# Patient Record
Sex: Female | Born: 1955
Health system: Southern US, Community
[De-identification: ages and names within clinical notes are randomized; demographics above are authoritative.]

## PROBLEM LIST (undated history)

## (undated) DIAGNOSIS — F419 Anxiety disorder, unspecified: Secondary | ICD-10-CM

## (undated) DIAGNOSIS — Z8719 Personal history of other diseases of the digestive system: Secondary | ICD-10-CM

## (undated) DIAGNOSIS — T8859XA Other complications of anesthesia, initial encounter: Secondary | ICD-10-CM

## (undated) DIAGNOSIS — Z8711 Personal history of peptic ulcer disease: Secondary | ICD-10-CM

## (undated) DIAGNOSIS — M5136 Other intervertebral disc degeneration, lumbar region: Secondary | ICD-10-CM

## (undated) DIAGNOSIS — Z923 Personal history of irradiation: Secondary | ICD-10-CM

## (undated) DIAGNOSIS — N816 Rectocele: Secondary | ICD-10-CM

## (undated) DIAGNOSIS — E039 Hypothyroidism, unspecified: Secondary | ICD-10-CM

## (undated) DIAGNOSIS — I10 Essential (primary) hypertension: Secondary | ICD-10-CM

## (undated) DIAGNOSIS — T83711A Erosion of implanted vaginal mesh and other prosthetic materials to surrounding organ or tissue, initial encounter: Secondary | ICD-10-CM

## (undated) DIAGNOSIS — C50919 Malignant neoplasm of unspecified site of unspecified female breast: Secondary | ICD-10-CM

## (undated) DIAGNOSIS — E119 Type 2 diabetes mellitus without complications: Secondary | ICD-10-CM

## (undated) DIAGNOSIS — M797 Fibromyalgia: Secondary | ICD-10-CM

## (undated) DIAGNOSIS — T4145XA Adverse effect of unspecified anesthetic, initial encounter: Secondary | ICD-10-CM

## (undated) DIAGNOSIS — M51369 Other intervertebral disc degeneration, lumbar region without mention of lumbar back pain or lower extremity pain: Secondary | ICD-10-CM

## (undated) DIAGNOSIS — K219 Gastro-esophageal reflux disease without esophagitis: Secondary | ICD-10-CM

## (undated) HISTORY — PX: CARDIOVASCULAR STRESS TEST: SHX262

## (undated) HISTORY — PX: VEIN BYPASS SURGERY: SHX833

## (undated) HISTORY — DX: Malignant neoplasm of unspecified site of unspecified female breast: C50.919

## (undated) HISTORY — PX: INCONTINENCE SURGERY: SHX676

---

## 1983-11-28 HISTORY — PX: OTHER SURGICAL HISTORY: SHX169

## 1998-11-27 HISTORY — PX: VAGINAL HYSTERECTOMY: SUR661

## 2001-11-27 HISTORY — PX: OTHER SURGICAL HISTORY: SHX169

## 2004-11-29 ENCOUNTER — Emergency Department (HOSPITAL_COMMUNITY): Admission: EM | Admit: 2004-11-29 | Discharge: 2004-11-29 | Payer: Self-pay | Admitting: Emergency Medicine

## 2005-07-18 ENCOUNTER — Other Ambulatory Visit: Admission: RE | Admit: 2005-07-18 | Discharge: 2005-07-18 | Payer: Self-pay | Admitting: Gynecology

## 2009-12-23 ENCOUNTER — Ambulatory Visit (HOSPITAL_COMMUNITY): Admission: RE | Admit: 2009-12-23 | Discharge: 2009-12-23 | Payer: Self-pay | Admitting: Internal Medicine

## 2010-08-23 ENCOUNTER — Ambulatory Visit (HOSPITAL_COMMUNITY): Admission: RE | Admit: 2010-08-23 | Discharge: 2010-08-23 | Payer: Self-pay | Admitting: Urology

## 2010-08-29 ENCOUNTER — Ambulatory Visit (HOSPITAL_BASED_OUTPATIENT_CLINIC_OR_DEPARTMENT_OTHER): Admission: RE | Admit: 2010-08-29 | Discharge: 2010-08-29 | Payer: Self-pay | Admitting: Urology

## 2010-08-29 HISTORY — PX: OTHER SURGICAL HISTORY: SHX169

## 2011-02-09 LAB — POCT I-STAT, CHEM 8
BUN: 11 mg/dL (ref 6–23)
Calcium, Ion: 1.17 mmol/L (ref 1.12–1.32)
Hemoglobin: 14.6 g/dL (ref 12.0–15.0)
Potassium: 3 mEq/L — ABNORMAL LOW (ref 3.5–5.1)

## 2011-09-21 DIAGNOSIS — R159 Full incontinence of feces: Secondary | ICD-10-CM | POA: Insufficient documentation

## 2011-09-21 DIAGNOSIS — IMO0001 Reserved for inherently not codable concepts without codable children: Secondary | ICD-10-CM | POA: Insufficient documentation

## 2011-09-21 DIAGNOSIS — IMO0002 Reserved for concepts with insufficient information to code with codable children: Secondary | ICD-10-CM | POA: Insufficient documentation

## 2011-09-21 DIAGNOSIS — F529 Unspecified sexual dysfunction not due to a substance or known physiological condition: Secondary | ICD-10-CM | POA: Insufficient documentation

## 2012-08-09 ENCOUNTER — Other Ambulatory Visit: Payer: Self-pay | Admitting: Obstetrics and Gynecology

## 2012-09-18 ENCOUNTER — Emergency Department (HOSPITAL_COMMUNITY): Payer: No Typology Code available for payment source

## 2012-09-18 ENCOUNTER — Encounter (HOSPITAL_COMMUNITY): Payer: Self-pay

## 2012-09-18 ENCOUNTER — Emergency Department (HOSPITAL_COMMUNITY)
Admission: EM | Admit: 2012-09-18 | Discharge: 2012-09-18 | Disposition: A | Discharge status: Home / Self Care | Payer: No Typology Code available for payment source | Attending: Emergency Medicine | Admitting: Emergency Medicine

## 2012-09-18 DIAGNOSIS — I10 Essential (primary) hypertension: Secondary | ICD-10-CM | POA: Insufficient documentation

## 2012-09-18 DIAGNOSIS — Z8639 Personal history of other endocrine, nutritional and metabolic disease: Secondary | ICD-10-CM | POA: Insufficient documentation

## 2012-09-18 DIAGNOSIS — Y9389 Activity, other specified: Secondary | ICD-10-CM | POA: Insufficient documentation

## 2012-09-18 DIAGNOSIS — Z862 Personal history of diseases of the blood and blood-forming organs and certain disorders involving the immune mechanism: Secondary | ICD-10-CM | POA: Insufficient documentation

## 2012-09-18 DIAGNOSIS — S060X1A Concussion with loss of consciousness of 30 minutes or less, initial encounter: Secondary | ICD-10-CM | POA: Insufficient documentation

## 2012-09-18 DIAGNOSIS — S0990XA Unspecified injury of head, initial encounter: Secondary | ICD-10-CM

## 2012-09-18 DIAGNOSIS — Z8739 Personal history of other diseases of the musculoskeletal system and connective tissue: Secondary | ICD-10-CM | POA: Insufficient documentation

## 2012-09-18 HISTORY — DX: Essential (primary) hypertension: I10

## 2012-09-18 HISTORY — DX: Fibromyalgia: M79.7

## 2012-09-18 MED ORDER — ACETAMINOPHEN 325 MG PO TABS
650.0000 mg | ORAL_TABLET | Freq: Once | ORAL | Status: AC
  Administered 2012-09-18: 650 mg via ORAL
  Filled 2012-09-18: qty 2

## 2012-09-18 NOTE — ED Notes (Addendum)
Pt log-rolled off backboard with assistance of 3 RNs. C-collar left in place.

## 2012-09-18 NOTE — ED Notes (Signed)
C-collar taken off by MD.

## 2012-09-18 NOTE — ED Notes (Signed)
MD at bedside. 

## 2012-09-18 NOTE — ED Notes (Signed)
Pt reporting back to left side of her forehead, neck and lower back.
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
## 2013-02-07 ENCOUNTER — Other Ambulatory Visit: Payer: Self-pay | Admitting: Family Medicine

## 2013-02-07 DIAGNOSIS — M542 Cervicalgia: Secondary | ICD-10-CM

## 2013-02-10 ENCOUNTER — Other Ambulatory Visit: Payer: Self-pay | Admitting: Family Medicine

## 2013-02-10 DIAGNOSIS — M545 Low back pain, unspecified: Secondary | ICD-10-CM

## 2013-02-10 DIAGNOSIS — M542 Cervicalgia: Secondary | ICD-10-CM

## 2013-02-15 ENCOUNTER — Ambulatory Visit
Admission: RE | Admit: 2013-02-15 | Discharge: 2013-02-15 | Disposition: A | Discharge status: Home / Self Care | Payer: Self-pay | Source: Ambulatory Visit | Attending: Family Medicine | Admitting: Family Medicine

## 2013-02-15 DIAGNOSIS — M545 Low back pain, unspecified: Secondary | ICD-10-CM

## 2013-02-15 DIAGNOSIS — M542 Cervicalgia: Secondary | ICD-10-CM

## 2013-06-06 ENCOUNTER — Ambulatory Visit (INDEPENDENT_AMBULATORY_CARE_PROVIDER_SITE_OTHER): Payer: Medicare Other | Admitting: Internal Medicine

## 2013-06-06 VITALS — BP 112/73 | HR 72 | Ht 63.0 in | Wt 153.0 lb

## 2013-06-06 DIAGNOSIS — R0789 Other chest pain: Secondary | ICD-10-CM

## 2013-06-06 DIAGNOSIS — R0602 Shortness of breath: Secondary | ICD-10-CM

## 2013-06-06 NOTE — Patient Instructions (Addendum)
Your physician has requested that you have an echocardiogram. Echocardiography is a painless test that uses sound waves to create images of your heart. It provides your doctor with information about the size and shape of your heart and how well your heart's chambers and valves are working. This procedure takes approximately one hour. There are no restrictions for this procedure.  Your physician recommends that you continue on your current medications as directed. Please refer to the Current Medication list given to you today.  

## 2013-06-09 NOTE — Progress Notes (Signed)
HPI Patient is a 15 yho who is referred by Dr Oneta Rack for evaluation of CP The patient has no history of CAD  She says her CP comes and goes  Does feel light her chest is tight.  Accomp by some SOB  No definite association with activity Also complains fo legs/calves cramping and sore.  This started near end of year.  Her legs when layiing down wll Allergies  Allergen Reactions  . Shellfish Allergy Diarrhea and Nausea And Vomiting    Current Outpatient Prescriptions  Medication Sig Dispense Refill  . ALPRAZolam (XANAX) 0.5 MG tablet Take 0.5 mg by mouth at bedtime as needed for sleep.      . B Complex-C (SUPER B COMPLEX PO) Take 1 tablet by mouth continuous dialysis.      . Cholecalciferol (VITAMIN D-3) 5000 UNITS TABS Take 1 tablet by mouth daily.      . folic acid (FOLVITE) 800 MCG tablet 2 tabs po qd      . hydrochlorothiazide (HYDRODIURIL) 25 MG tablet Take 12.5 mg by mouth daily.       Marland Kitchen levothyroxine (SYNTHROID, LEVOTHROID) 88 MCG tablet Take 44-88 mcg by mouth daily. Take half a tablet Monday, Wednesday and Friday. Take whole tablet all other days      . meloxicam (MOBIC) 7.5 MG tablet Take 7.5 mg by mouth daily.      . potassium chloride SA (K-DUR,KLOR-CON) 20 MEQ tablet Take 20 mEq by mouth as needed.       . ranitidine (ZANTAC) 300 MG capsule 1 or 2 tabs po qd      . rosuvastatin (CRESTOR) 20 MG tablet Take 10 mg by mouth daily.      . triazolam (HALCION) 0.125 MG tablet Take 0.125 mg by mouth at bedtime as needed. For insomnia       No current facility-administered medications for this visit.    Past Medical History  Diagnosis Date  . Hypertension   . Thyroid disease   . Fibromyalgia     Past Surgical History  Procedure Laterality Date  . Bladder surgery      No family history on file.  History   Social History  . Marital Status: Married    Spouse Name: N/A    Number of Children: N/A  . Years of Education: N/A   Occupational History  . Not on file.    Social History Main Topics  . Smoking status: Never Smoker   . Smokeless tobacco: Not on file  . Alcohol Use: No  . Drug Use:   . Sexually Active:    Other Topics Concern  . Not on file   Social History Narrative  . No narrative on file    Review of Systems:  All systems reviewed.  They are negative to the above problem except as previously stated.  Vital Signs: BP 112/73  Pulse 72  Ht 5\' 3"  (1.6 m)  Wt 153 lb (69.4 kg)  BMI 27.11 kg/m2  Physical Exam Patient is in NAD HEENT:  Normocephalic, atraumatic. EOMI, PERRLA.  Neck: JVP is normal.  No bruits.  Lungs: clear to auscultation. No rales no wheezes.  Heart: Regular rate and rhythm. Normal S1, S2. No S3.   No significant murmurs. PMI not displaced.  Abdomen:  Supple, nontender. Normal bowel sounds. No masses. No hepatomegaly.  Extremities:   Good distal pulses throughout. No lower extremity edema.  Musculoskeletal :moving all extremities.  Neuro:   alert and oriented x3.  CN II-XII  grossly intact.   Assessment and Plan:  1.  Chest pain.  I am not convinced pain represent angina.  With SOB I would recomm starting with an echo to evaluate  2.  HTN   GOod control  3.  Lipids  LDL in Jan was 113, Trig 169.  Follow

## 2013-06-16 ENCOUNTER — Ambulatory Visit (HOSPITAL_COMMUNITY): Payer: Medicare Other | Attending: Cardiology | Admitting: Radiology

## 2013-06-16 DIAGNOSIS — R0989 Other specified symptoms and signs involving the circulatory and respiratory systems: Secondary | ICD-10-CM | POA: Insufficient documentation

## 2013-06-16 DIAGNOSIS — R0609 Other forms of dyspnea: Secondary | ICD-10-CM | POA: Insufficient documentation

## 2013-06-16 DIAGNOSIS — R0602 Shortness of breath: Secondary | ICD-10-CM

## 2013-06-16 DIAGNOSIS — R0789 Other chest pain: Secondary | ICD-10-CM | POA: Insufficient documentation

## 2013-06-16 DIAGNOSIS — I10 Essential (primary) hypertension: Secondary | ICD-10-CM | POA: Insufficient documentation

## 2013-06-16 HISTORY — PX: TRANSTHORACIC ECHOCARDIOGRAM: SHX275

## 2013-06-16 NOTE — Progress Notes (Signed)
Echocardiogram performed.  

## 2013-06-25 ENCOUNTER — Telehealth: Payer: Self-pay | Admitting: *Deleted

## 2013-06-25 DIAGNOSIS — R079 Chest pain, unspecified: Secondary | ICD-10-CM

## 2013-06-25 DIAGNOSIS — R0600 Dyspnea, unspecified: Secondary | ICD-10-CM

## 2013-06-25 NOTE — Telephone Encounter (Signed)
I spoke with pt this morning about her echo results. She has been scheduled for a stress myoview on 07/02/13 at 9:30am Pt is aware Mylo Red RN

## 2013-06-25 NOTE — Telephone Encounter (Signed)
Message copied by Barrie Folk on Wed Jun 25, 2013 10:22 AM ------      Message from: Dietrich Pates V      Created: Wed Jun 25, 2013  9:01 AM       Echo is overall normal.        I would recomm setting up for stress myoview to make sure that CP/SOB not related to blood flow problem.      Patient should be set up for exercise myoview. ------

## 2013-07-02 ENCOUNTER — Ambulatory Visit (HOSPITAL_COMMUNITY): Payer: Medicare Other | Attending: Cardiology | Admitting: Radiology

## 2013-07-02 ENCOUNTER — Other Ambulatory Visit (HOSPITAL_COMMUNITY): Payer: Self-pay | Admitting: Internal Medicine

## 2013-07-02 VITALS — BP 147/86 | HR 65 | Ht 63.0 in | Wt 152.0 lb

## 2013-07-02 DIAGNOSIS — I739 Peripheral vascular disease, unspecified: Secondary | ICD-10-CM | POA: Insufficient documentation

## 2013-07-02 DIAGNOSIS — R55 Syncope and collapse: Secondary | ICD-10-CM | POA: Insufficient documentation

## 2013-07-02 DIAGNOSIS — Z8249 Family history of ischemic heart disease and other diseases of the circulatory system: Secondary | ICD-10-CM | POA: Insufficient documentation

## 2013-07-02 DIAGNOSIS — I10 Essential (primary) hypertension: Secondary | ICD-10-CM | POA: Insufficient documentation

## 2013-07-02 DIAGNOSIS — R110 Nausea: Secondary | ICD-10-CM | POA: Insufficient documentation

## 2013-07-02 DIAGNOSIS — R5383 Other fatigue: Secondary | ICD-10-CM | POA: Insufficient documentation

## 2013-07-02 DIAGNOSIS — R5381 Other malaise: Secondary | ICD-10-CM | POA: Insufficient documentation

## 2013-07-02 DIAGNOSIS — R002 Palpitations: Secondary | ICD-10-CM | POA: Insufficient documentation

## 2013-07-02 DIAGNOSIS — R0989 Other specified symptoms and signs involving the circulatory and respiratory systems: Secondary | ICD-10-CM | POA: Insufficient documentation

## 2013-07-02 DIAGNOSIS — R42 Dizziness and giddiness: Secondary | ICD-10-CM | POA: Insufficient documentation

## 2013-07-02 DIAGNOSIS — Z87891 Personal history of nicotine dependence: Secondary | ICD-10-CM | POA: Insufficient documentation

## 2013-07-02 DIAGNOSIS — R079 Chest pain, unspecified: Secondary | ICD-10-CM | POA: Insufficient documentation

## 2013-07-02 DIAGNOSIS — R0609 Other forms of dyspnea: Secondary | ICD-10-CM | POA: Insufficient documentation

## 2013-07-02 DIAGNOSIS — R0602 Shortness of breath: Secondary | ICD-10-CM

## 2013-07-02 DIAGNOSIS — R0600 Dyspnea, unspecified: Secondary | ICD-10-CM

## 2013-07-02 DIAGNOSIS — R109 Unspecified abdominal pain: Secondary | ICD-10-CM

## 2013-07-02 MED ORDER — TECHNETIUM TC 99M SESTAMIBI GENERIC - CARDIOLITE
10.0000 | Freq: Once | INTRAVENOUS | Status: AC | PRN
  Administered 2013-07-02: 10 via INTRAVENOUS

## 2013-07-02 MED ORDER — TECHNETIUM TC 99M SESTAMIBI GENERIC - CARDIOLITE
30.0000 | Freq: Once | INTRAVENOUS | Status: AC | PRN
  Administered 2013-07-02: 30 via INTRAVENOUS

## 2013-07-02 NOTE — Progress Notes (Signed)
[facility] SITE 3 NUCLEAR MED [address] [place], [place] [address] [phone]    Cardiology Nuclear Med Study  Desiree Anderson is a 57 y.o. female     MRN : [id number]     DOB: 05-Jun-1956  Procedure Date: 07/02/2013  Nuclear Med Background Indication for Stress Test:  Evaluation for Ischemia History:  06/16/13 Echo:EF=60% Cardiac Risk Factors: Claudication, Family History - CAD, History of Smoking, Hypertension and Lipids  Symptoms:  Chest Tightness with and without Exertion (last episode of chest discomfort was yesterday), Diaphoresis, Dizziness, DOE, Fatigue, Nausea, Near Syncope, Palpitations and Rapid HR   Nuclear Pre-Procedure Caffeine/Decaff Intake:  None NPO After: 6 pm   Lungs:  Clear. O2 Sat: 97% on room air. IV 0.9% NS with Angio Cath:  22g  IV Site: R Antecubital  IV Started by:  Bonnita Levan, RN  Chest Size (in):  36 Cup Size: DDDD  Height: 5\' 3"  (1.6 m)  Weight:  152 lb (68.947 kg)  BMI:  Body mass index is 26.93 kg/(m^2). Tech Comments:  N/A    Nuclear Med Study 1 or 2 day study: 1 day  Stress Test Type:  Stress  Reading MD: Marca Ancona, MD  Order Authorizing Provider:  Dietrich Pates, MD  Resting Radionuclide: Technetium 37m Sestamibi  Resting Radionuclide Dose: 11.0 mCi   Stress Radionuclide:  Technetium 13m Sestamibi  Stress Radionuclide Dose: 33.0 mCi           Stress Protocol Rest HR: 82 Stress HR: 146  Rest BP: 125/88 Stress BP: 168/91  Exercise Time (min): 7:00 METS: 7.0   Predicted Max HR: 165 bpm % Max HR: 88.48 bpm Rate Pressure Product: 21308   Dose of Adenosine (mg):  n/a Dose of Lexiscan: n/a mg  Dose of Atropine (mg): n/a Dose of Dobutamine: n/a mcg/kg/min (at max HR)  Stress Test Technologist: Smiley Houseman, CMA-N  Nuclear Technologist:  Harlow Asa, CNMT     Rest Procedure:  Myocardial perfusion imaging was performed at rest 45 minutes following the intravenous administration of Technetium 22m Sestamibi.  Rest ECG: NSR  - Normal EKG  Stress Procedure:  The patient exercised on the treadmill utilizing the Bruce Protocol for seven minutes. The patient stopped due to fatigue and denied any chest pain.  Technetium 26m Sestamibi was injected at peak exercise and myocardial perfusion imaging was performed after a brief delay.  Stress ECG: Insignificant upsloping ST segment depression.  QPS Raw Data Images:  Normal; no motion artifact; normal heart/lung ratio. Stress Images:  Small, mild apical septal perfusion defect. Rest Images:  Small,mild apical septal perfusion defect. Subtraction (SDS):  Fixed small, mild apical septal perfusion defect.  Transient Ischemic Dilatation (Normal <1.22):  n/a Lung/Heart Ratio (Normal <0.45):  0.32  Quantitative Gated Spect Images QGS EDV:  39 ml QGS ESV:  10 ml  Impression Exercise Capacity:  Fair exercise capacity. BP Response:  Normal blood pressure response. Clinical Symptoms:  Dyspnea, fatigue.  ECG Impression:  Insignificant upsloping ST segment depression. Comparison with Prior Nuclear Study: No previous nuclear study performed  Overall Impression:  Low risk stress nuclear study with a small, mild apical septal fixed perfusion defect. Given normal wall motion, suspect this represents attenuation. .  LV Ejection Fraction: 74%.  LV Wall Motion:  NL LV Function; NL Wall Motion  Marca Ancona 07/02/2013

## 2013-07-03 ENCOUNTER — Other Ambulatory Visit: Payer: Self-pay | Admitting: Internal Medicine

## 2013-07-03 ENCOUNTER — Ambulatory Visit (HOSPITAL_COMMUNITY)
Admission: RE | Admit: 2013-07-03 | Discharge: 2013-07-03 | Disposition: A | Discharge status: Home / Self Care | Payer: Medicare Other | Source: Ambulatory Visit | Attending: Internal Medicine | Admitting: Internal Medicine

## 2013-07-03 DIAGNOSIS — R110 Nausea: Secondary | ICD-10-CM | POA: Insufficient documentation

## 2013-07-03 DIAGNOSIS — K8020 Calculus of gallbladder without cholecystitis without obstruction: Secondary | ICD-10-CM | POA: Insufficient documentation

## 2013-07-03 DIAGNOSIS — R109 Unspecified abdominal pain: Secondary | ICD-10-CM

## 2013-07-03 DIAGNOSIS — R932 Abnormal findings on diagnostic imaging of liver and biliary tract: Secondary | ICD-10-CM | POA: Insufficient documentation

## 2013-07-04 ENCOUNTER — Ambulatory Visit
Admission: RE | Admit: 2013-07-04 | Discharge: 2013-07-04 | Disposition: A | Discharge status: Home / Self Care | Payer: Medicare Other | Source: Ambulatory Visit | Attending: Internal Medicine | Admitting: Internal Medicine

## 2013-07-04 DIAGNOSIS — R109 Unspecified abdominal pain: Secondary | ICD-10-CM

## 2013-07-04 MED ORDER — IOHEXOL 300 MG/ML  SOLN
100.0000 mL | Freq: Once | INTRAMUSCULAR | Status: AC | PRN
  Administered 2013-07-04: 100 mL via INTRAVENOUS

## 2013-07-09 ENCOUNTER — Telehealth: Payer: Self-pay | Admitting: *Deleted

## 2013-07-09 NOTE — Telephone Encounter (Signed)
Advised patient of myoview

## 2013-07-09 NOTE — Telephone Encounter (Signed)
Message copied by Burnell Blanks on Wed Jul 09, 2013  9:18 AM ------      Message from: Dietrich Pates V      Created: Tue Jul 08, 2013  4:16 PM       Stress test is normal  CP does not appear to be due to blood flow problems to the heart. ------

## 2013-12-04 ENCOUNTER — Other Ambulatory Visit: Payer: Self-pay | Admitting: Internal Medicine

## 2013-12-10 ENCOUNTER — Ambulatory Visit (INDEPENDENT_AMBULATORY_CARE_PROVIDER_SITE_OTHER): Payer: Medicare Other | Admitting: Internal Medicine

## 2013-12-10 ENCOUNTER — Encounter: Payer: Self-pay | Admitting: Internal Medicine

## 2013-12-10 VITALS — BP 124/82 | HR 64 | Temp 98.1°F | Resp 18 | Wt 160.0 lb

## 2013-12-10 DIAGNOSIS — R7309 Other abnormal glucose: Secondary | ICD-10-CM

## 2013-12-10 DIAGNOSIS — B351 Tinea unguium: Secondary | ICD-10-CM

## 2013-12-10 DIAGNOSIS — R7303 Prediabetes: Secondary | ICD-10-CM | POA: Insufficient documentation

## 2013-12-10 DIAGNOSIS — E782 Mixed hyperlipidemia: Secondary | ICD-10-CM

## 2013-12-10 DIAGNOSIS — M797 Fibromyalgia: Secondary | ICD-10-CM

## 2013-12-10 DIAGNOSIS — M1990 Unspecified osteoarthritis, unspecified site: Secondary | ICD-10-CM | POA: Insufficient documentation

## 2013-12-10 DIAGNOSIS — M51369 Other intervertebral disc degeneration, lumbar region without mention of lumbar back pain or lower extremity pain: Secondary | ICD-10-CM

## 2013-12-10 DIAGNOSIS — E559 Vitamin D deficiency, unspecified: Secondary | ICD-10-CM

## 2013-12-10 DIAGNOSIS — M5136 Other intervertebral disc degeneration, lumbar region: Secondary | ICD-10-CM | POA: Insufficient documentation

## 2013-12-10 DIAGNOSIS — I10 Essential (primary) hypertension: Secondary | ICD-10-CM | POA: Insufficient documentation

## 2013-12-10 DIAGNOSIS — Z79899 Other long term (current) drug therapy: Secondary | ICD-10-CM

## 2013-12-10 LAB — HEPATIC FUNCTION PANEL
ALBUMIN: 4 g/dL (ref 3.5–5.2)
ALT: 24 U/L (ref 0–35)
AST: 18 U/L (ref 0–37)
Alkaline Phosphatase: 68 U/L (ref 39–117)
BILIRUBIN TOTAL: 0.4 mg/dL (ref 0.3–1.2)
Bilirubin, Direct: 0.1 mg/dL (ref 0.0–0.3)
Indirect Bilirubin: 0.3 mg/dL (ref 0.0–0.9)
Total Protein: 6.3 g/dL (ref 6.0–8.3)

## 2013-12-10 LAB — CBC WITH DIFFERENTIAL/PLATELET
BASOS PCT: 0 % (ref 0–1)
Basophils Absolute: 0 10*3/uL (ref 0.0–0.1)
EOS PCT: 2 % (ref 0–5)
Eosinophils Absolute: 0.1 10*3/uL (ref 0.0–0.7)
HCT: 39.4 % (ref 36.0–46.0)
Hemoglobin: 13.2 g/dL (ref 12.0–15.0)
LYMPHS ABS: 3.4 10*3/uL (ref 0.7–4.0)
LYMPHS PCT: 48 % — AB (ref 12–46)
MCH: 27.7 pg (ref 26.0–34.0)
MCHC: 33.5 g/dL (ref 30.0–36.0)
MCV: 82.8 fL (ref 78.0–100.0)
MONO ABS: 0.6 10*3/uL (ref 0.1–1.0)
Monocytes Relative: 9 % (ref 3–12)
Neutro Abs: 2.9 10*3/uL (ref 1.7–7.7)
Neutrophils Relative %: 41 % — ABNORMAL LOW (ref 43–77)
PLATELETS: 308 10*3/uL (ref 150–400)
RBC: 4.76 MIL/uL (ref 3.87–5.11)
RDW: 14.3 % (ref 11.5–15.5)
WBC: 7 10*3/uL (ref 4.0–10.5)

## 2013-12-10 LAB — LIPID PANEL
Cholesterol: 177 mg/dL (ref 0–200)
HDL: 59 mg/dL (ref >39)
LDL CALC: 100 mg/dL — AB (ref 0–99)
Total CHOL/HDL Ratio: 3 Ratio
Triglycerides: 92 mg/dL (ref <150)
VLDL: 18 mg/dL (ref 0–40)

## 2013-12-10 LAB — MAGNESIUM: Magnesium: 1.9 mg/dL (ref 1.5–2.5)

## 2013-12-10 LAB — BASIC METABOLIC PANEL WITH GFR
BUN: 14 mg/dL (ref 6–23)
CO2: 31 meq/L (ref 19–32)
CREATININE: 0.52 mg/dL (ref 0.50–1.10)
Calcium: 9.2 mg/dL (ref 8.4–10.5)
Chloride: 101 mEq/L (ref 96–112)
GFR, Est African American: >89 mL/min
GFR, Est Non African American: >89 mL/min
Glucose, Bld: 79 mg/dL (ref 70–99)
Potassium: 3.9 mEq/L (ref 3.5–5.3)
Sodium: 142 mEq/L (ref 135–145)

## 2013-12-10 LAB — TSH: TSH: 3.509 u[IU]/mL (ref 0.350–4.500)

## 2013-12-10 LAB — HEMOGLOBIN A1C
HEMOGLOBIN A1C: 5.8 % — AB (ref <5.7)
Mean Plasma Glucose: 120 mg/dL — ABNORMAL HIGH (ref <117)

## 2013-12-10 MED ORDER — TERBINAFINE HCL 250 MG PO TABS: 250.0000 mg | ORAL_TABLET | Freq: Every day | ORAL | Status: DC

## 2013-12-10 NOTE — Progress Notes (Signed)
Patient ID: Desiree Anderson, female   DOB: 02/27/1956, 58 y.o.   MRN: [id number]   This very nice 58 y.o. MWF presents for 3 month follow up with Hypertension, Hyperlipidemia, Pre-Diabetes and Vitamin D Deficiency.    HTN predates since 2004. BP has been controlled at home. Today's BP: 124/82 mmHg . Patient denies any cardiac type chest pain, palpitations, dyspnea/orthopnea/PND, dizziness, claudication, or dependent edema.   Hyperlipidemia is controlled with diet & meds. Last Cholesterol was 189, Triglycerides were 111, HDL 58 and LDL 109 in Oct 2014 - near goal. Patient denies myalgias or other med SE's.     Also, the patient has history of PreDiabetes with A1c 6.1 % in 04/2010 and with last A1c of 5.6 % in Oct 2014. Patient denies any symptoms of reactive hypoglycemia, diabetic polys, paresthesias or visual blurring.   Other problems include chronic Pain Syndrome due to DJD/DDD w/ chronic LBP, IC  and Fibromyalgia for which patient is on Disability. Today , she also c/o a thickened discolored chalky Rt 1st toenail.   Further, Patient has history of Vitamin D Deficiency  Of 11 in 2008 with last vitamin D of 54 in Oct 2014. Patient supplements vitamin D without any suspected side-effects.    Medication List       This list is accurate as of: 12/10/13  9:45 AM.  Always use your most recent med list.               ALPRAZolam 0.5 MG tablet  Commonly known as:  XANAX  Take 0.5 mg by mouth at bedtime as needed for sleep.     folic acid 151 MCG tablet  Commonly known as:  FOLVITE  2 tabs po qd     hydrochlorothiazide 25 MG tablet  Commonly known as:  HYDRODIURIL  Take 12.5 mg by mouth daily.     levothyroxine 88 MCG tablet  Commonly known as:  SYNTHROID, LEVOTHROID  Take 44-88 mcg by mouth daily. Take half a tablet Monday, Wednesday and Friday, sat and sun Take whole tablet all other days     meloxicam 7.5 MG tablet  Commonly known as:  MOBIC  Take 7.5 mg by mouth daily.     potassium  chloride SA 20 MEQ tablet  Commonly known as:  K-DUR,KLOR-CON  Take 20 mEq by mouth as needed.     ranitidine 300 MG tablet  Commonly known as:  ZANTAC  TAKE ONE TABLET BY MOUTH TWICE DAILY     rosuvastatin 20 MG tablet  Commonly known as:  CRESTOR  Take 10 mg by mouth daily.     sucralfate 1 G tablet  Commonly known as:  CARAFATE     SUPER B COMPLEX PO  Take 1 tablet by mouth continuous dialysis.     triazolam 0.125 MG tablet  Commonly known as:  HALCION  Take 0.125 mg by mouth at bedtime as needed. For insomnia     Vitamin D-3 5000 UNITS Tabs  Take 1 tablet by mouth daily.         Allergies  Allergen Reactions  . Shellfish Allergy Diarrhea and Nausea And Vomiting    PMHx:   Past Medical History  Diagnosis Date  . Hypertension   . Thyroid disease   . Fibromyalgia     FHx:    Reviewed / unchanged  SHx:    Reviewed / unchanged  Systems Review: Constitutional: Denies fever, chills, wt changes, headaches, insomnia, fatigue, night sweats, change in appetite. Eyes: Denies  redness, blurred vision, diplopia, discharge, itchy, watery eyes.  ENT: Denies discharge, congestion, post nasal drip, epistaxis, sore throat, earache, hearing loss, dental pain, tinnitus, vertigo, sinus pain, snoring.  CV: Denies chest pain, palpitations, irregular heartbeat, syncope, dyspnea, diaphoresis, orthopnea, PND, claudication, edema. Respiratory: denies cough, dyspnea, DOE, pleurisy, hoarseness, laryngitis, wheezing.  Gastrointestinal: Denies dysphagia, odynophagia, heartburn, reflux, water brash, abdominal pain or cramps, nausea, vomiting, bloating, diarrhea, constipation, hematemesis, melena, hematochezia,  or hemorrhoids. Genitourinary: Denies dysuria, frequency, urgency, nocturia, hesitancy, discharge, hematuria, flank pain. Musculoskeletal: Denies arthralgias, myalgias, stiffness, jt. swelling, pain, limp, strain/sprain.  Skin: Denies pruritus, rash, hives, warts, acne, eczema, change  in skin lesion(s). Neuro: No weakness, tremor, incoordination, spasms, paresthesia, or pain. Psychiatric: Denies confusion, memory loss, or sensory loss. Endo: Denies change in weight, skin, hair change.  Heme/Lymph: No excessive bleeding, bruising, orenlarged lymph nodes.  Filed Vitals:   12/10/13 0907  BP: 124/82  Pulse: 64  Temp: 98.1 F (36.7 C)  Resp: 18    Estimated body mass index is 28.35 kg/(m^2) as calculated from the following:   Height as of 07/02/13: 5\' 3"  (1.6 m).   Weight as of this encounter: 160 lb (72.576 kg).  On Exam: Appears well nourished - in no distress. Eyes: PERRLA, EOMs, conjunctiva no swelling or erythema. Sinuses: No frontal/maxillary tenderness ENT/Mouth: EAC's clear, TM's nl w/o erythema, bulging. Nares clear w/o erythema, swelling, exudates. Oropharynx clear without erythema or exudates. Oral hygiene is good. Tongue normal, non obstructing. Hearing intact.  Neck: Supple. Thyroid nl. Car 2+/2+ without bruits, nodes or JVD. Chest: Respirations nl with BS clear & equal w/o rales, rhonchi, wheezing or stridor.  Cor: Heart sounds normal w/ regular rate and rhythm without sig. murmurs, gallops, clicks, or rubs. Peripheral pulses normal and equal  without edema.  Abdomen: Soft & bowel sounds normal. Non-tender w/o guarding, rebound, hernias, masses, or organomegaly.  Lymphatics: Unremarkable.  Musculoskeletal: Full ROM all peripheral extremities, joint stability, 5/5 strength, and normal gait.  Skin: Warm, dry without exposed rashes, lesions, ecchymosis apparent. Dystrophic discolored Rt 1st toenail Neuro: Cranial nerves intact, reflexes equal bilaterally. Sensory-motor testing grossly intact. Tendon reflexes grossly intact.  Pysch: Alert & oriented x 3. Insight and judgement nl & appropriate. No ideations.  Assessment and Plan:  1. Hypertension - Continue monitor blood pressure at home. Continue diet/meds same.  2. Hyperlipidemia - Continue diet/meds,  exercise,& lifestyle modifications. Continue monitor periodic cholesterol/liver & renal functions   3. Pre-diabetes/Insulin Resistance - Continue diet, exercise, lifestyle modifications. Monitor appropriate labs.  4. Vitamin D Deficiency - Continue supplementation.  5 DJD/DDD  6. Fibromyalgia  7. Onychomycosis Toenail - Rx Lamisil 250 mg qd x 3 mo & needs 6 wk NV to check HFP   Recommended regular exercise, BP monitoring, weight control, and discussed med and SE's. Recommended labs to assess and monitor clinical status. Further disposition pending results of labs.

## 2013-12-10 NOTE — Patient Instructions (Signed)
Fibromyalgia Fibromyalgia is a disorder that is often misunderstood. It is associated with muscular pains and tenderness that comes and goes. It is often associated with fatigue and sleep disturbances. Though it tends to be long-lasting, fibromyalgia is not life-threatening. CAUSES  The exact cause of fibromyalgia is unknown. People with certain gene types are predisposed to developing fibromyalgia and other conditions. Certain factors can play a role as triggers, such as:  Spine disorders.  Arthritis.  Severe injury (trauma) and other physical stressors.  Emotional stressors. SYMPTOMS   The main symptom is pain and stiffness in the muscles and joints, which can vary over time.  Sleep and fatigue problems. Other related symptoms may include:  Bowel and bladder problems.  Headaches.  Visual problems.  Problems with odors and noises.  Depression or mood changes.  Painful periods (dysmenorrhea).  Dryness of the skin or eyes. DIAGNOSIS  There are no specific tests for diagnosing fibromyalgia. Patients can be diagnosed accurately from the specific symptoms they have. The diagnosis is made by determining that nothing else is causing the problems. TREATMENT  There is no cure. Management includes medicines and an active, healthy lifestyle. The goal is to enhance physical fitness, decrease pain, and improve sleep. HOME CARE INSTRUCTIONS   Only take over-the-counter or prescription medicines as directed by your caregiver. Sleeping pills, tranquilizers, and pain medicines may make your problems worse.  Low-impact aerobic exercise is very important and advised for treatment. At first, it may seem to make pain worse. Gradually increasing your tolerance will overcome this feeling.  Learning relaxation techniques and how to control stress will help you. Biofeedback, visual imagery, hypnosis, muscle relaxation, yoga, and meditation are all options.  Anti-inflammatory medicines and  physical therapy may provide short-term help.  Acupuncture or massage treatments may help.  Take muscle relaxant medicines as suggested by your caregiver.  Avoid stressful situations.  Plan a healthy lifestyle. This includes your diet, sleep, rest, exercise, and friends.  Find and practice a hobby you enjoy.  Join a fibromyalgia support group for interaction, ideas, and sharing advice. This may be helpful. SEEK MEDICAL CARE IF:  You are not having good results or improvement from your treatment. FOR MORE INFORMATION  National Fibromyalgia Association: www.fmaware.[place]: www.arthritis.org Document Released: 11/13/2005 Document Revised: 02/05/2012 Document Reviewed: 02/23/2010 [facility] Patient Information 2014 [place], [place].   Hypertension As your heart beats, it forces blood through your arteries. This force is your blood pressure. If the pressure is too high, it is called hypertension (HTN) or high blood pressure. HTN is dangerous because you may have it and not know it. High blood pressure may mean that your heart has to work harder to pump blood. Your arteries may be narrow or stiff. The extra work puts you at risk for heart disease, stroke, and other problems.  Blood pressure consists of two numbers, a higher number over a lower, 110/72, for example. It is stated as "110 over 72." The ideal is below 120 for the top number (systolic) and under 80 for the bottom (diastolic). Write down your blood pressure today. You should pay close attention to your blood pressure if you have certain conditions such as:  Heart failure.  Prior heart attack.  Diabetes  Chronic kidney disease.  Prior stroke.  Multiple risk factors for heart disease. To see if you have HTN, your blood pressure should be measured while you are seated with your arm held at the level of the heart. It should be measured at least  twice. A one-time elevated blood pressure reading (especially in the  Emergency Department) does not mean that you need treatment. There may be conditions in which the blood pressure is different between your right and left arms. It is important to see your caregiver soon for a recheck. Most people have essential hypertension which means that there is not a specific cause. This type of high blood pressure may be lowered by changing lifestyle factors such as:  Stress.  Smoking.  Lack of exercise.  Excessive weight.  Drug/tobacco/alcohol use.  Eating less salt. Most people do not have symptoms from high blood pressure until it has caused damage to the body. Effective treatment can often prevent, delay or reduce that damage. TREATMENT  When a cause has been identified, treatment for high blood pressure is directed at the cause. There are a large number of medications to treat HTN. These fall into several categories, and your caregiver will help you select the medicines that are best for you. Medications may have side effects. You should review side effects with your caregiver. If your blood pressure stays high after you have made lifestyle changes or started on medicines,   Your medication(s) may need to be changed.  Other problems may need to be addressed.  Be certain you understand your prescriptions, and know how and when to take your medicine.  Be sure to follow up with your caregiver within the time frame advised (usually within two weeks) to have your blood pressure rechecked and to review your medications.  If you are taking more than one medicine to lower your blood pressure, make sure you know how and at what times they should be taken. Taking two medicines at the same time can result in blood pressure that is too low. SEEK IMMEDIATE MEDICAL CARE IF:  You develop a severe headache, blurred or changing vision, or confusion.  You have unusual weakness or numbness, or a faint feeling.  You have severe chest or abdominal pain, vomiting, or breathing  problems. MAKE SURE YOU:   Understand these instructions.  Will watch your condition.  Will get help right away if you are not doing well or get worse. Document Released: 11/13/2005 Document Revised: 02/05/2012 Document Reviewed: 07/03/2008 [facility] Patient Information 2014 [place].  Diabetes and Exercise Exercising regularly is important. It is not just about losing weight. It has many health benefits, such as:  Improving your overall fitness, flexibility, and endurance.  Increasing your bone density.  Helping with weight control.  Decreasing your body fat.  Increasing your muscle strength.  Reducing stress and tension.  Improving your overall health. People with diabetes who exercise gain additional benefits because exercise:  Reduces appetite.  Improves the body's use of blood sugar (glucose).  Helps lower or control blood glucose.  Decreases blood pressure.  Helps control blood lipids (such as cholesterol and triglycerides).  Improves the body's use of the hormone insulin by:  Increasing the body's insulin sensitivity.  Reducing the body's insulin needs.  Decreases the risk for heart disease because exercising:  Lowers cholesterol and triglycerides levels.  Increases the levels of good cholesterol (such as high-density lipoproteins [HDL]) in the body.  Lowers blood glucose levels. YOUR ACTIVITY PLAN  Choose an activity that you enjoy and set realistic goals. Your health care provider or diabetes educator can help you make an activity plan that works for you. You can break activities into 2 or 3 sessions throughout the day. Doing so is as good as one long  session. Exercise ideas include:  Taking the dog for a walk.  Taking the stairs instead of the elevator.  Dancing to your favorite song.  Doing your favorite exercise with a friend. RECOMMENDATIONS FOR EXERCISING WITH TYPE 1 OR TYPE 2 DIABETES   Check your blood glucose before exercising. If  blood glucose levels are greater than 240 mg/dL, check for urine ketones. Do not exercise if ketones are present.  Avoid injecting insulin into areas of the body that are going to be exercised. For example, avoid injecting insulin into:  The arms when playing tennis.  The legs when jogging.  Keep a record of:  Food intake before and after you exercise.  Expected peak times of insulin action.  Blood glucose levels before and after you exercise.  The type and amount of exercise you have done.  Review your records with your health care provider. Your health care provider will help you to develop guidelines for adjusting food intake and insulin amounts before and after exercising.  If you take insulin or oral hypoglycemic agents, watch for signs and symptoms of hypoglycemia. They include:  Dizziness.  Shaking.  Sweating.  Chills.  Confusion.  Drink plenty of water while you exercise to prevent dehydration or heat stroke. Body water is lost during exercise and must be replaced.  Talk to your health care provider before starting an exercise program to make sure it is safe for you. Remember, almost any type of activity is better than none. Document Released: 02/03/2004 Document Revised: 07/16/2013 Document Reviewed: 04/22/2013 [facility] Patient Information 2014 [place]. Cholesterol Cholesterol is a white, waxy, fat-like protein needed by your body in small amounts. The liver makes all the cholesterol you need. It is carried from the liver by the blood through the blood vessels. Deposits (plaque) may build up on blood vessel walls. This makes the arteries narrower and stiffer. Plaque increases the risk for heart attack and stroke. You cannot feel your cholesterol level even if it is very high. The only way to know is by a blood test to check your lipid (fats) levels. Once you know your cholesterol levels, you should keep a record of the test results. Work with your caregiver to  to keep your levels in the desired range. WHAT THE RESULTS MEAN:  Total cholesterol is a rough measure of all the cholesterol in your blood.  LDL is the so-called bad cholesterol. This is the type that deposits cholesterol in the walls of the arteries. You want this level to be low.  HDL is the good cholesterol because it cleans the arteries and carries the LDL away. You want this level to be high.  Triglycerides are fat that the body can either burn for energy or store. High levels are closely linked to heart disease. DESIRED LEVELS:  Total cholesterol below 200.  LDL below 100 for people at risk, below 70 for very high risk.  HDL above 50 is good, above 60 is best.  Triglycerides below 150. HOW TO LOWER YOUR CHOLESTEROL:  Diet.  Choose fish or white meat chicken and [place], roasted or baked. Limit fatty cuts of red meat, fried foods, and processed meats, such as sausage and lunch meat.  Eat lots of fresh fruits and vegetables. Choose whole grains, beans, pasta, potatoes and cereals.  Use only small amounts of olive, corn or canola oils. Avoid butter, mayonnaise, shortening or palm kernel oils. Avoid foods with trans-fats.  Use skim/nonfat milk and low-fat/nonfat yogurt and cheeses. Avoid whole milk,  cream, ice cream, egg yolks and cheeses. Healthy desserts include angel food cake, ginger snaps, animal crackers, hard candy, popsicles, and low-fat/nonfat frozen yogurt. Avoid pastries, cakes, pies and cookies.  Exercise.  A regular program helps decrease LDL and raises HDL.  Helps with weight control.  Do things that increase your activity level like gardening, walking, or taking the stairs.  Medication.  May be prescribed by your caregiver to help lowering cholesterol and the risk for heart disease.  You may need medicine even if your levels are normal if you have several risk factors. HOME CARE INSTRUCTIONS   Follow your diet and exercise programs as suggested by your  caregiver.  Take medications as directed.  Have blood work done when your caregiver feels it is necessary. MAKE SURE YOU:   Understand these instructions.  Will watch your condition.  Will get help right away if you are not doing well or get worse. Document Released: 08/08/2001 Document Revised: 02/05/2012 Document Reviewed: 08/27/2013 [facility] Patient Information 2014 [place], [place].  Vitamin D Deficiency Vitamin D is an important vitamin that your body needs. Having too little of it in your body is called a deficiency. A very bad deficiency can make your bones soft and can cause a condition called rickets.  Vitamin D is important to your body for different reasons, such as:   It helps your body absorb 2 minerals called calcium and phosphorus.  It helps make your bones healthy.  It may prevent some diseases, such as diabetes and multiple sclerosis.  It helps your muscles and heart. You can get vitamin D in several ways. It is a natural part of some foods. The vitamin is also added to some dairy products and cereals. Some people take vitamin D supplements. Also, your body makes vitamin D when you are in the sun. It changes the sun's rays into a form of the vitamin that your body can use. CAUSES   Not eating enough foods that contain vitamin D.  Not getting enough sunlight.  Having certain digestive system diseases that make it hard to absorb vitamin D. These diseases include Crohn's disease, chronic pancreatitis, and cystic fibrosis.  Having a surgery in which part of the stomach or small intestine is removed.  Being obese. Fat cells pull vitamin D out of your blood. That means that obese people may not have enough vitamin D left in their blood and in other body tissues.  Having chronic kidney or liver disease. RISK FACTORS Risk factors are things that make you more likely to develop a vitamin D deficiency. They include:  Being older.  Not being able to get outside very  much.  Living in a nursing home.  Having had broken bones.  Having weak or thin bones (osteoporosis).  Having a disease or condition that changes how your body absorbs vitamin D.  Having dark skin.  Some medicines such as seizure medicines or steroids.  Being overweight or obese. SYMPTOMS Mild cases of vitamin D deficiency may not have any symptoms. If you have a very bad case, symptoms may include:  Bone pain.  Muscle pain.  Falling often.  Broken bones caused by a minor injury, due to osteoporosis. DIAGNOSIS A blood test is the best way to tell if you have a vitamin D deficiency. TREATMENT Vitamin D deficiency can be treated in different ways. Treatment for vitamin D deficiency depends on what is causing it. Options include:  Taking vitamin D supplements.  Taking a calcium supplement. Your caregiver  will suggest what dose is best for you. HOME CARE INSTRUCTIONS  Take any supplements that your caregiver prescribes. Follow the directions carefully. Take only the suggested amount.  Have your blood tested 2 months after you start taking supplements.  Eat foods that contain vitamin D. Healthy choices include:  Fortified dairy products, cereals, or juices. Fortified means vitamin D has been added to the food. Check the label on the package to be sure.  Fatty fish like salmon or trout.  Eggs.  Oysters.  Do not use a tanning bed.  Keep your weight at a healthy level. Lose weight if you need to.  Keep all follow-up appointments. Your caregiver will need to perform blood tests to make sure your vitamin D deficiency is going away. SEEK MEDICAL CARE IF:  You have any questions about your treatment.  You continue to have symptoms of vitamin D deficiency.  You have nausea or vomiting.  You are constipated.  You feel confused.  You have severe abdominal or back pain. MAKE SURE YOU:  Understand these instructions.  Will watch your condition.  Will get help  right away if you are not doing well or get worse. Document Released: 02/05/2012 Document Revised: 03/10/2013 Document Reviewed: 02/05/2012 [facility] Patient Information 2014 [place].

## 2013-12-11 LAB — VITAMIN D 25 HYDROXY (VIT D DEFICIENCY, FRACTURES): VIT D 25 HYDROXY: 95 ng/mL — AB (ref 30–89)

## 2013-12-11 LAB — INSULIN, FASTING: INSULIN FASTING, SERUM: 20 u[IU]/mL (ref 3–28)

## 2013-12-22 ENCOUNTER — Ambulatory Visit: Payer: Self-pay | Admitting: Emergency Medicine

## 2014-01-21 ENCOUNTER — Ambulatory Visit: Payer: Medicare Other | Admitting: *Deleted

## 2014-01-21 DIAGNOSIS — Z79899 Other long term (current) drug therapy: Secondary | ICD-10-CM

## 2014-01-21 LAB — HEPATIC FUNCTION PANEL
ALBUMIN: 4.3 g/dL (ref 3.5–5.2)
ALT: 22 U/L (ref 0–35)
AST: 21 U/L (ref 0–37)
Alkaline Phosphatase: 62 U/L (ref 39–117)
BILIRUBIN INDIRECT: 0.2 mg/dL (ref 0.2–1.2)
Bilirubin, Direct: 0.1 mg/dL (ref 0.0–0.3)
TOTAL PROTEIN: 6.4 g/dL (ref 6.0–8.3)
Total Bilirubin: 0.3 mg/dL (ref 0.2–1.2)

## 2014-01-25 HISTORY — PX: CATARACT EXTRACTION W/ INTRAOCULAR LENS IMPLANT: SHX1309

## 2014-01-28 ENCOUNTER — Other Ambulatory Visit: Payer: Self-pay | Admitting: Internal Medicine

## 2014-01-29 ENCOUNTER — Other Ambulatory Visit: Payer: Self-pay | Admitting: Emergency Medicine

## 2014-01-29 ENCOUNTER — Other Ambulatory Visit: Payer: Self-pay | Admitting: Internal Medicine

## 2014-01-29 MED ORDER — LEVOTHYROXINE SODIUM 88 MCG PO TABS: 44.0000 ug | ORAL_TABLET | Freq: Every day | ORAL | Status: DC

## 2014-01-29 MED ORDER — TRIAZOLAM 0.25 MG PO TABS: 0.2500 mg | ORAL_TABLET | Freq: Every evening | ORAL | Status: DC | PRN

## 2014-02-19 ENCOUNTER — Other Ambulatory Visit: Payer: Self-pay | Admitting: Obstetrics and Gynecology

## 2014-02-24 ENCOUNTER — Other Ambulatory Visit: Payer: Self-pay | Admitting: Urology

## 2014-02-26 ENCOUNTER — Other Ambulatory Visit: Payer: Self-pay | Admitting: Emergency Medicine

## 2014-03-17 ENCOUNTER — Other Ambulatory Visit: Payer: Self-pay | Admitting: Internal Medicine

## 2014-03-17 MED ORDER — FLUTICASONE PROPIONATE 50 MCG/ACT NA SUSP: 2.0000 | Freq: Every day | NASAL | Status: DC

## 2014-03-17 MED ORDER — HYDROCODONE-ACETAMINOPHEN 5-325 MG PO TABS: ORAL_TABLET | ORAL | Status: DC

## 2014-03-19 ENCOUNTER — Encounter (HOSPITAL_BASED_OUTPATIENT_CLINIC_OR_DEPARTMENT_OTHER): Payer: Self-pay | Admitting: *Deleted

## 2014-03-20 ENCOUNTER — Encounter (HOSPITAL_BASED_OUTPATIENT_CLINIC_OR_DEPARTMENT_OTHER): Payer: Self-pay | Admitting: *Deleted

## 2014-03-20 NOTE — Progress Notes (Addendum)
NPO AFTER MN INCLUDES NO DIP TOBACCO. ARRIVE AT 0800.  NEEDS CBC, BMET, AND EKG. WILL TAKE SYNTHROID AND ZANTAC AM DOS W/ SIPS OF WATER. REVIEWED [facility] GUIDELINES, WILL BRING MEDS.

## 2014-03-23 ENCOUNTER — Ambulatory Visit (HOSPITAL_BASED_OUTPATIENT_CLINIC_OR_DEPARTMENT_OTHER): Payer: Medicare Other | Admitting: Anesthesiology

## 2014-03-23 ENCOUNTER — Encounter (HOSPITAL_BASED_OUTPATIENT_CLINIC_OR_DEPARTMENT_OTHER)
Admission: RE | Disposition: A | Discharge status: Home / Self Care | Payer: Self-pay | Source: Ambulatory Visit | Attending: Urology

## 2014-03-23 ENCOUNTER — Encounter (HOSPITAL_BASED_OUTPATIENT_CLINIC_OR_DEPARTMENT_OTHER): Payer: Self-pay | Admitting: *Deleted

## 2014-03-23 ENCOUNTER — Encounter (HOSPITAL_BASED_OUTPATIENT_CLINIC_OR_DEPARTMENT_OTHER): Payer: Medicare Other | Admitting: Anesthesiology

## 2014-03-23 ENCOUNTER — Ambulatory Visit (HOSPITAL_BASED_OUTPATIENT_CLINIC_OR_DEPARTMENT_OTHER)
Admission: RE | Admit: 2014-03-23 | Discharge: 2014-03-24 | Disposition: A | Discharge status: Home / Self Care | Payer: Medicare Other | Source: Ambulatory Visit | Attending: Urology | Admitting: Urology

## 2014-03-23 DIAGNOSIS — Z79899 Other long term (current) drug therapy: Secondary | ICD-10-CM | POA: Insufficient documentation

## 2014-03-23 DIAGNOSIS — Z87440 Personal history of urinary (tract) infections: Secondary | ICD-10-CM | POA: Insufficient documentation

## 2014-03-23 DIAGNOSIS — N3010 Interstitial cystitis (chronic) without hematuria: Secondary | ICD-10-CM | POA: Insufficient documentation

## 2014-03-23 DIAGNOSIS — R159 Full incontinence of feces: Secondary | ICD-10-CM | POA: Insufficient documentation

## 2014-03-23 DIAGNOSIS — I10 Essential (primary) hypertension: Secondary | ICD-10-CM | POA: Insufficient documentation

## 2014-03-23 DIAGNOSIS — Z87891 Personal history of nicotine dependence: Secondary | ICD-10-CM | POA: Insufficient documentation

## 2014-03-23 DIAGNOSIS — Z90710 Acquired absence of both cervix and uterus: Secondary | ICD-10-CM | POA: Insufficient documentation

## 2014-03-23 DIAGNOSIS — E7800 Pure hypercholesterolemia, unspecified: Secondary | ICD-10-CM | POA: Insufficient documentation

## 2014-03-23 DIAGNOSIS — Y831 Surgical operation with implant of artificial internal device as the cause of abnormal reaction of the patient, or of later complication, without mention of misadventure at the time of the procedure: Secondary | ICD-10-CM | POA: Insufficient documentation

## 2014-03-23 DIAGNOSIS — N8111 Cystocele, midline: Secondary | ICD-10-CM | POA: Insufficient documentation

## 2014-03-23 DIAGNOSIS — N393 Stress incontinence (female) (male): Secondary | ICD-10-CM | POA: Insufficient documentation

## 2014-03-23 DIAGNOSIS — K5900 Constipation, unspecified: Secondary | ICD-10-CM | POA: Insufficient documentation

## 2014-03-23 DIAGNOSIS — T85698A Other mechanical complication of other specified internal prosthetic devices, implants and grafts, initial encounter: Secondary | ICD-10-CM | POA: Insufficient documentation

## 2014-03-23 DIAGNOSIS — K219 Gastro-esophageal reflux disease without esophagitis: Secondary | ICD-10-CM | POA: Insufficient documentation

## 2014-03-23 DIAGNOSIS — E039 Hypothyroidism, unspecified: Secondary | ICD-10-CM | POA: Insufficient documentation

## 2014-03-23 DIAGNOSIS — IMO0002 Reserved for concepts with insufficient information to code with codable children: Secondary | ICD-10-CM | POA: Insufficient documentation

## 2014-03-23 DIAGNOSIS — F411 Generalized anxiety disorder: Secondary | ICD-10-CM | POA: Insufficient documentation

## 2014-03-23 HISTORY — DX: Personal history of other diseases of the digestive system: Z87.19

## 2014-03-23 HISTORY — DX: Other intervertebral disc degeneration, lumbar region: M51.36

## 2014-03-23 HISTORY — PX: EXCISION OF MESH: SHX6268

## 2014-03-23 HISTORY — DX: Anxiety disorder, unspecified: F41.9

## 2014-03-23 HISTORY — DX: Other intervertebral disc degeneration, lumbar region without mention of lumbar back pain or lower extremity pain: M51.369

## 2014-03-23 HISTORY — DX: Hypothyroidism, unspecified: E03.9

## 2014-03-23 HISTORY — DX: Rectocele: N81.6

## 2014-03-23 HISTORY — DX: Personal history of peptic ulcer disease: Z87.11

## 2014-03-23 HISTORY — DX: Erosion of implanted vaginal mesh to surrounding organ or tissue, initial encounter: T83.711A

## 2014-03-23 HISTORY — DX: Gastro-esophageal reflux disease without esophagitis: K21.9

## 2014-03-23 LAB — BASIC METABOLIC PANEL
BUN: 10 mg/dL (ref 6–23)
CHLORIDE: 100 meq/L (ref 96–112)
CO2: 28 meq/L (ref 19–32)
CREATININE: 0.64 mg/dL (ref 0.50–1.10)
Calcium: 9.2 mg/dL (ref 8.4–10.5)
GFR calc non Af Amer: >90 mL/min (ref >90)
Glucose, Bld: 91 mg/dL (ref 70–99)
Potassium: 3.8 mEq/L (ref 3.7–5.3)
SODIUM: 141 meq/L (ref 137–147)

## 2014-03-23 LAB — CBC
HEMATOCRIT: 38.7 % (ref 36.0–46.0)
Hemoglobin: 13 g/dL (ref 12.0–15.0)
MCH: 27.8 pg (ref 26.0–34.0)
MCHC: 33.6 g/dL (ref 30.0–36.0)
MCV: 82.9 fL (ref 78.0–100.0)
Platelets: 290 10*3/uL (ref 150–400)
RBC: 4.67 MIL/uL (ref 3.87–5.11)
RDW: 13.7 % (ref 11.5–15.5)
WBC: 7.7 10*3/uL (ref 4.0–10.5)

## 2014-03-23 SURGERY — REMOVAL, MESH, ABDOMEN OR PELVIS
Anesthesia: General

## 2014-03-23 MED ORDER — BUPIVACAINE-EPINEPHRINE 0.5% -1:200000 IJ SOLN
INTRAMUSCULAR | Status: DC | PRN
  Administered 2014-03-23: 31 mL

## 2014-03-23 MED ORDER — OXYBUTYNIN CHLORIDE ER 10 MG PO TB24
10.0000 mg | ORAL_TABLET | Freq: Every day | ORAL | Status: DC
  Administered 2014-03-23: 10 mg via ORAL
  Filled 2014-03-23: qty 1

## 2014-03-23 MED ORDER — PROMETHAZINE HCL 25 MG/ML IJ SOLN
6.2500 mg | INTRAMUSCULAR | Status: DC | PRN
  Filled 2014-03-23: qty 1

## 2014-03-23 MED ORDER — ACETAMINOPHEN 10 MG/ML IV SOLN
INTRAVENOUS | Status: DC | PRN
  Administered 2014-03-23: 1000 mg via INTRAVENOUS

## 2014-03-23 MED ORDER — FENTANYL CITRATE 0.05 MG/ML IJ SOLN
INTRAMUSCULAR | Status: DC | PRN
Start: 2014-03-23 — End: 2014-03-23
  Administered 2014-03-23 (×2): 25 ug via INTRAVENOUS
  Administered 2014-03-23: 50 ug via INTRAVENOUS

## 2014-03-23 MED ORDER — EPHEDRINE SULFATE 50 MG/ML IJ SOLN
INTRAMUSCULAR | Status: DC | PRN
  Administered 2014-03-23: 10 mg via INTRAVENOUS

## 2014-03-23 MED ORDER — PROPOFOL 10 MG/ML IV BOLUS
INTRAVENOUS | Status: DC | PRN
  Administered 2014-03-23: 170 mg via INTRAVENOUS

## 2014-03-23 MED ORDER — KETOROLAC TROMETHAMINE 30 MG/ML IJ SOLN
INTRAMUSCULAR | Status: DC | PRN
  Administered 2014-03-23: 30 mg via INTRAVENOUS

## 2014-03-23 MED ORDER — HYDROMORPHONE HCL PF 1 MG/ML IJ SOLN
INTRAMUSCULAR | Status: AC
  Filled 2014-03-23: qty 1

## 2014-03-23 MED ORDER — KETOROLAC TROMETHAMINE 30 MG/ML IJ SOLN
15.0000 mg | Freq: Once | INTRAMUSCULAR | Status: AC | PRN
  Filled 2014-03-23: qty 1

## 2014-03-23 MED ORDER — PHENAZOPYRIDINE HCL 100 MG PO TABS
ORAL_TABLET | ORAL | Status: AC
  Filled 2014-03-23: qty 1

## 2014-03-23 MED ORDER — ONDANSETRON HCL 4 MG/2ML IJ SOLN
INTRAMUSCULAR | Status: DC | PRN
  Administered 2014-03-23: 4 mg via INTRAVENOUS

## 2014-03-23 MED ORDER — PHENAZOPYRIDINE HCL 200 MG PO TABS
200.0000 mg | ORAL_TABLET | Freq: Once | ORAL | Status: AC
Start: 2014-03-23 — End: 2014-03-23
  Administered 2014-03-23: 200 mg via ORAL
  Filled 2014-03-23: qty 1

## 2014-03-23 MED ORDER — CIPROFLOXACIN HCL 500 MG PO TABS
ORAL_TABLET | ORAL | Status: AC
  Filled 2014-03-23: qty 1

## 2014-03-23 MED ORDER — ATORVASTATIN CALCIUM 10 MG PO TABS
10.0000 mg | ORAL_TABLET | Freq: Every day | ORAL | Status: DC
  Filled 2014-03-23: qty 1

## 2014-03-23 MED ORDER — FAMOTIDINE 10 MG PO TABS
10.0000 mg | ORAL_TABLET | Freq: Every day | ORAL | Status: DC
  Filled 2014-03-23: qty 1

## 2014-03-23 MED ORDER — SENNOSIDES-DOCUSATE SODIUM 8.6-50 MG PO TABS
2.0000 | ORAL_TABLET | Freq: Every day | ORAL | Status: DC
  Filled 2014-03-23: qty 2

## 2014-03-23 MED ORDER — LIDOCAINE HCL (CARDIAC) 20 MG/ML IV SOLN
INTRAVENOUS | Status: DC | PRN
  Administered 2014-03-23: 50 mg via INTRAVENOUS

## 2014-03-23 MED ORDER — DEXAMETHASONE SODIUM PHOSPHATE 4 MG/ML IJ SOLN
INTRAMUSCULAR | Status: DC | PRN
  Administered 2014-03-23: 10 mg via INTRAVENOUS

## 2014-03-23 MED ORDER — SODIUM CHLORIDE 0.9 % IR SOLN
Status: DC | PRN
  Administered 2014-03-23: 11:00:00

## 2014-03-23 MED ORDER — CEFAZOLIN SODIUM-DEXTROSE 2-3 GM-% IV SOLR
2.0000 g | INTRAVENOUS | Status: AC
  Administered 2014-03-23: 2 g via INTRAVENOUS
  Filled 2014-03-23: qty 50

## 2014-03-23 MED ORDER — ALPRAZOLAM 0.5 MG PO TABS
0.5000 mg | ORAL_TABLET | Freq: Three times a day (TID) | ORAL | Status: DC | PRN
  Filled 2014-03-23: qty 1

## 2014-03-23 MED ORDER — MIDAZOLAM HCL 5 MG/5ML IJ SOLN
INTRAMUSCULAR | Status: DC | PRN
  Administered 2014-03-23: 2 mg via INTRAVENOUS

## 2014-03-23 MED ORDER — LORATADINE 10 MG PO TABS
10.0000 mg | ORAL_TABLET | Freq: Every day | ORAL | Status: DC
  Filled 2014-03-23: qty 1

## 2014-03-23 MED ORDER — HYDROMORPHONE HCL PF 1 MG/ML IJ SOLN
0.5000 mg | INTRAMUSCULAR | Status: DC | PRN
  Administered 2014-03-23 (×2): 0.25 mg via INTRAVENOUS
  Filled 2014-03-23: qty 1

## 2014-03-23 MED ORDER — TRIAZOLAM 0.25 MG PO TABS
0.2500 mg | ORAL_TABLET | Freq: Every evening | ORAL | Status: DC | PRN
  Filled 2014-03-23: qty 1

## 2014-03-23 MED ORDER — FENTANYL CITRATE 0.05 MG/ML IJ SOLN
INTRAMUSCULAR | Status: AC
  Filled 2014-03-23: qty 6

## 2014-03-23 MED ORDER — MELOXICAM 7.5 MG PO TABS
7.5000 mg | ORAL_TABLET | Freq: Every day | ORAL | Status: DC
  Filled 2014-03-23: qty 1

## 2014-03-23 MED ORDER — SUCRALFATE 1 G PO TABS
1.0000 g | ORAL_TABLET | Freq: Three times a day (TID) | ORAL | Status: DC
  Filled 2014-03-23: qty 1

## 2014-03-23 MED ORDER — FLUTICASONE PROPIONATE 50 MCG/ACT NA SUSP
2.0000 | Freq: Every day | NASAL | Status: DC
  Filled 2014-03-23: qty 16

## 2014-03-23 MED ORDER — OXYBUTYNIN CHLORIDE ER 10 MG PO TB24
ORAL_TABLET | ORAL | Status: AC
  Filled 2014-03-23: qty 1

## 2014-03-23 MED ORDER — MIDAZOLAM HCL 2 MG/2ML IJ SOLN
INTRAMUSCULAR | Status: AC
  Filled 2014-03-23: qty 2

## 2014-03-23 MED ORDER — STERILE WATER FOR IRRIGATION IR SOLN
Status: DC | PRN
  Administered 2014-03-23: 1000 mL

## 2014-03-23 MED ORDER — LEVOTHYROXINE SODIUM 88 MCG PO TABS
44.0000 ug | ORAL_TABLET | Freq: Every day | ORAL | Status: DC
  Filled 2014-03-23: qty 1

## 2014-03-23 MED ORDER — FENTANYL CITRATE 0.05 MG/ML IJ SOLN
25.0000 ug | INTRAMUSCULAR | Status: DC | PRN
  Administered 2014-03-23 (×2): 25 ug via INTRAVENOUS
  Filled 2014-03-23: qty 1

## 2014-03-23 MED ORDER — DIPHENHYDRAMINE HCL 50 MG/ML IJ SOLN
12.5000 mg | Freq: Four times a day (QID) | INTRAMUSCULAR | Status: DC | PRN
  Filled 2014-03-23: qty 0.5

## 2014-03-23 MED ORDER — OXYCODONE-ACETAMINOPHEN 5-325 MG PO TABS
1.0000 | ORAL_TABLET | ORAL | Status: DC | PRN
  Administered 2014-03-24 (×2): 1 via ORAL
  Filled 2014-03-23: qty 2

## 2014-03-23 MED ORDER — LACTATED RINGERS IV SOLN
INTRAVENOUS | Status: DC
  Administered 2014-03-23 (×2): via INTRAVENOUS
  Filled 2014-03-23: qty 1000

## 2014-03-23 MED ORDER — DIPHENHYDRAMINE HCL 12.5 MG/5ML PO ELIX
12.5000 mg | ORAL_SOLUTION | Freq: Four times a day (QID) | ORAL | Status: DC | PRN
  Filled 2014-03-23: qty 10

## 2014-03-23 MED ORDER — BACITRACIN-NEOMYCIN-POLYMYXIN 400-5-5000 EX OINT
1.0000 "application " | TOPICAL_OINTMENT | Freq: Three times a day (TID) | CUTANEOUS | Status: DC | PRN
  Filled 2014-03-23: qty 1

## 2014-03-23 MED ORDER — CIPROFLOXACIN HCL 500 MG PO TABS
500.0000 mg | ORAL_TABLET | Freq: Two times a day (BID) | ORAL | Status: DC
  Administered 2014-03-23 (×2): 500 mg via ORAL
  Filled 2014-03-23: qty 1

## 2014-03-23 MED ORDER — SODIUM CHLORIDE 0.9 % IJ SOLN
INTRAMUSCULAR | Status: DC | PRN
  Administered 2014-03-23: 31 mL via INTRAVENOUS

## 2014-03-23 MED ORDER — ESTRADIOL 0.1 MG/GM VA CREA
TOPICAL_CREAM | VAGINAL | Status: DC | PRN
  Administered 2014-03-23: 1 via VAGINAL

## 2014-03-23 MED ORDER — HYDROCHLOROTHIAZIDE 25 MG PO TABS
6.2500 mg | ORAL_TABLET | Freq: Every morning | ORAL | Status: DC
  Filled 2014-03-23: qty 0.5

## 2014-03-23 MED ORDER — FENTANYL CITRATE 0.05 MG/ML IJ SOLN
INTRAMUSCULAR | Status: AC
  Filled 2014-03-23: qty 2

## 2014-03-23 MED ORDER — POTASSIUM CHLORIDE CRYS ER 20 MEQ PO TBCR
20.0000 meq | EXTENDED_RELEASE_TABLET | Freq: Every day | ORAL | Status: DC
  Filled 2014-03-23: qty 1

## 2014-03-23 MED ORDER — SODIUM CHLORIDE 0.45 % IV SOLN
INTRAVENOUS | Status: DC
  Administered 2014-03-23: 16:00:00 via INTRAVENOUS
  Filled 2014-03-23: qty 1000

## 2014-03-23 MED ORDER — ONDANSETRON HCL 4 MG/2ML IJ SOLN
4.0000 mg | INTRAMUSCULAR | Status: DC | PRN
  Filled 2014-03-23: qty 2

## 2014-03-23 SURGICAL SUPPLY — 61 items
BAG URINE DRAINAGE (UROLOGICAL SUPPLIES) ×2 IMPLANT
BLADE SURG 10 STRL SS (BLADE) ×2 IMPLANT
BLADE SURG 15 STRL LF DISP TIS (BLADE) ×1 IMPLANT
BLADE SURG 15 STRL SS (BLADE) ×1
BLADE SURG ROTATE 9660 (MISCELLANEOUS) ×2 IMPLANT
BOOTIES KNEE HIGH SLOAN (MISCELLANEOUS) ×2 IMPLANT
CANISTER SUCTION 1200CC (MISCELLANEOUS) IMPLANT
CANISTER SUCTION 2500CC (MISCELLANEOUS) ×4 IMPLANT
CATH FOLEY 2WAY SLVR  5CC 16FR (CATHETERS) ×1
CATH FOLEY 2WAY SLVR 5CC 16FR (CATHETERS) ×1 IMPLANT
COVER MAYO STAND STRL (DRAPES) ×2 IMPLANT
COVER TABLE BACK 60X90 (DRAPES) ×2 IMPLANT
DERMABOND ADVANCED (GAUZE/BANDAGES/DRESSINGS)
DERMABOND ADVANCED .7 DNX12 (GAUZE/BANDAGES/DRESSINGS) IMPLANT
DEVICE CAPIO SLIM BOX (INSTRUMENTS) IMPLANT
DISSECTOR ROUND CHERRY 3/8 STR (MISCELLANEOUS) IMPLANT
DRAIN PENROSE 18X1/4 LTX STRL (WOUND CARE) ×2 IMPLANT
DRAPE CAMERA CLOSED 9X96 (DRAPES) ×4 IMPLANT
DRAPE UNDERBUTTOCKS STRL (DRAPE) ×2 IMPLANT
FLOSEAL 10ML (HEMOSTASIS) IMPLANT
GAUZE PACKING 2X5 YD STRL (GAUZE/BANDAGES/DRESSINGS) ×2 IMPLANT
GAUZE SPONGE 4X4 16PLY XRAY LF (GAUZE/BANDAGES/DRESSINGS) IMPLANT
GLOVE BIO SURGEON STRL SZ7.5 (GLOVE) ×2 IMPLANT
GLOVE BIOGEL M 6.5 STRL (GLOVE) ×2 IMPLANT
GLOVE INDICATOR 6.5 STRL GRN (GLOVE) ×2 IMPLANT
GOWN PREVENTION PLUS LG XLONG (DISPOSABLE) ×2 IMPLANT
GOWN STRL REIN XL XLG (GOWN DISPOSABLE) ×2 IMPLANT
GOWN STRL REUS W/TWL LRG LVL3 (GOWN DISPOSABLE) ×2 IMPLANT
GOWN STRL REUS W/TWL XL LVL3 (GOWN DISPOSABLE) ×2 IMPLANT
NEEDLE 1/2 CIR CATGUT .05X1.09 (NEEDLE) IMPLANT
NEEDLE HYPO 22GX1.5 SAFETY (NEEDLE) ×4 IMPLANT
PACKING VAGINAL (PACKING) ×2 IMPLANT
PEN SKIN MARKING BROAD (MISCELLANEOUS) ×2 IMPLANT
PENCIL BUTTON HOLSTER BLD 10FT (ELECTRODE) ×2 IMPLANT
PLUG CATH AND CAP STER (CATHETERS) ×2 IMPLANT
RETRACTOR LONRSTAR 16.6X16.6CM (MISCELLANEOUS) ×1 IMPLANT
RETRACTOR STAY HOOK 5MM (MISCELLANEOUS) ×2 IMPLANT
RETRACTOR STER APS 16.6X16.6CM (MISCELLANEOUS) ×2
SET IRRIG Y TYPE TUR BLADDER L (SET/KITS/TRAYS/PACK) ×4 IMPLANT
SHEET LAVH (DRAPES) ×2 IMPLANT
SLING SOLYX SYSTEM SIS BX (SLING) IMPLANT
SPONGE LAP 4X18 X RAY DECT (DISPOSABLE) ×2 IMPLANT
SUCTION FRAZIER TIP 10 FR DISP (SUCTIONS) ×2 IMPLANT
SUT ABS MONO DBL WITH NDL 48IN (SUTURE) IMPLANT
SUT ETHILON 2 0 PS N (SUTURE) IMPLANT
SUT MON AB 2-0 SH 27 (SUTURE)
SUT MON AB 2-0 SH27 (SUTURE) IMPLANT
SUT NONABSORB MONO DB W/NDL 48 (SUTURE) IMPLANT
SUT PDS AB 3-0 SH 27 (SUTURE) IMPLANT
SUT VIC AB 0 CT1 36 (SUTURE) IMPLANT
SUT VIC AB 2-0 CT1 27 (SUTURE)
SUT VIC AB 2-0 CT1 TAPERPNT 27 (SUTURE) IMPLANT
SUT VIC AB 2-0 UR6 27 (SUTURE) ×16 IMPLANT
SYR BULB IRRIGATION 50ML (SYRINGE) ×2 IMPLANT
SYR CONTROL 10ML LL (SYRINGE) ×2 IMPLANT
SYRINGE 10CC LL (SYRINGE) ×2 IMPLANT
TISSUE REPAIR XENFORM 4X7CM (Tissue) ×2 IMPLANT
TRAY DSU PREP LF (CUSTOM PROCEDURE TRAY) ×2 IMPLANT
TUBE CONNECTING 12X1/4 (SUCTIONS) ×4 IMPLANT
WATER STERILE IRR 500ML POUR (IV SOLUTION) ×4 IMPLANT
YANKAUER SUCT BULB TIP NO VENT (SUCTIONS) IMPLANT

## 2014-03-23 NOTE — Progress Notes (Signed)
Pt. Took home med  triazolam

## 2014-03-23 NOTE — Op Note (Signed)
Pre-operative diagnosis :   Transurethral vaginal mesh TVT sling extrusion and Prolift anterior vaginal mesh extrusion removal  Postoperative  Diagnosis:  same  Operation:  Explantation of extruded TVT transurethral mesh; and explantation of extruded anterior vault Prolift mesh; with Kelly plication and implantation of Xenform biologic tissue for anterior vault repair.   Surgeon:  Chauncey Cruel. Gaynelle Arabian, MD  First assistant:  none  Anesthesia:   General LMA  Preparation:  After appropriate preanesthesia, the patient was brought to the operating room, placed on the operating table in the dorsal supine position where general LMA anesthesia was introduced. She was replaced in the dorsal lithotomy position with pubis was prepped with Betadine solution and draped in usual fashion. Her history was double checked. The arm band was double checked.   Review history:  Problems  1. Chronic interstitial cystitis without hematuria (595.1)   Assessed By: Carolan Clines (Urology); Last Assessed: 23 Feb 2014  2. Dyspareunia, female (625.0)   Assessed By: Carolan Clines (Urology); Last Assessed: 23 Feb 2014  3. Female pelvic pain (625.9)   Assessed By: Carolan Clines (Urology); Last Assessed: 23 Feb 2014  History of Present Illness  58 year old female, last seen 09/04/11, referred back by Dr. Harrington Challenger for further evaluation of bladder pain with intercourse. She complains of frequency & constant nausea. She drinks 8oz of tea per day. She has previous hx of fecal incontinence, evaluated by Dr. Donna Christen (GI) @ [place]. She has a hx of IC, s/p cystoscopy/HOD on 08/29/10. She has been treated with vagisil per Dr. Rockwell Alexandria, and complains of vaginal dryness and dyspaurena.  She has finished physical therapy in Jan. 2013, at which time her bladder pain had decreased/improved.  .  She is s/p bladder tack in 2003 in [facility], and vaginal hysterectomy ( Dr. Grayland Jack, [facility]). Postoperatively, her urge  incontinence continued, and she c/o bladder cough and sneeze incontinence which improved for only 6 months post op. She then suffered a recurrence, and felt a "bulge". Patient underwent a PROLIFT and TVT in [place] ( Dr. Thora Lance [facility]), but developed dyspareunia, with post op problem of irritation of her husband's penis, and eventually she underwent vaginal mesh extrusion repair, with removal of mesh extrusion and treatment a vaginal infection ( [place]). She underwent mesh extrusion repair, and coincidental posterior PROLIFT prolapse repair in [facility] in 2009, with worsening dyspareunia, and the onset of fecal incontinence. She has developed pressure in her pelvis, as well as pelvic pain.  She was seen in [place] by Dr. Harolyn Rutherford with diagnosis of sling extrusion, and went back to [facility] for repair, with removal of extruded sling. She was then seen at [facility] for recurrent UTI and incontinence, with urodynamics on 09/05/10, shows a small capacity, painful bladder. She has stress urinary incontinence, and very low Valsalva leak point pressure. She was able to inhibit her low amplitude contractions, but but complains of pain with leak, and with this unstable contraction. She was able to void voluntarily but showed an obstructive flow pattern. There did appear to be a small lateral tear at the patient's urinary meatus, which appeared to be a scratch.  She then was seen at [facility] by Dr. Orinda Kenner for another opinion, and was told that removal of the mesh would probably mean return of the prolapse. he biggest problem at that time was orgasm recovery, and she has concentrated on that. She now has pain with all vaginal penetration, and she has pain on the  outside of the vagina.    Statement of  Likelihood of Success: Excellent. TIME-OUT observed.:  Procedure:  Vaginal inspection revealed extrusion of mesh on the right side of the urethra. It is noted patient appears he had a TVT  placement, via the retropubic approach. In addition, the patient had severe anterior vaginal vault scar, with mesh extrusion at the apex. Is felt that the mesh may be punched at the apex. Using a blue marking pen, and outlined of proposed incision was then accomplished in a T-shaped fashion, across the midline, encompassing the extruded right-sided periurethral mesh, and then encompassing an ellipse of anterior vaginal vault tissue to the level of the apex, to remove severe scarring, and encompassing an area of extrusion of anterior vault mesh extrusion.  The patient then had Marcaine 25 plain with epinephrine 1 200,000 injected to help with tissue dissection, as well as postop analgesia.  Following this, lines of incision were then incised, and using the Strully scissors, dissection was accomplished carefully. Attention was first directed to the TVT, and this was carefully dissected throughout the right periurethral area, with care taken to avoid injury to the urethra. The mesh was identified, clamped with a Kelly clamp, and dissected to the retropubic space. It was then cut with the curved Mayo scissors, and then dissected across the midline. Incision was carried across midline to the left periurethral area, and again, the sling material was dissected to the retropubic space, and cut with the curved Mayo scissors. This tissue was submitted to the laboratory for examination. Irrigation was accomplished. No bleeding was noted.  The anterior wall was then remarked, so that the area for excision was understood. Using a 15 blade, the anterior wall incisions were made, and a portion of the anterior wall was removed. Using the [facility] scissors, the tissue was removed. Dissection was accomplished, until I found the Prolift mesh, which I felt was bunched at the level of the apex. This tissue was then dissected from the apex towards the distal portion of the vagina, and once a plane was established, I was able to  dissect the mesh off of the bladder. The mesh was then dissected as far as possible lateralward, on both the right and left sides. Was unable to remove all of the wings of the mesh, but used the curved Mayo scissors to extract as much as possible before cutting the wings. This tissue was sent to the laboratory separately. Irrigation was again accomplished. No bleeding was noted. I then selected a 4 cm x 7 cm portion of Xeroform and soaked it in appropriate solution.  Using 3-0 Vicryl suture, a performed a moderate Kelly plication of a grade 2-3 cystocele. This was accomplished with interrupted 2-0 Vicryl sutures. Following this, the 7 cm x 4 cementer portion of Xeroform was then sutured in place in all 4 corners, and lay smoothly against the Kelly plication. It is noted that the [facility] plication suture knots were all brought to the left side, so they would not rub against the biologic tissue. The undermined anterior vaginal wall was then closed without tension using 2-0 Vicryl suture in a running fashion. The periurethral incisions were closed with a 2-0 Vicryl suture as well. A single 2-0 Vicryl suture knot was noted at the apex of the vagina, and the patient weaned notified that this will resolve and fall out in 3 weeks.  Cystourethroscopy was accomplished, showing no suture within the bladder. Clear reflux was seen from both orifices. The trigone was  in normal position. There was minimal tenting of the bladder base. There was no evidence of bladder stone, tumor, or bladder diverticular formation.  The patient received IV Toradol and IV Tylenol. She was awakened, taken to recovery room in good condition.   The specimens were handed off in standard fashion with a specimen time out, to go to pathology for identification. The specimens included TVT mesh, Prolift mesh, and anterior vaginal vault scar. The specimens will be available for the patient.She did not bring any special cases for the handling of the  material for her attorney.

## 2014-03-23 NOTE — Transfer of Care (Signed)
Immediate Anesthesia Transfer of Care Note  Patient: Desiree Anderson  Procedure(s) Performed: Procedure(s) (LRB): EXCISION OF VAGINAL AND PERI-URETHRAL MESH, EXTRACTION FROM URETHRA TO APEX, insertion of XENFORM in the vaginal vault (N/A)  Patient Location: PACU  Anesthesia Type: General  Level of Consciousness: awake, oriented, sedated and patient cooperative  Airway & Oxygen Therapy: Patient Spontanous Breathing and Patient connected to face mask oxygen  Post-op Assessment: Report given to PACU RN and Post -op Vital signs reviewed and stable  Post vital signs: Reviewed and stable  Complications: No apparent anesthesia complications

## 2014-03-23 NOTE — H&P (Signed)
Reason For Visit Bladder pain   Active Problems Problems  1. Chronic interstitial cystitis without hematuria (595.1)   Assessed By: Carolan Clines (Urology); Last Assessed: 23 Feb 2014 2. Dyspareunia, female (625.0)   Assessed By: Carolan Clines (Urology); Last Assessed: 23 Feb 2014 3. Female pelvic pain (625.9)   Assessed By: Carolan Clines (Urology); Last Assessed: 23 Feb 2014  History of Present Illness    58 year old female, last seen 09/04/11, referred back by Dr. Harrington Challenger for further evaluation of bladder pain with intercourse. She complains of frequency & constant nausea. She drinks 8oz of tea per day. She has previous hx of fecal incontinence, evaluated by Dr. Donna Christen (GI) @ [facility]. She has a hx of IC, s/p cystoscopy/HOD on 08/29/10. She has been treated with vagisil per Dr. Rockwell Alexandria, and complains of vaginal dryness and dyspaurena.      She has finished physical therapy in Jan. 2013, at which time her bladder pain had decreased/improved.   .    She is s/p bladder tack in 2003 in [facility], and vaginal hysterectomy ( Dr. Grayland Jack, [facility]). Postoperatively, her urge incontinence continued, and she c/o bladder cough and sneeze incontinence which improved for only 6 months post op.  She then suffered a recurrence, and felt a "bulge". Patient underwent a PROLIFT and TVT in [place] ( Dr. Thora Lance [facility]), but developed dyspareunia, with post op problem of irritation of her husband's penis, and eventually she underwent vaginal mesh extrusion repair, with removal of mesh extrusion and treatment a vaginal infection ( [place]). She underwent mesh extrusion repair, and coincidental posterior PROLIFT prolapse repair in [facility] in 2009, with worsening dyspareunia, and the onset of fecal incontinence. She has developed pressure in her pelvis, as well as pelvic pain.      She was seen in [place] by Dr. Harolyn Rutherford with diagnosis of sling extrusion, and  went back to [facility] for repair, with removal of extruded sling. She was then seen at [facility] for recurrent UTI and incontinence, with urodynamics on 09/05/10, shows a small capacity, painful bladder. She has stress urinary incontinence, and very low Valsalva leak point pressure. She was able to inhibit her low amplitude contractions, but but complains of pain with leak, and with this unstable contraction. She was able to void voluntarily but showed an obstructive flow pattern. There did appear to be a small lateral tear at the patient's urinary meatus, which appeared to be a scratch.    She then was seen at [facility] by Dr. Orinda Kenner for another opinion, and was told that removal of the mesh would probably mean return of the prolapse. he biggest problem at that time was orgasm recovery, and she has concentrated on that. She now has pain with all vaginal penetration, and she has pain on the outside of the vagina.   Past Medical History Problems  1. History of Anxiety (300.00) 2. History of esophageal reflux (V12.79) 3. History of hypercholesterolemia (V12.29) 4. History of Hyperthyroidism (242.90)  Surgical History Problems  1. History of Anterior Colporrhaphy, Repair Of Cystocele 2. History of Bladder Irrigation 3. History of Bladder Surgery 4. History of Cataract Surgery 5. History of Cystoscopy With Biopsy 6. History of Cystoscopy With Dilation Of Bladder 7. History of Rectocele Repair  Current Meds 1. ALPRAZolam 0.5 MG Oral Tablet;  Therapy: (Recorded:19Sep2011) to Recorded 2. Crestor TABS;  Therapy: (Recorded:30Mar2015) to Recorded 3. Cyclobenzaprine HCl - 10 MG Oral Tablet;  Therapy: [id number] to Recorded 4.  Diazepam 10 MG Oral Tablet; Use 1 20 mg suppository 2x/day;  Therapy: [id number] to (Evaluate:05Feb2013); Last YS:[id number] Ordered 5. Elmiron 100 MG Oral Capsule; TAKE ONE CAPSULE BY MOUTH THREE TIMES DAILY;  Therapy: 24Oct2011 to (Evaluate:23Aug2012); Last Rx:26Jan2012  Ordered 6. Estrace 0.1 MG/GM Vaginal Cream;  Therapy: 25Aug2011 to Recorded 7. Flexeril TABS;  Therapy: (Recorded:19Sep2011) to Recorded 8. Hydrochlorothiazide 25 MG Oral Tablet;  Therapy: (Recorded:19Sep2011) to Recorded 9. Hydrocodone-Acetaminophen TABS;  Therapy: (Recorded:19Sep2011) to Recorded 10. K-Dur 20 MEQ TBCR;   Therapy: (Recorded:19Sep2011) to Recorded 11. Levothyroxine Sodium 88 MCG Oral Tablet;   Therapy: (Recorded:19Sep2011) to Recorded 12. Ranitidine HCl - 300 MG Oral Tablet;   Therapy: (Recorded:19Sep2011) to Recorded 13. Triazolam 0.25 MG Oral Tablet;   Therapy: (Recorded:19Sep2011) to Recorded 14. Vitamin B Complex CAPS;   Therapy: (Recorded:30Mar2015) to Recorded 15. Vitamin D TABS;   Therapy: (Recorded:30Mar2015) to Recorded  Allergies Medication  1. No Known Drug Allergies Non-Medication  2. Fresh Vegetables 3. Shellfish  Family History Problems  1. Family history of Acute Myocardial Infarction (V17.3) : Father 2. Family history of Arthritis (V17.7) : Mother 3. Family history of Congestive Heart Failure : Mother 4. Family history of Diabetes Mellitus (V18.0) : Mother 5. Family history of Family Health Status - Mother's Age   48 6. Family history of Family Health Status Number Of Children   1 son, 1 daughter 27. Family history of Father Deceased At Age ____   46, heart attack 8. Family history of Lung Cancer (V16.1) : Brother 9. Family history of Nephrolithiasis : Mother  Social History Problems  1. Activities Of Daily Living 2. Denied: History of Alcohol Use 3. Denied: History of Caffeine Use 4. Exercise Habits   No active exercise at this time she indicates her activity level can be quite low on some     days when she is having pain her hospital is supporting with assistance with all chores. 5. Former smoker (V15.82)   smoked x 44yrs, quit 15yrs ago 6. Living Independently With Spouse 7. Marital History - Currently Married 8.  Self-reliant In Usual Daily Activities  Review of Systems Genitourinary, constitutional, skin, eye, otolaryngeal, hematologic/lymphatic, cardiovascular, pulmonary, endocrine, musculoskeletal, gastrointestinal, neurological and psychiatric system(s) were reviewed and pertinent findings if present are noted.  Genitourinary: urinary frequency, pelvic pain, dyspareunia and bladder pain.  Gastrointestinal: nausea.    Vitals Vital Signs [Data Includes: Last 1 Day]  Recorded: [id number] 02:18PM  Height: 5 ft 2 in Weight: 158 lb  BMI Calculated: 28.9 BSA Calculated: 1.73 Blood Pressure: 120 / 73 Temperature: 98.1 F Heart Rate: 75  Physical Exam Constitutional: Well nourished and well developed . No acute distress.  ENT:. The ears and nose are normal in appearance.  Neck: The appearance of the neck is normal and no neck mass is present.  Pulmonary: No respiratory distress and normal respiratory rhythm and effort.  Cardiovascular: Heart rate and rhythm are normal . No peripheral edema.  Abdomen: The abdomen is soft and nontender. No masses are palpated. No CVA tenderness. No hernias are palpable. No hepatosplenomegaly noted.  Genitourinary:  Chaperone Present: kim Bobby Rumpf.  Examination of the external genitalia shows normal female external genitalia. There is no urethral mass. Vaginal exam demonstrates tenderness and the vaginal epithelium to be poorly estrogenized. No cystocele is identified. No enterocele is identified. No rectocele is identified. The cervix is is absent. The bladder is tender.  Lymphatics: The femoral and inguinal nodes are not enlarged or tender.  Skin: Normal skin turgor, no  visible rash and no visible skin lesions.  Neuro/Psych:. Mood and affect are appropriate.    Results/Data Urine [Data Includes: Last 1 Day]   [id number]  COLOR STRAW   APPEARANCE CLEAR   SPECIFIC GRAVITY 1.015   pH 5.5   GLUCOSE NEG mg/dL  BILIRUBIN NEG   KETONE NEG mg/dL  BLOOD NEG   PROTEIN  NEG mg/dL  UROBILINOGEN 0.2 mg/dL  NITRITE NEG   LEUKOCYTE ESTERASE NEG    Assessment Assessed  1. Chronic cystitis (595.2) 2. Fecal incontinence alternating with constipation (787.60,564.00) 3. 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 Female pelvic pain (625.9) 5. Dyspareunia, female (625.0) 6. Chronic interstitial cystitis without hematuria (595.1)  ( > 1 hour discussion and review of history, discussion of exam and surgery).  58 yo married female post vaginal mesh placement in [place] in 2003, with revision in 2009, with worsening since. She is part of a LAWSUIT involving vaginal mesh. ( needs to keep tissue for her lawyer). She reports that I have told her in the past that the surgery "looked good", and she believes that her problem is with the mesh, and not how it was put in. She has been seen at [facility] with discussion regarding removal of the mesh, and has been told by Dr. Orinda Kenner, as well as by myself, that removal of the mesh will be a big operation, and that it may require more than 1 surgery, and that I am concerned that she may have incontinence that we may have to deal with in the future.    She has been placed on Estradiol with the help of Dr. Harrington Challenger. Lidocaine burns (vagina). vicodin helped. Xanax has helped. Valium vaginal suppository has helped in the past. She is intolerant of Neurontin.    I have offered her to go to [facility] for another opinion or to have her surgery there, but she wants to have her surgery here, because she wants to have her future Urology and GYN care here.    Her surgery will take 3 hrs, and involve removal of mesh from the mid-urethra to the apex of the anterior vault. We will place Xenform biologic in the wound to help healing ( will promote scar formation), and hopefully we can avoid future prolapse.   Plan Dyspareunia, female  1. Start: Estradiol Cream 0.02%; Use 33ml nightly 2. Pelvic Exam; Status:Hold  For - Manual Activation; Requested for:30Mar2015;  Female pelvic pain  3. Stop: Diazepam 10 MG Oral Tablet 4. Stop: Elmiron 100 MG Oral Capsule Health Maintenance  5. UA With REFLEX; [Do Not Release]; Status:Resulted - Requires Verification;   Done:  [id number] 01:52PM  Estradiol cream  Arrange surgery to removal of vaginal mesh from urethra to apex.   Probable PT pelvic floor in the future.   Save removed mesh for pt to give to her attorney.   Discussion/Summary cc: Vanessa Kick, MD   A total of 75 minutes were spent in the overall care of the patient today with 60 minutes in direct face to face consultation.    Signatures Electronically signed by : Carolan Clines, M.D.; Feb 23 2014  6:41PM EST

## 2014-03-23 NOTE — Anesthesia Postprocedure Evaluation (Signed)
  Anesthesia Post-op Note  Patient: Desiree Anderson  Procedure(s) Performed: Procedure(s) (LRB): EXCISION OF VAGINAL AND PERI-URETHRAL MESH, EXTRACTION FROM URETHRA TO APEX, insertion of XENFORM in the vaginal vault (N/A)  Patient Location: PACU  Anesthesia Type: General  Level of Consciousness: awake and alert   Airway and Oxygen Therapy: Patient Spontanous Breathing  Post-op Pain: mild  Post-op Assessment: Post-op Vital signs reviewed, Patient's Cardiovascular Status Stable, Respiratory Function Stable, Patent Airway and No signs of Nausea or vomiting  Last Vitals:  Filed Vitals:   03/23/14 1325  BP:   Pulse:   Temp:   Resp: 14    Post-op Vital Signs: stable   Complications: No apparent anesthesia complications

## 2014-03-23 NOTE — Anesthesia Preprocedure Evaluation (Addendum)
Anesthesia Evaluation  Patient identified by MRN, date of birth, ID band Patient awake    Reviewed: Allergy & Precautions, H&P , NPO status , Patient's Chart, lab work & pertinent test results  Airway Mallampati: II TM Distance: >3 FB Neck ROM: Full    Dental no notable dental hx.    Pulmonary neg pulmonary ROS, former smoker,  breath sounds clear to auscultation  Pulmonary exam normal       Cardiovascular hypertension, Pt. on medications Rhythm:Regular Rate:Normal  Overall Impression:  Low risk stress nuclear study with a small, mild apical septal fixed perfusion defect. Given normal wall motion, suspect this represents attenuation. .   LV Ejection Fraction: 74%.  LV Wall Motion:  NL LV Function; NL Wall Motion   Loralie Champagne 07/02/2013      Neuro/Psych Anxiety negative neurological ROS     GI/Hepatic negative GI ROS, Neg liver ROS,   Endo/Other  Hypothyroidism   Renal/GU negative Renal ROS  negative genitourinary   Musculoskeletal negative musculoskeletal ROS (+)   Abdominal   Peds negative pediatric ROS (+)  Hematology negative hematology ROS (+)   Anesthesia Other Findings   Reproductive/Obstetrics negative OB ROS                          Anesthesia Physical Anesthesia Plan  ASA: II  Anesthesia Plan: General   Post-op Pain Management:    Induction: Intravenous  Airway Management Planned: LMA  Additional Equipment:   Intra-op Plan:   Post-operative Plan:   Informed Consent: I have reviewed the patients History and Physical, chart, labs and discussed the procedure including the risks, benefits and alternatives for the proposed anesthesia with the patient or authorized representative who has indicated his/her understanding and acceptance.   Dental advisory given  Plan Discussed with: CRNA and Surgeon  Anesthesia Plan Comments:         Anesthesia Quick  Evaluation

## 2014-03-23 NOTE — Discharge Instructions (Addendum)
Anterior or Posterior Colporrhaphy, Sling Procedure Care After Refer to this sheet in the next few weeks. These instructions provide you with information on caring for yourself after your procedure. Your caregiver may also give you specific instructions. Your treatment has been planned according to current medical practices, but problems sometimes occur. Call your caregiver if you have any problems or questions after your procedure. HOME CARE INSTRUCTIONS  Rest as much as possible the first 2 weeks.   Avoid heavy lifting (more than 10 pounds or 4.5 kg), pushing, or pulling. Limit stair climbing to once or twice a day the first week, then slowly increase this activity.   Avoid standing for prolonged periods of time.   Talk with your caregiver about when you may resume your usual physical activity.   You may resume your normal diet.   Limit fluids to 6 to 8 glasses of non-caffeinated beverages per day.   Eat a well-balanced diet. Daily portions of food from the meat (protein), milk, fruit, vegetable, and bread families are necessary for your health.   Your normal bowel function should return. If constipation should occur, you may:   Take a mild laxative.   Add fruit and bran to your diet.   Drink more liquids.   If additional bladder care is required, you will receive instructions from your caregiver. Call your caregiver if you experience burning, urgency, or frequency on urination or if you are unable to completely empty your bladder.   You may take a shower and wash your hair.   Only take over-the-counter or prescription medicines for pain, fever, or discomfort as directed by your caregiver.   Clean the incision with water. Do not use a dressing unless the incision is draining or irritated. Check your incision daily for redness, draining, swelling, or separation of the skin. Call your caregiver if you notice any of these.   Keep your perineal area (the area between vagina and  rectum) clean and dry. Perform perineal care after every bowel movement and each time you urinate. You may take a sitz bath or sit in a tub of clean, warm water when necessary, unless your caregiver tells you otherwise.   Do not have sexual intercourse until permitted by your caregiver.   It is still important to have a yearly pelvic exam.  SEEK MEDICAL CARE IF:  You have shaking chills.   You have an oral temperature above 102 F (38.9 C).   Your pain is not relieved or becomes severe.  MAKE SURE YOU:   Understand these instructions.   Will watch your condition.   Will get help right away if you are not doing well or get worse.  Document Released: 06/27/2004 Document Revised: 11/02/2011 Document Reviewed: 03/18/2008 [facility] Patient Information 2012 [place]. Cystocele Repair Cystocele repair is surgery to remove a cystocele, which is a bulging, drooping area (hernia) of the bladder that extends into the vagina. This bulging occurs on the top front wall of the vagina. LET [facility] CARE PROVIDER KNOW ABOUT:   Any allergies you have.  All medicines you are taking, including vitamins, herbs, eye drops, creams, and over-the-counter medicines.  Use of steroids (by mouth or creams).  Previous problems you or members of your family have had with the use of anesthetics.  Any blood disorders you have.  Previous surgeries you have had.  Medical conditions you have.  Possibility of pregnancy, if this applies. RISKS AND COMPLICATIONS  Generally, this is a safe procedure. However, as  with any procedure, complications can occur. Possible complications include:  Excessive bleeding.  Infection.  Injury to surrounding structures.  Problems related to anesthetics. The risks will vary depending on the type of anesthetic given.  Problems with the urinary catheter after surgery, such as blockage.  Return of the cystocele. BEFORE THE PROCEDURE   Ask your health care  provider about changing or stopping your regular medicines. You may need to stop taking certain medicines 1 week before surgery.  Do not eat or drink anything after midnight the night before surgery.  If you smoke, do not smoke for at least 2 weeks before the surgery.  Do not drink any alcohol for 3 days before the surgery.  Arrange for someone to drive you home after your hospital stay and to help you with activities during recovery. PROCEDURE   You will be given a medicine that makes you sleep through the procedure (general anesthetic) or a medicine injected into your spine that numbs your body below the waist (spinal or epidural anesthetic). You will be asleep or be numbed through the entire procedure.  A thin, flexible tube (Foley catheter) will be placed in your bladder to drain urine during and after the surgery.  The surgery is performed through the vagina. The front wall of the vagina is opened up, and the muscle between the bladder and vagina is pulled up to its normal position. This is reinforced with stitches or a piece of mesh. This removes the hernia so that the top of the vagina does not fall into the opening of the vagina.  The cut on the front wall of the vagina is then closed with absorbable stitches that do not need to be removed. AFTER THE PROCEDURE   You will be taken to a recovery area where your progress will be closely watched. Your breathing, blood pressure, and pulse (vital signs) will be checked often. When you are stable, you will be taken to a regular hospital room.  You will have a catheter in place to drain your bladder. This will stay in place for 2 7 days or until your bladder is working properly on its own.  You may have gauze packing in the vagina. This will be removed 1 2 days after the surgery.  You will be given pain medicine as needed and may be given a medicine that kills germs (antibiotic).  You will likely need to stay in the hospital for 1 2  days. Document Released: 11/10/2000 Document Revised: 07/16/2013 Document Reviewed: 05/02/2013 [facility] Patient Information 2014 [place].  Post Anesthesia Home Care Instructions  Activity: Get plenty of rest for the remainder of the day. A responsible adult should stay with you for 24 hours following the procedure.  For the next 24 hours, DO NOT: -Drive a car -Paediatric nurse -Drink alcoholic beverages -Take any medication unless instructed by your physician -Make any legal decisions or sign important papers.  Meals: Start with liquid foods such as gelatin or soup. Progress to regular foods as tolerated. Avoid greasy, spicy, heavy foods. If nausea and/or vomiting occur, drink only clear liquids until the nausea and/or vomiting subsides. Call your physician if vomiting continues.  Special Instructions/Symptoms: Your throat may feel dry or sore from the anesthesia or the breathing tube placed in your throat during surgery. If this causes discomfort, gargle with warm salt water. The discomfort should disappear within 24 hours.

## 2014-03-23 NOTE — Anesthesia Procedure Notes (Signed)
Procedure Name: LMA Insertion Date/Time: 03/23/2014 10:31 AM Performed by: Denna Haggard D Pre-anesthesia Checklist: Patient identified, Emergency Drugs available, Suction available and Patient being monitored Patient Re-evaluated:Patient Re-evaluated prior to inductionOxygen Delivery Method: Circle System Utilized Preoxygenation: Pre-oxygenation with 100% oxygen Intubation Type: IV induction Ventilation: Mask ventilation without difficulty LMA: LMA inserted LMA Size: 4.0 Number of attempts: 1 Airway Equipment and Method: bite block Placement Confirmation: positive ETCO2 Tube secured with: Tape Dental Injury: Teeth and Oropharynx as per pre-operative assessment

## 2014-03-23 NOTE — Interval H&P Note (Signed)
History and Physical Interval Note:  03/23/2014 10:13 AM  Desiree Anderson  has presented today for surgery, with the diagnosis of Vaginal Extrusion of Mesh  The various methods of treatment have been discussed with the patient and family. After consideration of risks, benefits and other options for treatment, the patient has consented to  Procedure(s): EXCISION OF VAGINAL AND PERI-URETHRAL MESH, EXTRACTION FROM URETHRA TO APEX (N/A) as a surgical intervention .  [name] history has been reviewed, patient examined, no change in status, stable for surgery.  I have reviewed the patient's chart and labs.  Questions were answered to the patient's satisfaction.     Ailene Rud

## 2014-03-24 ENCOUNTER — Encounter (HOSPITAL_BASED_OUTPATIENT_CLINIC_OR_DEPARTMENT_OTHER): Payer: Self-pay | Admitting: Urology

## 2014-03-24 MED ORDER — OXYCODONE-ACETAMINOPHEN 5-325 MG PO TABS
ORAL_TABLET | ORAL | Status: AC
  Filled 2014-03-24: qty 1

## 2014-03-24 MED ORDER — URIBEL 118 MG PO CAPS: 1.0000 | ORAL_CAPSULE | Freq: Two times a day (BID) | ORAL | Status: DC | PRN

## 2014-03-24 MED ORDER — TRIMETHOPRIM 100 MG PO TABS: 100.0000 mg | ORAL_TABLET | ORAL | Status: DC

## 2014-03-24 MED ORDER — OXYCODONE-ACETAMINOPHEN 5-325 MG PO TABS: 1.0000 | ORAL_TABLET | Freq: Four times a day (QID) | ORAL | Status: DC | PRN

## 2014-03-24 NOTE — Progress Notes (Addendum)
Pt's B/P's on lying in bed:   0008- 78/43; pulse  62.     0145- 95/51; pulse 53.  Will continue to monitor pt closely

## 2014-03-24 NOTE — Progress Notes (Signed)
Urology Progress Note  1 Day Post-Op   Subjective: packing out. Foley out. Hav some vaginal bleeding last night, but none today. Vaginal pain much improved. Pt is cautiously optimistic. + void normally this AM after packing and foley removed.    No acute urologic events overnight. Ambulation:   positive Flatus:    positive Bowel movement  negative  Pain: some relief  Objective:  Blood pressure 109/60, pulse 75, temperature 96.8 F (36 C), temperature source Oral, resp. rate 16, height 5\' 3"  (1.6 m), weight 72.576 kg (160 lb), SpO2 96.00%.  Physical Exam:  General:  No acute distress, awake Extremities: extremities normal, atraumatic, no cyanosis or edema Genitourinary:  Normal s-p area.  Foley: out    I/O last 3 completed shifts: In: 3213 [P.O.:910; I.V.:2303] Out: 2775 [Urine:2775]  Recent Labs     03/23/14  0900  HGB  13.0  WBC  7.7  PLT  290    Recent Labs     03/23/14  0900  NA  141  K  3.8  CL  100  CO2  28  BUN  10  CREATININE  0.64  CALCIUM  9.2  GFRNONAA  >90  GFRAA  >90     No results found for this basename: PT, INR, APTT,  in the last 72 hours   No components found with this basename: ABG,   Assessment/Plan:  Wound care discussed. No biking; no mowing; no horseback riding; no heavy lifting.  RTC for follow-up. OK for vinegar and water douche next week.  No intercourse yet.

## 2014-03-24 NOTE — Progress Notes (Signed)
Redness noted to face and upper chest after washing with washcloth, no itching noted. Patient states that she will take a shower when she gets home and that she has to use a special detergent for her clothing.

## 2014-03-24 NOTE — Progress Notes (Signed)
Vaginal packing and foley catheter removed per order.  Small amount of bright red bleeding noted after packing removed.  Pt tolerated procedure well pt due to void.  Percocet one tablet given for pain in perineum.  Pt stated that she slept very little during night.

## 2014-03-26 ENCOUNTER — Encounter: Payer: Self-pay | Admitting: Internal Medicine

## 2014-04-02 ENCOUNTER — Other Ambulatory Visit: Payer: Self-pay | Admitting: Urology

## 2014-04-02 ENCOUNTER — Encounter (HOSPITAL_BASED_OUTPATIENT_CLINIC_OR_DEPARTMENT_OTHER): Payer: Self-pay | Admitting: *Deleted

## 2014-04-02 NOTE — Progress Notes (Signed)
NPO AFTER MN. ARRIVE AT 0830.  CURRENT LAB RESULTS IN CHART AND EPIC. WILL TAKE SYNTHROID AND ZANTAC AM DOS W/ SIPS OF WATER .

## 2014-04-06 ENCOUNTER — Ambulatory Visit (HOSPITAL_BASED_OUTPATIENT_CLINIC_OR_DEPARTMENT_OTHER): Payer: Medicare Other | Admitting: Anesthesiology

## 2014-04-06 ENCOUNTER — Encounter (HOSPITAL_BASED_OUTPATIENT_CLINIC_OR_DEPARTMENT_OTHER): Payer: Medicare Other | Admitting: Anesthesiology

## 2014-04-06 ENCOUNTER — Encounter (HOSPITAL_BASED_OUTPATIENT_CLINIC_OR_DEPARTMENT_OTHER)
Admission: RE | Disposition: A | Discharge status: Home / Self Care | Payer: Self-pay | Source: Ambulatory Visit | Attending: Urology

## 2014-04-06 ENCOUNTER — Ambulatory Visit (HOSPITAL_BASED_OUTPATIENT_CLINIC_OR_DEPARTMENT_OTHER)
Admission: RE | Admit: 2014-04-06 | Discharge: 2014-04-06 | Disposition: A | Discharge status: Home / Self Care | Payer: Medicare Other | Source: Ambulatory Visit | Attending: Urology | Admitting: Urology

## 2014-04-06 ENCOUNTER — Encounter (HOSPITAL_BASED_OUTPATIENT_CLINIC_OR_DEPARTMENT_OTHER): Payer: Self-pay | Admitting: Anesthesiology

## 2014-04-06 DIAGNOSIS — Y838 Other surgical procedures as the cause of abnormal reaction of the patient, or of later complication, without mention of misadventure at the time of the procedure: Secondary | ICD-10-CM | POA: Insufficient documentation

## 2014-04-06 DIAGNOSIS — T81329A Deep disruption or dehiscence of operation wound, unspecified, initial encounter: Secondary | ICD-10-CM | POA: Insufficient documentation

## 2014-04-06 DIAGNOSIS — I10 Essential (primary) hypertension: Secondary | ICD-10-CM | POA: Insufficient documentation

## 2014-04-06 DIAGNOSIS — T8132XA Disruption of internal operation (surgical) wound, not elsewhere classified, initial encounter: Secondary | ICD-10-CM | POA: Insufficient documentation

## 2014-04-06 DIAGNOSIS — Z87891 Personal history of nicotine dependence: Secondary | ICD-10-CM | POA: Insufficient documentation

## 2014-04-06 HISTORY — PX: EXCISION VAGINAL CYST: SHX5825

## 2014-04-06 SURGERY — EXCISION, CYST, VAGINA
Anesthesia: General | Site: Vagina

## 2014-04-06 MED ORDER — SODIUM CHLORIDE 0.9 % IR SOLN
Status: DC | PRN
  Administered 2014-04-06: 11:00:00

## 2014-04-06 MED ORDER — LACTATED RINGERS IV SOLN
INTRAVENOUS | Status: DC
  Filled 2014-04-06: qty 1000

## 2014-04-06 MED ORDER — KETOROLAC TROMETHAMINE 30 MG/ML IJ SOLN
INTRAMUSCULAR | Status: DC | PRN
  Administered 2014-04-06: 30 mg via INTRAVENOUS

## 2014-04-06 MED ORDER — OXYCODONE-ACETAMINOPHEN 5-325 MG PO TABS: 1.0000 | ORAL_TABLET | Freq: Four times a day (QID) | ORAL | Status: DC | PRN

## 2014-04-06 MED ORDER — EPHEDRINE SULFATE 50 MG/ML IJ SOLN
INTRAMUSCULAR | Status: DC | PRN
  Administered 2014-04-06: 5 mg via INTRAVENOUS
  Administered 2014-04-06 (×3): 10 mg via INTRAVENOUS
  Administered 2014-04-06: 5 mg via INTRAVENOUS
  Administered 2014-04-06: 10 mg via INTRAVENOUS

## 2014-04-06 MED ORDER — MIDAZOLAM HCL 2 MG/2ML IJ SOLN
INTRAMUSCULAR | Status: AC
  Filled 2014-04-06: qty 2

## 2014-04-06 MED ORDER — PROPOFOL 10 MG/ML IV BOLUS
INTRAVENOUS | Status: DC | PRN
  Administered 2014-04-06: 200 mg via INTRAVENOUS

## 2014-04-06 MED ORDER — BUPIVACAINE-EPINEPHRINE 0.25% -1:200000 IJ SOLN
INTRAMUSCULAR | Status: DC | PRN
  Administered 2014-04-06: 10 mL

## 2014-04-06 MED ORDER — LIDOCAINE HCL (CARDIAC) 20 MG/ML IV SOLN
INTRAVENOUS | Status: DC | PRN
  Administered 2014-04-06: 60 mg via INTRAVENOUS

## 2014-04-06 MED ORDER — LACTATED RINGERS IV SOLN
INTRAVENOUS | Status: DC
  Administered 2014-04-06 (×3): via INTRAVENOUS
  Filled 2014-04-06: qty 1000

## 2014-04-06 MED ORDER — 0.9 % SODIUM CHLORIDE (POUR BTL) OPTIME
TOPICAL | Status: DC | PRN
  Administered 2014-04-06: 500 mL

## 2014-04-06 MED ORDER — STERILE WATER FOR IRRIGATION IR SOLN
Status: DC | PRN
  Administered 2014-04-06: 500 mL

## 2014-04-06 MED ORDER — ONDANSETRON HCL 4 MG/2ML IJ SOLN
INTRAMUSCULAR | Status: DC | PRN
  Administered 2014-04-06: 4 mg via INTRAVENOUS

## 2014-04-06 MED ORDER — ESTRADIOL 0.1 MG/GM VA CREA
TOPICAL_CREAM | VAGINAL | Status: DC | PRN
  Administered 2014-04-06: 1 via VAGINAL

## 2014-04-06 MED ORDER — ONDANSETRON HCL 4 MG PO TABS: 4.0000 mg | ORAL_TABLET | Freq: Three times a day (TID) | ORAL | Status: DC | PRN

## 2014-04-06 MED ORDER — ACETAMINOPHEN 10 MG/ML IV SOLN
INTRAVENOUS | Status: DC | PRN
  Administered 2014-04-06: 1000 mg via INTRAVENOUS

## 2014-04-06 MED ORDER — FENTANYL CITRATE 0.05 MG/ML IJ SOLN
25.0000 ug | INTRAMUSCULAR | Status: DC | PRN
  Filled 2014-04-06: qty 1

## 2014-04-06 MED ORDER — MIDAZOLAM HCL 5 MG/5ML IJ SOLN
INTRAMUSCULAR | Status: DC | PRN
  Administered 2014-04-06: 2 mg via INTRAVENOUS

## 2014-04-06 MED ORDER — CEFAZOLIN SODIUM-DEXTROSE 2-3 GM-% IV SOLR
2.0000 g | Freq: Once | INTRAVENOUS | Status: AC
  Administered 2014-04-06: 2 g via INTRAVENOUS
  Filled 2014-04-06: qty 50

## 2014-04-06 MED ORDER — FENTANYL CITRATE 0.05 MG/ML IJ SOLN
INTRAMUSCULAR | Status: DC | PRN
  Administered 2014-04-06: 50 ug via INTRAVENOUS
  Administered 2014-04-06 (×2): 25 ug via INTRAVENOUS
  Administered 2014-04-06: 50 ug via INTRAVENOUS
  Administered 2014-04-06 (×2): 25 ug via INTRAVENOUS

## 2014-04-06 MED ORDER — BUPIVACAINE HCL (PF) 0.25 % IJ SOLN: INTRAMUSCULAR | Status: DC | PRN

## 2014-04-06 MED ORDER — DEXAMETHASONE SODIUM PHOSPHATE 4 MG/ML IJ SOLN
INTRAMUSCULAR | Status: DC | PRN
  Administered 2014-04-06: 10 mg via INTRAVENOUS

## 2014-04-06 MED ORDER — FENTANYL CITRATE 0.05 MG/ML IJ SOLN
INTRAMUSCULAR | Status: AC
  Filled 2014-04-06: qty 4

## 2014-04-06 SURGICAL SUPPLY — 35 items
BAG DRAIN URO-CYSTO SKYTR STRL (DRAIN) ×2 IMPLANT
BAG URINE DRAINAGE (UROLOGICAL SUPPLIES) ×2 IMPLANT
BLADE SURG 10 STRL SS (BLADE) IMPLANT
BLADE SURG 15 STRL LF DISP TIS (BLADE) ×1 IMPLANT
BLADE SURG 15 STRL SS (BLADE) ×1
BLADE SURG ROTATE 9660 (MISCELLANEOUS) IMPLANT
BOOTIES KNEE HIGH SLOAN (MISCELLANEOUS) ×2 IMPLANT
CANISTER SUCTION 1200CC (MISCELLANEOUS) IMPLANT
CANISTER SUCTION 2500CC (MISCELLANEOUS) ×2 IMPLANT
CATH FOLEY 2WAY SLVR  5CC 16FR (CATHETERS) ×1
CATH FOLEY 2WAY SLVR 5CC 16FR (CATHETERS) ×1 IMPLANT
CLOTH BEACON ORANGE TIMEOUT ST (SAFETY) ×2 IMPLANT
DRAPE CAMERA CLOSED 9X96 (DRAPES) ×2 IMPLANT
GLOVE BIO SURGEON STRL SZ 6.5 (GLOVE) ×2 IMPLANT
GLOVE BIO SURGEON STRL SZ7.5 (GLOVE) ×2 IMPLANT
GLOVE INDICATOR 7.0 STRL GRN (GLOVE) ×2 IMPLANT
GOWN STRL REIN XL XLG (GOWN DISPOSABLE) IMPLANT
GOWN STRL REUS W/ TWL LRG LVL3 (GOWN DISPOSABLE) ×1 IMPLANT
GOWN STRL REUS W/TWL LRG LVL3 (GOWN DISPOSABLE) ×1
GOWN STRL REUS W/TWL XL LVL3 (GOWN DISPOSABLE) ×2 IMPLANT
NEEDLE HYPO 22GX1.5 SAFETY (NEEDLE) ×2 IMPLANT
PACK CYSTOSCOPY (CUSTOM PROCEDURE TRAY) ×2 IMPLANT
PACKING VAGINAL (PACKING) ×2 IMPLANT
PENCIL BUTTON HOLSTER BLD 10FT (ELECTRODE) ×2 IMPLANT
PLUG CATH AND CAP STER (CATHETERS) IMPLANT
RETRACTOR LONRSTAR 16.6X16.6CM (MISCELLANEOUS) ×1 IMPLANT
RETRACTOR STAY HOOK 5MM (MISCELLANEOUS) ×2 IMPLANT
RETRACTOR STER APS 16.6X16.6CM (MISCELLANEOUS) ×2
SUT PDS AB 2-0 CT2 27 (SUTURE) ×8 IMPLANT
SUT VIC AB 2-0 UR6 27 (SUTURE) ×4 IMPLANT
SYR BULB IRRIGATION 50ML (SYRINGE) ×2 IMPLANT
SYRINGE 10CC LL (SYRINGE) ×2 IMPLANT
TUBE CONNECTING 12X1/4 (SUCTIONS) ×2 IMPLANT
WATER STERILE IRR 500ML POUR (IV SOLUTION) ×2 IMPLANT
YANKAUER SUCT BULB TIP NO VENT (SUCTIONS) ×2 IMPLANT

## 2014-04-06 NOTE — Anesthesia Preprocedure Evaluation (Signed)
Anesthesia Evaluation  Patient identified by MRN, date of birth, ID band Patient awake    Reviewed: Allergy & Precautions, H&P , NPO status , Patient's Chart, lab work & pertinent test results  Airway Mallampati: II TM Distance: >3 FB Neck ROM: full    Dental no notable dental hx. (+) Teeth Intact, Dental Advisory Given   Pulmonary neg pulmonary ROS, former smoker,  breath sounds clear to auscultation  Pulmonary exam normal       Cardiovascular Exercise Tolerance: Good hypertension, Pt. on medications negative cardio ROS  Rhythm:regular Rate:Normal     Neuro/Psych negative neurological ROS  negative psych ROS   GI/Hepatic negative GI ROS, Neg liver ROS,   Endo/Other  negative endocrine ROSHypothyroidism   Renal/GU negative Renal ROS  negative genitourinary   Musculoskeletal   Abdominal   Peds  Hematology negative hematology ROS (+)   Anesthesia Other Findings   Reproductive/Obstetrics negative OB ROS                           Anesthesia Physical Anesthesia Plan  ASA: II  Anesthesia Plan: General   Post-op Pain Management:    Induction: Intravenous  Airway Management Planned: LMA  Additional Equipment:   Intra-op Plan:   Post-operative Plan:   Informed Consent: I have reviewed the patients History and Physical, chart, labs and discussed the procedure including the risks, benefits and alternatives for the proposed anesthesia with the patient or authorized representative who has indicated his/her understanding and acceptance.   Dental Advisory Given  Plan Discussed with: CRNA and Surgeon  Anesthesia Plan Comments:         Anesthesia Quick Evaluation

## 2014-04-06 NOTE — Interval H&P Note (Signed)
History and Physical Interval Note:  04/06/2014 9:55 AM  [name]  has presented today for surgery, with the diagnosis of VAGINAL BLEEDING/POST MESH REMOVAL  The various methods of treatment have been discussed with the patient and family. After consideration of risks, benefits and other options for treatment, the patient has consented to  Procedure(s): VAGINAL SPECULUM EXAMINATION/POSSIBLE DRAINAGE OF HEMATOMA (N/A) as a surgical intervention .  [name] history has been reviewed, patient examined, no change in status, stable for surgery.  I have reviewed the patient's chart and labs.  Questions were answered to the patient's satisfaction.     Bianco Cange I Krystyn Picking   

## 2014-04-06 NOTE — Discharge Instructions (Addendum)
Anterior or Posterior Colporrhaphy, Sling Procedure Care After Refer to this sheet in the next few weeks. These instructions provide you with information on caring for yourself after your procedure. Your caregiver may also give you specific instructions. Your treatment has been planned according to current medical practices, but problems sometimes occur. Call your caregiver if you have any problems or questions after your procedure. HOME CARE INSTRUCTIONS  Rest as much as possible the first 2 weeks.   Avoid heavy lifting (more than 10 pounds or 4.5 kg), pushing, or pulling. Limit stair climbing to once or twice a day the first week, then slowly increase this activity.   Avoid standing for prolonged periods of time.   Talk with your caregiver about when you may resume your usual physical activity.   You may resume your normal diet.   Limit fluids to 6 to 8 glasses of non-caffeinated beverages per day.   Eat a well-balanced diet. Daily portions of food from the meat (protein), milk, fruit, vegetable, and bread families are necessary for your health.   Your normal bowel function should return. If constipation should occur, you may:   Take a mild laxative.   Add fruit and bran to your diet.   Drink more liquids.   If additional bladder care is required, you will receive instructions from your caregiver. Call your caregiver if you experience burning, urgency, or frequency on urination or if you are unable to completely empty your bladder.   You may take a shower and wash your hair.   Only take over-the-counter or prescription medicines for pain, fever, or discomfort as directed by your caregiver.   Clean the incision with water. Do not use a dressing unless the incision is draining or irritated. Check your incision daily for redness, draining, swelling, or separation of the skin. Call your caregiver if you notice any of these.   Keep your perineal area (the area between vagina and  rectum) clean and dry. Perform perineal care after every bowel movement and each time you urinate. You may take a sitz bath or sit in a tub of clean, warm water when necessary, unless your caregiver tells you otherwise.   Do not have sexual intercourse until permitted by your caregiver.   It is still important to have a yearly pelvic exam.  SEEK MEDICAL CARE IF:  You have shaking chills.   You have an oral temperature above 102 F (38.9 C).   Your pain is not relieved or becomes severe.  MAKE SURE YOU:   Understand these instructions.   Will watch your condition.   Will get help right away if you are not doing well or get worse.  Document Released: 06/27/2004 Document Revised: 11/02/2011 Document Reviewed: 03/18/2008 [facility] Patient Information 2012 [place], [place].  ***Return to office in the am for packing and foley cath removal   Post Anesthesia Home Care Instructions  Activity: Get plenty of rest for the remainder of the day. A responsible adult should stay with you for 24 hours following the procedure.  For the next 24 hours, DO NOT: -Drive a car -Paediatric nurse -Drink alcoholic beverages -Take any medication unless instructed by your physician -Make any legal decisions or sign important papers.  Meals: Start with liquid foods such as gelatin or soup. Progress to regular foods as tolerated. Avoid greasy, spicy, heavy foods. If nausea and/or vomiting occur, drink only clear liquids until the nausea and/or vomiting subsides. Call your physician if vomiting continues.  Special Instructions/Symptoms: Your  throat may feel dry or sore from the anesthesia or the breathing tube placed in your throat during surgery. If this causes discomfort, gargle with warm salt water. The discomfort should disappear within 24 hours.

## 2014-04-06 NOTE — Transfer of Care (Signed)
Immediate Anesthesia Transfer of Care Note  Patient: Desiree Anderson  Procedure(s) Performed: Procedure(s) (LRB): VAGINAL SPECULUM EXAMINATION/POSSIBLE DRAINAGE OF HEMATOMA (N/A)  Patient Location: PACU  Anesthesia Type: General  Level of Consciousness: awake, alert  and oriented  Airway & Oxygen Therapy: Patient Spontanous Breathing and Patient connected to face mask oxygen  Post-op Assessment: Report given to PACU RN and Post -op Vital signs reviewed and stable  Post vital signs: Reviewed and stable  Complications: No apparent anesthesia complications

## 2014-04-06 NOTE — Interval H&P Note (Signed)
History and Physical Interval Note:  04/06/2014 9:55 AM  Desiree Anderson  has presented today for surgery, with the diagnosis of VAGINAL BLEEDING/POST MESH REMOVAL  The various methods of treatment have been discussed with the patient and family. After consideration of risks, benefits and other options for treatment, the patient has consented to  Procedure(s): VAGINAL SPECULUM EXAMINATION/POSSIBLE DRAINAGE OF HEMATOMA (N/A) as a surgical intervention .  [name] history has been reviewed, patient examined, no change in status, stable for surgery.  I have reviewed the patient's chart and labs.  Questions were answered to the patient's satisfaction.     Ailene Rud

## 2014-04-06 NOTE — Anesthesia Postprocedure Evaluation (Signed)
  Anesthesia Post-op Note  Patient: Desiree Anderson  Procedure(s) Performed: Procedure(s) (LRB): VAGINAL SPECULUM EXAMINATION/POSSIBLE DRAINAGE OF HEMATOMA (N/A)  Patient Location: PACU  Anesthesia Type: General  Level of Consciousness: awake and alert   Airway and Oxygen Therapy: Patient Spontanous Breathing  Post-op Pain: mild  Post-op Assessment: Post-op Vital signs reviewed, Patient's Cardiovascular Status Stable, Respiratory Function Stable, Patent Airway and No signs of Nausea or vomiting  Last Vitals:  Filed Vitals:   04/06/14 1145  BP:   Pulse:   Temp: 36.3 C  Resp:     Post-op Vital Signs: stable   Complications: No apparent anesthesia complications

## 2014-04-06 NOTE — Op Note (Signed)
Pre-operative diagnosis :   Vaginal bleeding status post extirpation of anterior vaginal vault mesh  Postoperative diagnosis:  Vaginal bleeding secondary to midline suture line breakdown post anterior vaginal vault mesh removal  Operation:  Vaginal speculum examination, debridement of midline suture line wound, dissection of midline anterior vaginal vault tissue, creation of relaxing incisions in the lateral vaginal vault, closure of anterior vaginal vault midline with 2-0 PDS suture, closure of relaxing incisions with 2-0 Vicryl suture. Antibiotic irrigation.  Surgeon:  Chauncey Cruel. Gaynelle Arabian, MD  First assistant: None    Anesthesia:  General LMA   Preparation:  After appropriate preanesthesia, the patient is brought the operative room, placed on the operating table in the dorsal supine position where general LMA anesthesia was introduced. She was replaced in dorsal lithotomy position with pubis was prepped with Betadine solution and draped in usual fashion. History was double checked. Armband was double checked.   Review history:  . Chronic interstitial cystitis without hematuria (595.1)   Assessed By: Carolan Clines (Urology); Last Assessed: 23 Feb 2014  2. Dyspareunia, female (625.0)   Assessed By: Carolan Clines (Urology); Last Assessed: 23 Feb 2014  3. Female pelvic pain (625.9)   Assessed By: Carolan Clines (Urology); Last Assessed: 23 Feb 2014  History of Present Illness  58 year old female, last seen 09/04/11, referred back by Dr. Harrington Challenger for further evaluation of bladder pain with intercourse. She complains of frequency & constant nausea. She drinks 8oz of tea per day. She has previous hx of fecal incontinence, evaluated by Dr. Donna Christen (GI) @ [facility]. She has a hx of IC, s/p cystoscopy/HOD on 08/29/10. She has been treated with vagisil per Dr. Rockwell Alexandria, and complains of vaginal dryness and dyspaurena.  She has finished physical therapy in Jan. 2013, at which time her bladder pain  had decreased/improved.  .  She is s/p bladder tack in 2003 in [facility], and vaginal hysterectomy ( Dr. Grayland Jack, [facility]). Postoperatively, her urge incontinence continued, and she c/o bladder cough and sneeze incontinence which improved for only 6 months post op. She then suffered a recurrence, and felt a "bulge". Patient underwent a PROLIFT and TVT in [place] ( Dr. Thora Lance [facility]), but developed dyspareunia, with post op problem of irritation of her husband's penis, and eventually she underwent vaginal mesh extrusion repair, with removal of mesh extrusion and treatment a vaginal infection ( [place]). She underwent mesh extrusion repair, and coincidental posterior PROLIFT prolapse repair in [facility] in 2009, with worsening dyspareunia, and the onset of fecal incontinence. She has developed pressure in her pelvis, as well as pelvic pain.  She was seen in [place] by Dr. Harolyn Rutherford with diagnosis of sling extrusion, and went back to [facility] for repair, with removal of extruded sling. She was then seen at [facility] for recurrent UTI and incontinence, with urodynamics on 09/05/10, shows a small capacity, painful bladder. She has stress urinary incontinence, and very low Valsalva leak point pressure. She was able to inhibit her low amplitude contractions, but but complains of pain with leak, and with this unstable contraction. She was able to void voluntarily but showed an obstructive flow pattern. There did appear to be a small lateral tear at the patient's urinary meatus, which appeared to be a scratch.  She then was seen at [facility] by Dr. Orinda Kenner for another opinion, and was told that removal of the mesh would probably mean return of the prolapse. he biggest problem at that time was orgasm recovery,  and she has concentrated on that. She now has pain with all vaginal penetration, and she has pain on the outside of the vagina.      Statement of  Likelihood of Success:  Excellent. TIME-OUT observed.:  Procedure:  Vaginal vault examination was accomplished using a headlight, and [place] retractor and posterior weighted speculum with Foley catheter placed. It was apparent that the midline vaginal vault incision had separated because of necrotic wound edges. The previously placed Xenform tissue was present, but was noted to be soft, and degree. The wound edges were debrided, and cut a copious amount of antibiotic irrigation was used.  It is noted that at the previous surgery, the midline incision was used because of severe scarring, and the need to remove the midline incision. Wound edges were felt to be sufficiently relaxed for closure at the time. However, isn't feeling at this time that the wound edges are under tension, and therefore, 2 relaxing incisions are made in the lateral portion of the vagina to allow midline wound closure. Following debridement, using 2-0 PDS suture, interrupted vertical mattress sutures are used to bring the vertical incision wound edges together. The relaxing incisions are closed with 2-0 Vicryl suture. Minimal bleeding is noted. Xylocaine 0.25% with epinephrine 1 200,000 is used for local anesthesia, and the patient received IV Tylenol and IV Toradol.  Following procedure, was felt that vaginal packing and Foley catheter should be left overnight. The patient will be offered overnight stay, or discharge with return a.m. for packing a catheter removal. She was awakened, and taken to recovery room in good condition.

## 2014-04-06 NOTE — Anesthesia Procedure Notes (Signed)
Procedure Name: LMA Insertion Date/Time: 04/06/2014 10:15 AM Performed by: Mechele Claude Pre-anesthesia Checklist: Patient identified, Emergency Drugs available, Suction available and Patient being monitored Patient Re-evaluated:Patient Re-evaluated prior to inductionOxygen Delivery Method: Circle System Utilized Preoxygenation: Pre-oxygenation with 100% oxygen Intubation Type: IV induction Ventilation: Mask ventilation without difficulty LMA: LMA inserted LMA Size: 4.0 Number of attempts: 1 Airway Equipment and Method: bite block Placement Confirmation: positive ETCO2 Tube secured with: Tape Dental Injury: Teeth and Oropharynx as per pre-operative assessment

## 2014-04-06 NOTE — H&P (Signed)
Active Problems Problems  1. Female pelvic pain (625.9)   Assessed By: Carolan Clines (Urology); Last Assessed: 30 Mar 2014  History of Present Illness     59 year old female returns today for a 1 week post-op f/u. She is s/p explantation of extruded TVT mesh, explantation of extruded anterior vault Prolift mesh & implantation of Xenform biologic tissue for anterior repair on 03/23/14.    Originally referred back by Dr. Harrington Challenger for further evaluation of bladder pain with intercourse. She complains of frequency & constant nausea. She drinks 8oz of tea per day. She has previous hx of fecal incontinence, evaluated by Dr. Donna Christen (GI) @ [place]. She has a hx of IC, s/p cystoscopy/HOD on 08/29/10. She has been treated with vagisil per Dr. Rockwell Alexandria, and complains of vaginal dryness and dyspaurena.      She has finished physical therapy in Jan. 2013, at which time her bladder pain had decreased/improved.   .    She is s/p bladder tack in 2003 in [facility], and vaginal hysterectomy ( Dr. Grayland Jack, [facility]). Postoperatively, her urge incontinence continued, and she c/o bladder cough and sneeze incontinence which improved for only 6 months post op.  She then suffered a recurrence, and felt a "bulge". Patient underwent a PROLIFT and TVT in [place] ( Dr. Thora Lance [facility]), but developed dyspareunia, with post op problem of irritation of her husband's penis, and eventually she underwent vaginal mesh extrusion repair, with removal of mesh extrusion and treatment a vaginal infection ( [place]). She underwent mesh extrusion repair, and coincidental posterior PROLIFT prolapse repair in [facility] in 2009, with worsening dyspareunia, and the onset of fecal incontinence. She has developed pressure in her pelvis, as well as pelvic pain.      She was seen in [place] by Dr. Harolyn Rutherford with diagnosis of sling extrusion, and went back to [facility] for repair, with removal of extruded sling.  She was then seen at [facility] for recurrent UTI and incontinence, with urodynamics on 09/05/10, shows a small capacity, painful bladder. She has stress urinary incontinence, and very low Valsalva leak point pressure. She was able to inhibit her low amplitude contractions, but but complains of pain with leak, and with this unstable contraction. She was able to void voluntarily but showed an obstructive flow pattern. There did appear to be a small lateral tear at the patient's urinary meatus, which appeared to be a scratch.    She then was seen at [facility] by Dr. Orinda Kenner for another opinion, and was told that removal of the mesh would probably mean return of the prolapse. he biggest problem at that time was orgasm recovery, and she has concentrated on that. She now has pain with all vaginal penetration, and she has pain on the outside of the vagina.   Vitals Vital Signs [Data Includes: Last 1 Day]  Recorded: [id number] 02:56PM  Blood Pressure: 134 / 98 Temperature: 97.4 F Heart Rate: 112  Physical Exam Genitourinary:. Vaginal bleeding and clots.  Chaperone Present: kim Bobby Rumpf.  Examination of the external genitalia shows no vulvar atrophy and no condyloma acuminatum. There is no urethral mass. Urethral hypermobility is not present. Vaginal exam demonstrates the vaginal epithelium to be poorly estrogenized. No enterocele is identified. No rectocele is identified.    Results/Data Urine [Data Includes: Last 1 Day]   [id number]  COLOR ORANGE   APPEARANCE CLEAR   SPECIFIC GRAVITY 1.015   pH 6.5   GLUCOSE NEG mg/dL  BILIRUBIN  NEG   KETONE NEG mg/dL  BLOOD LARGE   PROTEIN TRACE mg/dL  UROBILINOGEN 0.2 mg/dL  NITRITE POS   LEUKOCYTE ESTERASE NEG   SQUAMOUS EPITHELIAL/HPF RARE   WBC 3-6 WBC/hpf  RBC 21-50 RBC/hpf  BACTERIA RARE   CRYSTALS NONE SEEN   CASTS NONE SEEN    Assessment Assessed  1. Female pelvic pain (625.9) 2. Vaginal bleeding (623.8)  Post op vaginal bleeding. ? rectal  bleeding also. She may have had a post op hematoma , which is draining,dark bladk blood, and occasional clots. She may need vaginal exploration if she has not drained completely.   Plan Health Maintenance  1. UA With REFLEX; [Do Not Release]; Status:Resulted - Requires Verification;   Done:  [id number] 02:38PM Vaginal bleeding  2. Follow-up Week x 1 Office  Follow-up  Status: Hold For - Appointment,Date of Service   Requested for: [id number] 3. CBC W/DIFF; Status:Hold For - Specimen/Data Collection,Appointment; Requested  for:04May2015;   RTC 1 week. Use vinegar douche, She will RTC 1 week. If she is still bleeding, she will need exam under anesthesia.   Signatures Electronically signed by : Carolan Clines, M.D.; Mar 30 2014  3:15PM EST

## 2014-04-07 ENCOUNTER — Encounter (HOSPITAL_BASED_OUTPATIENT_CLINIC_OR_DEPARTMENT_OTHER): Payer: Self-pay | Admitting: Urology

## 2014-04-08 ENCOUNTER — Other Ambulatory Visit: Payer: Self-pay | Admitting: Internal Medicine

## 2014-04-16 ENCOUNTER — Other Ambulatory Visit: Payer: Self-pay | Admitting: Internal Medicine

## 2014-04-21 ENCOUNTER — Other Ambulatory Visit: Payer: Self-pay | Admitting: Emergency Medicine

## 2014-05-28 ENCOUNTER — Ambulatory Visit (INDEPENDENT_AMBULATORY_CARE_PROVIDER_SITE_OTHER): Payer: Medicare Other | Admitting: Emergency Medicine

## 2014-05-28 VITALS — BP 122/74 | HR 76 | Temp 97.7°F | Resp 16 | Ht 63.0 in | Wt 158.8 lb

## 2014-05-28 DIAGNOSIS — E039 Hypothyroidism, unspecified: Secondary | ICD-10-CM

## 2014-05-28 DIAGNOSIS — R7309 Other abnormal glucose: Secondary | ICD-10-CM

## 2014-05-28 DIAGNOSIS — E782 Mixed hyperlipidemia: Secondary | ICD-10-CM

## 2014-05-28 DIAGNOSIS — I10 Essential (primary) hypertension: Secondary | ICD-10-CM

## 2014-05-28 NOTE — Patient Instructions (Signed)
Hypothyroidism The thyroid is a large gland located in the lower front of your neck. The thyroid gland helps control metabolism. Metabolism is how your body handles food. It controls metabolism with the hormone thyroxine. When this gland is underactive (hypothyroid), it produces too little hormone.  CAUSES These include:   Absence or destruction of thyroid tissue.  Goiter due to iodine deficiency.  Goiter due to medications.  Congenital defects (since birth).  Problems with the pituitary. This causes a lack of TSH (thyroid stimulating hormone). This hormone tells the thyroid to turn out more hormone. SYMPTOMS  Lethargy (feeling as though you have no energy)  Cold intolerance  Weight gain (in spite of normal food intake)  Dry skin  Coarse hair  Menstrual irregularity (if severe, may lead to infertility)  Slowing of thought processes Cardiac problems are also caused by insufficient amounts of thyroid hormone. Hypothyroidism in the newborn is cretinism, and is an extreme form. It is important that this form be treated adequately and immediately or it will lead rapidly to retarded physical and mental development. DIAGNOSIS  To prove hypothyroidism, your caregiver may do blood tests and ultrasound tests. Sometimes the signs are hidden. It may be necessary for your caregiver to watch this illness with blood tests either before or after diagnosis and treatment. TREATMENT  Low levels of thyroid hormone are increased by using synthetic thyroid hormone. This is a safe, effective treatment. It usually takes about four weeks to gain the full effects of the medication. After you have the full effect of the medication, it will generally take another four weeks for problems to leave. Your caregiver may start you on low doses. If you have had heart problems the dose may be gradually increased. It is generally not an emergency to get rapidly to normal. HOME CARE INSTRUCTIONS   Take your  medications as your caregiver suggests. Let your caregiver know of any medications you are taking or start taking. Your caregiver will help you with dosage schedules.  As your condition improves, your dosage needs may increase. It will be necessary to have continuing blood tests as suggested by your caregiver.  Report all suspected medication side effects to your caregiver. SEEK MEDICAL CARE IF: Seek medical care if you develop:  Sweating.  Tremulousness (tremors).  Anxiety.  Rapid weight loss.  Heat intolerance.  Emotional swings.  Diarrhea.  Weakness. SEEK IMMEDIATE MEDICAL CARE IF:  You develop chest pain, an irregular heart beat (palpitations), or a rapid heart beat. MAKE SURE YOU:   Understand these instructions.  Will watch your condition.  Will get help right away if you are not doing well or get worse. Document Released: 11/13/2005 Document Revised: 02/05/2012 Document Reviewed: 07/03/2008 [employer] Patient Information 2015 [employer]. This information is not intended to replace advice given to you by your health care provider. Make sure you discuss any questions you have with your health care provider.  

## 2014-05-28 NOTE — Progress Notes (Signed)
Subjective:    Patient ID: Desiree Anderson, female    DOB: 02-01-1956, 58 y.o.   MRN: [id number]  HPI Comments: 58 yo WF presents for 3 month F/U for HTN, Cholesterol, Pre-Dm, D. deficient  She has multiple bladder surgeries to remove sling, with partial bladder removal, post-op complications with adhesions/ infections over the last several months. She has not been doing a lot of exercise due to the bladder surgeries. She is eating healthy for the most part. She is eating smaller portions and decreased salt. She has been more fatigued since surgeries.   WBC             7.7   03/23/2014 HGB            13.0   03/23/2014 HCT            38.7   03/23/2014 PLT             290   03/23/2014 GLUCOSE          91   03/23/2014 CHOL            177   12/10/2013 TRIG             92   12/10/2013 HDL              59   12/10/2013 LDLCALC         100   12/10/2013 ALT              22   01/21/2014 AST              21   01/21/2014 NA              141   03/23/2014 K               3.8   03/23/2014 CL              100   03/23/2014 CREATININE     0.64   03/23/2014 BUN              10   03/23/2014 CO2              28   03/23/2014 TSH           3.509   12/10/2013 HGBA1C          5.8   12/10/2013   Hyperlipidemia  Hypertension     Medication List       This list is accurate as of: 05/28/14 12:24 PM.  Always use your most recent med list.               ALPRAZolam 0.5 MG tablet  Commonly known as:  XANAX  Take 0.5 mg by mouth 3 (three) times daily as needed for sleep.     cetirizine 10 MG tablet  Commonly known as:  ZYRTEC  Take 10 mg by mouth daily as needed for allergies.     fluticasone 50 MCG/ACT nasal spray  Commonly known as:  FLONASE  Place 2 sprays into both nostrils daily at 12 noon.     GERITABS PO  Take 1 tablet by mouth daily.     hydrochlorothiazide 25 MG tablet  Commonly known as:  HYDRODIURIL  TAKE ONE TABLET BY MOUTH EVERY DAY     HYDROcodone-acetaminophen 5-325 MG per tablet  Commonly known  as:  NORCO  Take 1/2 to 1 tablet every 4 hours as needed for severe pain  levothyroxine 88 MCG tablet  Commonly known as:  SYNTHROID, LEVOTHROID  Take 44-88 mcg by mouth daily. Take half a tablet on all days with exception  Whole tab on Tuesday and Thursday     meloxicam 7.5 MG tablet  Commonly known as:  MOBIC  Take 7.5 mg by mouth daily.     MUCINEX 600 MG 12 hr tablet  Generic drug:  guaiFENesin  Take 600 mg by mouth 2 (two) times daily as needed.     ondansetron 4 MG tablet  Commonly known as:  ZOFRAN  Take 1 tablet (4 mg total) by mouth every 8 (eight) hours as needed for nausea (may repeat dose in 1 hr if no improvement).     potassium chloride SA 20 MEQ tablet  Commonly known as:  K-DUR,KLOR-CON  Take 20 mEq by mouth daily. WITH LUNCH     ranitidine 300 MG tablet  Commonly known as:  ZANTAC  TAKE ONE TABLET BY MOUTH TWICE DAILY     rosuvastatin 40 MG tablet  Commonly known as:  CRESTOR  Take 40 mg by mouth daily.     sucralfate 1 G tablet  Commonly known as:  CARAFATE  Take 1 g by mouth as needed.     SUPER B COMPLEX PO  Take 1 tablet by mouth daily.     triazolam 0.25 MG tablet  Commonly known as:  HALCION  TAKE ONE TABLET BY MOUTH ONCE DAILY AT BEDTIME AS NEEDED FOR SLEEP     Vitamin D-3 5000 UNITS Tabs  Take 1 tablet by mouth daily.       Allergies  Allergen Reactions  . Shellfish Allergy Diarrhea and Nausea And Vomiting   Past Medical History  Diagnosis Date  . Hypertension   . Fibromyalgia   . Hypothyroidism   . GERD (gastroesophageal reflux disease)   . DDD (degenerative disc disease), lumbar   . Anxiety   . Depression   . Erosion of vaginal mesh   . Rectocele   . History of gastric ulcer        Review of Systems  Constitutional: Positive for fatigue.  All other systems reviewed and are negative.  BP 122/74  Pulse 76  Temp(Src) 97.7 F (36.5 C) (Temporal)  Resp 16  Ht 5\' 3"  (1.6 m)  Wt 158 lb 12.8 oz (72.031 kg)  BMI 28.14  kg/m2     Objective:   Physical Exam  Nursing note and vitals reviewed. Constitutional: She is oriented to person, place, and time. She appears well-developed and well-nourished. No distress.  HENT:  Head: Normocephalic and atraumatic.  Right Ear: External ear normal.  Left Ear: External ear normal.  Nose: Nose normal.  Eyes: Conjunctivae and EOM are normal.  Neck: Normal range of motion. Neck supple. No JVD present. No thyromegaly present.  Cardiovascular: Normal rate, regular rhythm, normal heart sounds and intact distal pulses.   Pulmonary/Chest: Effort normal and breath sounds normal.  Abdominal: Soft. Bowel sounds are normal. She exhibits no distension and no mass. There is tenderness. There is no rebound and no guarding.  Mild low abdomen  Musculoskeletal: Normal range of motion. She exhibits no edema and no tenderness.  Lymphadenopathy:    She has no cervical adenopathy.  Neurological: She is alert and oriented to person, place, and time. No cranial nerve deficit.  Skin: Skin is warm and dry. No rash noted. No erythema. No pallor.  Psychiatric: She has a normal mood and affect. Her behavior is normal. Judgment  and thought content normal.          Assessment & Plan:  1.  3 month F/U for HTN, Cholesterol, Pre-Dm, D. Deficient. Needs healthy diet, cardio QD and obtain healthy weight. Check Labs, Check BP if >130/80 call office   2. Hypothyroid/ Fatigue- check labs, increase activity and H2O, Trial of Branded Synthroid 88 mcg sx #3 boxes given to see if any change, may ref to Enocrine if no improvement with symptoms  Vs fatigue with chronic infections with bladder/ surgeries.

## 2014-05-29 LAB — CBC WITH DIFFERENTIAL/PLATELET
Basophils Absolute: 0 10*3/uL (ref 0.0–0.1)
Basophils Relative: 0 % (ref 0–1)
EOS PCT: 2 % (ref 0–5)
Eosinophils Absolute: 0.2 10*3/uL (ref 0.0–0.7)
HCT: 37.9 % (ref 36.0–46.0)
Hemoglobin: 12.8 g/dL (ref 12.0–15.0)
LYMPHS PCT: 50 % — AB (ref 12–46)
Lymphs Abs: 4.2 10*3/uL — ABNORMAL HIGH (ref 0.7–4.0)
MCH: 27.6 pg (ref 26.0–34.0)
MCHC: 33.8 g/dL (ref 30.0–36.0)
MCV: 81.9 fL (ref 78.0–100.0)
Monocytes Absolute: 0.5 10*3/uL (ref 0.1–1.0)
Monocytes Relative: 6 % (ref 3–12)
NEUTROS ABS: 3.5 10*3/uL (ref 1.7–7.7)
Neutrophils Relative %: 42 % — ABNORMAL LOW (ref 43–77)
PLATELETS: 321 10*3/uL (ref 150–400)
RBC: 4.63 MIL/uL (ref 3.87–5.11)
RDW: 13.7 % (ref 11.5–15.5)
WBC: 8.3 10*3/uL (ref 4.0–10.5)

## 2014-05-29 LAB — HEPATIC FUNCTION PANEL
ALT: 17 U/L (ref 0–35)
AST: 19 U/L (ref 0–37)
Albumin: 4.2 g/dL (ref 3.5–5.2)
Alkaline Phosphatase: 74 U/L (ref 39–117)
BILIRUBIN DIRECT: 0.1 mg/dL (ref 0.0–0.3)
BILIRUBIN INDIRECT: 0.2 mg/dL (ref 0.2–1.2)
Total Bilirubin: 0.3 mg/dL (ref 0.2–1.2)
Total Protein: 6.4 g/dL (ref 6.0–8.3)

## 2014-05-29 LAB — LIPID PANEL
CHOLESTEROL: 155 mg/dL (ref 0–200)
HDL: 54 mg/dL (ref >39)
LDL CALC: 75 mg/dL (ref 0–99)
Total CHOL/HDL Ratio: 2.9 Ratio
Triglycerides: 130 mg/dL (ref <150)
VLDL: 26 mg/dL (ref 0–40)

## 2014-05-29 LAB — BASIC METABOLIC PANEL WITH GFR
BUN: 9 mg/dL (ref 6–23)
CHLORIDE: 102 meq/L (ref 96–112)
CO2: 30 meq/L (ref 19–32)
Calcium: 9.5 mg/dL (ref 8.4–10.5)
Creat: 0.62 mg/dL (ref 0.50–1.10)
GFR, Est African American: >89 mL/min
Glucose, Bld: 84 mg/dL (ref 70–99)
POTASSIUM: 4.5 meq/L (ref 3.5–5.3)
Sodium: 142 mEq/L (ref 135–145)

## 2014-05-29 LAB — HEMOGLOBIN A1C
Hgb A1c MFr Bld: 5.5 % (ref <5.7)
MEAN PLASMA GLUCOSE: 111 mg/dL (ref <117)

## 2014-05-29 LAB — INSULIN, FASTING: INSULIN FASTING, SERUM: 17 u[IU]/mL (ref 3–28)

## 2014-05-29 LAB — TSH: TSH: 6.079 u[IU]/mL — ABNORMAL HIGH (ref 0.350–4.500)

## 2014-06-19 ENCOUNTER — Other Ambulatory Visit: Payer: Self-pay | Admitting: Physician Assistant

## 2014-06-19 ENCOUNTER — Other Ambulatory Visit: Payer: Self-pay | Admitting: Internal Medicine

## 2014-06-23 ENCOUNTER — Other Ambulatory Visit: Payer: Self-pay | Admitting: Internal Medicine

## 2014-06-27 ENCOUNTER — Other Ambulatory Visit: Payer: Self-pay | Admitting: Internal Medicine

## 2014-07-08 ENCOUNTER — Other Ambulatory Visit: Payer: Self-pay

## 2014-07-08 MED ORDER — LEVOTHYROXINE SODIUM 88 MCG PO TABS: 88.0000 ug | ORAL_TABLET | Freq: Every day | ORAL | Status: DC

## 2014-07-13 ENCOUNTER — Telehealth: Payer: Self-pay | Admitting: *Deleted

## 2014-07-13 NOTE — Telephone Encounter (Signed)
Patient called and requested RX for brand Synthroid.  She had tried samples to see if brand improved the way she was feeling. Per Dr Melford Aase, return to generic which is just effective and on her insurance.

## 2014-08-19 ENCOUNTER — Emergency Department (HOSPITAL_COMMUNITY)
Admission: EM | Admit: 2014-08-19 | Discharge: 2014-08-19 | Disposition: A | Discharge status: Home / Self Care | Payer: Medicare Other | Source: Home / Self Care | Attending: Family Medicine | Admitting: Family Medicine

## 2014-08-19 ENCOUNTER — Encounter (HOSPITAL_COMMUNITY): Payer: Self-pay | Admitting: Emergency Medicine

## 2014-08-19 DIAGNOSIS — L0100 Impetigo, unspecified: Secondary | ICD-10-CM

## 2014-08-19 DIAGNOSIS — R21 Rash and other nonspecific skin eruption: Secondary | ICD-10-CM

## 2014-08-19 MED ORDER — FLUCONAZOLE 150 MG PO TABS: 150.0000 mg | ORAL_TABLET | Freq: Every day | ORAL | Status: DC

## 2014-08-19 MED ORDER — ACYCLOVIR 200 MG PO CAPS: 200.0000 mg | ORAL_CAPSULE | Freq: Every day | ORAL | Status: DC

## 2014-08-19 MED ORDER — MUPIROCIN 2 % EX OINT: 1.0000 "application " | TOPICAL_OINTMENT | Freq: Two times a day (BID) | CUTANEOUS | Status: DC

## 2014-08-19 MED ORDER — CEPHALEXIN 500 MG PO CAPS: 500.0000 mg | ORAL_CAPSULE | Freq: Two times a day (BID) | ORAL | Status: DC

## 2014-08-19 NOTE — ED Provider Notes (Signed)
CSN: [id number]     Arrival date & time 08/19/14  1500 History   First MD Initiated Contact with Patient 08/19/14 1542     Chief Complaint  Patient presents with  . Rash   (Consider location/radiation/quality/duration/timing/severity/associated sxs/prior Treatment) HPI  Rash: started 4 days ago. Started behind R ear. Initially seemed like acne. Now w/ more spots on neck and R face. Cortisone cream w/o beneift. Hydrocodone pills to ease pain. No discharge. Denies fevers. Occasionally itchy. Getting worse. Spends time outdoors from time to time but no known exposure to poison ivy/oak. Denies new soaps lotions or detergents.    Past Medical History  Diagnosis Date  . Hypertension   . Fibromyalgia   . Hypothyroidism   . GERD (gastroesophageal reflux disease)   . DDD (degenerative disc disease), lumbar   . Anxiety   . Depression   . Erosion of vaginal mesh   . Rectocele   . History of gastric ulcer    Past Surgical History  Procedure Laterality Date  . Incontinence surgery  2009  &  2005  . Bladder tack  2003  . Breast lumpectomy  1985  . Vein bypass surgery  AGE 58    RIGHT LEG  . Transthoracic echocardiogram  06-16-2013    GRADE I DIASTOLIC DYSFUNCTION/  EF 55-60%/  MILD AR/  TRIVIAL MR  &  TR  . Cardiovascular stress test  07-02-2013  [facility]    LOW RISK NUCLEAR STUDY WITH A SMALL, MILD APICAL SEPTAL FIXED PERFUSION DEFECT .  GIVEN NORMAL WALL FUNCTION , SUSPECT REPRESENTS ATTENUATION/  EF 74%/  NO ISCHEMIA  . Cysto/  hydrodistention/  bladder bx/   instillation therapy  08-29-2010  . Vaginal hysterectomy  2000    w/  unilateral salpingoophorectomy  . Cataract extraction w/ intraocular lens implant Left 01/2014  . Excision of mesh N/A 03/23/2014    Procedure: EXCISION OF VAGINAL AND PERI-URETHRAL MESH, EXTRACTION FROM URETHRA TO APEX, insertion of XENFORM in the vaginal vault;  Surgeon: Ailene Rud, MD;  Location: [facility];  Service: Urology;   Laterality: N/A;  . Excision vaginal cyst N/A 04/06/2014    Procedure: VAGINAL SPECULUM EXAMINATION/POSSIBLE DRAINAGE OF HEMATOMA;  Surgeon: Ailene Rud, MD;  Location: [facility];  Service: Urology;  Laterality: N/A;   No family history on file. History  Substance Use Topics  . Smoking status: Former Smoker -- 20 years    Types: Cigarettes    Quit date: 11/28/1995  . Smokeless tobacco: Current User    Types: Snuff     Comment: 4oz can last 14-16days  . Alcohol Use: No   OB History   Grav Para Term Preterm Abortions TAB SAB Ect Mult Living                 Review of Systems Per HPI with all other pertinent systems negative.   Allergies  Shellfish allergy  Home Medications   Prior to Admission medications   Medication Sig Start Date End Date Taking? Authorizing Provider  ALPRAZolam Duanne Moron) 0.5 MG tablet TAKE ONE TABLET BY MOUTH  UP TO THREE TIMES A  DAY AS NEEDED FOR ANXIETY 06/19/14  Yes Vicie Mutters, PA-C  levothyroxine (SYNTHROID, LEVOTHROID) 88 MCG tablet Take 1 tablet (88 mcg total) by mouth daily before breakfast. 07/08/14  Yes Unk Pinto, MD  potassium chloride SA (K-DUR,KLOR-CON) 20 MEQ tablet Take 20 mEq by mouth daily. WITH LUNCH   Yes Historical Provider, MD  ranitidine (ZANTAC) 300 MG tablet TAKE ONE TABLET BY MOUTH TWICE DAILY 12/04/13  Yes Unk Pinto, MD  triazolam (HALCION) 0.25 MG tablet TAKE ONE TABLET BY MOUTH AT BEDTIME. 06/19/14 09/19/14 Yes Unk Pinto, MD  acyclovir (ZOVIRAX) 200 MG capsule Take 1 capsule (200 mg total) by mouth 5 (five) times daily. 08/19/14   Waldemar Dickens, MD  B Complex-C (SUPER B COMPLEX PO) Take 1 tablet by mouth daily.     Historical Provider, MD  cephALEXin (KEFLEX) 500 MG capsule Take 1 capsule (500 mg total) by mouth 2 (two) times daily. 08/19/14   Waldemar Dickens, MD  cetirizine (ZYRTEC) 10 MG tablet Take 10 mg by mouth daily as needed for allergies.    Historical Provider, MD  Cholecalciferol  (VITAMIN D-3) 5000 UNITS TABS Take 1 tablet by mouth daily.    Historical Provider, MD  CRESTOR 20 MG tablet TAKE ONE TABLET BY MOUTH ONCE DAILY FOR CHOLESTEROL. 06/27/14   Vicie Mutters, PA-C  fluconazole (DIFLUCAN) 150 MG tablet Take 1 tablet (150 mg total) by mouth daily. Repeat dose in 3 days 08/19/14   Waldemar Dickens, MD  fluticasone [facility]) 50 MCG/ACT nasal spray Place 2 sprays into both nostrils daily at 12 noon. 03/17/14 03/17/15  Unk Pinto, MD  guaiFENesin (MUCINEX) 600 MG 12 hr tablet Take 600 mg by mouth 2 (two) times daily as needed.    Historical Provider, MD  hydrochlorothiazide (HYDRODIURIL) 25 MG tablet TAKE ONE TABLET BY MOUTH EVERY DAY 04/08/14   Vicie Mutters, PA-C  HYDROcodone-acetaminophen [facility]) 5-325 MG per tablet Take 1/2 to 1 tablet every 4 hours as needed for severe pain 03/17/14   Unk Pinto, MD  meloxicam (MOBIC) 7.5 MG tablet Take 7.5 mg by mouth daily.    Historical Provider, MD  Multiple Vitamins-Minerals (GERITABS PO) Take 1 tablet by mouth daily.    Historical Provider, MD  mupirocin ointment (BACTROBAN) 2 % Apply 1 application topically 2 (two) times daily. Apply for 3-5 days 08/19/14   Waldemar Dickens, MD  ondansetron (ZOFRAN) 4 MG tablet Take 1 tablet (4 mg total) by mouth every 8 (eight) hours as needed for nausea (may repeat dose in 1 hr if no improvement). 04/06/14   Ailene Rud, MD  rosuvastatin (CRESTOR) 40 MG tablet Take 1/2 to 1 tablet daily or as directed for cholesterol. 06/23/14   Unk Pinto, MD  sucralfate (CARAFATE) 1 G tablet Take 1 g by mouth as needed.  09/18/13   Historical Provider, MD   BP 146/91  Pulse 109  Temp(Src) 98.2 F (36.8 C) (Oral)  Resp 16  SpO2 97% Physical Exam  Constitutional: She is oriented to person, place, and time. She appears well-developed and well-nourished. No distress.  HENT:  Head: Normocephalic and atraumatic.  Eyes: EOM are normal. Pupils are equal, round, and reactive to light.  Neck:  Normal range of motion.  Cardiovascular: Normal rate, normal heart sounds and intact distal pulses.   No murmur heard. Pulmonary/Chest: Effort normal and breath sounds normal.  Abdominal: Soft.  Musculoskeletal: Normal range of motion.  Neurological: She is alert and oriented to person, place, and time. No cranial nerve deficit.  Skin: She is not diaphoretic.  R posterior auricular adn R cheek erythematous patches w/ intermittent bubbling and honey crusting. Few macular papular lesions in hair on R occipital region.   Psychiatric: She has a normal mood and affect. Her behavior is normal. Judgment and thought content normal.    ED Course  Procedures (including critical care time) Labs Review Labs Reviewed - No data to display  Imaging Review No results found.   MDM   1. Impetigo   2. Rash    Mupirocin ointment BID x 3-5 days Keflex if needed if scalp lesions do not resolve Diflucan if develops vaginal infection Remote chance that rash may be related to shingles - acyclovir if no improvement  Precautions given and all questions answered  Linna Darner, MD Family Medicine 08/19/2014, 4:06 PM     Waldemar Dickens, MD 08/19/14 1606

## 2014-08-19 NOTE — ED Notes (Addendum)
C/o rash on scalp and on face onset 5 days Reports shooting pain that last for a few seconds Took Vicodin and benadryl yest  Denies fevers and chills Alert, no signs of acute distress.

## 2014-08-19 NOTE — Discharge Instructions (Signed)
You likely are suffering from impetigo or a mild bacterial skin infection This should improve with mupirocin oinment. The scalp spots may take Keflex to fully resolve Please take the diflucan if you develop vaginal irritation Please start the acyclovir if your pain adn the rash worsen

## 2014-08-21 ENCOUNTER — Encounter (HOSPITAL_COMMUNITY): Payer: Self-pay | Admitting: Emergency Medicine

## 2014-08-21 ENCOUNTER — Emergency Department (INDEPENDENT_AMBULATORY_CARE_PROVIDER_SITE_OTHER)
Admission: EM | Admit: 2014-08-21 | Discharge: 2014-08-21 | Disposition: A | Discharge status: Home / Self Care | Payer: Medicare Other | Source: Home / Self Care

## 2014-08-21 DIAGNOSIS — B029 Zoster without complications: Secondary | ICD-10-CM

## 2014-08-21 NOTE — ED Notes (Signed)
Rash on right side of face and neck. States the rash seems to be getting worse

## 2014-08-21 NOTE — ED Provider Notes (Signed)
CSN: [id number]     Arrival date & time 08/21/14  1522 History   First MD Initiated Contact with Patient 08/21/14 1616     Chief Complaint  Patient presents with  . Rash   (Consider location/radiation/quality/duration/timing/severity/associated sxs/prior Treatment) HPI Comments:  58 year old female returns for reevaluation of the rash on the right side of her face and scalp. She was seen in the urgent care 2 days ago and treated with Keflex, mupirocin and a prescription for Zovirax. She wanted to make sure that this was shingles before she started the antivirals. She describes very painful and tender burning stinging rash to the right face and scalp. She is taking one half Norco for pain.   Past Medical History  Diagnosis Date  . Hypertension   . Fibromyalgia   . Hypothyroidism   . GERD (gastroesophageal reflux disease)   . DDD (degenerative disc disease), lumbar   . Anxiety   . Depression   . Erosion of vaginal mesh   . Rectocele   . History of gastric ulcer    Past Surgical History  Procedure Laterality Date  . Incontinence surgery  2009  &  2005  . Bladder tack  2003  . Breast lumpectomy  1985  . Vein bypass surgery  AGE 30    RIGHT LEG  . Transthoracic echocardiogram  06-16-2013    GRADE I DIASTOLIC DYSFUNCTION/  EF 55-60%/  MILD AR/  TRIVIAL MR  &  TR  . Cardiovascular stress test  07-02-2013  [facility]    LOW RISK NUCLEAR STUDY WITH A SMALL, MILD APICAL SEPTAL FIXED PERFUSION DEFECT .  GIVEN NORMAL WALL FUNCTION , SUSPECT REPRESENTS ATTENUATION/  EF 74%/  NO ISCHEMIA  . Cysto/  hydrodistention/  bladder bx/   instillation therapy  08-29-2010  . Vaginal hysterectomy  2000    w/  unilateral salpingoophorectomy  . Cataract extraction w/ intraocular lens implant Left 01/2014  . Excision of mesh N/A 03/23/2014    Procedure: EXCISION OF VAGINAL AND PERI-URETHRAL MESH, EXTRACTION FROM URETHRA TO APEX, insertion of XENFORM in the vaginal vault;  Surgeon: Ailene Rud, MD;  Location: [facility];  Service: Urology;  Laterality: N/A;  . Excision vaginal cyst N/A 04/06/2014    Procedure: VAGINAL SPECULUM EXAMINATION/POSSIBLE DRAINAGE OF HEMATOMA;  Surgeon: Ailene Rud, MD;  Location: [facility];  Service: Urology;  Laterality: N/A;   History reviewed. No pertinent family history. History  Substance Use Topics  . Smoking status: Former Smoker -- 20 years    Types: Cigarettes    Quit date: 11/28/1995  . Smokeless tobacco: Current User    Types: Snuff     Comment: 4oz can last 14-16days  . Alcohol Use: No   OB History   Grav Para Term Preterm Abortions TAB SAB Ect Mult Living                 Review of Systems  Constitutional: Negative.  Negative for fever.  HENT: Negative.   Respiratory: Negative for cough and shortness of breath.   Cardiovascular: Negative for chest pain.  Gastrointestinal: Negative.   Genitourinary: Negative.   Skin: Positive for rash.  Neurological: Negative for headaches.    Allergies  Shellfish allergy  Home Medications   Prior to Admission medications   Medication Sig Start Date End Date Taking? Authorizing Provider  acyclovir (ZOVIRAX) 200 MG capsule Take 1 capsule (200 mg total) by mouth 5 (five) times daily. 08/19/14  Waldemar Dickens, MD  ALPRAZolam Duanne Moron) 0.5 MG tablet TAKE ONE TABLET BY MOUTH  UP TO THREE TIMES A  DAY AS NEEDED FOR ANXIETY 06/19/14   Vicie Mutters, PA-C  B Complex-C (SUPER B COMPLEX PO) Take 1 tablet by mouth daily.     Historical Provider, MD  cephALEXin (KEFLEX) 500 MG capsule Take 1 capsule (500 mg total) by mouth 2 (two) times daily. 08/19/14   Waldemar Dickens, MD  cetirizine (ZYRTEC) 10 MG tablet Take 10 mg by mouth daily as needed for allergies.    Historical Provider, MD  Cholecalciferol (VITAMIN D-3) 5000 UNITS TABS Take 1 tablet by mouth daily.    Historical Provider, MD  CRESTOR 20 MG tablet TAKE ONE TABLET BY MOUTH ONCE DAILY FOR  CHOLESTEROL. 06/27/14   Vicie Mutters, PA-C  fluconazole (DIFLUCAN) 150 MG tablet Take 1 tablet (150 mg total) by mouth daily. Repeat dose in 3 days 08/19/14   Waldemar Dickens, MD  fluticasone [facility]) 50 MCG/ACT nasal spray Place 2 sprays into both nostrils daily at 12 noon. 03/17/14 03/17/15  Unk Pinto, MD  guaiFENesin (MUCINEX) 600 MG 12 hr tablet Take 600 mg by mouth 2 (two) times daily as needed.    Historical Provider, MD  hydrochlorothiazide (HYDRODIURIL) 25 MG tablet TAKE ONE TABLET BY MOUTH EVERY DAY 04/08/14   Vicie Mutters, PA-C  HYDROcodone-acetaminophen [facility]) 5-325 MG per tablet Take 1/2 to 1 tablet every 4 hours as needed for severe pain 03/17/14   Unk Pinto, MD  levothyroxine (SYNTHROID, LEVOTHROID) 88 MCG tablet Take 1 tablet (88 mcg total) by mouth daily before breakfast. 07/08/14   Unk Pinto, MD  meloxicam (MOBIC) 7.5 MG tablet Take 7.5 mg by mouth daily.    Historical Provider, MD  Multiple Vitamins-Minerals (GERITABS PO) Take 1 tablet by mouth daily.    Historical Provider, MD  mupirocin ointment (BACTROBAN) 2 % Apply 1 application topically 2 (two) times daily. Apply for 3-5 days 08/19/14   Waldemar Dickens, MD  ondansetron (ZOFRAN) 4 MG tablet Take 1 tablet (4 mg total) by mouth every 8 (eight) hours as needed for nausea (may repeat dose in 1 hr if no improvement). 04/06/14   Ailene Rud, MD  potassium chloride SA (K-DUR,KLOR-CON) 20 MEQ tablet Take 20 mEq by mouth daily. WITH LUNCH    Historical Provider, MD  ranitidine (ZANTAC) 300 MG tablet TAKE ONE TABLET BY MOUTH TWICE DAILY 12/04/13   Unk Pinto, MD  rosuvastatin (CRESTOR) 40 MG tablet Take 1/2 to 1 tablet daily or as directed for cholesterol. 06/23/14   Unk Pinto, MD  sucralfate (CARAFATE) 1 G tablet Take 1 g by mouth as needed.  09/18/13   Historical Provider, MD  triazolam (HALCION) 0.25 MG tablet TAKE ONE TABLET BY MOUTH AT BEDTIME. 06/19/14 09/19/14  Unk Pinto, MD   BP 131/71  Pulse  84  Temp(Src) 98.4 F (36.9 C) (Oral)  Resp 16  SpO2 98% Physical Exam  Nursing note and vitals reviewed. Constitutional: She is oriented to person, place, and time. She appears well-developed and well-nourished. No distress.  Eyes: Conjunctivae and EOM are normal.  Eyes are unaffected with herpetic lesions or discoloration. In no pain or tenderness.  Neck: Normal range of motion. Neck supple.  Pulmonary/Chest: Effort normal. No respiratory distress.  Neurological: She is alert and oriented to person, place, and time. She exhibits normal muscle tone.  Skin: Skin is warm and dry.  Papular vesicular lesions over a red base to the  right side of the face and the right scalp. Are also lesions to the right posterior regular area. These areas are tender. Light touch of the hair and scalp pieces the burning and stinging pain.    ED Course  Procedures (including critical care time) Labs Review Labs Reviewed - No data to display  Imaging Review No results found.   MDM   1. Herpes zoster     Start Zovirax now Norco as needed Dont need the mupirocin now. Return for worsening or eye involvement.      Janne Napoleon, NP 08/21/14 [phone]

## 2014-08-21 NOTE — Discharge Instructions (Signed)
Shingles Shingles (herpes zoster) is an infection that is caused by the same virus that causes chickenpox (varicella). The infection causes a painful skin rash and fluid-filled blisters, which eventually break open, crust over, and heal. It may occur in any area of the body, but it usually affects only one side of the body or face. The pain of shingles usually lasts about 1 month. However, some people with shingles may develop long-term (chronic) pain in the affected area of the body. Shingles often occurs many years after the person had chickenpox. It is more common:  In people older than 50 years.  In people with weakened immune systems, such as those with HIV, AIDS, or cancer.  In people taking medicines that weaken the immune system, such as transplant medicines.  In people under great stress. CAUSES  Shingles is caused by the varicella zoster virus (VZV), which also causes chickenpox. After a person is infected with the virus, it can remain in the person's body for years in an inactive state (dormant). To cause shingles, the virus reactivates and breaks out as an infection in a nerve root. The virus can be spread from person to person (contagious) through contact with open blisters of the shingles rash. It will only spread to people who have not had chickenpox. When these people are exposed to the virus, they may develop chickenpox. They will not develop shingles. Once the blisters scab over, the person is no longer contagious and cannot spread the virus to others. SIGNS AND SYMPTOMS  Shingles shows up in stages. The initial symptoms may be pain, itching, and tingling in an area of the skin. This pain is usually described as burning, stabbing, or throbbing.In a few days or weeks, a painful red rash will appear in the area where the pain, itching, and tingling were felt. The rash is usually on one side of the body in a band or belt-like pattern. Then, the rash usually turns into fluid-filled  blisters. They will scab over and dry up in approximately 2-3 weeks. Flu-like symptoms may also occur with the initial symptoms, the rash, or the blisters. These may include:  Fever.  Chills.  Headache.  Upset stomach. DIAGNOSIS  Your health care provider will perform a skin exam to diagnose shingles. Skin scrapings or fluid samples may also be taken from the blisters. This sample will be examined under a microscope or sent to a lab for further testing. TREATMENT  There is no specific cure for shingles. Your health care provider will likely prescribe medicines to help you manage the pain, recover faster, and avoid long-term problems. This may include antiviral drugs, anti-inflammatory drugs, and pain medicines. HOME CARE INSTRUCTIONS   Take a cool bath or apply cool compresses to the area of the rash or blisters as directed. This may help with the pain and itching.   Take medicines only as directed by your health care provider.   Rest as directed by your health care provider.  Keep your rash and blisters clean with mild soap and cool water or as directed by your health care provider.  Do not pick your blisters or scratch your rash. Apply an anti-itch cream or numbing creams to the affected area as directed by your health care provider.  Keep your shingles rash covered with a loose bandage (dressing).  Avoid skin contact with:  Babies.   Pregnant women.   Children with eczema.   Elderly people with transplants.   People with chronic illnesses, such as leukemia  or AIDS.   °· Wear loose-fitting clothing to help ease the pain of material rubbing against the rash. °· Keep all follow-up visits as directed by your health care provider. If the area involved is on your face, you may receive a referral for a specialist, such as an eye doctor (ophthalmologist) or an ear, nose, and throat (ENT) doctor. Keeping all follow-up visits will help you avoid eye problems, chronic pain, or  disability.   °SEEK IMMEDIATE MEDICAL CARE IF:  °· You have facial pain, pain around the eye area, or loss of feeling on one side of your face. °· You have ear pain or ringing in your ear. °· You have loss of taste. °· Your pain is not relieved with prescribed medicines.   °· Your redness or swelling spreads.   °· You have more pain and swelling.  °· Your condition is worsening or has changed.   °· You have a fever. °MAKE SURE YOU: °· Understand these instructions. °· Will watch your condition. °· Will get help right away if you are not doing well or get worse. °Document Released: 11/13/2005 Document Revised: 03/30/2014 Document Reviewed: 06/27/2012 °[employer]® Patient Information ©2015 [employer]. This information is not intended to replace advice given to you by your health care provider. Make sure you discuss any questions you have with your health care provider. ° °Neuropathic Pain °We often think that pain has a physical cause. If we get rid of the cause, the pain should go away. Nerves themselves can also cause pain. It is called neuropathic pain, which means nerve abnormality. It may be difficult for the patients who have it and for the treating caregivers. Pain is usually described as acute (short-lived) or chronic (long-lasting). Acute pain is related to the physical sensations caused by an injury. It can last from a few seconds to many weeks, but it usually goes away when normal healing occurs. Chronic pain lasts beyond the typical healing time. With neuropathic pain, the nerve fibers themselves may be damaged or injured. They then send incorrect signals to other pain centers. The pain you feel is real, but the cause is not easy to find.  °CAUSES  °Chronic pain can result from diseases, such as diabetes and shingles (an infection related to chickenpox), or from trauma, surgery, or amputation. It can also happen without any known injury or disease. The nerves are sending pain messages, even though there  is no identifiable cause for such messages.  °· Other common causes of neuropathy include diabetes, phantom limb pain, or Regional Pain Syndrome (RPS). °· As with all forms of chronic back pain, if neuropathy is not correctly treated, there can be a number of associated problems that lead to a downward cycle for the patient. These include depression, sleeplessness, feelings of fear and anxiety, limited social interaction and inability to do normal daily activities or work. °· The most dramatic and mysterious example of neuropathic pain is called "phantom limb syndrome." This occurs when an arm or a leg has been removed because of illness or injury. The brain still gets pain messages from the nerves that originally carried impulses from the missing limb. These nerves now seem to misfire and cause troubling pain. °· Neuropathic pain often seems to have no cause. It responds poorly to standard pain treatment. °Neuropathic pain can occur after: °· Shingles (herpes zoster virus infection). °· A lasting burning sensation of the skin, caused usually by injury to a peripheral nerve. °· Peripheral neuropathy which is widespread nerve damage, often caused by diabetes or   alcoholism. °· Phantom limb pain following an amputation. °· Facial nerve problems (trigeminal neuralgia). °· Multiple sclerosis. °· Reflex sympathetic dystrophy. °· Pain which comes with cancer and cancer chemotherapy. °· Entrapment neuropathy such as when pressure is put on a nerve such as in carpal tunnel syndrome. °· Back, leg, and hip problems (sciatica). °· Spine or back surgery. °· HIV Infection or AIDS where nerves are infected by viruses. °Your caregiver can explain items in the above list which may apply to you. °SYMPTOMS  °Characteristics of neuropathic pain are: °· Severe, sharp, electric shock-like, shooting, lightening-like, knife-like. °· Pins and needles sensation. °· Deep burning, deep cold, or deep ache. °· Persistent numbness, tingling, or  weakness. °· Pain resulting from light touch or other stimulus that would not usually cause pain. °· Increased sensitivity to something that would normally cause pain, such as a pinprick. °Pain may persist for months or years following the healing of damaged tissues. When this happens, pain signals no longer sound an alarm about current injuries or injuries about to happen. Instead, the alarm system itself is not working correctly.  °Neuropathic pain may get worse instead of better over time. For some people, it can lead to serious disability. It is important to be aware that severe injury in a limb can occur without a proper, protective pain response. Burns, cuts, and other injuries may go unnoticed. Without proper treatment, these injuries can become infected or lead to further disability. Take any injury seriously, and consult your caregiver for treatment. °DIAGNOSIS  °When you have a pain with no known cause, your caregiver will probably ask some specific questions:  °· Do you have any other conditions, such as diabetes, shingles, multiple sclerosis, or HIV infection? °· How would you describe your pain? (Neuropathic pain is often described as shooting, stabbing, burning, or searing.) °· Is your pain worse at any time of the day? (Neuropathic pain is usually worse at night.) °· Does the pain seem to follow a certain physical pathway? °· Does the pain come from an area that has missing or injured nerves? (An example would be phantom limb pain.) °· Is the pain triggered by minor things such as rubbing against the sheets at night? °These questions often help define the type of pain involved. Once your caregiver knows what is happening, treatment can begin. Anticonvulsant, antidepressant drugs, and various pain relievers seem to work in some cases. If another condition, such as diabetes is involved, better management of that disorder may relieve the neuropathic pain.  °TREATMENT  °Neuropathic pain is frequently  long-lasting and tends not to respond to treatment with narcotic type pain medication. It may respond well to other drugs such as antiseizure and antidepressant medications. Usually, neuropathic problems do not completely go away, but partial improvement is often possible with proper treatment. Your caregivers have large numbers of medications available to treat you. Do not be discouraged if you do not get immediate relief. Sometimes different medications or a combination of medications will be tried before you receive the results you are hoping for. See your caregiver if you have pain that seems to be coming from nowhere and does not go away. Help is available.  °SEEK IMMEDIATE MEDICAL CARE IF:  °· There is a sudden change in the quality of your pain, especially if the change is on only one side of the body. °· You notice changes of the skin, such as redness, black or purple discoloration, swelling, or an ulcer. °· You cannot move the affected   limbs. °Document Released: 08/10/2004 Document Revised: 02/05/2012 Document Reviewed: 08/10/2004 °[employer]® Patient Information ©2015 [employer]. This information is not intended to replace advice given to you by your health care provider. Make sure you discuss any questions you have with your health care provider. ° °

## 2014-08-22 NOTE — ED Provider Notes (Signed)
Medical screening examination/treatment/procedure(s) were performed by resident physician or non-physician practitioner and as supervising physician I was immediately available for consultation/collaboration.   Pauline Good MD.   Billy Fischer, MD 08/22/14 [phone]

## 2014-08-24 ENCOUNTER — Telehealth: Payer: Self-pay | Admitting: *Deleted

## 2014-08-24 ENCOUNTER — Other Ambulatory Visit: Payer: Self-pay | Admitting: Internal Medicine

## 2014-08-24 MED ORDER — HYDROCODONE-ACETAMINOPHEN 5-325 MG PO TABS: ORAL_TABLET | ORAL | Status: AC

## 2014-08-24 NOTE — Telephone Encounter (Signed)
Refill for Hydrocodone ready at patient pick up.  OK for spouse to pick up.

## 2014-09-18 ENCOUNTER — Other Ambulatory Visit: Payer: Self-pay | Admitting: Physician Assistant

## 2014-09-24 ENCOUNTER — Encounter: Payer: Self-pay | Admitting: Internal Medicine

## 2014-09-24 ENCOUNTER — Ambulatory Visit (INDEPENDENT_AMBULATORY_CARE_PROVIDER_SITE_OTHER): Payer: Medicare Other | Admitting: Internal Medicine

## 2014-09-24 VITALS — BP 126/88 | HR 92 | Temp 97.7°F | Resp 18 | Ht 63.0 in | Wt 160.2 lb

## 2014-09-24 DIAGNOSIS — E782 Mixed hyperlipidemia: Secondary | ICD-10-CM

## 2014-09-24 DIAGNOSIS — Z23 Encounter for immunization: Secondary | ICD-10-CM

## 2014-09-24 DIAGNOSIS — Z1212 Encounter for screening for malignant neoplasm of rectum: Secondary | ICD-10-CM

## 2014-09-24 DIAGNOSIS — I10 Essential (primary) hypertension: Secondary | ICD-10-CM

## 2014-09-24 DIAGNOSIS — M797 Fibromyalgia: Secondary | ICD-10-CM

## 2014-09-24 DIAGNOSIS — Z0001 Encounter for general adult medical examination with abnormal findings: Secondary | ICD-10-CM

## 2014-09-24 DIAGNOSIS — R7303 Prediabetes: Secondary | ICD-10-CM

## 2014-09-24 DIAGNOSIS — E559 Vitamin D deficiency, unspecified: Secondary | ICD-10-CM

## 2014-09-24 DIAGNOSIS — M51369 Other intervertebral disc degeneration, lumbar region without mention of lumbar back pain or lower extremity pain: Secondary | ICD-10-CM

## 2014-09-24 DIAGNOSIS — R6889 Other general symptoms and signs: Secondary | ICD-10-CM

## 2014-09-24 DIAGNOSIS — M5136 Other intervertebral disc degeneration, lumbar region: Secondary | ICD-10-CM

## 2014-09-24 DIAGNOSIS — Z79899 Other long term (current) drug therapy: Secondary | ICD-10-CM | POA: Insufficient documentation

## 2014-09-24 LAB — CBC WITH DIFFERENTIAL/PLATELET
BASOS PCT: 0 % (ref 0–1)
Basophils Absolute: 0 10*3/uL (ref 0.0–0.1)
Eosinophils Absolute: 0.1 10*3/uL (ref 0.0–0.7)
Eosinophils Relative: 1 % (ref 0–5)
HCT: 41 % (ref 36.0–46.0)
Hemoglobin: 13.7 g/dL (ref 12.0–15.0)
Lymphocytes Relative: 36 % (ref 12–46)
Lymphs Abs: 3.2 10*3/uL (ref 0.7–4.0)
MCH: 26.8 pg (ref 26.0–34.0)
MCHC: 33.4 g/dL (ref 30.0–36.0)
MCV: 80.2 fL (ref 78.0–100.0)
Monocytes Absolute: 0.7 10*3/uL (ref 0.1–1.0)
Monocytes Relative: 8 % (ref 3–12)
NEUTROS ABS: 5 10*3/uL (ref 1.7–7.7)
NEUTROS PCT: 55 % (ref 43–77)
Platelets: 333 10*3/uL (ref 150–400)
RBC: 5.11 MIL/uL (ref 3.87–5.11)
RDW: 14 % (ref 11.5–15.5)
WBC: 9 10*3/uL (ref 4.0–10.5)

## 2014-09-24 NOTE — Patient Instructions (Signed)
Recommend the book "The END of DIETING" by Dr Baker Janus   and the book "The END of DIABETES " by Dr Excell Seltzer  At [email or web address] - get book & Audio CD's      Being diabetic has a  300% increased risk for heart attack, stroke, cancer, and alzheimer- type vascular dementia. It is very important that you work harder with diet by avoiding all foods that are white except chicken & fish. Avoid white rice (brown & wild rice is OK), white potatoes (sweetpotatoes in moderation is OK), White bread or wheat bread or anything made out of white flour like bagels, donuts, rolls, buns, biscuits, cakes, pastries, cookies, pizza crust, and pasta (made from white flour & egg whites) - vegetarian pasta or spinach or wheat pasta is OK. Multigrain breads like Arnold's or [employer], or multigrain sandwich thins or flatbreads.  Diet, exercise and weight loss can reverse and cure diabetes in the early stages.  Diet, exercise and weight loss is very important in the control and prevention of complications of diabetes which affects every system in your body, ie. Brain - dementia/stroke, eyes - glaucoma/blindness, heart - heart attack/heart failure, kidneys - dialysis, stomach - gastric paralysis, intestines - malabsorption, nerves - severe painful neuritis, circulation - gangrene & loss of a leg(s), and finally cancer and Alzheimers.    I recommend avoid fried & greasy foods,  sweets/candy, white rice (brown or wild rice or Quinoa is OK), white potatoes (sweet potatoes are OK) - anything made from white flour - bagels, doughnuts, rolls, buns, biscuits,white and wheat breads, pizza crust and traditional pasta made of white flour & egg white(vegetarian pasta or spinach or wheat pasta is OK).  Multi-grain bread is OK - like multi-grain flat bread or sandwich thins. Avoid alcohol in excess. Exercise is also important.    Eat all the vegetables you want - avoid meat, especially red meat and dairy - especially cheese.  Cheese  is the most concentrated form of trans-fats which is the worst thing to clog up our arteries. Veggie cheese is OK which can be found in the fresh produce section at [employer] or [employer] or [employer]  Preventive Care for Adults A healthy lifestyle and preventive care can promote health and wellness. Preventive health guidelines for women include the following key practices.  A routine yearly physical is a good way to check with your health care provider about your health and preventive screening. It is a chance to share any concerns and updates on your health and to receive a thorough exam.  Visit your dentist for a routine exam and preventive care every 6 months. Brush your teeth twice a day and floss once a day. Good oral hygiene prevents tooth decay and gum disease.  The frequency of eye exams is based on your age, health, family medical history, use of contact lenses, and other factors. Follow your health care provider's recommendations for frequency of eye exams.  Eat a healthy diet. Foods like vegetables, fruits, whole grains, low-fat dairy products, and lean protein foods contain the nutrients you need without too many calories. Decrease your intake of foods high in solid fats, added sugars, and salt. Eat the right amount of calories for you.Get information about a proper diet from your health care provider, if necessary.  Regular physical exercise is one of the most important things you can do for your health. Most adults should get at least 150 minutes of moderate-intensity exercise (any activity that increases  your heart rate and causes you to sweat) each week. In addition, most adults need muscle-strengthening exercises on 2 or more days a week.  Maintain a healthy weight. The body mass index (BMI) is a screening tool to identify possible weight problems. It provides an estimate of body fat based on height and weight. Your health care provider can find your BMI and can help you  achieve or maintain a healthy weight.For adults 20 years and older:    A BMI of 18.5 to 24.9 is normal.  A BMI of 25 to 29.9 is considered overweight.  A BMI of 30 and above is considered obese.  Maintain normal blood lipids and cholesterol levels by exercising and minimizing your intake of saturated fat. Eat a balanced diet with plenty of fruit and vegetables. Blood tests for lipids and cholesterol should begin at age 64 and be repeated every 5 years. If your lipid or cholesterol levels are high, you are over 50, or you are at high risk for heart disease, you may need your cholesterol levels checked more frequently.Ongoing high lipid and cholesterol levels should be treated with medicines if diet and exercise are not working.  If you smoke, find out from your health care provider how to quit. If you do not use tobacco, do not start.  Lung cancer screening is recommended for adults aged 11-80 years who are at high risk for developing lung cancer because of a history of smoking. A yearly low-dose CT scan of the lungs is recommended for people who have at least a 30-pack-year history of smoking and are a current smoker or have quit within the past 15 years. A pack year of smoking is smoking an average of 1 pack of cigarettes a day for 1 year (for example: 1 pack a day for 30 years or 2 packs a day for 15 years). Yearly screening should continue until the smoker has stopped smoking for at least 15 years. Yearly screening should be stopped for people who develop a health problem that would prevent them from having lung cancer treatment.  High blood pressure causes heart disease and increases the risk of stroke. Your blood pressure should be checked at least every 1 to 2 years. Ongoing high blood pressure should be treated with medicines if weight loss and exercise do not work.  If you are 32-51 years old, ask your health care provider if you should take aspirin to prevent strokes.  Diabetes  screening involves taking a blood sample to check your fasting blood sugar level. This should be done once every 3 years, after age 42, if you are within normal weight and without risk factors for diabetes. Testing should be considered at a younger age or be carried out more frequently if you are overweight and have at least 1 risk factor for diabetes.  Breast cancer screening is essential preventive care for women. You should practice "breast self-awareness." This means understanding the normal appearance and feel of your breasts and may include breast self-examination. Any changes detected, no matter how small, should be reported to a health care provider. Women in their 50s and 30s should have a clinical breast exam (CBE) by a health care provider as part of a regular health exam every 1 to 3 years. After age 93, women should have a CBE every year. Starting at age 59, women should consider having a mammogram (breast X-ray test) every year. Women who have a family history of breast cancer should talk to their health  care provider about genetic screening. Women at a high risk of breast cancer should talk to their health care providers about having an MRI and a mammogram every year.  Breast cancer gene (BRCA)-related cancer risk assessment is recommended for women who have family members with BRCA-related cancers. BRCA-related cancers include breast, ovarian, tubal, and peritoneal cancers. Having family members with these cancers may be associated with an increased risk for harmful changes (mutations) in the breast cancer genes BRCA1 and BRCA2. Results of the assessment will determine the need for genetic counseling and BRCA1 and BRCA2 testing.  Routine pelvic exams to screen for cancer are no longer recommended for nonpregnant women who are considered low risk for cancer of the pelvic organs (ovaries, uterus, and vagina) and who do not have symptoms. Ask your health care provider if a screening pelvic exam is  right for you.  If you have had past treatment for cervical cancer or a condition that could lead to cancer, you need Pap tests and screening for cancer for at least 20 years after your treatment. If Pap tests have been discontinued, your risk factors (such as having a new sexual partner) need to be reassessed to determine if screening should be resumed. Some women have medical problems that increase the chance of getting cervical cancer. In these cases, your health care provider may recommend more frequent screening and Pap tests.  Colorectal cancer can be detected and often prevented. Most routine colorectal cancer screening begins at the age of 60 years and continues through age 13 years. However, your health care provider may recommend screening at an earlier age if you have risk factors for colon cancer. On a yearly basis, your health care provider may provide home test kits to check for hidden blood in the stool. Use of a small camera at the end of a tube, to directly examine the colon (sigmoidoscopy or colonoscopy), can detect the earliest forms of colorectal cancer. Talk to your health care provider about this at age 36, when routine screening begins. Direct exam of the colon should be repeated every 5-10 years through age 77 years, unless early forms of pre-cancerous polyps or small growths are found.  Hepatitis C blood testing is recommended for all people born from [birth year] through 1965 and any individual with known risks for hepatitis C.  Pra  Osteoporosis is a disease in which the bones lose minerals and strength with aging. This can result in serious bone fractures or breaks. The risk of osteoporosis can be identified using a bone density scan. Women ages 63 years and over and women at risk for fractures or osteoporosis should discuss screening with their health care providers. Ask your health care provider whether you should take a calcium supplement or vitamin D to reduce the rate of  osteoporosis.  Menopause can be associated with physical symptoms and risks. Hormone replacement therapy is available to decrease symptoms and risks. You should talk to your health care provider about whether hormone replacement therapy is right for you.  Use sunscreen. Apply sunscreen liberally and repeatedly throughout the day. You should seek shade when your shadow is shorter than you. Protect yourself by wearing long sleeves, pants, a wide-brimmed hat, and sunglasses year round, whenever you are outdoors.  Once a month, do a whole body skin exam, using a mirror to look at the skin on your back. Tell your health care provider of new moles, moles that have irregular borders, moles that are larger than a pencil eraser, or  moles that have changed in shape or color.  Stay current with required vaccines (immunizations).  Influenza vaccine. All adults should be immunized every year.  Tetanus, diphtheria, and acellular pertussis (Td, Tdap) vaccine. Pregnant women should receive 1 dose of Tdap vaccine during each pregnancy. The dose should be obtained regardless of the length of time since the last dose. Immunization is preferred during the 27th-36th week of gestation. An adult who has not previously received Tdap or who does not know her vaccine status should receive 1 dose of Tdap. This initial dose should be followed by tetanus and diphtheria toxoids (Td) booster doses every 10 years. Adults with an unknown or incomplete history of completing a 3-dose immunization series with Td-containing vaccines should begin or complete a primary immunization series including a Tdap dose. Adults should receive a Td booster every 10 years.  Varicella vaccine. An adult without evidence of immunity to varicella should receive 2 doses or a second dose if she has previously received 1 dose. Pregnant females who do not have evidence of immunity should receive the first dose after pregnancy. This first dose should be  obtained before leaving the health care facility. The second dose should be obtained 4-8 weeks after the first dose.  Human papillomavirus (HPV) vaccine. Females aged 13-26 years who have not received the vaccine previously should obtain the 3-dose series. The vaccine is not recommended for use in pregnant females. However, pregnancy testing is not needed before receiving a dose. If a female is found to be pregnant after receiving a dose, no treatment is needed. In that case, the remaining doses should be delayed until after the pregnancy. Immunization is recommended for any person with an immunocompromised condition through the age of 70 years if she did not get any or all doses earlier. During the 3-dose series, the second dose should be obtained 4-8 weeks after the first dose. The third dose should be obtained 24 weeks after the first dose and 16 weeks after the second dose.  Zoster vaccine. One dose is recommended for adults aged 95 years or older unless certain conditions are present.  Measles, mumps, and rubella (MMR) vaccine. Adults born before [birth year] generally are considered immune to measles and mumps. Adults born in [birth year] or later should have 1 or more doses of MMR vaccine unless there is a contraindication to the vaccine or there is laboratory evidence of immunity to each of the three diseases. A routine second dose of MMR vaccine should be obtained at least 28 days after the first dose for students attending postsecondary schools, health care workers, or international travelers. People who received inactivated measles vaccine or an unknown type of measles vaccine during 1963-1967 should receive 2 doses of MMR vaccine. People who received inactivated mumps vaccine or an unknown type of mumps vaccine before 1979 and are at high risk for mumps infection should consider immunization with 2 doses of MMR vaccine. For females of childbearing age, rubella immunity should be determined. If there is no evidence  of immunity, females who are not pregnant should be vaccinated. If there is no evidence of immunity, females who are pregnant should delay immunization until after pregnancy. Unvaccinated health care workers born before [birth year] who lack laboratory evidence of measles, mumps, or rubella immunity or laboratory confirmation of disease should consider measles and mumps immunization with 2 doses of MMR vaccine or rubella immunization with 1 dose of MMR vaccine.  Pneumococcal 13-valent conjugate (PCV13) vaccine. When indicated, a person who is uncertain  of her immunization history and has no record of immunization should receive the PCV13 vaccine. An adult aged 1 years or older who has certain medical conditions and has not been previously immunized should receive 1 dose of PCV13 vaccine. This PCV13 should be followed with a dose of pneumococcal polysaccharide (PPSV23) vaccine. The PPSV23 vaccine dose should be obtained at least 8 weeks after the dose of PCV13 vaccine. An adult aged 34 years or older who has certain medical conditions and previously received 1 or more doses of PPSV23 vaccine should receive 1 dose of PCV13. The PCV13 vaccine dose should be obtained 1 or more years after the last PPSV23 vaccine dose.    Pneumococcal polysaccharide (PPSV23) vaccine. When PCV13 is also indicated, PCV13 should be obtained first. All adults aged 53 years and older should be immunized. An adult younger than age 39 years who has certain medical conditions should be immunized. Any person who resides in a nursing home or long-term care facility should be immunized. An adult smoker should be immunized. People with an immunocompromised condition and certain other conditions should receive both PCV13 and PPSV23 vaccines. People with human immunodeficiency virus (HIV) infection should be immunized as soon as possible after diagnosis. Immunization during chemotherapy or radiation therapy should be avoided. Routine use of PPSV23  vaccine is not recommended for American Indians, [address], or people younger than 65 years unless there are medical conditions that require PPSV23 vaccine. When indicated, people who have unknown immunization and have no record of immunization should receive PPSV23 vaccine. One-time revaccination 5 years after the first dose of PPSV23 is recommended for people aged 19-64 years who have chronic kidney failure, nephrotic syndrome, asplenia, or immunocompromised conditions. People who received 1-2 doses of PPSV23 before age 47 years should receive another dose of PPSV23 vaccine at age 2 years or later if at least 5 years have passed since the previous dose. Doses of PPSV23 are not needed for people immunized with PPSV23 at or after age 99 years.  Preventive Services / Frequency   Ages 42 to 64 years  Blood pressure check.  Lipid and cholesterol check.  Lung cancer screening. / Every year if you are aged 55-80 years and have a 30-pack-year history of smoking and currently smoke or have quit within the past 15 years. Yearly screening is stopped once you have quit smoking for at least 15 years or develop a health problem that would prevent you from having lung cancer treatment.  Clinical breast exam.** / Every year after age 22 years.  BRCA-related cancer risk assessment.** / For women who have family members with a BRCA-related cancer (breast, ovarian, tubal, or peritoneal cancers).  Mammogram.** / Every year beginning at age 55 years and continuing for as long as you are in good health. Consult with your health care provider.  Pap test.** / Every 3 years starting at age 48 years through age 68 or 54 years with a history of 3 consecutive normal Pap tests.  HPV screening.** / Every 3 years from ages 49 years through ages 69 to 23 years with a history of 3 consecutive normal Pap tests.  Fecal occult blood test (FOBT) of stool. / Every year beginning at age 3 years and continuing until age 32  years. You may not need to do this test if you get a colonoscopy every 10 years.  Flexible sigmoidoscopy or colonoscopy.** / Every 5 years for a flexible sigmoidoscopy or every 10 years for a colonoscopy beginning at age  29 years and continuing until age 59 years.  Hepatitis C blood test.** / For all people born from [birth year] through 1963 and any individual with known risks for hepatitis C.  Skin self-exam. / Monthly.  Influenza vaccine. / Every year.  Tetanus, diphtheria, and acellular pertussis (Tdap/Td) vaccine.** / Consult your health care provider. Pregnant women should receive 1 dose of Tdap vaccine during each pregnancy. 1 dose of Td every 10 years.  Varicella vaccine.** / Consult your health care provider. Pregnant females who do not have evidence of immunity should receive the first dose after pregnancy.  Zoster vaccine.** / 1 dose for adults aged 36 years or older.  Pneumococcal 13-valent conjugate (PCV13) vaccine.** / Consult your health care provider.  Pneumococcal polysaccharide (PPSV23) vaccine.** / 1 to 2 doses if you smoke cigarettes or if you have certain conditions.  Meningococcal vaccine.** / Consult your health care provider.  Hepatitis A vaccine.** / Consult your health care provider.  Hepatitis B vaccine.** / Consult your health care provider.

## 2014-09-24 NOTE — Progress Notes (Signed)
Patient ID: Desiree Anderson, female   DOB: 07-25-1956, 58 y.o.   MRN: [id number]  Annual Screening Comprehensive Examination  This very nice 58 y.o.MWF presents for complete physical.  Patient has been followed for HTN, Fibromyalgia, Chronic Pain syndrome, Prediabetes, Hyperlipidemia, and Vitamin D Deficiency.     Patient is anticipating surgery in the near future by Dr Gaynelle Arabian for attempt at repair of a failed bladder suspension.    HTN predates since 2004. Patient's BP has been controlled at home and patient denies any cardiac symptoms as chest pain, palpitations, shortness of breath, dizziness or ankle swelling. Today's BP: 126/88 mmHg    Patient's hyperlipidemia is controlled with diet and medications. Patient denies myalgias or other medication SE's. Last lipids were at goal - Total Chol 155; HDL  54; LDL  75; Trig 130 on 05/28/2014.   Patient has prediabetes predating since Jan 2013 with A1c 5.7% and patient denies reactive hypoglycemic symptoms, visual blurring, diabetic polys, or paresthesias. Last A1c was  5.5% on 05/28/2014.   Finally, patient has history of Vitamin D Deficiency and last Vitamin D was 95 on 12/10/2013.  Medication Sig  . B Complex-C (SUPER B COMPLEX PO) Take 1 tablet by mouth daily.   . cetirizine (ZYRTEC) 10 MG tablet Take 10 mg by mouth daily as needed for allergies.  . Cholecalciferol (VITAMIN D-3) 5000 UNITS TABS Take 1 tablet by mouth daily.  . CRESTOR 20 MG tablet TAKE ONE TABLET BY MOUTH ONCE DAILY FOR CHOLESTEROL.  . fluticasone (FLONASE) 50 MCG/ACT nasal spray Place 2 sprays into both nostrils daily at 12 noon.  Marland Kitchen guaiFENesin (MUCINEX) 600 MG 12 hr tablet Take 600 mg by mouth 2 (two) times daily as needed.  . hydrochlorothiazide (HYDRODIURIL) 25 MG tablet TAKE ONE TABLET BY MOUTH EVERY DAY  . levothyroxine (SYNTHROID, LEVOTHROID) 88 MCG tablet Take 1 tablet (88 mcg total) by mouth daily before breakfast.  . meloxicam (MOBIC) 7.5 MG tablet Take 7.5 mg by mouth  daily.  . Multiple Vitamins-Minerals (GERITABS PO) Take 1 tablet by mouth daily.  . ondansetron (ZOFRAN) 4 MG tablet Take 1 tablet (4 mg total) by mouth every 8 (eight) hours as needed for nausea (may repeat dose in 1 hr if no improvement).  . ranitidine (ZANTAC) 300 MG tablet TAKE ONE TABLET BY MOUTH TWICE DAILY  . rosuvastatin (CRESTOR) 40 MG tablet Take 1/2 to 1 tablet daily or as directed for cholesterol.  . triazolam (HALCION) 0.25 MG tablet TAKE ONE TABLET BY MOUTH AT BEDTIME   Allergies  Allergen Reactions  . Shellfish Allergy Diarrhea and Nausea And Vomiting   Past Medical History  Diagnosis Date  . Hypertension   . Fibromyalgia   . Hypothyroidism   . GERD (gastroesophageal reflux disease)   . DDD (degenerative disc disease), lumbar   . Anxiety   . Depression   . Erosion of vaginal mesh   . Rectocele   . History of gastric ulcer    Health Maintenance  Topic Date Due  . Mammogram  12/01/2007  . Colonoscopy  12/01/2007  . Influenza Vaccine  06/27/2014  . Pap Smear  02/19/2017  . Tetanus/tdap  03/15/2021   Immunization History  Administered Date(s) Administered  . Influenza Split 09/18/2013  . Pneumococcal-Unspecified 11/27/2000  . Tdap 03/16/2011   Past Surgical History  Procedure Laterality Date  . Incontinence surgery  2009  &  2005  . Bladder tack  2003  . Breast lumpectomy  1985  . Vein bypass surgery  AGE 27    RIGHT LEG  . Transthoracic echocardiogram  06-16-2013    GRADE I DIASTOLIC DYSFUNCTION/  EF 55-60%/  MILD AR/  TRIVIAL MR  &  TR  . Cardiovascular stress test  07-02-2013  DR [facility]    LOW RISK NUCLEAR STUDY WITH A SMALL, MILD APICAL SEPTAL FIXED PERFUSION DEFECT .  GIVEN NORMAL WALL FUNCTION , SUSPECT REPRESENTS ATTENUATION/  EF 74%/  NO ISCHEMIA  . Cysto/  hydrodistention/  bladder bx/   instillation therapy  08-29-2010  . Vaginal hysterectomy  2000    w/  unilateral salpingoophorectomy  . Cataract extraction w/ intraocular lens implant  Left 01/2014  . Excision of mesh N/A 03/23/2014    Procedure: EXCISION OF VAGINAL AND PERI-URETHRAL MESH, EXTRACTION FROM URETHRA TO APEX, insertion of XENFORM in the vaginal vault;  Surgeon: Ailene Rud, MD;  Location: [facility];  Service: Urology;  Laterality: N/A;  . Excision vaginal cyst N/A 04/06/2014    Procedure: VAGINAL SPECULUM EXAMINATION/POSSIBLE DRAINAGE OF HEMATOMA;  Surgeon: Ailene Rud, MD;  Location: [facility];  Service: Urology;  Laterality: N/A;   History  Substance Use Topics  . Smoking status: Former Smoker -- 20 years    Types: Cigarettes    Quit date: 11/28/1995  . Smokeless tobacco: Current User    Types: Snuff     Comment: 4oz can last 14-16days  . Alcohol Use: No    ROS Constitutional: Denies fever, chills, weight loss/gain, headaches, insomnia, fatigue, night sweats, and change in appetite. Eyes: Denies redness, blurred vision, diplopia, discharge, itchy, watery eyes.  ENT: Denies discharge, congestion, post nasal drip, epistaxis, sore throat, earache, hearing loss, dental pain, Tinnitus, Vertigo, Sinus pain, snoring.  Cardio: Denies chest pain, palpitations, irregular heartbeat, syncope, dyspnea, diaphoresis, orthopnea, PND, claudication, edema Respiratory: denies cough, dyspnea, DOE, pleurisy, hoarseness, laryngitis, wheezing.  Gastrointestinal: Denies dysphagia, heartburn, reflux, water brash, pain, cramps, nausea, vomiting, bloating, diarrhea, constipation, hematemesis, melena, hematochezia, jaundice, hemorrhoids Genitourinary: Denies dysuria, frequency, urgency, nocturia, hesitancy, discharge, hematuria, flank pain Breast: Breast lumps, nipple discharge, bleeding.  Musculoskeletal: Denies arthralgia, myalgia, stiffness, Jt. Swelling, pain, limp, and strain/sprain. Denies falls. Skin: Denies puritis, rash, hives, warts, acne, eczema, changing in skin lesion Neuro: No weakness, tremor, incoordination, spasms,  paresthesia, pain Psychiatric: Denies confusion, memory loss, sensory loss. Denies Depression. Endocrine: Denies change in weight, skin, hair change, nocturia, and paresthesia, diabetic polys, visual blurring, hyper / hypo glycemic episodes.  Heme/Lymph: No excessive bleeding, bruising, enlarged lymph nodes.  Physical Exam  BP 126/88  Pulse 92  Temp  97.7 F   Resp 18  Ht 5\' 3"    Wt 160 lb 3.2 oz   BMI 28.39  General Appearance: Well nourished and in no apparent distress.  Eyes: PERRLA, EOMs, conjunctiva no swelling or erythema, normal fundi and vessels. Sinuses: No frontal/maxillary tenderness ENT/Mouth: EACs patent / TMs  nl. Nares clear without erythema, swelling, mucoid exudates. Oral hygiene is good. No erythema, swelling, or exudate. Tongue normal, non-obstructing. Tonsils not swollen or erythematous. Hearing normal.  Neck: Supple, thyroid normal. No bruits, nodes or JVD. Respiratory: Respiratory effort normal.  BS equal and clear bilateral without rales, rhonci, wheezing or stridor. Cardio: Heart sounds are normal with regular rate and rhythm and no murmurs, rubs or gallops. Peripheral pulses are normal and equal bilaterally without edema. No aortic or femoral bruits. Chest: symmetric with normal excursions and percussion. Breasts: Symmetric, without lumps, nipple discharge, retractions, or fibrocystic changes.  Abdomen:  Flat, soft, with bowl sounds. Nontender, no guarding, rebound, hernias, masses, or organomegaly.  Lymphatics: Non tender without lymphadenopathy.  Musculoskeletal: Full ROM all peripheral extremities, joint stability, 5/5 strength, and normal gait. Skin: Warm and dry without rashes, lesions, cyanosis, clubbing or  ecchymosis.  Neuro: Cranial nerves intact, reflexes equal bilaterally. Normal muscle tone, no cerebellar symptoms. Sensation intact.  Pysch: Awake and oriented X 3, normal affect, Insight and Judgment appropriate.   Assessment and Plan  1. Annual  Screening Examination 2. Hypertension  3. Hyperlipidemia 4. Pre Diabetes 5. Vitamin D Deficiency 6. Fibromyalgia 7. Pelvic pain related to failed bladder suspension 8. DJD / DDD   Continue prudent diet as discussed, weight control, BP monitoring, regular exercise, and medications. Discussed med's effects and SE's. Screening labs and tests as requested with regular follow-up as recommended.

## 2014-09-25 DIAGNOSIS — Z23 Encounter for immunization: Secondary | ICD-10-CM

## 2014-09-25 LAB — LIPID PANEL
CHOL/HDL RATIO: 2.8 ratio
CHOLESTEROL: 160 mg/dL (ref 0–200)
HDL: 57 mg/dL (ref >39)
LDL Cholesterol: 71 mg/dL (ref 0–99)
TRIGLYCERIDES: 160 mg/dL — AB (ref <150)
VLDL: 32 mg/dL (ref 0–40)

## 2014-09-25 LAB — BASIC METABOLIC PANEL WITH GFR
BUN: 8 mg/dL (ref 6–23)
CO2: 30 mEq/L (ref 19–32)
Calcium: 9.2 mg/dL (ref 8.4–10.5)
Chloride: 97 mEq/L (ref 96–112)
Creat: 0.67 mg/dL (ref 0.50–1.10)
GFR, Est African American: >89 mL/min
GLUCOSE: 86 mg/dL (ref 70–99)
POTASSIUM: 3.9 meq/L (ref 3.5–5.3)
SODIUM: 137 meq/L (ref 135–145)

## 2014-09-25 LAB — HEPATIC FUNCTION PANEL
ALT: 33 U/L (ref 0–35)
AST: 30 U/L (ref 0–37)
Albumin: 4.5 g/dL (ref 3.5–5.2)
Alkaline Phosphatase: 65 U/L (ref 39–117)
BILIRUBIN DIRECT: 0.1 mg/dL (ref 0.0–0.3)
BILIRUBIN INDIRECT: 0.3 mg/dL (ref 0.2–1.2)
Total Bilirubin: 0.4 mg/dL (ref 0.2–1.2)
Total Protein: 6.7 g/dL (ref 6.0–8.3)

## 2014-09-25 LAB — MAGNESIUM: Magnesium: 2.1 mg/dL (ref 1.5–2.5)

## 2014-09-25 LAB — VITAMIN D 25 HYDROXY (VIT D DEFICIENCY, FRACTURES): Vit D, 25-Hydroxy: 83 ng/mL (ref 30–89)

## 2014-09-25 LAB — HEMOGLOBIN A1C
Hgb A1c MFr Bld: 5.6 % (ref <5.7)
Mean Plasma Glucose: 114 mg/dL (ref <117)

## 2014-09-25 LAB — INSULIN, FASTING: Insulin fasting, serum: 13.3 u[IU]/mL (ref 2.0–19.6)

## 2014-09-25 LAB — TSH: TSH: 0.083 u[IU]/mL — ABNORMAL LOW (ref 0.350–4.500)

## 2014-09-25 NOTE — Addendum Note (Signed)
Addended by: Melbourne Abts C on: 09/25/2014 08:30 AM   Modules accepted: Orders

## 2014-09-28 ENCOUNTER — Telehealth: Payer: Self-pay

## 2014-09-28 NOTE — Telephone Encounter (Signed)
-----   Message from Unk Pinto, MD sent at 09/27/2014  7:02 PM EST ----- - Chol 160 - Excellent  - Thyroid sl high - suggest cut down to 6 tabs/week  ie take 1/2 tab 2 x week on Mon & Thurs and 1 whole tab  The other 5 days /wk - Vit D 83 - excellent  - All else Nl/OK - Need NV in 4 weeks to recheck TSH

## 2014-09-28 NOTE — Telephone Encounter (Signed)
Left voicemail for patient to return call for lab results.

## 2014-10-08 ENCOUNTER — Other Ambulatory Visit: Payer: Self-pay | Admitting: Physician Assistant

## 2014-10-08 ENCOUNTER — Other Ambulatory Visit: Payer: Self-pay | Admitting: Internal Medicine

## 2014-10-27 ENCOUNTER — Ambulatory Visit (INDEPENDENT_AMBULATORY_CARE_PROVIDER_SITE_OTHER): Payer: Medicare Other | Admitting: *Deleted

## 2014-10-27 ENCOUNTER — Other Ambulatory Visit: Payer: Self-pay | Admitting: Physician Assistant

## 2014-10-27 DIAGNOSIS — R7989 Other specified abnormal findings of blood chemistry: Secondary | ICD-10-CM

## 2014-10-27 NOTE — Progress Notes (Signed)
Patient ID: Desiree Anderson, female   DOB: 01-Jan-1956, 58 y.o.   MRN: [id number] Patient presents for 4 week recheck TSH level.  Patient states she currently is taking Levothyroxine 88 mcg 1 whole pill times 5 days and 1/2 pill Mondays and Fridays.

## 2014-10-28 LAB — TSH: TSH: 0.164 u[IU]/mL — ABNORMAL LOW (ref 0.350–4.500)

## 2014-10-28 NOTE — Telephone Encounter (Signed)
Called Rx into [facility] 

## 2014-11-09 ENCOUNTER — Other Ambulatory Visit: Payer: Self-pay | Admitting: *Deleted

## 2014-11-09 MED ORDER — HYDROCODONE-ACETAMINOPHEN 5-325 MG PO TABS: ORAL_TABLET | ORAL | Status: DC

## 2014-12-09 ENCOUNTER — Ambulatory Visit (INDEPENDENT_AMBULATORY_CARE_PROVIDER_SITE_OTHER): Payer: Medicare Other

## 2014-12-09 DIAGNOSIS — E039 Hypothyroidism, unspecified: Secondary | ICD-10-CM

## 2014-12-09 DIAGNOSIS — R6889 Other general symptoms and signs: Secondary | ICD-10-CM

## 2014-12-09 LAB — TSH: TSH: 0.098 u[IU]/mL — AB (ref 0.350–4.500)

## 2014-12-09 NOTE — Progress Notes (Signed)
Patient ID: Desiree Anderson, female   DOB: [birth year], 59 y.o.   MRN: [id number] Patient here today to recheck TSH. Takes Levothyroxine 88 mcg, one tablet M,W,F and 1/2 tab T, Thurs, Sat and Sunday

## 2014-12-20 ENCOUNTER — Other Ambulatory Visit: Payer: Self-pay | Admitting: Internal Medicine

## 2014-12-28 ENCOUNTER — Other Ambulatory Visit: Payer: Self-pay | Admitting: Physician Assistant

## 2014-12-28 DIAGNOSIS — G4700 Insomnia, unspecified: Secondary | ICD-10-CM

## 2015-01-12 ENCOUNTER — Encounter: Payer: Self-pay | Admitting: Physician Assistant

## 2015-01-12 ENCOUNTER — Ambulatory Visit (INDEPENDENT_AMBULATORY_CARE_PROVIDER_SITE_OTHER): Payer: Medicare Other | Admitting: Physician Assistant

## 2015-01-12 VITALS — BP 120/78 | HR 100 | Temp 97.7°F | Resp 16 | Ht 63.0 in | Wt 165.0 lb

## 2015-01-12 DIAGNOSIS — Z79899 Other long term (current) drug therapy: Secondary | ICD-10-CM

## 2015-01-12 DIAGNOSIS — E559 Vitamin D deficiency, unspecified: Secondary | ICD-10-CM

## 2015-01-12 DIAGNOSIS — I10 Essential (primary) hypertension: Secondary | ICD-10-CM

## 2015-01-12 DIAGNOSIS — R7303 Prediabetes: Secondary | ICD-10-CM

## 2015-01-12 DIAGNOSIS — E039 Hypothyroidism, unspecified: Secondary | ICD-10-CM

## 2015-01-12 DIAGNOSIS — R7309 Other abnormal glucose: Secondary | ICD-10-CM

## 2015-01-12 DIAGNOSIS — E782 Mixed hyperlipidemia: Secondary | ICD-10-CM

## 2015-01-12 LAB — CBC WITH DIFFERENTIAL/PLATELET
BASOS ABS: 0 10*3/uL (ref 0.0–0.1)
Basophils Relative: 0 % (ref 0–1)
EOS ABS: 0.2 10*3/uL (ref 0.0–0.7)
EOS PCT: 2 % (ref 0–5)
HCT: 44.7 % (ref 36.0–46.0)
HEMOGLOBIN: 14.8 g/dL (ref 12.0–15.0)
LYMPHS PCT: 47 % — AB (ref 12–46)
Lymphs Abs: 4.1 10*3/uL — ABNORMAL HIGH (ref 0.7–4.0)
MCH: 26.9 pg (ref 26.0–34.0)
MCHC: 33.1 g/dL (ref 30.0–36.0)
MCV: 81.1 fL (ref 78.0–100.0)
MONO ABS: 0.6 10*3/uL (ref 0.1–1.0)
MPV: 9.7 fL (ref 8.6–12.4)
Monocytes Relative: 7 % (ref 3–12)
Neutro Abs: 3.9 10*3/uL (ref 1.7–7.7)
Neutrophils Relative %: 44 % (ref 43–77)
Platelets: 333 10*3/uL (ref 150–400)
RBC: 5.51 MIL/uL — ABNORMAL HIGH (ref 3.87–5.11)
RDW: 14.5 % (ref 11.5–15.5)
WBC: 8.8 10*3/uL (ref 4.0–10.5)

## 2015-01-12 LAB — TSH: TSH: 13.967 u[IU]/mL — ABNORMAL HIGH (ref 0.350–4.500)

## 2015-01-12 LAB — LIPID PANEL
CHOL/HDL RATIO: 2.8 ratio
CHOLESTEROL: 168 mg/dL (ref 0–200)
HDL: 61 mg/dL (ref >39)
LDL Cholesterol: 77 mg/dL (ref 0–99)
Triglycerides: 151 mg/dL — ABNORMAL HIGH (ref <150)
VLDL: 30 mg/dL (ref 0–40)

## 2015-01-12 LAB — BASIC METABOLIC PANEL WITH GFR
BUN: 11 mg/dL (ref 6–23)
CALCIUM: 9.2 mg/dL (ref 8.4–10.5)
CHLORIDE: 99 meq/L (ref 96–112)
CO2: 33 mEq/L — ABNORMAL HIGH (ref 19–32)
Creat: 0.72 mg/dL (ref 0.50–1.10)
GFR, Est African American: >89 mL/min
Glucose, Bld: 98 mg/dL (ref 70–99)
POTASSIUM: 3.8 meq/L (ref 3.5–5.3)
Sodium: 140 mEq/L (ref 135–145)

## 2015-01-12 LAB — HEPATIC FUNCTION PANEL
ALBUMIN: 4 g/dL (ref 3.5–5.2)
ALK PHOS: 72 U/L (ref 39–117)
ALT: 28 U/L (ref 0–35)
AST: 24 U/L (ref 0–37)
Bilirubin, Direct: 0.1 mg/dL (ref 0.0–0.3)
Indirect Bilirubin: 0.3 mg/dL (ref 0.2–1.2)
Total Bilirubin: 0.4 mg/dL (ref 0.2–1.2)
Total Protein: 6.7 g/dL (ref 6.0–8.3)

## 2015-01-12 LAB — HEMOGLOBIN A1C
HEMOGLOBIN A1C: 5.8 % — AB (ref <5.7)
Mean Plasma Glucose: 120 mg/dL — ABNORMAL HIGH (ref <117)

## 2015-01-12 LAB — MAGNESIUM: Magnesium: 2 mg/dL (ref 1.5–2.5)

## 2015-01-12 MED ORDER — AZITHROMYCIN 250 MG PO TABS: ORAL_TABLET | ORAL | Status: DC

## 2015-01-12 NOTE — Progress Notes (Signed)
Assessment and Plan:  Hypertension: Continue medication, monitor blood pressure at home. Continue DASH diet.  Reminder to go to the ER if any CP, SOB, nausea, dizziness, severe HA, changes vision/speech, left arm numbness and tingling, and jaw pain. Cholesterol: Continue diet and exercise. Check cholesterol.  Pre-diabetes-Continue diet and exercise. Check A1C Vitamin D Def- check level and continue medications.  Hypothyroidism-check TSH level, continue medications the same, reminded to take on an empty stomach 30-22mins before food.  Obesity with co morbidities- long discussion about weight loss, diet, and exercise Allergic rhinitis- Allegra OTC, increase H20, allergy hygiene explained, if not better than will send in ABX   Continue diet and meds as discussed. Further disposition pending results of labs.  HPI 59 y.o. female  presents for 3 month follow up with hypertension, hyperlipidemia, prediabetes and vitamin D.  Her blood pressure has been controlled at home, today their BP is BP: 120/78 mmHg  She does not workout. She denies chest pain, shortness of breath, dizziness.  She is on cholesterol medication and denies myalgias. Her cholesterol is at goal. The cholesterol last visit was:   Lab Results  Component Value Date   CHOL 160 09/24/2014   HDL 57 09/24/2014   LDLCALC 71 09/24/2014   TRIG 160* 09/24/2014   CHOLHDL 2.8 09/24/2014    She has been working on diet and exercise for prediabetes, and denies paresthesia of the feet, polydipsia, polyuria and visual disturbances. Last A1C in the office was:  Lab Results  Component Value Date   HGBA1C 5.6 09/24/2014   Patient is on Vitamin D supplement.   Lab Results  Component Value Date   VD25OH 1 09/24/2014     She is on thyroid medication. Her medication was changed last visit, she is on thyroxine 70mcg 1/2 every day.   Lab Results  Component Value Date   TSH 0.098* 12/09/2014  .  Sunday she got home from church with sore  throat, had AB pain and diarrhea on Sunday. The diarrhea/AB pain has improved however she now has yellow nasal drainage and nonproductive cough. She is on zyrtec and increased vitamin D.   Had bladder sling repair on May 11 with Dr. Gaynelle Arabian.   BMI is Body mass index is 29.24 kg/(m^2)., she is working on diet and exercise. Wt Readings from Last 3 Encounters:  01/12/15 165 lb (74.844 kg)  09/24/14 160 lb 3.2 oz (72.666 kg)  05/28/14 158 lb 12.8 oz (72.031 kg)    Current Medications:  Current Outpatient Prescriptions on File Prior to Visit  Medication Sig Dispense Refill  . ALPRAZolam (XANAX) 0.5 MG tablet TAKE ONE TABLET BY MOUTH THREE TIMES DAILY AS NEEDED 90 tablet 2  . B Complex-C (SUPER B COMPLEX PO) Take 1 tablet by mouth daily.     . cetirizine (ZYRTEC) 10 MG tablet Take 10 mg by mouth daily as needed for allergies.    . Cholecalciferol (VITAMIN D-3) 5000 UNITS TABS Take 1 tablet by mouth daily.    . CRESTOR 20 MG tablet TAKE ONE TABLET BY MOUTH ONCE DAILY FOR CHOLESTEROL. 30 tablet 0  . Estradiol (VAGIFEM) 10 MCG TABS vaginal tablet Place vaginally.    . fluticasone (FLONASE) 50 MCG/ACT nasal spray Place 2 sprays into both nostrils daily at 12 noon.    Marland Kitchen guaiFENesin (MUCINEX) 600 MG 12 hr tablet Take 600 mg by mouth 2 (two) times daily as needed.    . hydrochlorothiazide (HYDRODIURIL) 25 MG tablet TAKE ONE TABLET BY MOUTH ONCE  DAILY 90 tablet 0  . HYDROcodone-acetaminophen (NORCO/VICODIN) 5-325 MG per tablet Take 1/2 to 1 tablet every 4 hours as needed for severe pain. 50 tablet 0  . KLOR-CON M20 20 MEQ tablet TAKE ONE TABLET BY MOUTH TWICE DAILY 60 tablet 0  . levothyroxine (SYNTHROID, LEVOTHROID) 88 MCG tablet Take 1 tablet (88 mcg total) by mouth daily before breakfast. 90 tablet 3  . meloxicam (MOBIC) 7.5 MG tablet Take 7.5 mg by mouth daily.    . Multiple Vitamins-Minerals (GERITABS PO) Take 1 tablet by mouth daily.    . ondansetron (ZOFRAN) 4 MG tablet Take 1 tablet (4 mg  total) by mouth every 8 (eight) hours as needed for nausea (may repeat dose in 1 hr if no improvement). 20 tablet 0  . pentosan polysulfate (ELMIRON) 100 MG capsule Take by mouth.    . ranitidine (ZANTAC) 300 MG tablet TAKE ONE TABLET BY MOUTH TWICE DAILY 180 tablet 1  . rosuvastatin (CRESTOR) 40 MG tablet Take 1/2 to 1 tablet daily or as directed for cholesterol. 30 tablet 99  . triazolam (HALCION) 0.25 MG tablet TAKE ONE TABLET BY MOUTH AT BEDTIME 30 tablet 0   No current facility-administered medications on file prior to visit.   Medical History:  Past Medical History  Diagnosis Date  . Hypertension   . Fibromyalgia   . Hypothyroidism   . GERD (gastroesophageal reflux disease)   . DDD (degenerative disc disease), lumbar   . Anxiety   . Depression   . Erosion of vaginal mesh   . Rectocele   . History of gastric ulcer    Allergies:  Allergies  Allergen Reactions  . Shellfish Allergy Diarrhea and Nausea And Vomiting    Review of Systems:  Review of Systems  Constitutional: Negative.   HENT: Positive for congestion, ear pain (right) and sore throat. Negative for ear discharge, hearing loss, nosebleeds and tinnitus.   Respiratory: Positive for cough. Negative for hemoptysis, sputum production, shortness of breath, wheezing and stridor.   Cardiovascular: Negative.   Gastrointestinal: Positive for nausea, abdominal pain and diarrhea. Negative for heartburn, vomiting, constipation, blood in stool and melena.  Genitourinary: Negative.   Musculoskeletal: Negative.   Skin: Negative.   Neurological: Negative.  Negative for headaches.  Psychiatric/Behavioral: Negative.     Family history- Review and unchanged Social history- Review and unchanged Physical Exam: BP 120/78 mmHg  Pulse 100  Temp(Src) 97.7 F (36.5 C)  Resp 16  Ht 5\' 3"  (1.6 m)  Wt 165 lb (74.844 kg)  BMI 29.24 kg/m2 Wt Readings from Last 3 Encounters:  01/12/15 165 lb (74.844 kg)  09/24/14 160 lb 3.2 oz  (72.666 kg)  05/28/14 158 lb 12.8 oz (72.031 kg)   General Appearance: Well nourished, in no apparent distress. Eyes: PERRLA, EOMs, conjunctiva no swelling or erythema Sinuses: No Frontal/maxillary tenderness ENT/Mouth: Ext aud canals clear, TMs without erythema, bulging. No erythema, swelling, or exudate on post pharynx.  Tonsils not swollen or erythematous. Hearing normal. + TMJ Neck: Supple, thyroid normal.  Respiratory: Respiratory effort normal, BS equal bilaterally without rales, rhonchi, wheezing or stridor.  Cardio: RRR with no MRGs. Brisk peripheral pulses without edema.  Abdomen: Soft, + BS,  Non tender, no guarding, rebound, hernias, masses. Lymphatics: Non tender without lymphadenopathy.  Musculoskeletal: Full ROM, 5/5 strength, Normal gait Skin: Warm, dry without rashes, lesions, ecchymosis.  Neuro: Cranial nerves intact. Normal muscle tone, no cerebellar symptoms. Psych: Awake and oriented X 3, normal affect, Insight and Judgment appropriate.  Vicie Mutters, PA-C 1:37 PM [facility]

## 2015-01-12 NOTE — Patient Instructions (Signed)
Sinusitis can be uncomfortable. People with sinusitis have congestion with yellow/green/gray discharge, sinus pain/pressure, pain around the eyes. Sinus infections almost ALWAYS stem from a viral infection and antibiotics don't work against a virus. Even when bacteria is responsible, the infections usually clear up on their own in a week or so.   PLEASE TRY TO DO OVER THE COUNTER TREATMENT AND PREDNISONE FOR 5-7 DAYS AND IF YOU ARE NOT GETTING BETTER OR GETTING WORSE THEN YOU CAN START ON AN ANTIBIOTIC GIVEN.  Can take the prednisone AT NIGHT WITH DINNER, it take 8-12 hours to start working so it will NOT affect your sleeping if you take it at night with your food!! Take two pills the first night and 1 or two pill the second night and then 1 pill the other nights.   Risk of antibiotic use: About 1 in 4 people who take antibiotics have side effects including stomach problems, dizziness, or rashes. Those problems clear up soon after stopping the drugs, but in rare cases antibiotics can cause severe allergic reaction. Over use of antibiotics also encourages the growth of bacteria that can't be controlled easily with drugs. That makes you more vunerable to antibiotic-resistant infections and undermines the benefits of antibiotics for others.   Waste of Money: Antibiotics often aren't very expensive, but any money spent on unnecessary drugs is money down the drain.   When are antibiotics needed? Only when symptoms last longer than a week.  Start to improve but then worsen again  -It can take up to 2 weeks to feel better.   -If you do not get better in 7-10 days (Have fever, facial pain, dental pain and swelling), then please call the office and it is now appropriate to start an antibiotic.   -Please take Tylenol or Ibuprofen for pain. -Acetaminiphen 325mg orally every 4-6 hours for pain.  Max: 10 per day -Ibuprofen 200mg orally every 6-8 hours for pain.  Take with food to avoid ulcers.   Max 10 per  day  Please pick one of the over the counter allergy medications below and take it once daily for allergies.  Claritin or loratadine cheapest but likely the weakest  Zyrtec or certizine at night because it can make you sleepy The strongest is allegra or fexafinadine  Cheapest at [facility], [employer], [employer]  -While drinking fluids, pinch and hold nose close and swallow.  This will help open up your eustachian tubes to drain the fluid behind your ear drums. -Try steam showers to open your nasal passages.   Drink lots of water to stay hydrated and to thin mucous.  Flonase/Nasonex is to help the inflammation.  Take 2 sprays in each nostril at bedtime.  Make sure you spray towards the outside of each nostril towards the outer corner of your eye, hold nose close and tilt head back.  This will help the medication get into your sinuses.  If you do not like this medication, then use saline nasal sprays same directions as above for Flonase. Stop the medication right away if you get blurring of your vision or nose bleeds.  Sinusitis Sinusitis is redness, soreness, and inflammation of the paranasal sinuses. Paranasal sinuses are air pockets within the bones of your face (beneath the eyes, the middle of the forehead, or above the eyes). In healthy paranasal sinuses, mucus is able to drain out, and air is able to circulate through them by way of your nose. However, when your paranasal sinuses are inflamed, mucus and air can   become trapped. This can allow bacteria and other germs to grow and cause infection. Sinusitis can develop quickly and last only a short time (acute) or continue over a long period (chronic). Sinusitis that lasts for more than 12 weeks is considered chronic.  CAUSES  Causes of sinusitis include: Allergies. Structural abnormalities, such as displacement of the cartilage that separates your nostrils (deviated septum), which can decrease the air flow through your nose and sinuses and affect sinus  drainage. Functional abnormalities, such as when the small hairs (cilia) that line your sinuses and help remove mucus do not work properly or are not present. SIGNS AND SYMPTOMS  Symptoms of acute and chronic sinusitis are the same. The primary symptoms are pain and pressure around the affected sinuses. Other symptoms include: Upper toothache. Earache. Headache. Bad breath. Decreased sense of smell and taste. A cough, which worsens when you are lying flat. Fatigue. Fever. Thick drainage from your nose, which often is green and may contain pus (purulent). Swelling and warmth over the affected sinuses. DIAGNOSIS  Your health care provider will perform a physical exam. During the exam, your health care provider may: Look in your nose for signs of abnormal growths in your nostrils (nasal polyps).  Tap over the affected sinus to check for signs of infection. View the inside of your sinuses (endoscopy) using an imaging device that has a light attached (endoscope). If your health care provider suspects that you have chronic sinusitis, one or more of the following tests may be recommended: Allergy tests. Nasal culture. A sample of mucus is taken from your nose, sent to a lab, and screened for bacteria. Nasal cytology. A sample of mucus is taken from your nose and examined by your health care provider to determine if your sinusitis is related to an allergy. TREATMENT  Most cases of acute sinusitis are related to a viral infection and will resolve on their own within 10 days. Sometimes medicines are prescribed to help relieve symptoms (pain medicine, decongestants, nasal steroid sprays, or saline sprays).  However, for sinusitis related to a bacterial infection, your health care provider will prescribe antibiotic medicines. These are medicines that will help kill the bacteria causing the infection.  Rarely, sinusitis is caused by a fungal infection. In theses cases, your health care provider will  prescribe antifungal medicine. For some cases of chronic sinusitis, surgery is needed. Generally, these are cases in which sinusitis recurs more than 3 times per year, despite other treatments. HOME CARE INSTRUCTIONS  Drink plenty of water. Water helps thin the mucus so your sinuses can drain more easily. Use a humidifier. Inhale steam 3 to 4 times a day (for example, sit in the bathroom with the shower running). Apply a warm, moist washcloth to your face 3 to 4 times a day, or as directed by your health care provider. Use saline nasal sprays to help moisten and clean your sinuses. Take medicines only as directed by your health care provider. If you were prescribed either an antibiotic or antifungal medicine, finish it all even if you start to feel better. SEEK IMMEDIATE MEDICAL CARE IF: You have increasing pain or severe headaches. You have nausea, vomiting, or drowsiness. You have swelling around your face. You have vision problems. You have a stiff neck. You have difficulty breathing. MAKE SURE YOU:  Understand these instructions. Will watch your condition. Will get help right away if you are not doing well or get worse. Document Released: 11/13/2005 Document Revised: 03/30/2014 Document Reviewed: 11/28/2011 [employer]   Patient Information 2015 [employer]. This information is not intended to replace advice given to you by your health care provider. Make sure you discuss any questions you have with your health care provider.   

## 2015-01-13 LAB — VITAMIN D 25 HYDROXY (VIT D DEFICIENCY, FRACTURES): VIT D 25 HYDROXY: 52 ng/mL (ref 30–100)

## 2015-01-18 ENCOUNTER — Other Ambulatory Visit: Payer: Self-pay | Admitting: Internal Medicine

## 2015-02-10 ENCOUNTER — Ambulatory Visit (INDEPENDENT_AMBULATORY_CARE_PROVIDER_SITE_OTHER): Payer: Medicare Other | Admitting: *Deleted

## 2015-02-10 DIAGNOSIS — R7989 Other specified abnormal findings of blood chemistry: Secondary | ICD-10-CM

## 2015-02-10 LAB — TSH: TSH: 8.99 u[IU]/mL — AB (ref 0.350–4.500)

## 2015-02-10 NOTE — Progress Notes (Signed)
Patient ID: Desiree Anderson, female   DOB: 1956/03/26, 59 y.o.   MRN: [id number] Patient presents for 1 month recheck TSH.  Patient states she currently taking Levothyroxine 88 mcg 1/2 a pill daily.  Patient was advised to take 1/2 pill times 4 days and 1 pill times 3 days, but thought she was supposed to take 1/2 QD.

## 2015-02-11 ENCOUNTER — Other Ambulatory Visit: Payer: Self-pay | Admitting: Internal Medicine

## 2015-02-20 ENCOUNTER — Other Ambulatory Visit: Payer: Self-pay | Admitting: Physician Assistant

## 2015-02-21 ENCOUNTER — Other Ambulatory Visit: Payer: Self-pay | Admitting: Internal Medicine

## 2015-03-03 ENCOUNTER — Other Ambulatory Visit: Payer: Self-pay | Admitting: Obstetrics and Gynecology

## 2015-03-04 LAB — CYTOLOGY - PAP

## 2015-03-13 ENCOUNTER — Other Ambulatory Visit: Payer: Self-pay | Admitting: Physician Assistant

## 2015-03-16 ENCOUNTER — Other Ambulatory Visit: Payer: Medicare Other

## 2015-03-16 DIAGNOSIS — R7989 Other specified abnormal findings of blood chemistry: Secondary | ICD-10-CM

## 2015-03-16 LAB — TSH: TSH: 0.76 u[IU]/mL (ref 0.350–4.500)

## 2015-04-05 ENCOUNTER — Other Ambulatory Visit: Payer: Self-pay | Admitting: Internal Medicine

## 2015-04-12 ENCOUNTER — Other Ambulatory Visit: Payer: Self-pay | Admitting: Internal Medicine

## 2015-04-14 ENCOUNTER — Ambulatory Visit (INDEPENDENT_AMBULATORY_CARE_PROVIDER_SITE_OTHER): Payer: Medicare Other | Admitting: Internal Medicine

## 2015-04-14 ENCOUNTER — Encounter: Payer: Self-pay | Admitting: Internal Medicine

## 2015-04-14 VITALS — BP 124/80 | HR 80 | Temp 97.5°F | Resp 16 | Ht 63.0 in | Wt 165.7 lb

## 2015-04-14 DIAGNOSIS — E782 Mixed hyperlipidemia: Secondary | ICD-10-CM

## 2015-04-14 DIAGNOSIS — R7303 Prediabetes: Secondary | ICD-10-CM

## 2015-04-14 DIAGNOSIS — M51369 Other intervertebral disc degeneration, lumbar region without mention of lumbar back pain or lower extremity pain: Secondary | ICD-10-CM

## 2015-04-14 DIAGNOSIS — M797 Fibromyalgia: Secondary | ICD-10-CM

## 2015-04-14 DIAGNOSIS — Z1331 Encounter for screening for depression: Secondary | ICD-10-CM

## 2015-04-14 DIAGNOSIS — R6889 Other general symptoms and signs: Secondary | ICD-10-CM

## 2015-04-14 DIAGNOSIS — Z9181 History of falling: Secondary | ICD-10-CM

## 2015-04-14 DIAGNOSIS — M5136 Other intervertebral disc degeneration, lumbar region: Secondary | ICD-10-CM

## 2015-04-14 DIAGNOSIS — E559 Vitamin D deficiency, unspecified: Secondary | ICD-10-CM

## 2015-04-14 DIAGNOSIS — R7309 Other abnormal glucose: Secondary | ICD-10-CM

## 2015-04-14 DIAGNOSIS — Z79899 Other long term (current) drug therapy: Secondary | ICD-10-CM

## 2015-04-14 DIAGNOSIS — Z0000 Encounter for general adult medical examination without abnormal findings: Secondary | ICD-10-CM

## 2015-04-14 DIAGNOSIS — I10 Essential (primary) hypertension: Secondary | ICD-10-CM

## 2015-04-14 DIAGNOSIS — Z0001 Encounter for general adult medical examination with abnormal findings: Secondary | ICD-10-CM

## 2015-04-14 LAB — CBC WITH DIFFERENTIAL/PLATELET
BASOS PCT: 0 % (ref 0–1)
Basophils Absolute: 0 10*3/uL (ref 0.0–0.1)
Eosinophils Absolute: 0.1 10*3/uL (ref 0.0–0.7)
Eosinophils Relative: 1 % (ref 0–5)
HCT: 42 % (ref 36.0–46.0)
Hemoglobin: 14.1 g/dL (ref 12.0–15.0)
LYMPHS ABS: 4.8 10*3/uL — AB (ref 0.7–4.0)
LYMPHS PCT: 43 % (ref 12–46)
MCH: 27.5 pg (ref 26.0–34.0)
MCHC: 33.6 g/dL (ref 30.0–36.0)
MCV: 82 fL (ref 78.0–100.0)
MONOS PCT: 7 % (ref 3–12)
MPV: 9.8 fL (ref 8.6–12.4)
Monocytes Absolute: 0.8 10*3/uL (ref 0.1–1.0)
NEUTROS PCT: 49 % (ref 43–77)
Neutro Abs: 5.4 10*3/uL (ref 1.7–7.7)
Platelets: 319 10*3/uL (ref 150–400)
RBC: 5.12 MIL/uL — ABNORMAL HIGH (ref 3.87–5.11)
RDW: 13.9 % (ref 11.5–15.5)
WBC: 11.1 10*3/uL — AB (ref 4.0–10.5)

## 2015-04-14 LAB — TSH: TSH: 0.411 u[IU]/mL (ref 0.350–4.500)

## 2015-04-14 NOTE — Progress Notes (Signed)
Patient ID: Desiree Anderson, female   DOB: Dec 18, 1955, 59 y.o.   MRN: [id number]  MEDICARE ANNUAL WELLNESS VISIT AND OV  Assessment:   1. Essential hypertension  - TSH  2. Mixed hyperlipidemia  - Lipid panel  3. Prediabetes  - Hemoglobin A1c - Insulin, random  4. Vitamin D deficiency  - Vit D  25 hydroxy (rtn osteoporosis monitoring)  5. DDD, lumbar   6. Fibromyalgia Syndrome   7. Depression screen   8. At low risk for fall   9. Medication management  - CBC with Differential/Platelet - BASIC METABOLIC PANEL WITH GFR - Hepatic function panel - Magnesium  Plan:   During the course of the visit the patient was educated and counseled about appropriate screening and preventive services including:    Pneumococcal vaccine   Influenza vaccine  Td vaccine  Screening electrocardiogram  Bone densitometry screening  Colorectal cancer screening  Diabetes screening  Glaucoma screening  Nutrition counseling   Advanced directives: requested  Screening recommendations, referrals: Vaccinations:  Immunization History  Administered Date(s) Administered  . Influenza Split 09/18/2013, 09/25/2014  . Pneumococcal-Unspecified 11/27/2000  . Tdap 03/16/2011  Prevnar vaccine not available Shingles vaccine undecided Hep B vaccine not indicated  Nutrition assessed and recommended  Colonoscopy 05/2015 & due in july Recommended yearly ophthalmology/optometry visit for glaucoma screening and checkup Recommended yearly dental visit for hygiene and checkup Advanced directives - Has papers   Conditions/risks identified: BMI: Discussed weight loss, diet, and increase physical activity.  Increase physical activity: AHA recommends 150 minutes of physical activity a week.  Medications reviewed PreDiabetes is not at goal, ACE/ARB therapy: not indicated Urinary Incontinence is not an issue: discussed non pharmacology and pharmacology options.  Fall risk: low- discussed PT,  home fall assessment, medications.   Subjective:    Desiree Anderson  presents for Medicare Annual Wellness Visit and complete physical.  Nom prior medicare wellness visit is known.  This very nice 59 y.o. MWF presents also  for 3 month follow up with Hypertension, Hyperlipidemia, Pre-Diabetes and Vitamin D Deficiency. Patient is on SS Disability since 2005 for Fibromyalgia and Interstitial Cystitis.    Patient is treated for HTN since 2004  & BP has been controlled at home. Today's BP: 124/80 mmHg. Patient has had no complaints of any cardiac type chest pain, palpitations, dyspnea/orthopnea/PND, dizziness, claudication, or dependent edema.   Hyperlipidemia is controlled with diet & meds. Patient denies myalgias or other med SE's. Last Lipids were at goal - Total Chol 168; HDL 61; LDL 77; Trig 151 on 01/12/2015.   Also, the patient has history of PreDiabetes since July 2012 with A1c 5.7%and has had no symptoms of reactive hypoglycemia, diabetic polys, paresthesias or visual blurring.  Last A1c was 5.8% on 01/12/2015.   Further, the patient also has history of Vitamin D Deficiency of 11 in 2008 and supplements vitamin D without any suspected side-effects. Last vitamin D was 52 on 01/12/2015.  Names of Other Physician/Practitioners you currently use: 1. [facility] here for primary care 2. [employer], last visit May 2015 had cataract Surg.  3. Dr Illene Labrador, DDS in [place], dentist, last visit 2014  Patient Care Team: Lucky Cowboy, MD as PCP - General (Internal Medicine) Pricilla Riffle, MD as Consulting Physician (Cardiology) Eldred Manges, MD as Consulting Physician (Orthopedic Surgery) Jethro Bolus, MD as Consulting Physician (Urology) Waynard Reeds, MD as Consulting Physician (Obstetrics and Gynecology)  Medication Review: Medication Sig  . ALPRAZolam (XANAX) 0.5  MG tablet TAKE ONE TABLET BY MOUTH THREE TIMES DAILY AS NEEDED  . B Complex-C (SUPER  B COMPLEX PO) Take 1 tablet by mouth daily.   . cetirizine (ZYRTEC) 10 MG tablet Take 10 mg by mouth daily as needed for allergies.  . Cholecalciferol (VITAMIN D-3) 5000 UNITS TABS Take 1 tablet by mouth daily.  . CRESTOR 20 MG tablet TAKE ONE TABLET BY MOUTH ONCE DAILY FOR CHOLESTEROL.  . fluticasone (FLONASE) 50 MCG/ACT nasal spray Place 2 sprays into both nostrils daily at 12 noon.  Marland Kitchen guaiFENesin (MUCINEX) 600 MG 12 hr tablet Take 600 mg by mouth 2 (two) times daily as needed.  . hydrochlorothiazide (HYDRODIURIL) 25 MG tablet TAKE ONE TABLET BY MOUTH ONCE DAILY  . HYDROcodone-acetaminophen (NORCO/VICODIN) 5-325 MG per tablet Take 1/2 to 1 tablet every 4 hours as needed for severe pain.  Marland Kitchen KLOR-CON M20 20 MEQ tablet TAKE ONE TABLET BY MOUTH TWICE DAILY  . levothyroxine (SYNTHROID, LEVOTHROID) 88 MCG tablet Take 1 tablet (88 mcg total) by mouth daily before breakfast.  . Multiple Vitamins-Minerals (GERITABS PO) Take 1 tablet by mouth daily.  . ondansetron (ZOFRAN) 4 MG tablet Take 1 tablet (4 mg total) by mouth every 8 (eight) hours as needed for nausea (may repeat dose in 1 hr if no improvement).  . ranitidine (ZANTAC) 300 MG tablet TAKE ONE TABLET BY MOUTH TWICE DAILY  . rosuvastatin (CRESTOR) 40 MG tablet Take 1/2 to 1 tablet daily or as directed for cholesterol.  . triazolam (HALCION) 0.25 MG tablet TAKE ONE TABLET BY MOUTH AT BEDTIME   Current Problems (verified) Patient Active Problem List   Diagnosis Date Noted  . Medication management 09/24/2014  . Cystocele, midline 03/23/2014  . Essential hypertension 12/10/2013  . Mixed hyperlipidemia 12/10/2013  . Prediabetes 12/10/2013  . Vitamin D deficiency 12/10/2013  . DJD  12/10/2013  . DDD, lumbar 12/10/2013  . Fibromyalgia Syndrome 12/10/2013  . Coitalgia 09/21/2011  . Alteration in bowel elimination: incontinence 09/21/2011  . Abnormal female sexual function 09/21/2011    Screening Tests Health Maintenance  Topic Date Due  .  HIV Screening  11/30/1972  . MAMMOGRAM  12/01/2007  . COLONOSCOPY  12/01/2007  . INFLUENZA VACCINE  06/28/2015  . PAP SMEAR  03/02/2018  . TETANUS/TDAP  03/15/2021    Immunization History  Administered Date(s) Administered  . Influenza Split 09/18/2013, 09/25/2014  . Pneumococcal-Unspecified 11/27/2000  . Tdap 03/16/2011    Preventative care: Last colonoscopy: July 2006 and due in July  History reviewed: allergies, current medications, past family history, past medical history, past social history, past surgical history and problem list  Risk Factors: Tobacco History  Substance Use Topics  . Smoking status: Former Smoker -- 20 years    Types: Cigarettes    Quit date: 11/28/1995  . Smokeless tobacco: Current User    Types: Snuff     Comment: 4oz can last 14-16days  . Alcohol Use: No   She does not smoke.  Patient is a former smoker. Are there smokers in your home (other than you)?  No Alcohol Current alcohol use: none  Caffeine Current caffeine use: tea 1 glass /day  Exercise Current exercise: none  Nutrition/Diet Current diet: in general, a "healthy" diet    Cardiac risk factors: dyslipidemia, hypertension, obesity (BMI >= 30 kg/m2) and smoking/ tobacco exposure.  Depression Screen (Note: if answer to either of the following is "Yes", a more complete depression screening is indicated)   Q1: Over the past two weeks,  have you felt down, depressed or hopeless? No  Q2: Over the past two weeks, have you felt little interest or pleasure in doing things? No  Have you lost interest or pleasure in daily life? No  Do you often feel hopeless? No  Do you cry easily over simple problems? No  Activities of Daily Living In your present state of health, do you have any difficulty performing the following activities?:  Driving? No Managing money?  No Feeding yourself? No Getting from bed to chair? No Climbing a flight of stairs? No Preparing food and eating?: No Bathing  or showering? No Getting dressed: No Getting to the toilet? No Using the toilet:No Moving around from place to place: No In the past year have you fallen or had a near fall?:No   Are you sexually active?  Yes  Do you have more than one partner?  No  Vision Difficulties: No  Hearing Difficulties: No Do you often ask people to speak up or repeat themselves? No Do you experience ringing or noises in your ears? No Do you have difficulty understanding soft or whispered voices? Sometimes.  Cognition  Do you feel that you have a problem with memory?No  Do you often misplace items? No  Do you feel safe at home?  Yes  Advanced directives Does patient have a Health Care Power of Attorney? No Does patient have a Living Will? No  Past Medical History  Diagnosis Date  . Hypertension   . Fibromyalgia   . Hypothyroidism   . GERD (gastroesophageal reflux disease)   . DDD (degenerative disc disease), lumbar   . Anxiety   . Depression   . Erosion of vaginal mesh   . Rectocele   . History of gastric ulcer    Past Surgical History  Procedure Laterality Date  . Incontinence surgery  2009  &  2005  . Bladder tack  2003  . Breast lumpectomy  1985  . Vein bypass surgery  AGE 4    RIGHT LEG  . Transthoracic echocardiogram  06-16-2013    GRADE I DIASTOLIC DYSFUNCTION/  EF 55-60%/  MILD AR/  TRIVIAL MR  &  TR  . Cardiovascular stress test  07-02-2013  [facility]    LOW RISK NUCLEAR STUDY WITH A SMALL, MILD APICAL SEPTAL FIXED PERFUSION DEFECT .  GIVEN NORMAL WALL FUNCTION , SUSPECT REPRESENTS ATTENUATION/  EF 74%/  NO ISCHEMIA  . Cysto/  hydrodistention/  bladder bx/   instillation therapy  08-29-2010  . Vaginal hysterectomy  2000    w/  unilateral salpingoophorectomy  . Cataract extraction w/ intraocular lens implant Left 01/2014  . Excision of mesh N/A 03/23/2014    Procedure: EXCISION OF VAGINAL AND PERI-URETHRAL MESH, EXTRACTION FROM URETHRA TO APEX, insertion of XENFORM in the  vaginal vault;  Surgeon: Kathi Ludwig, MD;  Location: [facility];  Service: Urology;  Laterality: N/A;  . Excision vaginal cyst N/A 04/06/2014    Procedure: VAGINAL SPECULUM EXAMINATION/POSSIBLE DRAINAGE OF HEMATOMA;  Surgeon: Kathi Ludwig, MD;  [facility];  Service: Urology;  Laterality: N/A;    ROS: Constitutional: Denies fever, chills, weight loss/gain, headaches, insomnia, fatigue, night sweats, and change in appetite. Eyes: Denies redness, blurred vision, diplopia, discharge, itchy, watery eyes.  ENT: Denies discharge, congestion, post nasal drip, epistaxis, sore throat, earache, hearing loss, dental pain, Tinnitus, Vertigo, Sinus pain, snoring.  Cardio: Denies chest pain, palpitations, irregular heartbeat, syncope, dyspnea, diaphoresis, orthopnea, PND, claudication, edema  Respiratory: denies cough, dyspnea, DOE, pleurisy, hoarseness, laryngitis, wheezing.  Gastrointestinal: Denies dysphagia, heartburn, reflux, water brash, pain, cramps, nausea, vomiting, bloating, diarrhea, constipation, hematemesis, melena, hematochezia, jaundice, hemorrhoids Genitourinary: Denies dysuria, frequency, urgency, nocturia, hesitancy, discharge, hematuria, flank pain Breast: Breast lumps, nipple discharge, bleeding.  Musculoskeletal: Denies arthralgia, myalgia, stiffness, Jt. Swelling, pain, limp, and strain/sprain. Denies falls. Skin: Denies puritis, rash, hives, warts, acne, eczema, changing in skin lesion Neuro: No weakness, tremor, incoordination, spasms, paresthesia, pain Psychiatric: Denies confusion, memory loss, sensory loss. Denies Depression. Endocrine: Denies change in weight, skin, hair change, nocturia, and paresthesia, diabetic polys, visual blurring, hyper / hypo glycemic episodes.  Heme/Lymph: No excessive bleeding, bruising, enlarged lymph nodes  Objective:     BP 124/80 mmHg  Pulse 80  Temp(Src) 97.5 F (36.4 C)  Resp 16  Ht 5'  3" (1.6 m)  Wt 165 lb 11 oz (75.156 kg)  BMI 29.36 kg/m2  General Appearance: Well nourished, alert, WD/WN, female and in no apparent distress. Eyes: PERRLA, EOMs, conjunctiva no swelling or erythema, normal fundi and vessels. Sinuses: No frontal/maxillary tenderness ENT/Mouth: EACs patent / TMs  nl. Nares clear without erythema, swelling, mucoid exudates. Oral hygiene is good. No erythema, swelling, or exudate. Tongue normal, non-obstructing. Tonsils not swollen or erythematous. Hearing normal.  Neck: Supple, thyroid normal. No bruits, nodes or JVD. Respiratory: Respiratory effort normal.  BS equal and clear bilateral without rales, rhonci, wheezing or stridor. Cardio: Heart sounds are normal with regular rate and rhythm and no murmurs, rubs or gallops. Peripheral pulses are normal and equal bilaterally without edema. No aortic or femoral bruits. Chest: symmetric with normal excursions and percussion.  Abdomen: Flat, soft  with nl bowel sounds. Nontender, no guarding, rebound, hernias, masses, or organomegaly.  Lymphatics: Non tender without lymphadenopathy.  Musculoskeletal: Full ROM all peripheral extremities, joint stability, 5/5 strength, and normal gait. Skin: Warm and dry without rashes, lesions, cyanosis, clubbing or  ecchymosis.  Neuro: Cranial nerves intact, reflexes equal bilaterally. Normal muscle tone, no cerebellar symptoms. Sensation intact.  Pysch: Alert and oriented X 3, normal affect, Insight and Judgment appropriate.   Cognitive Testing  Alert? Yes  Normal Appearance?Yes  Oriented to person? Yes  Place? Yes   Time? Yes  Recall of three objects?  Yes  Can perform simple calculations? Yes  Displays appropriate judgment? Yes  Can read the correct time from a watch/clock?Yes  Medicare Attestation I have personally reviewed: [name] medical and social history Their use of alcohol, tobacco or illicit drugs Their current medications and supplements [name]  functional ability including ADLs,fall risks, home safety risks, cognitive, and hearing and visual impairment Diet and physical activities Evidence for depression or mood disorders  [name] weight, height, BMI, and visual acuity have been recorded in the chart.  I have made referrals, counseling, and provided education to the patient based on review of the above and I have provided the patient with a written personalized care plan for preventive services.  Over 40 minutes of exam, counseling, chart review was performed.  Nayellie Sanseverino DAVID, MD   04/14/2015

## 2015-04-14 NOTE — Patient Instructions (Signed)
Recommend Low dose or baby Aspirin 81 mg daily   To reduce risk of Colon Cancer 20 %, Skin Cancer 26 % , Melanoma 46% and   Pancreatic cancer 60%  ++++++++++++++++++ Vitamin D goal is between 70-100.   Please make sure that you are taking your Vitamin D as directed.   It is very important as a natural antiinflammatory   helping hair, skin, and nails, as well as reducing stroke and heart attack risk.   It helps your bones and helps with mood.  It also decreases numerous cancer risks so please take it as directed.   Low Vit D is associated with a 200-300% higher risk for CANCER   and 200-300% higher risk for HEART   ATTACK  &  STROKE.    .....................................Marland Kitchen  It is also associated with higher death rate at younger ages,   autoimmune diseases like Rheumatoid arthritis, Lupus, Multiple Sclerosis.     Also many other serious conditions, like depression, Alzheimer's  Dementia, infertility, muscle aches, fatigue, fibromyalgia - just to name a few.  +++++++++++++++++++    Recommend the book "The END of DIETING" by Dr Excell Seltzer   & the book "The END of DIABETES " by Dr Excell Seltzer  At [email or web address] - get book & Audio CD's     Being diabetic has a  300% increased risk for heart attack, stroke, cancer, and alzheimer- type vascular dementia. It is very important that you work harder with diet by avoiding all foods that are white. Avoid white rice (brown & wild rice is OK), white potatoes (sweetpotatoes in moderation is OK), White bread or wheat bread or anything made out of white flour like bagels, donuts, rolls, buns, biscuits, cakes, pastries, cookies, pizza crust, and pasta (made from white flour & egg whites) - vegetarian pasta or spinach or wheat pasta is OK. Multigrain breads like Arnold's or Pepperidge [employer], or multigrain sandwich thins or flatbreads.  Diet, exercise and weight loss can reverse and cure diabetes in the early stages.  Diet, exercise and weight  loss is very important in the control and prevention of complications of diabetes which affects every system in your body, ie. Brain - dementia/stroke, eyes - glaucoma/blindness, heart - heart attack/heart failure, kidneys - dialysis, stomach - gastric paralysis, intestines - malabsorption, nerves - severe painful neuritis, circulation - gangrene & loss of a leg(s), and finally cancer and Alzheimers.    I recommend avoid fried & greasy foods,  sweets/candy, white rice (brown or wild rice or Quinoa is OK), white potatoes (sweet potatoes are OK) - anything made from white flour - bagels, doughnuts, rolls, buns, biscuits,white and wheat breads, pizza crust and traditional pasta made of white flour & egg white(vegetarian pasta or spinach or wheat pasta is OK).  Multi-grain bread is OK - like multi-grain flat bread or sandwich thins. Avoid alcohol in excess. Exercise is also important.    Eat all the vegetables you want - avoid meat, especially red meat and dairy - especially cheese.  Cheese is the most concentrated form of trans-fats which is the worst thing to clog up our arteries. Veggie cheese is OK which can be found in the fresh produce section at [employer] or [employer] or [employer]  ++++++++++++++++++++++++++ Preventative Care for Adults - Female      MAINTAIN REGULAR HEALTH EXAMS:  A routine yearly physical is a good way to check in with your primary care provider about your health and preventive screening. It is also an  opportunity to share updates about your health and any concerns you have, and receive a thorough all-over exam.   Most health insurance companies pay for at least some preventative services.  Check with your health plan for specific coverages.  WHAT PREVENTATIVE SERVICES DO WOMEN NEED?  Adult women should have their weight and blood pressure checked regularly.   Women age 33 and older should have their cholesterol levels checked regularly.  Women should be screened for  cervical cancer with a Pap smear and pelvic exam beginning at either age 35, or 3 years after they become sexually activity.    Breast cancer screening generally begins at age 52 with a mammogram and breast exam by your primary care provider.    Beginning at age 30 and continuing to age 79, women should be screened for colorectal cancer.  Certain people may need continued testing until age 10.  Updating vaccinations is part of preventative care.  Vaccinations help protect against diseases such as the flu.  Osteoporosis is a disease in which the bones lose minerals and strength as we age. Women ages 36 and over should discuss this with their caregivers, as should women after menopause who have other risk factors.  Lab tests are generally done as part of preventative care to screen for anemia and blood disorders, to screen for problems with the kidneys and liver, to screen for bladder problems, to check blood sugar, and to check your cholesterol level.  Preventative services generally include counseling about diet, exercise, avoiding tobacco, drugs, excessive alcohol consumption, and sexually transmitted infections.    GENERAL RECOMMENDATIONS FOR GOOD HEALTH:  Healthy diet:  Eat a variety of foods, including fruit, vegetables, animal or vegetable protein, such as meat, fish, chicken, and eggs, or beans, lentils, tofu, and grains, such as rice.  Drink plenty of water daily.  Decrease saturated fat in the diet, avoid lots of red meat, processed foods, sweets, fast foods, and fried foods.  Exercise:  Aerobic exercise helps maintain good heart health. At least 30-40 minutes of moderate-intensity exercise is recommended. For example, a brisk walk that increases your heart rate and breathing. This should be done on most days of the week.   Find a type of exercise or a variety of exercises that you enjoy so that it becomes a part of your daily life.  Examples are running, walking, swimming, water  aerobics, and biking.  For motivation and support, explore group exercise such as aerobic class, spin class, Zumba, Yoga,or  martial arts, etc.    Set exercise goals for yourself, such as a certain weight goal, walk or run in a race such as a 5k walk/run.  Speak to your primary care provider about exercise goals.  Disease prevention:  If you smoke or chew tobacco, find out from your caregiver how to quit. It can literally save your life, no matter how long you have been a tobacco user. If you do not use tobacco, never begin.   Maintain a healthy diet and normal weight. Increased weight leads to problems with blood pressure and diabetes.   The Body Mass Index or BMI is a way of measuring how much of your body is fat. Having a BMI above 27 increases the risk of heart disease, diabetes, hypertension, stroke and other problems related to obesity. Your caregiver can help determine your BMI and based on it develop an exercise and dietary program to help you achieve or maintain this important measurement at a healthful level.  High  blood pressure causes heart and blood vessel problems.  Persistent high blood pressure should be treated with medicine if weight loss and exercise do not work.   Fat and cholesterol leaves deposits in your arteries that can block them. This causes heart disease and vessel disease elsewhere in your body.  If your cholesterol is found to be high, or if you have heart disease or certain other medical conditions, then you may need to have your cholesterol monitored frequently and be treated with medication.   Ask if you should have a cardiac stress test if your history suggests this. A stress test is a test done on a treadmill that looks for heart disease. This test can find disease prior to there being a problem.  Menopause can be associated with physical symptoms and risks. Hormone replacement therapy is available to decrease these. You should talk to your caregiver about whether  starting or continuing to take hormones is right for you.   Osteoporosis is a disease in which the bones lose minerals and strength as we age. This can result in serious bone fractures. Risk of osteoporosis can be identified using a bone density scan. Women ages 69 and over should discuss this with their caregivers, as should women after menopause who have other risk factors. Ask your caregiver whether you should be taking a calcium supplement and Vitamin D, to reduce the rate of osteoporosis.   Avoid drinking alcohol in excess (more than two drinks per day).  Avoid use of street drugs. Do not share needles with anyone. Ask for professional help if you need assistance or instructions on stopping the use of alcohol, cigarettes, and/or drugs.  Brush your teeth twice a day with fluoride toothpaste, and floss once a day. Good oral hygiene prevents tooth decay and gum disease. The problems can be painful, unattractive, and can cause other health problems. Visit your dentist for a routine oral and dental check up and preventive care every 6-12 months.   Look at your skin regularly.  Use a mirror to look at your back. Notify your caregivers of changes in moles, especially if there are changes in shapes, colors, a size larger than a pencil eraser, an irregular border, or development of new moles.  Safety:  Use seatbelts 100% of the time, whether driving or as a passenger.  Use safety devices such as hearing protection if you work in environments with loud noise or significant background noise.  Use safety glasses when doing any work that could send debris in to the eyes.  Use a helmet if you ride a bike or motorcycle.  Use appropriate safety gear for contact sports.  Talk to your caregiver about gun safety.  Use sunscreen with a SPF (or skin protection factor) of 15 or greater.  Lighter skinned people are at a greater risk of skin cancer. Don't forget to also wear sunglasses in order to protect your eyes from  too much damaging sunlight. Damaging sunlight can accelerate cataract formation.   Practice safe sex. Use condoms. Condoms are used for birth control and to help reduce the spread of sexually transmitted infections (or STIs).  Some of the STIs are gonorrhea (the clap), chlamydia, syphilis, trichomonas, herpes, HPV (human papilloma virus) and HIV (human immunodeficiency virus) which causes AIDS. The herpes, HIV and HPV are viral illnesses that have no cure. These can result in disability, cancer and death.   Keep carbon monoxide and smoke detectors in your home functioning at all times. Change the batteries  every 6 months or use a model that plugs into the wall.   Vaccinations:  Stay up to date with your tetanus shots and other required immunizations. You should have a booster for tetanus every 10 years. Be sure to get your flu shot every year, since 5%-20% of the [place] population comes down with the flu. The flu vaccine changes each year, so being vaccinated once is not enough. Get your shot in the fall, before the flu season peaks.   Other vaccines to consider:  Human Papilloma Virus or HPV causes cancer of the cervix, and other infections that can be transmitted from person to person. There is a vaccine for HPV, and females should get immunized between the ages of 21 and 76. It requires a series of 3 shots.   Pneumococcal vaccine to protect against certain types of pneumonia.  This is normally recommended for adults age 47 or older.  However, adults younger than 57 years old with certain underlying conditions such as diabetes, heart or lung disease should also receive the vaccine.  Shingles vaccine to protect against Varicella Zoster if you are older than age 7, or younger than 57 years old with certain underlying illness.  Hepatitis A vaccine to protect against a form of infection of the liver by a virus acquired from food.  Hepatitis B vaccine to protect against a form of infection of the  liver by a virus acquired from blood or body fluids, particularly if you work in health care.  If you plan to travel internationally, check with your local health department for specific vaccination recommendations.  Cancer Screening:  Breast cancer screening is essential to preventive care for women. All women age 89 and older should perform a breast self-exam every month. At age 77 and older, women should have their caregiver complete a breast exam each year. Women at ages 37 and older should have a mammogram (x-ray film) of the breasts. Your caregiver can discuss how often you need mammograms.    Cervical cancer screening includes taking a Pap smear (sample of cells examined under a microscope) from the cervix (end of the uterus). It also includes testing for HPV (Human Papilloma Virus, which can cause cervical cancer). Screening and a pelvic exam should begin at age 32, or 3 years after a woman becomes sexually active. Screening should occur every year, with a Pap smear but no HPV testing, up to age 76. After age 88, you should have a Pap smear every 3 years with HPV testing, if no HPV was found previously.   Most routine colon cancer screening begins at the age of 56. On a yearly basis, doctors may provide special easy to use take-home tests to check for hidden blood in the stool. Sigmoidoscopy or colonoscopy can detect the earliest forms of colon cancer and is life saving. These tests use a small camera at the end of a tube to directly examine the colon. Speak to your caregiver about this at age 19, when routine screening begins (and is repeated every 5 years unless early forms of pre-cancerous polyps or small growths are found).

## 2015-04-15 LAB — BASIC METABOLIC PANEL WITH GFR
BUN: 10 mg/dL (ref 6–23)
CHLORIDE: 100 meq/L (ref 96–112)
CO2: 30 meq/L (ref 19–32)
Calcium: 9.5 mg/dL (ref 8.4–10.5)
Creat: 0.73 mg/dL (ref 0.50–1.10)
GFR, Est African American: >89 mL/min
GFR, Est Non African American: >89 mL/min
GLUCOSE: 96 mg/dL (ref 70–99)
POTASSIUM: 3.9 meq/L (ref 3.5–5.3)
SODIUM: 141 meq/L (ref 135–145)

## 2015-04-15 LAB — HEPATIC FUNCTION PANEL
ALT: 26 U/L (ref 0–35)
AST: 20 U/L (ref 0–37)
Albumin: 4 g/dL (ref 3.5–5.2)
Alkaline Phosphatase: 63 U/L (ref 39–117)
BILIRUBIN TOTAL: 0.3 mg/dL (ref 0.2–1.2)
Bilirubin, Direct: 0.1 mg/dL (ref 0.0–0.3)
Indirect Bilirubin: 0.2 mg/dL (ref 0.2–1.2)
Total Protein: 6.5 g/dL (ref 6.0–8.3)

## 2015-04-15 LAB — LIPID PANEL
Cholesterol: 154 mg/dL (ref 0–200)
HDL: 57 mg/dL (ref >=46)
LDL Cholesterol: 66 mg/dL (ref 0–99)
Total CHOL/HDL Ratio: 2.7 Ratio
Triglycerides: 155 mg/dL — ABNORMAL HIGH (ref <150)
VLDL: 31 mg/dL (ref 0–40)

## 2015-04-15 LAB — INSULIN, RANDOM: INSULIN: 95.3 u[IU]/mL — AB (ref 2.0–19.6)

## 2015-04-15 LAB — MAGNESIUM: Magnesium: 1.8 mg/dL (ref 1.5–2.5)

## 2015-04-15 LAB — HEMOGLOBIN A1C
Hgb A1c MFr Bld: 5.8 % — ABNORMAL HIGH (ref <5.7)
MEAN PLASMA GLUCOSE: 120 mg/dL — AB (ref <117)

## 2015-04-15 LAB — VITAMIN D 25 HYDROXY (VIT D DEFICIENCY, FRACTURES): VIT D 25 HYDROXY: 54 ng/mL (ref 30–100)

## 2015-07-12 ENCOUNTER — Other Ambulatory Visit: Payer: Self-pay | Admitting: Internal Medicine

## 2015-07-12 DIAGNOSIS — G894 Chronic pain syndrome: Secondary | ICD-10-CM

## 2015-07-12 MED ORDER — HYDROCODONE-ACETAMINOPHEN 5-325 MG PO TABS: ORAL_TABLET | ORAL | Status: AC

## 2015-07-28 ENCOUNTER — Other Ambulatory Visit: Payer: Self-pay | Admitting: Internal Medicine

## 2015-08-10 ENCOUNTER — Other Ambulatory Visit: Payer: Self-pay | Admitting: Internal Medicine

## 2015-08-11 ENCOUNTER — Ambulatory Visit (INDEPENDENT_AMBULATORY_CARE_PROVIDER_SITE_OTHER): Payer: Medicare Other | Admitting: Internal Medicine

## 2015-08-11 ENCOUNTER — Encounter: Payer: Self-pay | Admitting: Internal Medicine

## 2015-08-11 VITALS — BP 136/94 | HR 88 | Temp 97.0°F | Resp 16 | Ht 63.0 in | Wt 166.6 lb

## 2015-08-11 DIAGNOSIS — R197 Diarrhea, unspecified: Secondary | ICD-10-CM | POA: Diagnosis not present

## 2015-08-11 DIAGNOSIS — Z6829 Body mass index (BMI) 29.0-29.9, adult: Secondary | ICD-10-CM | POA: Diagnosis not present

## 2015-08-11 DIAGNOSIS — B351 Tinea unguium: Secondary | ICD-10-CM

## 2015-08-11 DIAGNOSIS — R7309 Other abnormal glucose: Secondary | ICD-10-CM

## 2015-08-11 DIAGNOSIS — K219 Gastro-esophageal reflux disease without esophagitis: Secondary | ICD-10-CM

## 2015-08-11 DIAGNOSIS — R7303 Prediabetes: Secondary | ICD-10-CM

## 2015-08-11 MED ORDER — ALBENDAZOLE 200 MG PO TABS: ORAL_TABLET | ORAL | Status: AC

## 2015-08-11 MED ORDER — TERBINAFINE HCL 250 MG PO TABS: 250.0000 mg | ORAL_TABLET | Freq: Every day | ORAL | Status: DC

## 2015-08-11 NOTE — Patient Instructions (Addendum)
Pinworms Your caregiver has diagnosed you as having pinworms. These are common infections of children and less common in adults. Pinworms are a small white worm less one quarter to a half inch in length. They look like a tiny piece of white thread. A person gets pinworms by swallowing the eggs of the worm. These eggs are obtained from contaminated (infected or tainted) food, clothing, toys, or any object that comes in contact with the body and mouth. The eggs hatch in the small bowel (intestine) and quickly develop into adult worms in the large bowel (colon). The female worm develops in the large intestine for about two to four weeks. It lays eggs around the anus during the night. These eggs then contaminate clothing, fingers, bedding, and anything else they come in contact with. The main symptoms (problems) of pinworms are itching around the anus (pruritus ani) at night. Children may also have occasional abdominal (belly) pain, loss of appetite, problems sleeping, and irritability. If you or your child has continual anal itching at night, that is a good sign to consult your caregiver. Just about everybody at some time in their life has acquired pinworms. Getting them has nothing to do with the cleanliness of your household or your personal hygiene. Complications are uncommon. DIAGNOSIS  Diagnosis can be made by looking at your child's anus at night when the pinworms are laying eggs or by sticking a piece of scotch tape on the anus in the morning. The eggs will stick to the tape. This can be examined by your caregiver who can make a diagnosis by looking at the tape under a microscope. Sometimes several scotch tape swabs will be necessary.  HOME CARE INSTRUCTIONS   Your caregiver will give you medications. They should be taken as directed. Eggs are easily passed. The whole family often needs treatment even if no symptoms are present. Several treatments may be necessary. A second treatment is usually needed  after two weeks to a month.  Maintain strict hygiene. Washing hands often and keeping the nails short is helpful. Children often scratch themselves at night in their sleep so the eggs get under the nail. This causes reinfection by hand to mouth contamination.  Change bedding and clothing daily. These should be washed in hot water and dried. This kills the eggs and stops the life cycle of the worm.  Pets are not known to carry pinworms.  An ointment may be used at night for anal itching.  See your caregiver if problems continue.   ++++++++++++++++++++++++++++++++  Food Choices for Gastroesophageal Reflux Disease When you have gastroesophageal reflux disease (GERD), the foods you eat and your eating habits are very important. Choosing the right foods can help ease the discomfort of GERD. WHAT GENERAL GUIDELINES DO I NEED TO FOLLOW?  Choose fruits, vegetables, whole grains, low-fat dairy products, and low-fat meat, fish, and poultry.  Limit fats such as oils, salad dressings, butter, nuts, and avocado.  Keep a food diary to identify foods that cause symptoms.  Avoid foods that cause reflux. These may be different for different people.  Eat frequent small meals instead of three large meals each day.  Eat your meals slowly, in a relaxed setting.  Limit fried foods.  Cook foods using methods other than frying.  Avoid drinking alcohol.  Avoid drinking large amounts of liquids with your meals.  Avoid bending over or lying down until 2-3 hours after eating. WHAT FOODS ARE NOT RECOMMENDED? The following are some foods and drinks that may  worsen your symptoms: Vegetables Tomatoes. Tomato juice. Tomato and spaghetti sauce. Chili peppers. Onion and garlic. Horseradish. Fruits Oranges, grapefruit, and lemon (fruit and juice). Meats High-fat meats, fish, and poultry. This includes hot dogs, ribs, ham, sausage, salami, and bacon. Dairy Whole milk and chocolate milk. Sour cream.  Cream. Butter. Ice cream. Cream cheese.  Beverages Coffee and tea, with or without caffeine. Carbonated beverages or energy drinks. Condiments Hot sauce. Barbecue sauce.  Sweets/Desserts Chocolate and cocoa. Donuts. Peppermint and spearmint. Fats and Oils High-fat foods, including [place] fries and potato chips. Other Vinegar. Strong spices, such as black pepper, white pepper, red pepper, cayenne, curry powder, cloves, ginger, and chili powder. The items listed above may not be a complete list of foods and beverages to avoid. Contact your dietitian for more information. Document Released: 11/13/2005 Document Revised: 11/18/2013 Document Reviewed: 09/17/2013 [facility] Patient Information 2015 [place], [place]. This information is not intended to replace advice given to you by your health care provider. Make sure you discuss any questions you have with your health care provider.

## 2015-08-11 NOTE — Progress Notes (Signed)
Subjective:    Patient ID: Desiree Anderson, female    DOB: [birth year], 57 y.o.   MRN: [id number]  HPI  Patient presents with c/o abd pains /EG & lower to LLQ pain & discomfort and also increased heartburn & hot acidy type water brash & reflux.  Has has post-prandial diarrhea and also because of some peri-anal pruritis she has concerns for suspected pinworm infection from her grandchildren. Denies N/V. No noted blood in BM's. She also brought a stool specimen in a plastid zip lock for inspection which appears to have a few pinworm-like structure.  Medication Sig  . ALPRAZolam0.5 MG tablet TAKE ONE TABLET BY MOUTH THREE TIMES DAILY AS NEEDED  . SUPER B COMPLEX Take 1 tablet by mouth daily.   . cetirizine  10 MG tablet Take 10 mg by mouth daily as needed for allergies.  Marland Kitchen VITAMIN D 5000 UNITS TABS Take 2 tablets by mouth daily.   . CRESTOR 40 MG tablet TAKE ONE-HALF TO ONE TABLET BY MOUTH ONCE DAILY OR AS DIRECTED FOR CHOLESTEROL  . guaiFENesin (MUCINEX)   Take 600 mg by mouth 2 (two) times daily as needed.  . hctz 25 MG tablet TAKE ONE TABLET BY MOUTH ONCE DAILY  .  NORCO/VICODIN 5-325 MG  Take 1/2 to 1 tablet every 4 hours as needed for severe pain.  Marland Kitchen KLOR-CON M20 20 MEQ tablet TAKE ONE TABLET BY MOUTH TWICE DAILY  . levothyroxine 88 MCG tablet TAKE ONE TABLET BY MOUTH ONCE DAILY BEFORE BREAKFAST  . Multiple Vitamins-Minerals  Take 1 tablet by mouth daily.  . ranitidine  300 MG tablet TAKE ONE TABLET BY MOUTH TWICE DAILY  . triazolam  0.25 MG tablet TAKE ONE TABLET BY MOUTH AT BEDTIME  . FLONASE nasal spray Place 2 sprays into both nostrils daily at 12 noon.  . CRESTOR 20 MG tablet TAKE ONE TABLET BY MOUTH ONCE DAILY FOR CHOLESTEROL.   Allergies  Allergen Reactions  . Shellfish Allergy Diarrhea and Nausea And Vomiting   Past Medical History  Diagnosis Date  . Hypertension   . Fibromyalgia   . Hypothyroidism   . GERD (gastroesophageal reflux disease)   . DDD (degenerative disc disease),  lumbar   . Anxiety   . Depression   . Erosion of vaginal mesh   . Rectocele   . History of gastric ulcer    Review of Systems  10 point systems review negative except as above.    Objective:   Physical Exam  BP 136/94 mmHg  Pulse 88  Temp(Src) 97 F (36.1 C)  Resp 16  Ht 5\' 3"  (1.6 m)  Wt 166 lb 9.6 oz (75.569 kg)  BMI 29.52 kg/m2  HEENT - Eac's patent. TM's Nl. EOM's full. PERRLA. NasoOroPharynx clear. Neck - supple. Nl Thyroid. Carotids 2+ & No bruits, nodes, JVD Chest - Clear equal BS w/o Rales, rhonchi, wheezes. Cor - Nl HS. RRR w/o sig MGR. PP 1(+). No edema. Abd - No palpable organomegaly, masses and soft with tenderness w/o guarding in the EG > lower abd areas. . BS nl. MS- FROM w/o deformities. Muscle power, tone and bulk Nl. Gait Nl. Neuro - No obvious Cr N abnormalities. Sensory, motor and Cerebellar functions appear Nl w/o focal abnormalities. Psyche - Mental status normal & appropriate.  No delusions, ideations or obvious mood abnormalities. Skin - Patient also demeonstrated a chalky distal 1st toenail consistent with fungal infection    Assessment & Plan:   1. Gastroesophageal reflux disease  -  resume Ranitidine 300 mg back to bid and anti-dyspeptic diet discussed in details with written diet  2. Diarrhea- suspect for Pinworm infestation  - albendazole (ALBENZA) 200 MG tablet; Take 1 tablet now & repeat in 2 weeks  Dispense: 2 tablet;   3. Onychomycosis of right great toe  - terbinafine (LAMISIL) 250 MG tablet; Take 1 tablet (250 mg total) by mouth daily.  Dispense: 90 tablet; Refill: 0

## 2015-09-28 ENCOUNTER — Other Ambulatory Visit: Payer: Self-pay | Admitting: Internal Medicine

## 2015-10-11 ENCOUNTER — Ambulatory Visit (INDEPENDENT_AMBULATORY_CARE_PROVIDER_SITE_OTHER): Payer: Medicare Other | Admitting: Internal Medicine

## 2015-10-11 ENCOUNTER — Encounter: Payer: Self-pay | Admitting: Internal Medicine

## 2015-10-11 VITALS — BP 124/82 | HR 96 | Temp 97.9°F | Resp 16 | Ht 63.0 in | Wt 166.0 lb

## 2015-10-11 DIAGNOSIS — M797 Fibromyalgia: Secondary | ICD-10-CM | POA: Diagnosis not present

## 2015-10-11 DIAGNOSIS — Z6829 Body mass index (BMI) 29.0-29.9, adult: Secondary | ICD-10-CM

## 2015-10-11 DIAGNOSIS — E559 Vitamin D deficiency, unspecified: Secondary | ICD-10-CM

## 2015-10-11 DIAGNOSIS — M150 Primary generalized (osteo)arthritis: Secondary | ICD-10-CM | POA: Diagnosis not present

## 2015-10-11 DIAGNOSIS — Z789 Other specified health status: Secondary | ICD-10-CM | POA: Diagnosis not present

## 2015-10-11 DIAGNOSIS — E782 Mixed hyperlipidemia: Secondary | ICD-10-CM

## 2015-10-11 DIAGNOSIS — B370 Candidal stomatitis: Secondary | ICD-10-CM

## 2015-10-11 DIAGNOSIS — R7303 Prediabetes: Secondary | ICD-10-CM

## 2015-10-11 DIAGNOSIS — Z1389 Encounter for screening for other disorder: Secondary | ICD-10-CM

## 2015-10-11 DIAGNOSIS — Z9181 History of falling: Secondary | ICD-10-CM

## 2015-10-11 DIAGNOSIS — M5136 Other intervertebral disc degeneration, lumbar region: Secondary | ICD-10-CM

## 2015-10-11 DIAGNOSIS — I10 Essential (primary) hypertension: Secondary | ICD-10-CM

## 2015-10-11 DIAGNOSIS — Z23 Encounter for immunization: Secondary | ICD-10-CM

## 2015-10-11 DIAGNOSIS — Z1212 Encounter for screening for malignant neoplasm of rectum: Secondary | ICD-10-CM

## 2015-10-11 DIAGNOSIS — Z79899 Other long term (current) drug therapy: Secondary | ICD-10-CM

## 2015-10-11 DIAGNOSIS — M8949 Other hypertrophic osteoarthropathy, multiple sites: Secondary | ICD-10-CM

## 2015-10-11 DIAGNOSIS — E039 Hypothyroidism, unspecified: Secondary | ICD-10-CM | POA: Diagnosis not present

## 2015-10-11 DIAGNOSIS — M51369 Other intervertebral disc degeneration, lumbar region without mention of lumbar back pain or lower extremity pain: Secondary | ICD-10-CM

## 2015-10-11 DIAGNOSIS — B351 Tinea unguium: Secondary | ICD-10-CM | POA: Diagnosis not present

## 2015-10-11 DIAGNOSIS — M159 Polyosteoarthritis, unspecified: Secondary | ICD-10-CM

## 2015-10-11 DIAGNOSIS — Z1331 Encounter for screening for depression: Secondary | ICD-10-CM

## 2015-10-11 DIAGNOSIS — E669 Obesity, unspecified: Secondary | ICD-10-CM | POA: Insufficient documentation

## 2015-10-11 LAB — HEPATIC FUNCTION PANEL
ALBUMIN: 4.2 g/dL (ref 3.6–5.1)
ALK PHOS: 70 U/L (ref 33–130)
ALT: 21 U/L (ref 6–29)
AST: 22 U/L (ref 10–35)
BILIRUBIN DIRECT: 0.1 mg/dL (ref <=0.2)
BILIRUBIN TOTAL: 0.5 mg/dL (ref 0.2–1.2)
Indirect Bilirubin: 0.4 mg/dL (ref 0.2–1.2)
Total Protein: 6.7 g/dL (ref 6.1–8.1)

## 2015-10-11 LAB — CBC WITH DIFFERENTIAL/PLATELET
Basophils Absolute: 0 10*3/uL (ref 0.0–0.1)
Basophils Relative: 0 % (ref 0–1)
EOS ABS: 0 10*3/uL (ref 0.0–0.7)
EOS PCT: 0 % (ref 0–5)
HCT: 42.1 % (ref 36.0–46.0)
Hemoglobin: 13.9 g/dL (ref 12.0–15.0)
LYMPHS ABS: 5.1 10*3/uL — AB (ref 0.7–4.0)
Lymphocytes Relative: 40 % (ref 12–46)
MCH: 27.3 pg (ref 26.0–34.0)
MCHC: 33 g/dL (ref 30.0–36.0)
MCV: 82.5 fL (ref 78.0–100.0)
MONOS PCT: 6 % (ref 3–12)
MPV: 9.8 fL (ref 8.6–12.4)
Monocytes Absolute: 0.8 10*3/uL (ref 0.1–1.0)
NEUTROS PCT: 54 % (ref 43–77)
Neutro Abs: 6.9 10*3/uL (ref 1.7–7.7)
PLATELETS: 344 10*3/uL (ref 150–400)
RBC: 5.1 MIL/uL (ref 3.87–5.11)
RDW: 13.9 % (ref 11.5–15.5)
WBC: 12.7 10*3/uL — ABNORMAL HIGH (ref 4.0–10.5)

## 2015-10-11 LAB — LIPID PANEL
CHOL/HDL RATIO: 3 ratio (ref <=5.0)
Cholesterol: 156 mg/dL (ref 125–200)
HDL: 52 mg/dL (ref >=46)
LDL CALC: 75 mg/dL (ref <130)
Triglycerides: 143 mg/dL (ref <150)
VLDL: 29 mg/dL (ref <30)

## 2015-10-11 LAB — HEMOGLOBIN A1C
Hgb A1c MFr Bld: 5.6 % (ref <5.7)
Mean Plasma Glucose: 114 mg/dL (ref <117)

## 2015-10-11 LAB — TSH: TSH: 1.76 u[IU]/mL (ref 0.350–4.500)

## 2015-10-11 LAB — BASIC METABOLIC PANEL WITH GFR
BUN: 9 mg/dL (ref 7–25)
CHLORIDE: 98 mmol/L (ref 98–110)
CO2: 28 mmol/L (ref 20–31)
Calcium: 9.3 mg/dL (ref 8.6–10.4)
Creat: 0.73 mg/dL (ref 0.50–1.05)
GFR, Est African American: >89 mL/min (ref >=60)
GFR, Est Non African American: >89 mL/min (ref >=60)
Glucose, Bld: 104 mg/dL — ABNORMAL HIGH (ref 65–99)
Potassium: 3.3 mmol/L — ABNORMAL LOW (ref 3.5–5.3)
Sodium: 139 mmol/L (ref 135–146)

## 2015-10-11 LAB — MAGNESIUM: Magnesium: 1.9 mg/dL (ref 1.5–2.5)

## 2015-10-11 MED ORDER — TERBINAFINE HCL 250 MG PO TABS: 250.0000 mg | ORAL_TABLET | Freq: Every day | ORAL | Status: DC

## 2015-10-11 NOTE — Progress Notes (Signed)
Patient ID: Desiree Anderson, female   DOB: 1956-04-06, 59 y.o.   MRN: [id number]   Comprehensive Evaluation,  Examination & Management  This very nice 59 y.o. MWF presents for presents for a comprehensive evaluation, examination and management of multiple medical co-morbidities.  Patient has been followed for HTN, Prediabetes, Hyperlipidemia, Chronic pain from DDD/DJD & Fibromyalgia and Vitamin D Deficiency. Note patient has been on SS Disability for Fibromyalgia and Chronic interstitial cystitis. She does report her chronic FM pains seemingly have improved over the last few years possible with correction of her vit D.     HTN predates since 2004. Patient's BP has been controlled at home and patient denies any cardiac symptoms as chest pain, palpitations, shortness of breath, dizziness or ankle swelling. Today's BP: 124/82 mmHg    Patient's hyperlipidemia is controlled with diet and medications. Patient denies myalgias or other medication SE's. Last lipids were at goal with Cholesterol 154; HDL 57; LDL 66; and Triglycerides 155 on 04/14/2015.   Patient has prediabetes predating since 2012 & 2013 with A1c 5/7%  and patient denies reactive hypoglycemic symptoms, visual blurring, diabetic polys, or paresthesias. Last A1c was 5.8% on 04/14/2015.    Finally, patient has history of Vitamin D Deficiency of "11" and last Vitamin D was 54 on 04/14/2015  Medication Sig  . ALPRAZolam (XANAX) 0.5 MG tablet TAKE ONE TABLET BY MOUTH THREE TIMES DAILY AS NEEDED  . B Complex-C (SUPER B COMPLEX PO) Take 1 tablet by mouth daily.   . cetirizine (ZYRTEC) 10 MG tablet Take 10 mg by mouth daily as needed for allergies.  Marland Kitchen VITAMIN D 5000 UNITS TABS Take 2 tablets by mouth daily.   . CRESTOR 40 MG tablet TAKE ONE-HALF TO ONE TABLET BY MOUTH ONCE DAILY OR AS DIRECTED FOR CHOLESTEROL  . guaiFENesin  600 MG 12 hr tablet Take 600 mg by mouth 2 (two) times daily as needed.  . hctz 25 MG tablet TAKE ONE TABLET BY MOUTH ONCE DAILY  .  NORCO 5-325 Take 1/2 to 1 tablet every 4 hours as needed for severe pain.  Marland Kitchen KLOR-CON M20 20 MEQ tablet TAKE ONE TABLET BY MOUTH TWICE DAILY  . levothyroxine  88 MCG tablet TAKE ONE TABLET BY MOUTH ONCE DAILY BEFORE BREAKFAST  . Multiple Vitamins-Minerals (GERITABS PO) Take 1 tablet by mouth daily.  . ranitidine (ZANTAC) 300 MG tablet TAKE ONE TABLET BY MOUTH TWICE DAILY  . triazolam (HALCION) 0.25 MG tablet TAKE ONE TABLET BY MOUTH AT BEDTIME  . FLONASE nasal spray Place 2 sprays into both nostrils daily at 12 noon.  . terbinafine (LAMISIL) 250 MG tablet Take 1 tablet (250 mg total) by mouth daily.  Marland Kitchen VAGIFEM 10 MCG TABS vaginal tablet    Allergies  Allergen Reactions  . Shellfish Allergy Diarrhea and Nausea And Vomiting   Past Medical History  Diagnosis Date  . Hypertension   . Fibromyalgia   . Hypothyroidism   . GERD (gastroesophageal reflux disease)   . DDD (degenerative disc disease), lumbar   . Anxiety   . Depression   . Erosion of vaginal mesh ([place])   . Rectocele   . History of gastric ulcer    Health Maintenance  Topic Date Due  . Hepatitis C Screening  03-21-1956  . HIV Screening  11/30/1972  . MAMMOGRAM  12/01/2007  . COLONOSCOPY  12/01/2007  . INFLUENZA VACCINE  06/28/2015  . PAP SMEAR  03/02/2018  . TETANUS/TDAP  03/15/2021   Immunization History  Administered  Date(s) Administered  . Influenza Split 09/18/2013, 09/25/2014  . Pneumococcal-Unspecified 11/27/2000  . Tdap 03/16/2011   Past Surgical History  Procedure Laterality Date  . Incontinence surgery  2009  &  2005  . Bladder tack  2003  . Breast lumpectomy  1985  . Vein bypass surgery  AGE 71    RIGHT LEG  . Transthoracic echocardiogram  06-16-2013    GRADE I DIASTOLIC DYSFUNCTION/  EF 55-60%/  MILD AR/  TRIVIAL MR  &  TR  . Cardiovascular stress test  07-02-2013  DR [facility]    LOW RISK NUCLEAR STUDY WITH A SMALL, MILD APICAL SEPTAL FIXED PERFUSION DEFECT .  GIVEN NORMAL WALL FUNCTION ,  SUSPECT REPRESENTS ATTENUATION/  EF 74%/  NO ISCHEMIA  . Cysto/  hydrodistention/  bladder bx/   instillation therapy  08-29-2010  . Vaginal hysterectomy  2000    w/  unilateral salpingoophorectomy  . Cataract extraction w/ intraocular lens implant Left 01/2014  . Excision of mesh N/A 03/23/2014    Procedure: EXCISION OF VAGINAL AND PERI-URETHRAL MESH, EXTRACTION FROM URETHRA TO APEX, insertion of XENFORM in the vaginal vault;  Surgeon: Ailene Rud, MD;  Location: [facility];  Service: Urology;  Laterality: N/A;  . Excision vaginal cyst N/A 04/06/2014    Procedure: VAGINAL SPECULUM EXAMINATION/POSSIBLE DRAINAGE OF HEMATOMA;  Surgeon: Ailene Rud, MD;  Location: [facility];  Service: Urology;  Laterality: N/A;   Social History  Substance Use Topics  . Smoking status: Former Smoker -- 20 years    Types: Cigarettes    Quit date: 11/28/1995  . Smokeless tobacco: Current User    Types: Snuff     Comment: 4oz can last 14-16days  . Alcohol Use: No    ROS Constitutional: Denies fever, chills, weight loss/gain, headaches, insomnia,  night sweats, and change in appetite. Does c/o fatigue. Eyes: Denies redness, blurred vision, diplopia, discharge, itchy, watery eyes.  ENT: Denies discharge, congestion, post nasal drip, epistaxis, sore throat, earache, hearing loss, dental pain, Tinnitus, Vertigo, Sinus pain, snoring.  Cardio: Denies chest pain, palpitations, irregular heartbeat, syncope, dyspnea, diaphoresis, orthopnea, PND, claudication, edema Respiratory: denies cough, dyspnea, DOE, pleurisy, hoarseness, laryngitis, wheezing.  Gastrointestinal: Denies dysphagia, heartburn, reflux, water brash, pain, cramps, nausea, vomiting, bloating, diarrhea, constipation, hematemesis, melena, hematochezia, jaundice, hemorrhoids Genitourinary: Denies dysuria, frequency, urgency, nocturia, hesitancy, discharge, hematuria, flank pain Breast: Breast lumps, nipple  discharge, bleeding.  Musculoskeletal: Denies arthralgia, myalgia, stiffness, Jt. Swelling, pain, limp, and strain/sprain. Denies falls. Skin: Denies puritis, rash, hives, warts, acne, eczema, changing in skin lesion Neuro: No weakness, tremor, incoordination, spasms, paresthesia, pain Psychiatric: Denies confusion, memory loss, sensory loss. Denies Depression. Endocrine: Denies change in weight, skin, hair change, nocturia, and paresthesia, diabetic polys, visual blurring, hyper / hypo glycemic episodes.  Heme/Lymph: No excessive bleeding, bruising, enlarged lymph nodes.   Physical Exam  BP 124/82 mmHg  Pulse 96  Temp(Src) 97.9 F (36.6 C)  Resp 16  Ht 5\' 3"  (1.6 m)  Wt 166 lb (75.297 kg)  BMI 29.41 kg/m2  General Appearance: Well nourished and in no apparent distress. Eyes: PERRLA, EOMs, conjunctiva no swelling or erythema, normal fundi and vessels. Sinuses: No frontal/maxillary tenderness ENT/Mouth: EACs patent / TMs  nl. Nares clear without erythema, swelling, mucoid exudates. Oral thrush noted on hard & soft palates and buccal mucosa. No erythema, swelling, or exudate. Tongue normal, non-obstructing. Tonsils not swollen or erythematous. Hearing normal.  Neck: Supple, thyroid normal. No bruits, nodes or  JVD. Respiratory: Respiratory effort normal.  BS equal and clear bilateral without rales, rhonci, wheezing or stridor. Cardio: Heart sounds are normal with regular rate and rhythm and no murmurs, rubs or gallops. Peripheral pulses are normal and equal bilaterally without edema. No aortic or femoral bruits. Chest: symmetric with normal excursions and percussion. Breasts: Symmetric, without lumps, nipple discharge, retractions, or fibrocystic changes.  Abdomen: Flat, soft, with bowel sounds. Nontender, no guarding, rebound, hernias, masses, or organomegaly.  Lymphatics: Non tender without lymphadenopathy.  Genitourinary:  Musculoskeletal: Full ROM all peripheral extremities, joint  stability, 5/5 strength, and normal gait. Skin: Warm and dry without rashes, lesions, cyanosis, clubbing or  ecchymosis. Noted dystrophic chalky toenails.  Neuro: Cranial nerves intact, reflexes equal bilaterally. Normal muscle tone, no cerebellar symptoms. Sensation intact.  Pysch: Alert and oriented X 3, normal affect, Insight and Judgment appropriate.   Assessment and Plan  1. Essential hypertension  - Microalbumin / creatinine urine ratio - EKG 12-Lead - [place], RETROPERITNL ABD,  LTD - TSH  2. Mixed hyperlipidemia  - Lipid panel  3. Prediabetes  - Hemoglobin A1c - Insulin, random  4. Vitamin D deficiency  - VITAMIN D 25 Hydroxy   5. Fibromyalgia Syndrome   6. DDD, lumbar   7. Primary osteoarthritis involving multiple joints   8. Screening for rectal cancer   9. At low risk for fall   10. Hypothyroidism - TSH  11. BMI 29.0-29.9,adult   12. Need for prophylactic vaccination and inoculation against influenza  - Flu vaccine greater than or equal to 3yo preservative free IM  13. Onychomycosis of toenail  - terbinafine (LAMISIL) 250 MG tablet; Take 1 tablet (250 mg total) by mouth daily.  Dispense: 90 tablet; Refill: 0  14. Oral thrush  - terbinafine (LAMISIL) 250 MG tablet; Take 1 tablet (250 mg total) by mouth daily.  Dispense: 90 tablet; Refill: 0  15. Medication management  - Urinalysis, Routine w reflex microscopic  - CBC with Differential/Platelet - BASIC METABOLIC PANEL WITH GFR - Hepatic function panel - Magnesium  16. Depression screen  - Negative   Continue prudent diet as discussed, weight control, BP monitoring, regular exercise, and medications. Discussed med's effects and SE's. Screening labs and tests as requested with regular follow-up as recommended.  Over 40 minutes of exam, counseling, chart review was performed.

## 2015-10-11 NOTE — Patient Instructions (Signed)
Recommend Adult Low Dose Aspirin or   coated  Aspirin 81 mg daily   To reduce risk of Colon Cancer 20 %,   Skin Cancer 26 % ,   Melanoma 46%   and   Pancreatic cancer 60%   ++++++++++++++++++++++++++++++++++++++++++++++++++++++  Vitamin D goal   is between 70-100.   Please make sure that you are taking your Vitamin D as directed.   It is very important as a natural anti-inflammatory   helping hair, skin, and nails, as well as reducing stroke and heart attack risk.   It helps your bones and helps with mood.  It also decreases numerous cancer risks so please take it as directed.   Low Vit D is associated with a 200-300% higher risk for CANCER   and 200-300% higher risk for HEART   ATTACK  &  STROKE.   ......................................  It is also associated with higher death rate at younger ages,   autoimmune diseases like Rheumatoid arthritis, Lupus, Multiple Sclerosis.     Also many other serious conditions, like depression, Alzheimer's  Dementia, infertility, muscle aches, fatigue, fibromyalgia - just to name a few.  ++++++++++++++++++++++++++++++++++++++++++++++++  Recommend the book "The END of DIETING" by Dr Joel Fuhrman   & the book "The END of DIABETES " by Dr Joel Fuhrman  At [employer] - get book & Audio CD's     Being diabetic has a  300% increased risk for heart attack, stroke, cancer, and alzheimer- type vascular dementia. It is very important that you work harder with diet by avoiding all foods that are white. Avoid white rice (brown & wild rice is OK), white potatoes (sweetpotatoes in moderation is OK), White bread or wheat bread or anything made out of white flour like bagels, donuts, rolls, buns, biscuits, cakes, pastries, cookies, pizza crust, and pasta (made from white flour & egg whites) - vegetarian pasta or spinach or wheat pasta is OK. Multigrain breads like Arnold's or [employer], or multigrain sandwich thins or flatbreads.  Diet,  exercise and weight loss can reverse and cure diabetes in the early stages.  Diet, exercise and weight loss is very important in the control and prevention of complications of diabetes which affects every system in your body, ie. Brain - dementia/stroke, eyes - glaucoma/blindness, heart - heart attack/heart failure, kidneys - dialysis, stomach - gastric paralysis, intestines - malabsorption, nerves - severe painful neuritis, circulation - gangrene & loss of a leg(s), and finally cancer and Alzheimers.    I recommend avoid fried & greasy foods,  sweets/candy, white rice (brown or wild rice or Quinoa is OK), white potatoes (sweet potatoes are OK) - anything made from white flour - bagels, doughnuts, rolls, buns, biscuits,white and wheat breads, pizza crust and traditional pasta made of white flour & egg white(vegetarian pasta or spinach or wheat pasta is OK).  Multi-grain bread is OK - like multi-grain flat bread or sandwich thins. Avoid alcohol in excess. Exercise is also important.    Eat all the vegetables you want - avoid meat, especially red meat and dairy - especially cheese.  Cheese is the most concentrated form of trans-fats which is the worst thing to clog up our arteries. Veggie cheese is OK which can be found in the fresh produce section at [employer] or [employer] or [employer]  ++++++++++++++++++++++++++++++++++++++++++++++++++ DASH Eating Plan  DASH stands for "Dietary Approaches to Stop Hypertension."   The DASH eating plan is a healthy eating plan that has been shown to reduce high   blood pressure (hypertension). Additional health benefits may include reducing the risk of type 2 diabetes mellitus, heart disease, and stroke. The DASH eating plan may also help with weight loss.  WHAT DO I NEED TO KNOW ABOUT THE DASH EATING PLAN? For the DASH eating plan, you will follow these general guidelines:  Choose foods with a percent daily value for sodium of less than 5% (as listed on the food  label).  Use salt-free seasonings or herbs instead of table salt or sea salt.  Check with your health care provider or pharmacist before using salt substitutes.  Eat lower-sodium products, often labeled as "lower sodium" or "no salt added."  Eat fresh foods.  Eat more vegetables, fruits, and low-fat dairy products.    Choose whole grains. Look for the word "whole" as the first word in the ingredient list.  Choose fish   Limit sweets, desserts, sugars, and sugary drinks.  Choose heart-healthy fats.  Eat veggie cheese   Eat more home-cooked food and less restaurant, buffet, and fast food.  Limit fried foods.  Cook foods using methods other than frying.  Limit canned vegetables. If you do use them, rinse them well to decrease the sodium.  When eating at a restaurant, ask that your food be prepared with less salt, or no salt if possible.                      WHAT FOODS CAN I EAT?  Seek help from a dietitian for individual calorie needs.  Grains Whole grain or whole wheat bread. Brown rice. Whole grain or whole wheat pasta. Quinoa, bulgur, and whole grain cereals. Low-sodium cereals. Corn or whole wheat flour tortillas. Whole grain cornbread. Whole grain crackers. Low-sodium crackers.  Vegetables Fresh or frozen vegetables (raw, steamed, roasted, or grilled). Low-sodium or reduced-sodium tomato and vegetable juices. Low-sodium or reduced-sodium tomato sauce and paste. Low-sodium or reduced-sodium canned vegetables.   Fruits All fresh, canned (in natural juice), or frozen fruits.  Protein Products  All fish and seafood.  Dried beans, peas, or lentils. Unsalted nuts and seeds. Unsalted canned beans.  Dairy Low-fat dairy products, such as skim or 1% milk, 2% or reduced-fat cheeses, low-fat ricotta or cottage cheese, or plain low-fat yogurt. Low-sodium or reduced-sodium cheeses.  Fats and Oils Tub margarines without trans fats. Light or reduced-fat mayonnaise and salad  dressings (reduced sodium). Avocado. Safflower, olive, or canola oils. Natural peanut or almond butter.  Other Unsalted popcorn and pretzels. The items listed above may not be a complete list of recommended foods or beverages. Contact your dietitian for more options.  +++++++++++++++++++++++++++++++++++++++++++  WHAT FOODS ARE NOT RECOMMENDED?  Grains/ White flour or wheat flour  White bread. White pasta. White rice. Refined cornbread. Bagels and croissants. Crackers that contain trans fat.  Vegetables  Creamed or fried vegetables. Vegetables in a . Regular canned vegetables. Regular canned tomato sauce and paste. Regular tomato and vegetable juices.  Fruits Dried fruits. Canned fruit in light or heavy syrup. Fruit juice.  Meat and Other Protein Products Meat in general. Fatty cuts of meat. Ribs, chicken wings, bacon, sausage, bologna, salami, chitterlings, fatback, hot dogs, bratwurst, and packaged luncheon meats.  Dairy Whole or 2% milk, cream, half-and-half, and cream cheese. Whole-fat or sweetened yogurt. Full-fat cheeses or blue cheese. Nondairy creamers and whipped toppings. Processed cheese, cheese spreads, or cheese curds.  Condiments Onion and garlic salt, seasoned salt, table salt, and sea salt. Canned and packaged gravies. Worcestershire sauce. Tartar sauce.  Barbecue sauce. Teriyaki sauce. Soy sauce, including reduced sodium. Steak sauce. Fish sauce. Oyster sauce. Cocktail sauce. Horseradish. Ketchup and mustard. Meat flavorings and tenderizers. Bouillon cubes. Hot sauce. Tabasco sauce. Marinades. Taco seasonings. Relishes.  Fats and Oils Butter, stick margarine, lard, shortening, ghee, and bacon fat. Coconut, palm kernel, or palm oils. Regular salad dressings.  Pickles and olives. Salted popcorn and pretzels. The items listed above may not be a complete list of foods and beverages to avoid.   Preventive Care for Adults  A healthy lifestyle and preventive care can  promote health and wellness. Preventive health guidelines for women include the following key practices.  A routine yearly physical is a good way to check with your health care provider about your health and preventive screening. It is a chance to share any concerns and updates on your health and to receive a thorough exam.  Visit your dentist for a routine exam and preventive care every 6 months. Brush your teeth twice a day and floss once a day. Good oral hygiene prevents tooth decay and gum disease.  The frequency of eye exams is based on your age, health, family medical history, use of contact lenses, and other factors. Follow your health care provider's recommendations for frequency of eye exams.  Eat a healthy diet. Foods like vegetables, fruits, whole grains, low-fat dairy products, and lean protein foods contain the nutrients you need without too many calories. Decrease your intake of foods high in solid fats, added sugars, and salt. Eat the right amount of calories for you.Get information about a proper diet from your health care provider, if necessary.  Regular physical exercise is one of the most important things you can do for your health. Most adults should get at least 150 minutes of moderate-intensity exercise (any activity that increases your heart rate and causes you to sweat) each week. In addition, most adults need muscle-strengthening exercises on 2 or more days a week.  Maintain a healthy weight. The body mass index (BMI) is a screening tool to identify possible weight problems. It provides an estimate of body fat based on height and weight. Your health care provider can find your BMI and can help you achieve or maintain a healthy weight.For adults 20 years and older:  A BMI below 18.5 is considered underweight.  A BMI of 18.5 to 24.9 is normal.  A BMI of 25 to 29.9 is considered overweight.  A BMI of 30 and above is considered obese.  Maintain normal blood lipids and  cholesterol levels by exercising and minimizing your intake of saturated fat. Eat a balanced diet with plenty of fruit and vegetables. Blood tests for lipids and cholesterol should begin at age 31 and be repeated every 5 years. If your lipid or cholesterol levels are high, you are over 50, or you are at high risk for heart disease, you may need your cholesterol levels checked more frequently.Ongoing high lipid and cholesterol levels should be treated with medicines if diet and exercise are not working.  If you smoke, find out from your health care provider how to quit. If you do not use tobacco, do not start.  Lung cancer screening is recommended for adults aged 83-80 years who are at high risk for developing lung cancer because of a history of smoking. A yearly low-dose CT scan of the lungs is recommended for people who have at least a 30-pack-year history of smoking and are a current smoker or have quit within the past 15 years.  A pack year of smoking is smoking an average of 1 pack of cigarettes a day for 1 year (for example: 1 pack a day for 30 years or 2 packs a day for 15 years). Yearly screening should continue until the smoker has stopped smoking for at least 15 years. Yearly screening should be stopped for people who develop a health problem that would prevent them from having lung cancer treatment.  High blood pressure causes heart disease and increases the risk of stroke. Your blood pressure should be checked at least every 1 to 2 years. Ongoing high blood pressure should be treated with medicines if weight loss and exercise do not work.  If you are 61-30 years old, ask your health care provider if you should take aspirin to prevent strokes.  Diabetes screening involves taking a blood sample to check your fasting blood sugar level. This should be done once every 3 years, after age 9, if you are within normal weight and without risk factors for diabetes. Testing should be considered at a  younger age or be carried out more frequently if you are overweight and have at least 1 risk factor for diabetes.  Breast cancer screening is essential preventive care for women. You should practice "breast self-awareness." This means understanding the normal appearance and feel of your breasts and may include breast self-examination. Any changes detected, no matter how small, should be reported to a health care provider. Women in their 74s and 30s should have a clinical breast exam (CBE) by a health care provider as part of a regular health exam every 1 to 3 years. After age 75, women should have a CBE every year. Starting at age 19, women should consider having a mammogram (breast X-ray test) every year. Women who have a family history of breast cancer should talk to their health care provider about genetic screening. Women at a high risk of breast cancer should talk to their health care providers about having an MRI and a mammogram every year.  Breast cancer gene (BRCA)-related cancer risk assessment is recommended for women who have family members with BRCA-related cancers. BRCA-related cancers include breast, ovarian, tubal, and peritoneal cancers. Having family members with these cancers may be associated with an increased risk for harmful changes (mutations) in the breast cancer genes BRCA1 and BRCA2. Results of the assessment will determine the need for genetic counseling and BRCA1 and BRCA2 testing.  Routine pelvic exams to screen for cancer are no longer recommended for nonpregnant women who are considered low risk for cancer of the pelvic organs (ovaries, uterus, and vagina) and who do not have symptoms. Ask your health care provider if a screening pelvic exam is right for you.  If you have had past treatment for cervical cancer or a condition that could lead to cancer, you need Pap tests and screening for cancer for at least 20 years after your treatment. If Pap tests have been discontinued, your  risk factors (such as having a new sexual partner) need to be reassessed to determine if screening should be resumed. Some women have medical problems that increase the chance of getting cervical cancer. In these cases, your health care provider may recommend more frequent screening and Pap tests.  Colorectal cancer can be detected and often prevented. Most routine colorectal cancer screening begins at the age of 23 years and continues through age 42 years. However, your health care provider may recommend screening at an earlier age if you have risk factors for colon  cancer. On a yearly basis, your health care provider may provide home test kits to check for hidden blood in the stool. Use of a small camera at the end of a tube, to directly examine the colon (sigmoidoscopy or colonoscopy), can detect the earliest forms of colorectal cancer. Talk to your health care provider about this at age 50, when routine screening begins. Direct exam of the colon should be repeated every 5-10 years through age 75 years, unless early forms of pre-cancerous polyps or small growths are found.  Hepatitis C blood testing is recommended for all people born from 1945 through 1965 and any individual with known risks for hepatitis C.  Pra  Osteoporosis is a disease in which the bones lose minerals and strength with aging. This can result in serious bone fractures or breaks. The risk of osteoporosis can be identified using a bone density scan. Women ages 65 years and over and women at risk for fractures or osteoporosis should discuss screening with their health care providers. Ask your health care provider whether you should take a calcium supplement or vitamin D to reduce the rate of osteoporosis.  Menopause can be associated with physical symptoms and risks. Hormone replacement therapy is available to decrease symptoms and risks. You should talk to your health care provider about whether hormone replacement therapy is right  for you.  Use sunscreen. Apply sunscreen liberally and repeatedly throughout the day. You should seek shade when your shadow is shorter than you. Protect yourself by wearing long sleeves, pants, a wide-brimmed hat, and sunglasses year round, whenever you are outdoors.  Once a month, do a whole body skin exam, using a mirror to look at the skin on your back. Tell your health care provider of new moles, moles that have irregular borders, moles that are larger than a pencil eraser, or moles that have changed in shape or color.  Stay current with required vaccines (immunizations).  Influenza vaccine. All adults should be immunized every year.  Tetanus, diphtheria, and acellular pertussis (Td, Tdap) vaccine. Pregnant women should receive 1 dose of Tdap vaccine during each pregnancy. The dose should be obtained regardless of the length of time since the last dose. Immunization is preferred during the 27th-36th week of gestation. An adult who has not previously received Tdap or who does not know her vaccine status should receive 1 dose of Tdap. This initial dose should be followed by tetanus and diphtheria toxoids (Td) booster doses every 10 years. Adults with an unknown or incomplete history of completing a 3-dose immunization series with Td-containing vaccines should begin or complete a primary immunization series including a Tdap dose. Adults should receive a Td booster every 10 years.  Varicella vaccine. An adult without evidence of immunity to varicella should receive 2 doses or a second dose if she has previously received 1 dose. Pregnant females who do not have evidence of immunity should receive the first dose after pregnancy. This first dose should be obtained before leaving the health care facility. The second dose should be obtained 4-8 weeks after the first dose.  Human papillomavirus (HPV) vaccine. Females aged 13-26 years who have not received the vaccine previously should obtain the 3-dose  series. The vaccine is not recommended for use in pregnant females. However, pregnancy testing is not needed before receiving a dose. If a female is found to be pregnant after receiving a dose, no treatment is needed. In that case, the remaining doses should be delayed until after the pregnancy. Immunization   is recommended for any person with an immunocompromised condition through the age of 26 years if she did not get any or all doses earlier. During the 3-dose series, the second dose should be obtained 4-8 weeks after the first dose. The third dose should be obtained 24 weeks after the first dose and 16 weeks after the second dose.  Zoster vaccine. One dose is recommended for adults aged 60 years or older unless certain conditions are present.  Measles, mumps, and rubella (MMR) vaccine. Adults born before 1957 generally are considered immune to measles and mumps. Adults born in 1957 or later should have 1 or more doses of MMR vaccine unless there is a contraindication to the vaccine or there is laboratory evidence of immunity to each of the three diseases. A routine second dose of MMR vaccine should be obtained at least 28 days after the first dose for students attending postsecondary schools, health care workers, or international travelers. People who received inactivated measles vaccine or an unknown type of measles vaccine during 1963-1967 should receive 2 doses of MMR vaccine. People who received inactivated mumps vaccine or an unknown type of mumps vaccine before 1979 and are at high risk for mumps infection should consider immunization with 2 doses of MMR vaccine. For females of childbearing age, rubella immunity should be determined. If there is no evidence of immunity, females who are not pregnant should be vaccinated. If there is no evidence of immunity, females who are pregnant should delay immunization until after pregnancy. Unvaccinated health care workers born before 1957 who lack laboratory  evidence of measles, mumps, or rubella immunity or laboratory confirmation of disease should consider measles and mumps immunization with 2 doses of MMR vaccine or rubella immunization with 1 dose of MMR vaccine.  Pneumococcal 13-valent conjugate (PCV13) vaccine. When indicated, a person who is uncertain of her immunization history and has no record of immunization should receive the PCV13 vaccine. An adult aged 19 years or older who has certain medical conditions and has not been previously immunized should receive 1 dose of PCV13 vaccine. This PCV13 should be followed with a dose of pneumococcal polysaccharide (PPSV23) vaccine. The PPSV23 vaccine dose should be obtained at least 8 weeks after the dose of PCV13 vaccine. An adult aged 19 years or older who has certain medical conditions and previously received 1 or more doses of PPSV23 vaccine should receive 1 dose of PCV13. The PCV13 vaccine dose should be obtained 1 or more years after the last PPSV23 vaccine dose.    Pneumococcal polysaccharide (PPSV23) vaccine. When PCV13 is also indicated, PCV13 should be obtained first. All adults aged 65 years and older should be immunized. An adult younger than age 65 years who has certain medical conditions should be immunized. Any person who resides in a nursing home or long-term care facility should be immunized. An adult smoker should be immunized. People with an immunocompromised condition and certain other conditions should receive both PCV13 and PPSV23 vaccines. People with human immunodeficiency virus (HIV) infection should be immunized as soon as possible after diagnosis. Immunization during chemotherapy or radiation therapy should be avoided. Routine use of PPSV23 vaccine is not recommended for American Indians, [place] Natives, or people younger than 65 years unless there are medical conditions that require PPSV23 vaccine. When indicated, people who have unknown immunization and have no record of immunization  should receive PPSV23 vaccine. One-time revaccination 5 years after the first dose of PPSV23 is recommended for people aged 19-64 years who have   chronic kidney failure, nephrotic syndrome, asplenia, or immunocompromised conditions. People who received 1-2 doses of PPSV23 before age 28 years should receive another dose of PPSV23 vaccine at age 64 years or later if at least 5 years have passed since the previous dose. Doses of PPSV23 are not needed for people immunized with PPSV23 at or after age 65 years.  Preventive Services / Frequency   Ages 52 to 76 years  Blood pressure check.  Lipid and cholesterol check.  Lung cancer screening. / Every year if you are aged 62-80 years and have a 30-pack-year history of smoking and currently smoke or have quit within the past 15 years. Yearly screening is stopped once you have quit smoking for at least 15 years or develop a health problem that would prevent you from having lung cancer treatment.  Clinical breast exam.** / Every year after age 38 years.  BRCA-related cancer risk assessment.** / For women who have family members with a BRCA-related cancer (breast, ovarian, tubal, or peritoneal cancers).  Mammogram.** / Every year beginning at age 69 years and continuing for as long as you are in good health. Consult with your health care provider.  Pap test.** / Every 3 years starting at age 29 years through age 47 or 54 years with a history of 3 consecutive normal Pap tests.  HPV screening.** / Every 3 years from ages 1 years through ages 108 to 42 years with a history of 3 consecutive normal Pap tests.  Fecal occult blood test (FOBT) of stool. / Every year beginning at age 36 years and continuing until age 71 years. You may not need to do this test if you get a colonoscopy every 10 years.  Flexible sigmoidoscopy or colonoscopy.** / Every 5 years for a flexible sigmoidoscopy or every 10 years for a colonoscopy beginning at age 65 years and continuing  until age 21 years.  Hepatitis C blood test.** / For all people born from [birth year] through 1965 and any individual with known risks for hepatitis C.  Skin self-exam. / Monthly.  Influenza vaccine. / Every year.  Tetanus, diphtheria, and acellular pertussis (Tdap/Td) vaccine.** / Consult your health care provider. Pregnant women should receive 1 dose of Tdap vaccine during each pregnancy. 1 dose of Td every 10 years.  Varicella vaccine.** / Consult your health care provider. Pregnant females who do not have evidence of immunity should receive the first dose after pregnancy.  Zoster vaccine.** / 1 dose for adults aged 69 years or older.  Pneumococcal 13-valent conjugate (PCV13) vaccine.** / Consult your health care provider.  Pneumococcal polysaccharide (PPSV23) vaccine.** / 1 to 2 doses if you smoke cigarettes or if you have certain conditions.  Meningococcal vaccine.** / Consult your health care provider.  Hepatitis A vaccine.** / Consult your health care provider.  Hepatitis B vaccine.** / Consult your health care provider. Screening for abdominal aortic aneurysm (AAA)  by ultrasound is recommended for people over 50 who have history of high blood pressure or who are current or former smokers.

## 2015-10-12 LAB — MICROALBUMIN / CREATININE URINE RATIO
Creatinine, Urine: 19 mg/dL — ABNORMAL LOW (ref 20–320)
Microalb, Ur: <0.2 mg/dL

## 2015-10-12 LAB — URINALYSIS, ROUTINE W REFLEX MICROSCOPIC
BILIRUBIN URINE: NEGATIVE
GLUCOSE, UA: NEGATIVE
HGB URINE DIPSTICK: NEGATIVE
KETONES UR: NEGATIVE
Leukocytes, UA: NEGATIVE
Nitrite: NEGATIVE
PH: 6 (ref 5.0–8.0)
Protein, ur: NEGATIVE
Specific Gravity, Urine: 1.007 (ref 1.001–1.035)

## 2015-10-12 LAB — VITAMIN D 25 HYDROXY (VIT D DEFICIENCY, FRACTURES): Vit D, 25-Hydroxy: 59 ng/mL (ref 30–100)

## 2015-10-12 LAB — INSULIN, RANDOM: Insulin: 91.6 u[IU]/mL — ABNORMAL HIGH (ref 2.0–19.6)

## 2015-10-13 ENCOUNTER — Other Ambulatory Visit: Payer: Self-pay | Admitting: Internal Medicine

## 2015-11-15 ENCOUNTER — Other Ambulatory Visit: Payer: Self-pay | Admitting: Physician Assistant

## 2015-11-15 DIAGNOSIS — G4700 Insomnia, unspecified: Secondary | ICD-10-CM

## 2016-01-12 DIAGNOSIS — H26492 Other secondary cataract, left eye: Secondary | ICD-10-CM | POA: Diagnosis not present

## 2016-01-12 DIAGNOSIS — H04123 Dry eye syndrome of bilateral lacrimal glands: Secondary | ICD-10-CM | POA: Diagnosis not present

## 2016-01-12 DIAGNOSIS — H2511 Age-related nuclear cataract, right eye: Secondary | ICD-10-CM | POA: Diagnosis not present

## 2016-01-14 ENCOUNTER — Encounter: Payer: Self-pay | Admitting: Internal Medicine

## 2016-01-14 ENCOUNTER — Ambulatory Visit (INDEPENDENT_AMBULATORY_CARE_PROVIDER_SITE_OTHER): Payer: Medicare Other | Admitting: Internal Medicine

## 2016-01-14 VITALS — BP 140/90 | HR 74 | Temp 98.2°F | Resp 16 | Ht 63.0 in | Wt 167.0 lb

## 2016-01-14 DIAGNOSIS — E559 Vitamin D deficiency, unspecified: Secondary | ICD-10-CM

## 2016-01-14 DIAGNOSIS — R7309 Other abnormal glucose: Secondary | ICD-10-CM | POA: Diagnosis not present

## 2016-01-14 DIAGNOSIS — E782 Mixed hyperlipidemia: Secondary | ICD-10-CM

## 2016-01-14 DIAGNOSIS — I10 Essential (primary) hypertension: Secondary | ICD-10-CM | POA: Diagnosis not present

## 2016-01-14 DIAGNOSIS — Z79899 Other long term (current) drug therapy: Secondary | ICD-10-CM | POA: Diagnosis not present

## 2016-01-14 DIAGNOSIS — R7303 Prediabetes: Secondary | ICD-10-CM | POA: Diagnosis not present

## 2016-01-14 LAB — TSH: TSH: 3.77 m[IU]/L

## 2016-01-14 LAB — BASIC METABOLIC PANEL WITH GFR
BUN: 9 mg/dL (ref 7–25)
CALCIUM: 9.1 mg/dL (ref 8.6–10.4)
CO2: 31 mmol/L (ref 20–31)
CREATININE: 0.61 mg/dL (ref 0.50–1.05)
Chloride: 103 mmol/L (ref 98–110)
GFR, Est African American: >89 mL/min (ref >=60)
GLUCOSE: 83 mg/dL (ref 65–99)
Potassium: 4 mmol/L (ref 3.5–5.3)
Sodium: 143 mmol/L (ref 135–146)

## 2016-01-14 LAB — CBC WITH DIFFERENTIAL/PLATELET
Basophils Absolute: 0 10*3/uL (ref 0.0–0.1)
Basophils Relative: 0 % (ref 0–1)
Eosinophils Absolute: 0.1 10*3/uL (ref 0.0–0.7)
Eosinophils Relative: 1 % (ref 0–5)
HEMATOCRIT: 41.9 % (ref 36.0–46.0)
HEMOGLOBIN: 13.8 g/dL (ref 12.0–15.0)
LYMPHS ABS: 4.1 10*3/uL — AB (ref 0.7–4.0)
LYMPHS PCT: 45 % (ref 12–46)
MCH: 27.4 pg (ref 26.0–34.0)
MCHC: 32.9 g/dL (ref 30.0–36.0)
MCV: 83.3 fL (ref 78.0–100.0)
MONO ABS: 0.5 10*3/uL (ref 0.1–1.0)
MONOS PCT: 5 % (ref 3–12)
MPV: 9.6 fL (ref 8.6–12.4)
NEUTROS ABS: 4.5 10*3/uL (ref 1.7–7.7)
Neutrophils Relative %: 49 % (ref 43–77)
Platelets: 335 10*3/uL (ref 150–400)
RBC: 5.03 MIL/uL (ref 3.87–5.11)
RDW: 14.5 % (ref 11.5–15.5)
WBC: 9.1 10*3/uL (ref 4.0–10.5)

## 2016-01-14 LAB — HEPATIC FUNCTION PANEL
ALT: 18 U/L (ref 6–29)
AST: 20 U/L (ref 10–35)
Albumin: 4.1 g/dL (ref 3.6–5.1)
Alkaline Phosphatase: 69 U/L (ref 33–130)
BILIRUBIN INDIRECT: 0.2 mg/dL (ref 0.2–1.2)
Bilirubin, Direct: 0.1 mg/dL (ref <=0.2)
TOTAL PROTEIN: 6.5 g/dL (ref 6.1–8.1)
Total Bilirubin: 0.3 mg/dL (ref 0.2–1.2)

## 2016-01-14 LAB — LIPID PANEL
CHOL/HDL RATIO: 4.2 ratio (ref <=5.0)
Cholesterol: 229 mg/dL — ABNORMAL HIGH (ref 125–200)
HDL: 54 mg/dL (ref >=46)
LDL CALC: 151 mg/dL — AB (ref <130)
TRIGLYCERIDES: 120 mg/dL (ref <150)
VLDL: 24 mg/dL (ref <30)

## 2016-01-14 LAB — HEMOGLOBIN A1C
Hgb A1c MFr Bld: 5.8 % — ABNORMAL HIGH (ref <5.7)
Mean Plasma Glucose: 120 mg/dL — ABNORMAL HIGH (ref <117)

## 2016-01-14 MED ORDER — HYOSCYAMINE SULFATE 0.125 MG SL SUBL: SUBLINGUAL_TABLET | SUBLINGUAL | Status: DC

## 2016-01-14 MED ORDER — ROSUVASTATIN CALCIUM 40 MG PO TABS: 40.0000 mg | ORAL_TABLET | Freq: Every day | ORAL | Status: DC

## 2016-01-14 NOTE — Progress Notes (Signed)
Patient ID: Desiree Anderson, female   DOB: 1956/03/29, 61 y.o.   MRN: [id number]  Assessment and Plan:  Hypertension:  -Continue medication,  -monitor blood pressure at home.  -Continue DASH diet.   -Reminder to go to the ER if any CP, SOB, nausea, dizziness, severe HA, changes vision/speech, left arm numbness and tingling, and jaw pain.  Cholesterol: -Continue diet and exercise.  -Check cholesterol.   Pre-diabetes: -Continue diet and exercise.  -Check A1C  Vitamin D Def: -check level -continue medications.   Abdominal pain -bentyl called in  Continue diet and meds as discussed. Further disposition pending results of labs.  HPI 60 y.o. female  presents for 3 month follow up with hypertension, hyperlipidemia, prediabetes and vitamin D.   Her blood pressure has been controlled at home, today their BP is BP: (!) 142/96 mmHg.   She does not workout. She denies chest pain, shortness of breath, dizziness.  She feels like the pain in her bladder has been an issue.     She is on cholesterol medication and denies myalgias. Her cholesterol is at goal. The cholesterol last visit was:   Lab Results  Component Value Date   CHOL 156 10/11/2015   HDL 52 10/11/2015   LDLCALC 75 10/11/2015   TRIG 143 10/11/2015   CHOLHDL 3.0 10/11/2015     She has been working on diet and exercise for prediabetes, and denies foot ulcerations, hyperglycemia, hypoglycemia , increased appetite, nausea, paresthesia of the feet, polydipsia, polyuria, visual disturbances, vomiting and weight loss. Last A1C in the office was:  Lab Results  Component Value Date   HGBA1C 5.6 10/11/2015    Patient is on Vitamin D supplement.  Lab Results  Component Value Date   VD25OH 85 10/11/2015     She reports that she is having abdominal pain and cramping which started on Wednesday night.  She was having diarrhea.  She is having loose stools currently.  She is having around 4-6 bowel movements per day.  She does not tend to  eat greasy or fried food usually.  No blood in stools no black colored stools.  She is taking ranitidine twice daily.    She does feel like she has been very irritable at home.  She finds that she is snapping a lot.  She reports that her food intake has decreased.  She feels like she is full more often.  She also reports that she is sleeping okay.     Current Medications:  Current Outpatient Prescriptions on File Prior to Visit  Medication Sig Dispense Refill  . ALPRAZolam (XANAX) 0.5 MG tablet TAKE ONE TABLET BY MOUTH THREE TIMES DAILY AS NEEDED 90 tablet 0  . B Complex-C (SUPER B COMPLEX PO) Take 1 tablet by mouth daily.     . cetirizine (ZYRTEC) 10 MG tablet Take 10 mg by mouth daily as needed for allergies.    . Cholecalciferol (VITAMIN D-3) 5000 UNITS TABS Take 2 tablets by mouth daily.     . CRESTOR 40 MG tablet TAKE ONE-HALF TO ONE TABLET BY MOUTH ONCE DAILY OR AS DIRECTED FOR CHOLESTEROL 30 tablet 3  . fluticasone (FLONASE) 50 MCG/ACT nasal spray Place 2 sprays into both nostrils daily at 12 noon.    Marland Kitchen guaiFENesin (MUCINEX) 600 MG 12 hr tablet Take 600 mg by mouth 2 (two) times daily as needed.    . hydrochlorothiazide (HYDRODIURIL) 25 MG tablet TAKE ONE TABLET BY MOUTH ONCE DAILY 90 tablet 0  . KLOR-CON M20  20 MEQ tablet TAKE ONE TABLET BY MOUTH TWICE DAILY 60 tablet 5  . levothyroxine (SYNTHROID, LEVOTHROID) 88 MCG tablet TAKE ONE TABLET BY MOUTH ONCE DAILY BEFORE BREAKFAST 90 tablet 2  . Multiple Vitamins-Minerals (GERITABS PO) Take 1 tablet by mouth daily.    . ranitidine (ZANTAC) 300 MG tablet TAKE ONE TABLET BY MOUTH TWICE DAILY 180 tablet 3  . terbinafine (LAMISIL) 250 MG tablet Take 1 tablet (250 mg total) by mouth daily. 90 tablet 0  . triazolam (HALCION) 0.25 MG tablet TAKE ONE TABLET BY MOUTH AT BEDTIME 30 tablet 5   No current facility-administered medications on file prior to visit.    Medical History:  Past Medical History  Diagnosis Date  . Hypertension   .  Fibromyalgia   . Hypothyroidism   . GERD (gastroesophageal reflux disease)   . DDD (degenerative disc disease), lumbar   . Anxiety   . Depression   . Erosion of vaginal mesh ([place])   . Rectocele   . History of gastric ulcer     Allergies:  Allergies  Allergen Reactions  . Lipitor [Atorvastatin]     Muscle and legs aches  . Shellfish Allergy Diarrhea and Nausea And Vomiting     Review of Systems:  Review of Systems  Constitutional: Negative for fever, chills and malaise/fatigue.  HENT: Negative for congestion, ear pain and sore throat.   Eyes: Negative.   Respiratory: Negative for cough, shortness of breath and wheezing.   Cardiovascular: Negative for chest pain, palpitations and leg swelling.  Gastrointestinal: Positive for abdominal pain and diarrhea. Negative for heartburn, constipation, blood in stool and melena.  Genitourinary: Negative for dysuria, urgency, frequency and hematuria.  Musculoskeletal: Positive for back pain.  Skin: Negative.   Neurological: Negative for dizziness, sensory change, loss of consciousness and headaches.    Family history- Review and unchanged  Social history- Review and unchanged  Physical Exam: BP 142/96 mmHg  Pulse 74  Temp(Src) 98.2 F (36.8 C) (Temporal)  Resp 16  Ht 5\' 3"  (1.6 m)  Wt 167 lb (75.751 kg)  BMI 29.59 kg/m2 Wt Readings from Last 3 Encounters:  01/14/16 167 lb (75.751 kg)  10/11/15 166 lb (75.297 kg)  08/11/15 166 lb 9.6 oz (75.569 kg)    General Appearance: Well nourished well developed, in no apparent distress. Eyes: PERRLA, EOMs, conjunctiva no swelling or erythema ENT/Mouth: Ear canals normal without obstruction, swelling, erythma, discharge.  TMs normal bilaterally.  Oropharynx moist, clear, without exudate, or postoropharyngeal swelling. Neck: Supple, thyroid normal,no cervical adenopathy  Respiratory: Respiratory effort normal, Breath sounds clear A&P without rhonchi, wheeze, or rale.  No retractions, no  accessory usage. Cardio: RRR with no MRGs. Brisk peripheral pulses without edema.  Abdomen: Soft, + BS,  Non tender, no guarding, rebound, hernias, masses. Musculoskeletal: Full ROM, 5/5 strength, Normal gait Skin: Warm, dry without rashes, lesions, ecchymosis.  Neuro: Awake and oriented X 3, Cranial nerves intact. Normal muscle tone, no cerebellar symptoms. Psych: Normal affect, Insight and Judgment appropriate.     Starlyn Skeans, PA-C 10:45 AM [facility]

## 2016-02-02 ENCOUNTER — Other Ambulatory Visit: Payer: Self-pay | Admitting: Internal Medicine

## 2016-02-05 DIAGNOSIS — H10013 Acute follicular conjunctivitis, bilateral: Secondary | ICD-10-CM | POA: Diagnosis not present

## 2016-02-21 DIAGNOSIS — H10013 Acute follicular conjunctivitis, bilateral: Secondary | ICD-10-CM | POA: Diagnosis not present

## 2016-03-26 ENCOUNTER — Other Ambulatory Visit: Payer: Self-pay | Admitting: Internal Medicine

## 2016-04-10 ENCOUNTER — Other Ambulatory Visit: Payer: Self-pay | Admitting: Obstetrics and Gynecology

## 2016-04-10 DIAGNOSIS — Z124 Encounter for screening for malignant neoplasm of cervix: Secondary | ICD-10-CM | POA: Diagnosis not present

## 2016-04-10 DIAGNOSIS — Z01419 Encounter for gynecological examination (general) (routine) without abnormal findings: Secondary | ICD-10-CM | POA: Diagnosis not present

## 2016-04-10 DIAGNOSIS — Z1231 Encounter for screening mammogram for malignant neoplasm of breast: Secondary | ICD-10-CM | POA: Diagnosis not present

## 2016-04-11 LAB — CYTOLOGY - PAP

## 2016-04-12 ENCOUNTER — Other Ambulatory Visit: Payer: Self-pay | Admitting: Obstetrics and Gynecology

## 2016-04-12 DIAGNOSIS — R928 Other abnormal and inconclusive findings on diagnostic imaging of breast: Secondary | ICD-10-CM

## 2016-04-18 ENCOUNTER — Ambulatory Visit
Admission: RE | Admit: 2016-04-18 | Discharge: 2016-04-18 | Disposition: A | Discharge status: Home / Self Care | Payer: Medicare Other | Source: Ambulatory Visit | Attending: Obstetrics and Gynecology | Admitting: Obstetrics and Gynecology

## 2016-04-18 ENCOUNTER — Other Ambulatory Visit: Payer: Self-pay | Admitting: Obstetrics and Gynecology

## 2016-04-18 ENCOUNTER — Encounter: Payer: Self-pay | Admitting: Internal Medicine

## 2016-04-18 ENCOUNTER — Ambulatory Visit (INDEPENDENT_AMBULATORY_CARE_PROVIDER_SITE_OTHER): Payer: Medicare Other | Admitting: Internal Medicine

## 2016-04-18 ENCOUNTER — Other Ambulatory Visit: Payer: Self-pay | Admitting: Internal Medicine

## 2016-04-18 VITALS — BP 146/94 | HR 86 | Temp 98.0°F | Resp 16 | Ht 63.0 in | Wt 165.0 lb

## 2016-04-18 DIAGNOSIS — R7303 Prediabetes: Secondary | ICD-10-CM | POA: Diagnosis not present

## 2016-04-18 DIAGNOSIS — Z79899 Other long term (current) drug therapy: Secondary | ICD-10-CM

## 2016-04-18 DIAGNOSIS — E782 Mixed hyperlipidemia: Secondary | ICD-10-CM

## 2016-04-18 DIAGNOSIS — N6310 Unspecified lump in the right breast, unspecified quadrant: Secondary | ICD-10-CM

## 2016-04-18 DIAGNOSIS — E039 Hypothyroidism, unspecified: Secondary | ICD-10-CM | POA: Diagnosis not present

## 2016-04-18 DIAGNOSIS — R7309 Other abnormal glucose: Secondary | ICD-10-CM | POA: Diagnosis not present

## 2016-04-18 DIAGNOSIS — R928 Other abnormal and inconclusive findings on diagnostic imaging of breast: Secondary | ICD-10-CM

## 2016-04-18 DIAGNOSIS — E559 Vitamin D deficiency, unspecified: Secondary | ICD-10-CM

## 2016-04-18 DIAGNOSIS — R922 Inconclusive mammogram: Secondary | ICD-10-CM | POA: Diagnosis not present

## 2016-04-18 DIAGNOSIS — M5136 Other intervertebral disc degeneration, lumbar region: Secondary | ICD-10-CM | POA: Diagnosis not present

## 2016-04-18 DIAGNOSIS — I10 Essential (primary) hypertension: Secondary | ICD-10-CM

## 2016-04-18 DIAGNOSIS — M797 Fibromyalgia: Secondary | ICD-10-CM | POA: Diagnosis not present

## 2016-04-18 DIAGNOSIS — N63 Unspecified lump in breast: Secondary | ICD-10-CM | POA: Diagnosis not present

## 2016-04-18 DIAGNOSIS — C50411 Malignant neoplasm of upper-outer quadrant of right female breast: Secondary | ICD-10-CM | POA: Diagnosis not present

## 2016-04-18 HISTORY — PX: BREAST BIOPSY: SHX20

## 2016-04-18 LAB — LIPID PANEL
CHOL/HDL RATIO: 4.8 ratio (ref <=5.0)
Cholesterol: 276 mg/dL — ABNORMAL HIGH (ref 125–200)
HDL: 58 mg/dL (ref >=46)
LDL CALC: 185 mg/dL — AB (ref <130)
Triglycerides: 163 mg/dL — ABNORMAL HIGH (ref <150)
VLDL: 33 mg/dL — AB (ref <30)

## 2016-04-18 LAB — CBC WITH DIFFERENTIAL/PLATELET
BASOS PCT: 0 %
Basophils Absolute: 0 cells/uL (ref 0–200)
Eosinophils Absolute: 103 cells/uL (ref 15–500)
Eosinophils Relative: 1 %
HEMATOCRIT: 42 % (ref 35.0–45.0)
Hemoglobin: 14 g/dL (ref 11.7–15.5)
LYMPHS PCT: 38 %
Lymphs Abs: 3914 cells/uL — ABNORMAL HIGH (ref 850–3900)
MCH: 27.5 pg (ref 27.0–33.0)
MCHC: 33.3 g/dL (ref 32.0–36.0)
MCV: 82.5 fL (ref 80.0–100.0)
MONO ABS: 618 {cells}/uL (ref 200–950)
MPV: 9.9 fL (ref 7.5–12.5)
Monocytes Relative: 6 %
Neutro Abs: 5665 cells/uL (ref 1500–7800)
Neutrophils Relative %: 55 %
Platelets: 332 10*3/uL (ref 140–400)
RBC: 5.09 MIL/uL (ref 3.80–5.10)
RDW: 14.1 % (ref 11.0–15.0)
WBC: 10.3 10*3/uL (ref 3.8–10.8)

## 2016-04-18 LAB — HEPATIC FUNCTION PANEL
ALK PHOS: 83 U/L (ref 33–130)
ALT: 18 U/L (ref 6–29)
AST: 17 U/L (ref 10–35)
Albumin: 4.3 g/dL (ref 3.6–5.1)
BILIRUBIN INDIRECT: 0.3 mg/dL (ref 0.2–1.2)
BILIRUBIN TOTAL: 0.4 mg/dL (ref 0.2–1.2)
Bilirubin, Direct: 0.1 mg/dL (ref <=0.2)
TOTAL PROTEIN: 6.7 g/dL (ref 6.1–8.1)

## 2016-04-18 LAB — BASIC METABOLIC PANEL WITH GFR
BUN: 8 mg/dL (ref 7–25)
CO2: 30 mmol/L (ref 20–31)
Calcium: 9.6 mg/dL (ref 8.6–10.4)
Chloride: 100 mmol/L (ref 98–110)
Creat: 0.65 mg/dL (ref 0.50–1.05)
GLUCOSE: 84 mg/dL (ref 65–99)
POTASSIUM: 3.8 mmol/L (ref 3.5–5.3)
Sodium: 141 mmol/L (ref 135–146)

## 2016-04-18 LAB — HEMOGLOBIN A1C
Hgb A1c MFr Bld: 5.8 % — ABNORMAL HIGH (ref <5.7)
Mean Plasma Glucose: 120 mg/dL

## 2016-04-18 LAB — TSH: TSH: 13.61 mIU/L — ABNORMAL HIGH

## 2016-04-18 LAB — MAGNESIUM: MAGNESIUM: 2 mg/dL (ref 1.5–2.5)

## 2016-04-18 NOTE — Progress Notes (Signed)
Patient ID: Desiree Anderson, female   DOB: 19-Jul-1956, 60 y.o.   MRN: [id number]  [facility]                       Unk Pinto, M.D.        Uvaldo Bristle. Silverio Lay, P.A.-C       Starlyn Skeans, P.A.-C   [facility]                [address] [place] [id number] Telephone [phone] Telefax [phone] _________________________________________________________________________   This very nice 60 y.o. MWF presents for 6 month follow up with Hypertension, Hyperlipidemia, Pre-Diabetes and Vitamin D Deficiency.    Patient is treated for HTN circa 2004 & BP has been controlled at home. Today's BP is elevated at 146/94 and rechecked at 138/88. Marland Kitchen Patient has had no complaints of any cardiac type chest pain, palpitations, dyspnea/orthopnea/PND, dizziness, claudication, or dependent edema.   Hyperlipidemia is not controlled with diet & meds. Patient denies myalgias or other med SE's. Last Lipids were not at goal with  Cholesterol 229*; HDL 54; LDL 151*; Triglycerides 120 on 01/14/2016.   Also, the patient has history of PreDiabetes with A1c 6.1% in 2011 and has had no symptoms of reactive hypoglycemia, diabetic polys, paresthesias or visual blurring.  Last A1c was improved at 5.8% on 01/14/2016.   Further, the patient also has history of Vitamin D Deficiency of "11" in 2008 and supplements vitamin D without any suspected side-effects. Last vitamin D was 59 on 10/11/2015.  Medication Sig  . ALPRAZolam  0.5 MG tablet TAKE ONE TABLET BY MOUTH THREE TIMES DAILY AS NEEDED  . SUPER B COMPLEX Take 1 tablet by mouth daily.   . cetirizine 10 MG tablet Take 10 mg by mouth daily as needed for allergies.  Marland Kitchen VITAMIN D 5000 UNITS  Take 2 tablets by mouth daily.   Marland Kitchen FLONASE nasal spray Place 2 sprays into both nostrils daily at 12 noon.  Marland Kitchen guaiFENesin (MUCINEX) 600 MG 12 hr tablet Take 600 mg by mouth 2 (two) times daily as  needed.  . hctz 25 MG tablet TAKE ONE TABLET BY MOUTH ONCE DAILY  . Hyoscyamine-SL 0.125 MG SL tablet Take 1 to 2 tablets 3 to 4 x day if needed for Nausea, vomiting, cramping or diarrhea  . KLOR-CON 20 MEQ tablet TAKE ONE TABLET BY MOUTH TWICE DAILY  . levothyroxine  88 MCG tablet TAKE ONE TABLET BY MOUTH ONCE DAILY BEFORE BREAKFAST  . Multiple Vitamins-Minerals  Take 1 tablet by mouth daily.  . ranitidine  300 MG tablet TAKE ONE TABLET BY MOUTH TWICE DAILY  . rosuvastatin 40 MG tablet Take 1 tablet (40 mg total) by mouth daily.  Marland Kitchen terbinafine (LAMISIL) 250 MG tablet Take 1 tablet (250 mg total) by mouth daily.  . triazolam (HALCION) 0.25 MG tablet TAKE ONE TABLET BY MOUTH AT BEDTIME   Allergies  Allergen Reactions  . Lipitor [Atorvastatin]     Muscle and legs aches  . Shellfish Allergy Diarrhea and Nausea And Vomiting   PMHx:   Past Medical History  Diagnosis Date  . Hypertension   . Fibromyalgia   . Hypothyroidism   . GERD (gastroesophageal reflux disease)   . DDD (degenerative disc disease), lumbar   . Anxiety   . Depression   . Erosion of vaginal  mesh ([place])   . Rectocele   . History of gastric ulcer    Immunization History  Administered Date(s) Administered  . Influenza Split 09/18/2013, 09/25/2014  . Influenza, Seasonal, Injecte, Preservative Fre 10/11/2015  . Pneumococcal-Unspecified 11/27/2000  . Tdap 03/16/2011   Past Surgical History  Procedure Laterality Date  . Incontinence surgery  2009  &  2005  . Bladder tack  2003  . Breast lumpectomy  1985  . Vein bypass surgery  AGE 78    RIGHT LEG  . Transthoracic echocardiogram  06-16-2013    GRADE I DIASTOLIC DYSFUNCTION/  EF 55-60%/  MILD AR/  TRIVIAL MR  &  TR  . Cardiovascular stress test  07-02-2013  DR [facility]    LOW RISK NUCLEAR STUDY WITH A SMALL, MILD APICAL SEPTAL FIXED PERFUSION DEFECT .  GIVEN NORMAL WALL FUNCTION , SUSPECT REPRESENTS ATTENUATION/  EF 74%/  NO ISCHEMIA  . Cysto/  hydrodistention/   bladder bx/   instillation therapy  08-29-2010  . Vaginal hysterectomy  2000    w/  unilateral salpingoophorectomy  . Cataract extraction w/ intraocular lens implant Left 01/2014  . Excision of mesh N/A 03/23/2014    Procedure: EXCISION OF VAGINAL AND PERI-URETHRAL MESH, EXTRACTION FROM URETHRA TO APEX, insertion of XENFORM in the vaginal vault;  Surgeon: Ailene Rud, MD;  Location: [facility];  Service: Urology;  Laterality: N/A;  . Excision vaginal cyst N/A 04/06/2014    Procedure: VAGINAL SPECULUM EXAMINATION/POSSIBLE DRAINAGE OF HEMATOMA;  Surgeon: Ailene Rud, MD;  Location: [facility];  Service: Urology;  Laterality: N/A;   FHx:    Reviewed / unchanged  SHx:    Reviewed / unchanged  Systems Review:  Constitutional: Denies fever, chills, wt changes, headaches, insomnia, fatigue, night sweats, change in appetite. Eyes: Denies redness, blurred vision, diplopia, discharge, itchy, watery eyes.  ENT: Denies discharge, congestion, post nasal drip, epistaxis, sore throat, earache, hearing loss, dental pain, tinnitus, vertigo, sinus pain, snoring.  CV: Denies chest pain, palpitations, irregular heartbeat, syncope, dyspnea, diaphoresis, orthopnea, PND, claudication or edema. Respiratory: denies cough, dyspnea, DOE, pleurisy, hoarseness, laryngitis, wheezing.  Gastrointestinal: Denies dysphagia, odynophagia, heartburn, reflux, water brash, abdominal pain or cramps, nausea, vomiting, bloating, diarrhea, constipation, hematemesis, melena, hematochezia  or hemorrhoids. Genitourinary: Denies dysuria, frequency, urgency, nocturia, hesitancy, discharge, hematuria or flank pain. Musculoskeletal: Denies arthralgias, myalgias, stiffness, jt. swelling, pain, limping or strain/sprain.  Skin: Denies pruritus, rash, hives, warts, acne, eczema or change in skin lesion(s). Neuro: No weakness, tremor, incoordination, spasms, paresthesia or pain. Psychiatric: Denies  confusion, memory loss or sensory loss. Endo: Denies change in weight, skin or hair change.  Heme/Lymph: No excessive bleeding, bruising or enlarged lymph nodes.  Physical Exam  BP 146/94  & rechecked at 138/88    Pulse 86  Temp 98 F (  Resp 16  Ht 5\' 3"         Wt 165 lb    BMI 29.24 kg/m2  Appears well nourished and in no distress. Eyes: PERRLA, EOMs, conjunctiva no swelling or erythema. Sinuses: No frontal/maxillary tenderness ENT/Mouth: EAC's clear, TM's nl w/o erythema, bulging. Nares clear w/o erythema, swelling, exudates. Oropharynx clear without erythema or exudates. Oral hygiene is good. Tongue normal, non obstructing. Hearing intact.  Neck: Supple. Thyroid nl. Car 2+/2+ without bruits, nodes or JVD. Chest: Respirations nl with BS clear & equal w/o rales, rhonchi, wheezing or stridor.  Cor: Heart sounds normal w/ regular rate and rhythm without sig. murmurs, gallops,  clicks, or rubs. Peripheral pulses normal and equal  without edema.  Abdomen: Soft & bowel sounds normal. Non-tender w/o guarding, rebound, hernias, masses, or organomegaly.  Lymphatics: Unremarkable.  Musculoskeletal: Full ROM all peripheral extremities, joint stability, 5/5 strength, and normal gait.  Skin: Warm, dry without exposed rashes, lesions or ecchymosis apparent.  Neuro: Cranial nerves intact, reflexes equal bilaterally. Sensory-motor testing grossly intact. Tendon reflexes grossly intact.  Pysch: Alert & oriented x 3.  Insight and judgement nl & appropriate. No ideations.  Assessment and Plan:  1. Essential hypertension  - TSH  2. Mixed hyperlipidemia  - Lipid panel - Magnesium - TSH  3. Prediabetes  - Hemoglobin A1c - Insulin, random  4. Vitamin D deficiency  - VITAMIN D 25 Hydroxy   5. Hypothyroidism   6. Fibromyalgia Syndrome   7. DDD, lumbar   8. Medication management  - CBC with Differential/Platelet - BASIC METABOLIC PANEL WITH GFR - Hepatic function  panel   Recommended regular exercise, BP monitoring, weight control, and discussed med and SE's. Recommended labs to assess and monitor clinical status. Further disposition pending results of labs. Over 30 minutes of exam, counseling, chart review was performed

## 2016-04-18 NOTE — Patient Instructions (Signed)

## 2016-04-19 LAB — INSULIN, RANDOM: Insulin: 17.9 u[IU]/mL (ref 2.0–19.6)

## 2016-04-19 LAB — VITAMIN D 25 HYDROXY (VIT D DEFICIENCY, FRACTURES): Vit D, 25-Hydroxy: 66 ng/mL (ref 30–100)

## 2016-04-20 ENCOUNTER — Telehealth: Payer: Self-pay | Admitting: *Deleted

## 2016-04-20 NOTE — Telephone Encounter (Signed)
Pt aware of lab results 

## 2016-04-21 ENCOUNTER — Telehealth: Payer: Self-pay | Admitting: *Deleted

## 2016-04-21 DIAGNOSIS — C50411 Malignant neoplasm of upper-outer quadrant of right female breast: Secondary | ICD-10-CM

## 2016-04-21 DIAGNOSIS — Z170 Estrogen receptor positive status [ER+]: Secondary | ICD-10-CM | POA: Insufficient documentation

## 2016-04-21 NOTE — Telephone Encounter (Signed)
Confirmed [facility] for 04/26/16 at 1215pm .  Instructions and contact information given.

## 2016-04-25 ENCOUNTER — Telehealth: Payer: Self-pay | Admitting: Internal Medicine

## 2016-04-25 NOTE — Telephone Encounter (Signed)
Patient has appointments tomorrow at the cancer center for breast cancer.  She wants to make sure that if any surgical clearances need completed that Dr. Harrington Challenger is the one that does it.  She was last seen in 06/2013 by Dr. Harrington Challenger.     I advised that if there is a need for surgical clearance, the cancer center will communicate with Dr. Harrington Challenger and the patient does not have to worry this piece of it.      She is appreciative for this information and would like Dr. Harrington Challenger to know what is happening with her.

## 2016-04-25 NOTE — Telephone Encounter (Signed)
NEw Message   Pt sched to see Cancer center tomorrow 5/31- wanted to discuss w/ RN before appt (Pt last seen [phone]). Please call back and discuss.

## 2016-04-25 NOTE — Telephone Encounter (Signed)
I cannot give clearance as I saw her in 2014  Would need to make an appt to see how doing now.

## 2016-04-26 ENCOUNTER — Ambulatory Visit: Payer: Medicare Other | Attending: General Surgery | Admitting: Physical Therapy

## 2016-04-26 ENCOUNTER — Encounter: Payer: Self-pay | Admitting: Oncology

## 2016-04-26 ENCOUNTER — Encounter: Payer: Self-pay | Admitting: Physical Therapy

## 2016-04-26 ENCOUNTER — Other Ambulatory Visit (HOSPITAL_BASED_OUTPATIENT_CLINIC_OR_DEPARTMENT_OTHER): Payer: Medicare Other

## 2016-04-26 ENCOUNTER — Ambulatory Visit
Admission: RE | Admit: 2016-04-26 | Discharge: 2016-04-26 | Disposition: A | Discharge status: Home / Self Care | Payer: Medicare Other | Source: Ambulatory Visit | Attending: Radiation Oncology | Admitting: Radiation Oncology

## 2016-04-26 ENCOUNTER — Ambulatory Visit (HOSPITAL_BASED_OUTPATIENT_CLINIC_OR_DEPARTMENT_OTHER): Payer: Medicare Other | Admitting: Oncology

## 2016-04-26 VITALS — BP 145/91 | HR 88 | Temp 98.5°F | Resp 18 | Ht 63.0 in | Wt 165.6 lb

## 2016-04-26 DIAGNOSIS — C50411 Malignant neoplasm of upper-outer quadrant of right female breast: Secondary | ICD-10-CM

## 2016-04-26 DIAGNOSIS — R293 Abnormal posture: Secondary | ICD-10-CM | POA: Insufficient documentation

## 2016-04-26 DIAGNOSIS — C50911 Malignant neoplasm of unspecified site of right female breast: Secondary | ICD-10-CM | POA: Diagnosis not present

## 2016-04-26 DIAGNOSIS — Z170 Estrogen receptor positive status [ER+]: Secondary | ICD-10-CM

## 2016-04-26 LAB — CBC WITH DIFFERENTIAL/PLATELET
BASO%: 0.2 % (ref 0.0–2.0)
BASOS ABS: 0 10*3/uL (ref 0.0–0.1)
EOS ABS: 0.1 10*3/uL (ref 0.0–0.5)
EOS%: 0.6 % (ref 0.0–7.0)
HEMATOCRIT: 41.4 % (ref 34.8–46.6)
HEMOGLOBIN: 13.7 g/dL (ref 11.6–15.9)
LYMPH%: 48.6 % (ref 14.0–49.7)
MCH: 27.3 pg (ref 25.1–34.0)
MCHC: 33.1 g/dL (ref 31.5–36.0)
MCV: 82.7 fL (ref 79.5–101.0)
MONO#: 0.6 10*3/uL (ref 0.1–0.9)
MONO%: 5.8 % (ref 0.0–14.0)
NEUT#: 4.8 10*3/uL (ref 1.5–6.5)
NEUT%: 44.8 % (ref 38.4–76.8)
Platelets: 302 10*3/uL (ref 145–400)
RBC: 5 10*6/uL (ref 3.70–5.45)
RDW: 13.9 % (ref 11.2–14.5)
WBC: 10.8 10*3/uL — ABNORMAL HIGH (ref 3.9–10.3)
lymph#: 5.2 10*3/uL — ABNORMAL HIGH (ref 0.9–3.3)

## 2016-04-26 LAB — COMPREHENSIVE METABOLIC PANEL
ALBUMIN: 3.7 g/dL (ref 3.5–5.0)
ALK PHOS: 83 U/L (ref 40–150)
ALT: 16 U/L (ref 0–55)
AST: 18 U/L (ref 5–34)
Anion Gap: 10 mEq/L (ref 3–11)
BILIRUBIN TOTAL: 0.38 mg/dL (ref 0.20–1.20)
BUN: 9.7 mg/dL (ref 7.0–26.0)
CALCIUM: 9.2 mg/dL (ref 8.4–10.4)
CO2: 29 mEq/L (ref 22–29)
Chloride: 102 mEq/L (ref 98–109)
Creatinine: 0.9 mg/dL (ref 0.6–1.1)
EGFR: 67 mL/min/{1.73_m2} — AB (ref >90)
Glucose: 98 mg/dl (ref 70–140)
POTASSIUM: 3.5 meq/L (ref 3.5–5.1)
Sodium: 140 mEq/L (ref 136–145)
TOTAL PROTEIN: 7 g/dL (ref 6.4–8.3)

## 2016-04-26 LAB — TECHNOLOGIST REVIEW

## 2016-04-26 NOTE — Progress Notes (Signed)
Radiation Oncology         [phone]) [phone] ________________________________  Initial Outpatient Consultation - Date: 04/26/2016   Name: Desiree Anderson MRN: [id number]   DOB: October 08, 1956  REFERRING PHYSICIAN: Rolm Bookbinder, MD  DIAGNOSIS AND STAGE: No matching staging information was found for the patient.  Breast cancer of upper-outer quadrant of right female breast.  HISTORY OF PRESENT ILLNESS::Desiree Anderson is a 60 y.o. female who presents today because of an abnormal routine mammogram. She had a diagnostic mammogram and ultrasound 04/18/2016, revealing a suspicious right breast mass with focal distortion, 7 x 7 x 10 mm mixed echogenicity at the 11 o'clock position 3 CFN. She had a biopsy 04/18/2016, revealing invasive and in situ ductal carcinoma, ER (100%), PR (100%), Her2-neu negative, Ki 67 (5%). She is here today to discuss radiation treatment options. She is accompanied by her husband and children.   PREVIOUS RADIATION THERAPY: No  Past medical, social and family history were reviewed in the electronic chart. Review of symptoms was reviewed in the electronic chart. Medications were reviewed in the electronic chart.   PHYSICAL EXAM: There were no vitals filed for this visit.. .  No palpable cervical, supraclavicular, or axillary adenopathy. Large ptotic breasts bilaterally. Palpable ceroma cavity in the lower outer quadrant of the right breast. No palpable abnormalities in the left breast.  Vitals with BMI 04/26/2016  Height '5\' 3"'   Weight 165 lbs 10 oz  BMI 17.7  Systolic 939  Diastolic 91  Pulse 88  Respirations 18   Gynecologic History  Age at first menstrual period? 12 or 13  Are you still having periods? No Approximate date of last period? 2003  If you no longer have periods: Have you used hormone replacement? Yes  If YES, for how long? 3 years When did you stop? 2012 Obstetric History:  How many children have you carried to term? 2 Your age at first live birth?  24  Pregnant now or trying to get pregnant? No  Have you used birth control pills or hormone shots for contraception? Yes  If so, for how long (or approximate dates)? 1980's - 2000  Would you be interested in learning more about the options to preserve fertility? No Health Maintenance:  Have you ever had a colonoscopy? Yes If yes, date? 2007  Have you ever had a bone density? Yes If yes, date? Around 2012 with Dr. Unk Pinto  Date of your last PAP smear? Apr 18, 2016  IMPRESSION: Desiree Anderson is a 59 yo woman with invasive and in situ ductal carcinoma of the right breast. She is a good candidate for a right lumpectomy and sentinel lymph node biopsy, oncotype testing, external radiation, and aromatase inhibitor.  PLAN: I spoke to the patient today regarding her diagnosis and options for treatment. We discussed the equivalence in terms of survival and local failure between mastectomy and breast conservation. We discussed the role of radiation in decreasing local failures in patients who undergo lumpectomy. We discussed 5 weeks of treatment as an outpatient. We discussed the low likelihood of secondary malignancies. We discussed the possible side effects including but not limited to skin redness, fatigue, permanent skin darkening, and breast swelling. We discussed that she would be able to get a reduction during a lumpectomy.    I spent 40 minutes  face to face with the patient and more than 50% of that time was spent in counseling and/or coordination of care.   ------------------------------------------------  Thea Silversmith, MD    This  document serves as a record of services personally performed by Thea Silversmith, MD. It was created on her behalf by  Lendon Collar, a trained medical scribe. The creation of this record is based on the scribe's personal observations and the provider's statements to them. This document has been checked and approved by the attending provider.

## 2016-04-26 NOTE — Patient Instructions (Signed)

## 2016-04-26 NOTE — Therapy (Addendum)
[place], [place], [address] Phone: [phone]   Fax:  [phone]  Physical Therapy Evaluation  Patient Details  Name: Desiree Anderson MRN: [id number] Date of Birth: 1955-12-31 Referring Provider: Dr. Rolm Bookbinder  Encounter Date: 04/26/2016      PT End of Session - 04/26/16 1820    Visit Number 1   Number of Visits 1   PT Start Time 1350   PT Stop Time 1417   PT Time Calculation (min) 27 min   Activity Tolerance Patient tolerated treatment well   Behavior During Therapy [facility] for tasks assessed/performed      Past Medical History  Diagnosis Date  . Hypertension   . Fibromyalgia   . Hypothyroidism   . GERD (gastroesophageal reflux disease)   . DDD (degenerative disc disease), lumbar   . Anxiety   . Depression   . Erosion of vaginal mesh ([place])   . Rectocele   . History of gastric ulcer   . Breast cancer [facility])     Past Surgical History  Procedure Laterality Date  . Incontinence surgery  2009  &  2005  . Bladder tack  2003  . Breast lumpectomy  1985  . Vein bypass surgery  AGE 60    RIGHT LEG  . Transthoracic echocardiogram  06-16-2013    GRADE I DIASTOLIC DYSFUNCTION/  EF 55-60%/  MILD AR/  TRIVIAL MR  &  TR  . Cardiovascular stress test  07-02-2013  [facility]    LOW RISK NUCLEAR STUDY WITH A SMALL, MILD APICAL SEPTAL FIXED PERFUSION DEFECT .  GIVEN NORMAL WALL FUNCTION , SUSPECT REPRESENTS ATTENUATION/  EF 74%/  NO ISCHEMIA  . Cysto/  hydrodistention/  bladder bx/   instillation therapy  08-29-2010  . Vaginal hysterectomy  2000    w/  unilateral salpingoophorectomy  . Cataract extraction w/ intraocular lens implant Left 01/2014  . Excision of mesh N/A 03/23/2014    Procedure: EXCISION OF VAGINAL AND PERI-URETHRAL MESH, EXTRACTION FROM URETHRA TO APEX, insertion of XENFORM in the vaginal vault;  Surgeon: Ailene Rud, MD;  Location: [facility];  Service:  Urology;  Laterality: N/A;  . Excision vaginal cyst N/A 04/06/2014    Procedure: VAGINAL SPECULUM EXAMINATION/POSSIBLE DRAINAGE OF HEMATOMA;  Surgeon: Ailene Rud, MD;  Location: [facility];  Service: Urology;  Laterality: N/A;    There were no vitals filed for this visit.       Subjective Assessment - 04/26/16 1820    Subjective Patient reports she is here today to be seen by her medical team for her newly diagnosed right breast cancer.   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  It is located in the upper outer quadrant, measures 1 cm, has a Ki67 of 5%, and is ER/PR positive, and HER2 negative.  Her multidisciplinary medical team met prior to her assessment to determine a recommended treatment plan.   Patient Stated Goals reduce lymphedema risk and learn post op shoulder ROM HEP            [facility] PT Assessment - 04/26/16 0001    Assessment   Medical Diagnosis Right breast cancer   Referring Provider Dr. Rolm Bookbinder   Onset Date/Surgical Date 04/19/16   Hand Dominance Right   Prior Therapy none   Precautions   Precautions Other (comment)  Precaution Comments Active breast cancer   Restrictions   Weight Bearing Restrictions No   Balance Screen   Has the patient fallen in the past 6 months No   Has the patient had a decrease in activity level because of a fear of falling?  No   Is the patient reluctant to leave their home because of a fear of falling?  No   Home Ecologist residence   Living Arrangements Spouse/significant other   Available Help at Discharge Family   Prior Function   Level of [place] Retired   Leisure She does not exercise   Cognition   Overall Cognitive Status Within Functional Limits for tasks assessed   Posture/Postural Control    Posture/Postural Control Postural limitations   Postural Limitations Rounded Shoulders;Forward head;Decreased lumbar lordosis   ROM / Strength   AROM / PROM / Strength AROM;Strength   AROM   AROM Assessment Site Shoulder;Cervical   Right/Left Shoulder Right;Left   Right Shoulder Extension 53 Degrees   Right Shoulder Flexion 144 Degrees   Right Shoulder ABduction 167 Degrees   Right Shoulder Internal Rotation 70 Degrees   Right Shoulder External Rotation 75 Degrees   Left Shoulder Extension 52 Degrees   Left Shoulder Flexion 132 Degrees   Left Shoulder ABduction 150 Degrees   Left Shoulder Internal Rotation 70 Degrees   Left Shoulder External Rotation 75 Degrees   Cervical Flexion WNL   Cervical Extension WNL   Cervical - Right Side Bend WNL   Cervical - Left Side Bend WNL   Cervical - Right Rotation WNL   Cervical - Left Rotation 25% limited   Strength   Overall Strength Within functional limits for tasks performed           LYMPHEDEMA/ONCOLOGY QUESTIONNAIRE - 04/26/16 1816    Type   Cancer Type Right breast cancer   Lymphedema Assessments   Lymphedema Assessments Upper extremities   Right Upper Extremity Lymphedema   10 cm Proximal to Olecranon Process 27.5 cm   Olecranon Process 23.7 cm   10 cm Proximal to Ulnar Styloid Process 20.4 cm   Just Proximal to Ulnar Styloid Process 14.5 cm   Across Hand at [employer] 17.1 cm   At [place] of 2nd Digit 5.8 cm   Left Upper Extremity Lymphedema   10 cm Proximal to Olecranon Process 28.2 cm   Olecranon Process 24 cm   10 cm Proximal to Ulnar Styloid Process 19.8 cm   Just Proximal to Ulnar Styloid Process 14.4 cm   Across Hand at [employer] 17.7 cm   At [place] of 2nd Digit 6 cm      Patient was instructed today in a home exercise program today for post op shoulder range of motion. These included active assist shoulder flexion in sitting, scapular retraction, wall walking with shoulder abduction, and hands behind head  external rotation.  She was encouraged to do these twice a day, holding 3 seconds and repeating 5 times when permitted by her physician.         PT Education - 04/26/16 1819    Education provided Yes   Education Details Lymphedema risk reduction and post op shoulder ROM HEP   Person(s) Educated Patient   Methods Explanation;Demonstration;Handout   Comprehension Returned demonstration;Verbalized understanding              Breast Clinic Goals - 04/26/16 [place]    Patient will be able to  verbalize understanding of pertinent lymphedema risk reduction practices relevant to her diagnosis specifically related to skin care.   Time 1   Period Days   Status Achieved   Patient will be able to return demonstrate and/or verbalize understanding of the post-op home exercise program related to regaining shoulder range of motion.   Time 1   Period Days   Status Achieved   Patient will be able to verbalize understanding of the importance of attending the postoperative After Breast Cancer Class for further lymphedema risk reduction education and therapeutic exercise.   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  It is located in the upper outer quadrant, measures 1 cm, has a Ki67 of 5%, and is ER/PR positive, and HER2 negative.  Her multidisciplinary medical team met prior to her assessment to determine a recommended treatment plan.  She is planning to have a right lumpectomy and sentinel node biopsy followed by Oncotype testing, radiation and anti-estrogen therapy.  She will benefit from post op PT to regain shoulder ROM and reduce lymphedema risk.  Due to her lack of comorbidities that would impact rehab, this is a low complexity eval.   Rehab Potential Excellent   Clinical Impairments Affecting Rehab Potential none   PT  Frequency One time visit   PT Treatment/Interventions Therapeutic exercise;Patient/family education   PT Next Visit Plan Will f/u after surgery   PT Home Exercise Plan Post op shoulder ROM HEP   Consulted and Agree with Plan of Care Patient      Patient will benefit from skilled therapeutic intervention in order to improve the following deficits and impairments:  Decreased strength, Pain, Decreased knowledge of precautions, Impaired UE functional use, Decreased range of motion, Postural dysfunction  Visit Diagnosis: Abnormal posture - Plan: PT plan of care cert/re-cert  Carcinoma of upper-outer quadrant of right female breast ([place]) - Plan: PT plan of care cert/re-cert   Patient will follow up at outpatient cancer rehab if needed following surgery.  If the patient requires physical therapy at that time, a specific plan will be dictated and sent to the referring physician for approval. The patient was educated today on appropriate basic range of motion exercises to begin post operatively and the importance of attending the After Breast Cancer class following surgery.  Patient was educated today on lymphedema risk reduction practices as it pertains to recommendations that will benefit the patient immediately following surgery.  She verbalized good understanding.  No additional physical therapy is indicated at this time.    G-Codes completed on 05/20/2016 using Clinical Judgement: [facility] PT THERAPY OTHER OT PT DISCHARGE STATUS [id number] 2016/05/20 Toribio Harbour, PT 1 Filed  [facility] PT THERAPY OTHER OT PT GOAL STATUS [id number] 05/20/2016 Toribio Harbour, PT 1 Filed  [facility] PT THERAPY OTHER OT PT CURRENT STATUS [id number] 05/20/16 Toribio Harbour, PT 1 Filed   Problem List Patient Active Problem List   Diagnosis Date Noted  . Breast cancer of upper-outer quadrant of right female breast ([place]) 04/21/2016  . Hypothyroidism 04/18/2016  . BMI 29.0-29.9,adult 10/11/2015  . Medicare annual wellness visit, initial  10/11/2015  . Medication management 09/24/2014  . Cystocele, midline 03/23/2014  . Essential hypertension 12/10/2013  . Mixed hyperlipidemia 12/10/2013  . Prediabetes 12/10/2013  .  Vitamin D deficiency 12/10/2013  . DJD  12/10/2013  . DDD, lumbar 12/10/2013  . Fibromyalgia Syndrome 12/10/2013  . Coitalgia 09/21/2011  . Alteration in bowel elimination: incontinence 09/21/2011  . Abnormal female sexual function 09/21/2011    Annia Friendly, PT 04/26/2016 6:33 PM  [place], [place], [address] Phone: [phone]   Fax:  [phone]  Name: Desiree Anderson MRN: [id number] Date of Birth: [birth year]  Annia Friendly, PT 05/08/2016 11:38 AM

## 2016-04-26 NOTE — Progress Notes (Signed)
[facility]  Telephone:([phone]) [phone] Fax:(336) [phone]     ID: Desiree Anderson DOB: [birth year]  MR#: [id number]  BWI#:[id number]  Patient Care Team: Unk Pinto, MD as PCP - General (Internal Medicine) Fay Records, MD as Consulting Physician (Cardiology) [name], MD as Consulting Physician (Orthopedic Surgery) Carolan Clines, MD as Consulting Physician (Urology) Vanessa Kick, MD as Consulting Physician (Obstetrics and Gynecology) Chauncey Cruel, MD as Consulting Physician (Oncology) Rolm Bookbinder, MD as Consulting Physician (General Surgery) Thea Silversmith, MD as Consulting Physician (Radiation Oncology) PCP: Alesia Richards, MD GYN: OTHER MD:  CHIEF COMPLAINT: Estrogen receptor positive invasive breast cancer  CURRENT TREATMENT: Awaiting definitive surgery   BREAST CANCER HISTORY: [name] had routine screening mammography showing a possible area of distortion in the right breast. On 04/18/2016 she had diagnostic right mammography with tomography and right breast ultrasonography at the [facility]. Breast density was category C. In the upper outer quadrant of the right breast there was a persistent area of architectural distortion which was not palpable on exam. Ultrasonography confirmed a 1.0 cm irregular mass at the 11:00 position 3 cm from the nipple. There was no right axillary lymphadenopathy by ultrasonography.  Biopsy of the right breast mass in question was obtained the same day and showed (S [id number]) an invasive ductal carcinoma, grade 2, estrogen and progesterone receptor both 100% positive with strong staining intensity, with an MIB-1 of 5%, and no HER-2 amplification, the signals ratio being 1.57, the number per cell 3.85.  Her subsequent history is as detailed below  INTERVAL HISTORY: Desiree Anderson was evaluated in the multidisciplinary breast cancer clinic 04/26/2016 accompanied by her husband Desiree Anderson, her daughter Desiree Anderson, her son in  law Desiree Anderson, and her friend Desiree Anderson. Her case was also presented in the multidisciplinary breast cancer conference that same morning. At that time a preliminary plan was proposed: Breast conserving surgery with sentinel lymph node sampling, Oncotype DX, then radiation and anti-estrogens  REVIEW OF SYSTEMS: There were no specific symptoms leading to the original mammogram, which was routinely scheduled. The patient denies unusual headaches, visual changes, nausea, vomiting, stiff neck, dizziness, or gait imbalance. There has been no cough, phlegm production, or pleurisy, no chest pain or pressure, and no change in bowel or bladder habits. The patient denies fever, rash, bleeding, or unexplained weight loss. She admits to mild fatigue, a little pain in her midline back and other joints which is not new and is not more intense or persistent than before. She has seasonal allergies. This includes a little bit of hoarseness and a dry cough. She feels forgetful and anxious but not depressed. A detailed review of systems was otherwise entirely negative.  PAST MEDICAL HISTORY: Past Medical History  Diagnosis Date  . Hypertension   . Fibromyalgia   . Hypothyroidism   . GERD (gastroesophageal reflux disease)   . DDD (degenerative disc disease), lumbar   . Anxiety   . Depression   . Erosion of vaginal mesh ([place])   . Rectocele   . History of gastric ulcer   . Breast cancer ([place])     PAST SURGICAL HISTORY: Past Surgical History  Procedure Laterality Date  . Incontinence surgery  2009  &  2005  . Bladder tack  2003  . Breast lumpectomy  1985  . Vein bypass surgery  AGE 60    RIGHT LEG  . Transthoracic echocardiogram  06-16-2013    GRADE I DIASTOLIC DYSFUNCTION/  EF 55-60%/  MILD AR/  TRIVIAL MR  &  TR  . Cardiovascular stress Anderson  07-02-2013  DR DALTON MCLEAN    LOW RISK NUCLEAR STUDY WITH A SMALL, MILD APICAL SEPTAL FIXED PERFUSION DEFECT .  GIVEN NORMAL WALL FUNCTION , SUSPECT REPRESENTS  ATTENUATION/  EF 74%/  NO ISCHEMIA  . Cysto/  hydrodistention/  bladder bx/   instillation therapy  08-29-2010  . Vaginal hysterectomy  2000    w/  unilateral salpingoophorectomy  . Cataract extraction w/ intraocular lens implant Left 01/2014  . Excision of mesh N/A 03/23/2014    Procedure: EXCISION OF VAGINAL AND PERI-URETHRAL MESH, EXTRACTION FROM URETHRA TO APEX, insertion of XENFORM in the vaginal vault;  Surgeon: Ailene Rud, MD;  Location: [facility];  Service: Urology;  Laterality: N/A;  . Excision vaginal cyst N/A 04/06/2014    Procedure: VAGINAL SPECULUM EXAMINATION/POSSIBLE DRAINAGE OF HEMATOMA;  Surgeon: Ailene Rud, MD;  [facility]  Service: Urology;  Laterality: N/A;    FAMILY HISTORY Family History  Problem Relation Age of Onset  . Lung cancer Brother   [name] father died at the age of 9 from a myocardial infarction. He was not a smoker however. [name] mother died with renal failure at the age of 15. The patient had 4 brothers, 3 sisters. The only cancer in the family was one brother diagnosed with lung cancer at the age of 66. There is no history of breast or ovarian cancer in the family.  GYNECOLOGIC HISTORY:  No LMP recorded. Patient has had an ablation. Menarche age 20, first live birth age 17, the patient is [place] P2. She stopped having periods approximately 2003. She took hormone replacement approximately 3 years. She also used birth control pills remotely for approximately 20 years, with no complications.  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 Her husband Desiree Anderson family business, produce an plants, in [employer], for 17 years. He is now retired. Daughter Dentist lives in [place] and is a homemaker. Son Carlynn Spry Goins lives in [place] and has multiple health problems. The patient has 2 granddaughters.  She also has 2 grandchildren who have died. She is a Furniture conservator/restorer.    ADVANCED DIRECTIVES: Not in place   HEALTH MAINTENANCE: Social History  Substance Use Topics  . Smoking status: Former Smoker -- 20 years    Types: Cigarettes    Quit date: 11/28/1995  . Smokeless tobacco: Current User    Types: Snuff     Comment: 4oz can last 14-16days  . Alcohol Use: No     Colonoscopy: 2007/ [facility]  PAP:  Bone density: 2012/ [facility]  Lipid panel:  Allergies  Allergen Reactions  . Lipitor [Atorvastatin]     Muscle and legs aches  . Shellfish Allergy Diarrhea and Nausea And Vomiting    Current Outpatient Prescriptions  Medication Sig Dispense Refill  . ALPRAZolam (XANAX) 0.5 MG tablet TAKE ONE TABLET BY MOUTH THREE TIMES DAILY AS NEEDED 90 tablet 0  . B Complex-C (SUPER B COMPLEX PO) Take 1 tablet by mouth daily.     . cetirizine (ZYRTEC) 10 MG tablet Take 10 mg by mouth daily as needed for allergies.    . Cholecalciferol (VITAMIN D-3) 5000 UNITS TABS Take 2 tablets by mouth daily.     Marland Kitchen guaiFENesin (MUCINEX) 600 MG 12 hr tablet Take 600 mg by mouth 2 (two)  times daily as needed.    . hydrochlorothiazide (HYDRODIURIL) 25 MG tablet TAKE ONE TABLET BY MOUTH ONCE DAILY 90 tablet 1  . hyoscyamine (LEVSIN SL) 0.125 MG SL tablet Take 1 to 2 tablets 3 to 4 x day if needed for Nausea, vomiting, cramping or diarrhea 90 tablet 0  . KLOR-CON M20 20 MEQ tablet TAKE ONE TABLET BY MOUTH TWICE DAILY 180 tablet 1  . levothyroxine (SYNTHROID, LEVOTHROID) 88 MCG tablet TAKE ONE TABLET BY MOUTH ONCE DAILY BEFORE BREAKFAST 90 tablet 2  . Multiple Vitamins-Minerals (GERITABS PO) Take 1 tablet by mouth daily.    . ranitidine (ZANTAC) 300 MG tablet TAKE ONE TABLET BY MOUTH TWICE DAILY 180 tablet 3  . rosuvastatin (CRESTOR) 40 MG tablet Take 1 tablet (40 mg total) by mouth daily. 90 tablet 1  . terbinafine (LAMISIL) 250 MG tablet Take 1 tablet (250 mg total) by mouth daily. 90 tablet 0  . triazolam  (HALCION) 0.25 MG tablet TAKE ONE TABLET BY MOUTH AT BEDTIME 30 tablet 5  . fluticasone (FLONASE) 50 MCG/ACT nasal spray Place 2 sprays into both nostrils daily as needed.      No current facility-administered medications for this visit.    OBJECTIVE: Middle-aged white woman in no acute distress Filed Vitals:   04/26/16 1255  BP: 145/91  Pulse: 88  Temp: 98.5 F (36.9 C)  Resp: 18     Body mass index is 29.34 kg/(m^2).    ECOG FS:1 - Symptomatic but completely ambulatory  Ocular: Sclerae unicteric, pupils equal, round and reactive to light Ear-nose-throat: Oropharynx clear and moist Lymphatic: No cervical or supraclavicular adenopathy Lungs no rales or rhonchi, good excursion bilaterally Heart regular rate and rhythm, no murmur appreciated Abd soft, nontender, positive bowel sounds MSK no focal spinal tenderness, no joint edema Neuro: non-focal, well-oriented, appropriate affect Breasts: The right breast is status post recent biopsy. There is an associated ecchymosis. There are no skin or nipple changes of concern. The right axilla is benign. The left breast is unremarkable.   LAB RESULTS:  CMP     Component Value Date/Time   NA 140 04/26/2016 1224   NA 141 04/18/2016 1159   K 3.5 04/26/2016 1224   K 3.8 04/18/2016 1159   CL 100 04/18/2016 1159   CO2 29 04/26/2016 1224   CO2 30 04/18/2016 1159   GLUCOSE 98 04/26/2016 1224   GLUCOSE 84 04/18/2016 1159   BUN 9.7 04/26/2016 1224   BUN 8 04/18/2016 1159   CREATININE 0.9 04/26/2016 1224   CREATININE 0.65 04/18/2016 1159   CREATININE 0.64 03/23/2014 0900   CALCIUM 9.2 04/26/2016 1224   CALCIUM 9.6 04/18/2016 1159   PROT 7.0 04/26/2016 1224   PROT 6.7 04/18/2016 1159   ALBUMIN 3.7 04/26/2016 1224   ALBUMIN 4.3 04/18/2016 1159   AST 18 04/26/2016 1224   AST 17 04/18/2016 1159   ALT 16 04/26/2016 1224   ALT 18 04/18/2016 1159   ALKPHOS 83 04/26/2016 1224   ALKPHOS 83 04/18/2016 1159   BILITOT 0.38 04/26/2016 1224    BILITOT 0.4 04/18/2016 1159   GFRNONAA >89 04/18/2016 1159   GFRNONAA >90 03/23/2014 0900   GFRAA >89 04/18/2016 1159   GFRAA >90 03/23/2014 0900    INo results found for: SPEP, UPEP  Lab Results  Component Value Date   WBC 10.8* 04/26/2016   NEUTROABS 4.8 04/26/2016   HGB 13.7 04/26/2016   HCT 41.4 04/26/2016   MCV 82.7 04/26/2016   PLT 302 04/26/2016  Chemistry      Component Value Date/Time   NA 140 04/26/2016 1224   NA 141 04/18/2016 1159   K 3.5 04/26/2016 1224   K 3.8 04/18/2016 1159   CL 100 04/18/2016 1159   CO2 29 04/26/2016 1224   CO2 30 04/18/2016 1159   BUN 9.7 04/26/2016 1224   BUN 8 04/18/2016 1159   CREATININE 0.9 04/26/2016 1224   CREATININE 0.65 04/18/2016 1159   CREATININE 0.64 03/23/2014 0900      Component Value Date/Time   CALCIUM 9.2 04/26/2016 1224   CALCIUM 9.6 04/18/2016 1159   ALKPHOS 83 04/26/2016 1224   ALKPHOS 83 04/18/2016 1159   AST 18 04/26/2016 1224   AST 17 04/18/2016 1159   ALT 16 04/26/2016 1224   ALT 18 04/18/2016 1159   BILITOT 0.38 04/26/2016 1224   BILITOT 0.4 04/18/2016 1159       No results found for: LABCA2  No components found for: LABCA125  No results for input(s): INR in the last 168 hours.  Urinalysis    Component Value Date/Time   COLORURINE YELLOW 10/11/2015 1628   APPEARANCEUR CLEAR 10/11/2015 1628   LABSPEC 1.007 10/11/2015 1628   PHURINE 6.0 10/11/2015 1628   GLUCOSEU NEGATIVE 10/11/2015 1628   HGBUR NEGATIVE 10/11/2015 1628   BILIRUBINUR NEGATIVE 10/11/2015 1628   KETONESUR NEGATIVE 10/11/2015 1628   PROTEINUR NEGATIVE 10/11/2015 1628   NITRITE NEGATIVE 10/11/2015 1628   LEUKOCYTESUR NEGATIVE 10/11/2015 1628      ELIGIBLE FOR AVAILABLE RESEARCH PROTOCOL: no  STUDIES: Mm Digital Diagnostic Unilat R  04/18/2016  CLINICAL DATA:  Patient status post ultrasound-guided core needle biopsy right breast mass. EXAM: 3D DIAGNOSTIC RIGHT MAMMOGRAM POST ULTRASOUND BIOPSY COMPARISON:  Previous  exam(s). FINDINGS: 3D Mammographic images were obtained following ultrasound guided biopsy of right breast mass corresponding with mammographic distortion. Ribbon shaped marking clip in appropriate position IMPRESSION: Appropriate position ribbon shaped marking clip status post ultrasound-guided core needle biopsy right breast mass. Final Assessment: Post Procedure Mammograms for Marker Placement Electronically Signed   By: Lovey Newcomer M.D.   On: 04/18/2016 15:01   US Breast Ltd Uni Right Inc Axilla  04/18/2016  CLINICAL DATA:  Patient recalled from screening for right breast distortion. EXAM: 2D DIGITAL DIAGNOSTIC RIGHT MAMMOGRAM WITH CAD AND ADJUNCT TOMO ULTRASOUND RIGHT BREAST COMPARISON:  Previous exam(s). ACR Breast Density Category c: The breast tissue is heterogeneously dense, which may obscure small masses. FINDINGS: CC and MLO tomosynthesis images of the right breast were obtained demonstrating persistent architectural distortion within the superior slightly lateral right breast anterior depth. Mammographic images were processed with CAD. On physical exam, I palpate no discrete mass within the upper-outer right breast. Targeted ultrasound is performed, showing a 7 x 7 x 10 mm mixed echogenicity taller than wide irregular mass within the right breast 11 o'clock position 3 cm from the nipple with internal color vascularity, corresponding with mammographic abnormality. No right axillary lymphadenopathy. IMPRESSION: Suspicious right breast mass corresponding with focal distortion on mammography. RECOMMENDATION: Ultrasound-guided core needle biopsy right breast mass. I have discussed the findings and recommendations with the patient. Results were also provided in writing at the conclusion of the visit. If applicable, a reminder letter will be sent to the patient regarding the next appointment. BI-RADS CATEGORY  4: Suspicious. Electronically Signed   By: Lovey Newcomer M.D.   On: 04/18/2016 13:48   Mm Diag  Breast Tomo Uni Right  04/18/2016  CLINICAL DATA:  Patient recalled from screening for  right breast distortion. EXAM: 2D DIGITAL DIAGNOSTIC RIGHT MAMMOGRAM WITH CAD AND ADJUNCT TOMO ULTRASOUND RIGHT BREAST COMPARISON:  Previous exam(s). ACR Breast Density Category c: The breast tissue is heterogeneously dense, which may obscure small masses. FINDINGS: CC and MLO tomosynthesis images of the right breast were obtained demonstrating persistent architectural distortion within the superior slightly lateral right breast anterior depth. Mammographic images were processed with CAD. On physical exam, I palpate no discrete mass within the upper-outer right breast. Targeted ultrasound is performed, showing a 7 x 7 x 10 mm mixed echogenicity taller than wide irregular mass within the right breast 11 o'clock position 3 cm from the nipple with internal color vascularity, corresponding with mammographic abnormality. No right axillary lymphadenopathy. IMPRESSION: Suspicious right breast mass corresponding with focal distortion on mammography. RECOMMENDATION: Ultrasound-guided core needle biopsy right breast mass. I have discussed the findings and recommendations with the patient. Results were also provided in writing at the conclusion of the visit. If applicable, a reminder letter will be sent to the patient regarding the next appointment. BI-RADS CATEGORY  4: Suspicious. Electronically Signed   By: Lovey Newcomer M.D.   On: 04/18/2016 13:48   [place] Rt Breast Bx W Loc Dev 1st Lesion Img Bx Spec US Guide  04/19/2016  ADDENDUM REPORT: 04/19/2016 12:54 ADDENDUM: Pathology revealed GRADE II INVASIVE DUCTAL CARCINOMA, DUCTAL CARCINOMA IN SITU of the Right breast at the 11:00 o'clock location. This was found to be concordant by Dr. Lovey Newcomer. Pathology results were discussed with the patient by telephone. The patient reported doing well after the biopsy with tenderness at the site. Post biopsy instructions and care were reviewed and  questions were answered. The patient was encouraged to call [facility] for any additional concerns. The patient was referred to [facility] on Apr 26, 2016. Pathology results reported by Terie Purser, RN on 04/19/2016. Electronically Signed   By: Lovey Newcomer M.D.   On: 04/19/2016 12:54  04/19/2016  CLINICAL DATA:  Suspicious right breast mass corresponding with architectural distortion on mammography. EXAM: ULTRASOUND GUIDED RIGHT BREAST CORE NEEDLE BIOPSY COMPARISON:  Previous exam(s). FINDINGS: I met with the patient and we discussed the procedure of ultrasound-guided biopsy, including benefits and alternatives. We discussed the high likelihood of a successful procedure. We discussed the risks of the procedure, including infection, bleeding, tissue injury, clip migration, and inadequate sampling. Informed written consent was given. The usual time-out protocol was performed immediately prior to the procedure. Using sterile technique and 1% Lidocaine as local anesthetic, under direct ultrasound visualization, a 12 gauge spring-loaded device was used to perform biopsy of right breast mass 11 o'clock position using a medial approach. At the conclusion of the procedure a ribbon shaped tissue marker clip was deployed into the biopsy cavity. Follow up 2 view mammogram was performed and dictated separately. IMPRESSION: Ultrasound guided biopsy of right breast mass 11 o'clock position. No apparent complications. Electronically Signed: By: Lovey Newcomer M.D. On: 04/18/2016 15:00    ASSESSMENT: 60 y.o. [place] woman status post right breast upper outer quadrant biopsy 04/18/2016 for a clinical T1b N0, stage IA invasive ductal carcinoma, grade 2, estrogen and progesterone receptor strongly positive, HER-2 nonamplified, with an MIB-1 of 5%.   (1) breast conserving surgery with sentinel lymph node sampling  pending  (2) Oncotype DX will be sent from the definitive surgical samples to help clarify the chemotherapy question  (3) adjuvant radiation to follow  (  4) anti-estrogens to follow at the completion of local treatment  PLAN: We spent the better part of today's hour-long appointment discussing the biology of breast cancer in general, and the specifics of the patient's tumor in particular. Bren has a good understanding of the difference between local and systemic therapy for breast cancer. We specifically discussed local treatment in detail and she expressed some interest in bilateral mastectomies. She understands this would not improve survival and it might not (although it probably would) obviate the need for radiation. It might not significantly reduce the risk for local recurrence. Of course she would not need any mammography in the future. After much discussion with a good understanding the patient appeared to be comfortable with lumpectomy and radiation which is our recommendation.  We also discussed the fact that she does not meet criteria for genetics testing. Nevertheless her immediate relatives such as her daughter present today now have twice the risk of developing breast cancer as before. This risk can be numerically established by going through the [employer] which is freely available.   We then discussed systemic therapy. Quanna will clearly benefit from anti-estrogens. She would not benefit at all from anti-HER-2 treatment. On the other hand the benefit of chemotherapy, while not 0, is likely to be marginal. For that reason we are going to request an Oncotype DX on her definitive surgical specimen and my expectation is that it will come back "low risk", but we will have to wait a couple of weeks after her surgery to know that for sure.  If the Oncotype is in the low risk, we will call her with those results and she will proceed directly to radiation. Because that is what  my expectation is, I will plan to see her again in approximately 2-1/2 months. Of course if she were to need chemotherapy I would see her as soon as the Oncotype results become available  Gabriela has a good understanding of the overall plan. She agrees with it. She knows the goal of treatment in her case is cure. She will call with any problems that may develop before her next visit here.  Chauncey Cruel, MD   04/26/2016 3:43 PM Medical Oncology and Hematology [facility] [address] [place], [place] [address] Tel. [phone]    Fax. [phone]

## 2016-04-26 NOTE — Telephone Encounter (Signed)
Patient is aware that if surgical clearance is needed, we will call her to schedule an office visit.

## 2016-04-28 ENCOUNTER — Encounter: Payer: Self-pay | Admitting: General Practice

## 2016-04-28 ENCOUNTER — Other Ambulatory Visit: Payer: Medicare Other

## 2016-04-28 NOTE — Progress Notes (Signed)
Spiritual Care Note  Met with Brenetta, husband Mortimer Fries, dtr and son-in-law, and close friend Berline Chough (a hospice caregiver who also supported her own mom through breast cancer) in Breast Multidisciplinary Clinic to introduce [facility] team/resources.  Provided pastoral presence, reflective listening, normalization of feelings (for patient and for distressed family), and encouragement to utilize support resources.  Resolution of information concerns and opportunity to share/process story of dx appeared to reduce distress further.   Followed up today by phone.  Trinette was very pleased to have f/u support and opportunity to reflect aloud on her experience this week.  She states that she and family are already feeling more at ease, "and, once I make the decision [which tx she plans to do], I imagine I'll feel even better." She reports good support from her three older sisters, as well as nieces and nephews (some of whom also have experience with breast ca).  She delights in her grandchildren ("they lighten up my world--they are my world!!"), a primary source of joy and energy.  She has perused [facility] and hopes to participate when life settles a little.  She verbalized deep gratitude for providers and informational support/structure of [facility].  Keimya  her family/friend support team are aware of ongoing Support Team availability for continued support as pt navigates emotional and physical process of tx.  Please also page if needs arise/circumstances change.  Thank you.  [place], [place], [facility] Pager [phone]

## 2016-05-02 ENCOUNTER — Other Ambulatory Visit: Payer: Self-pay | Admitting: General Surgery

## 2016-05-02 ENCOUNTER — Telehealth: Payer: Self-pay | Admitting: *Deleted

## 2016-05-02 DIAGNOSIS — C50411 Malignant neoplasm of upper-outer quadrant of right female breast: Secondary | ICD-10-CM

## 2016-05-02 NOTE — Telephone Encounter (Signed)
  Oncology Nurse Navigator Documentation    Navigator Encounter Type: Telephone;MDC Follow-up (05/02/16 1400) Telephone: [facility] Crocker Call;Clinic/MDC Follow-up (05/02/16 1400)             Barriers/Navigation Needs: No barriers at this time;No Questions;No Needs (05/02/16 1400)   Interventions: Coordination of Care (Pt decided to go forward with lumpectomy and SLN alone.) (05/02/16 1400)   Coordination of Care: Other (Dr. Donne Hazel notified of pt decision.) (05/02/16 1400)                  Time Spent with Patient: 30 (05/02/16 1400)

## 2016-05-05 ENCOUNTER — Other Ambulatory Visit: Payer: Self-pay | Admitting: General Surgery

## 2016-05-05 DIAGNOSIS — C50411 Malignant neoplasm of upper-outer quadrant of right female breast: Secondary | ICD-10-CM

## 2016-05-08 ENCOUNTER — Other Ambulatory Visit: Payer: Self-pay | Admitting: General Surgery

## 2016-05-08 DIAGNOSIS — C50411 Malignant neoplasm of upper-outer quadrant of right female breast: Secondary | ICD-10-CM

## 2016-05-10 ENCOUNTER — Encounter (HOSPITAL_BASED_OUTPATIENT_CLINIC_OR_DEPARTMENT_OTHER): Payer: Self-pay | Admitting: *Deleted

## 2016-05-15 ENCOUNTER — Encounter: Payer: Self-pay | Admitting: Physician Assistant

## 2016-05-15 DIAGNOSIS — Z720 Tobacco use: Secondary | ICD-10-CM | POA: Insufficient documentation

## 2016-05-17 ENCOUNTER — Ambulatory Visit
Admission: RE | Admit: 2016-05-17 | Discharge: 2016-05-17 | Disposition: A | Discharge status: Home / Self Care | Payer: Medicare Other | Source: Ambulatory Visit | Attending: General Surgery | Admitting: General Surgery

## 2016-05-17 DIAGNOSIS — C50411 Malignant neoplasm of upper-outer quadrant of right female breast: Secondary | ICD-10-CM

## 2016-05-17 DIAGNOSIS — C50911 Malignant neoplasm of unspecified site of right female breast: Secondary | ICD-10-CM | POA: Diagnosis not present

## 2016-05-17 NOTE — Progress Notes (Signed)
Pt given box of boost breeze (wild berry flavor) to drink prior to surgery. Instructed she must finish drink by 0730 on DOS (2 hr prior to check in time). Pt verbalized understanding.

## 2016-05-18 ENCOUNTER — Ambulatory Visit (HOSPITAL_BASED_OUTPATIENT_CLINIC_OR_DEPARTMENT_OTHER): Payer: Medicare Other | Admitting: Certified Registered"

## 2016-05-18 ENCOUNTER — Encounter (HOSPITAL_BASED_OUTPATIENT_CLINIC_OR_DEPARTMENT_OTHER)
Admission: RE | Disposition: A | Discharge status: Home / Self Care | Payer: Self-pay | Source: Ambulatory Visit | Attending: General Surgery

## 2016-05-18 ENCOUNTER — Encounter (HOSPITAL_BASED_OUTPATIENT_CLINIC_OR_DEPARTMENT_OTHER): Payer: Self-pay

## 2016-05-18 ENCOUNTER — Ambulatory Visit (HOSPITAL_COMMUNITY): Payer: Medicare Other

## 2016-05-18 ENCOUNTER — Ambulatory Visit (HOSPITAL_COMMUNITY)
Admission: RE | Admit: 2016-05-18 | Discharge: 2016-05-18 | Disposition: A | Discharge status: Home / Self Care | Payer: Medicare Other | Source: Ambulatory Visit | Attending: General Surgery | Admitting: General Surgery

## 2016-05-18 ENCOUNTER — Ambulatory Visit
Admission: RE | Admit: 2016-05-18 | Discharge: 2016-05-18 | Disposition: A | Discharge status: Home / Self Care | Payer: Medicare Other | Source: Ambulatory Visit | Attending: General Surgery | Admitting: General Surgery

## 2016-05-18 ENCOUNTER — Ambulatory Visit (HOSPITAL_BASED_OUTPATIENT_CLINIC_OR_DEPARTMENT_OTHER)
Admission: RE | Admit: 2016-05-18 | Discharge: 2016-05-18 | Disposition: A | Discharge status: Home / Self Care | Payer: Medicare Other | Source: Ambulatory Visit | Attending: General Surgery | Admitting: General Surgery

## 2016-05-18 DIAGNOSIS — M1990 Unspecified osteoarthritis, unspecified site: Secondary | ICD-10-CM | POA: Diagnosis not present

## 2016-05-18 DIAGNOSIS — Z79899 Other long term (current) drug therapy: Secondary | ICD-10-CM | POA: Diagnosis not present

## 2016-05-18 DIAGNOSIS — E039 Hypothyroidism, unspecified: Secondary | ICD-10-CM | POA: Insufficient documentation

## 2016-05-18 DIAGNOSIS — Z87891 Personal history of nicotine dependence: Secondary | ICD-10-CM | POA: Diagnosis not present

## 2016-05-18 DIAGNOSIS — N6011 Diffuse cystic mastopathy of right breast: Secondary | ICD-10-CM | POA: Diagnosis not present

## 2016-05-18 DIAGNOSIS — R079 Chest pain, unspecified: Secondary | ICD-10-CM | POA: Diagnosis not present

## 2016-05-18 DIAGNOSIS — G8918 Other acute postprocedural pain: Secondary | ICD-10-CM | POA: Diagnosis not present

## 2016-05-18 DIAGNOSIS — K219 Gastro-esophageal reflux disease without esophagitis: Secondary | ICD-10-CM | POA: Diagnosis not present

## 2016-05-18 DIAGNOSIS — F419 Anxiety disorder, unspecified: Secondary | ICD-10-CM | POA: Insufficient documentation

## 2016-05-18 DIAGNOSIS — E7800 Pure hypercholesterolemia, unspecified: Secondary | ICD-10-CM | POA: Insufficient documentation

## 2016-05-18 DIAGNOSIS — R928 Other abnormal and inconclusive findings on diagnostic imaging of breast: Secondary | ICD-10-CM | POA: Diagnosis not present

## 2016-05-18 DIAGNOSIS — M797 Fibromyalgia: Secondary | ICD-10-CM | POA: Diagnosis not present

## 2016-05-18 DIAGNOSIS — I10 Essential (primary) hypertension: Secondary | ICD-10-CM | POA: Insufficient documentation

## 2016-05-18 DIAGNOSIS — C50911 Malignant neoplasm of unspecified site of right female breast: Secondary | ICD-10-CM | POA: Insufficient documentation

## 2016-05-18 DIAGNOSIS — C50411 Malignant neoplasm of upper-outer quadrant of right female breast: Secondary | ICD-10-CM

## 2016-05-18 HISTORY — PX: BREAST LUMPECTOMY: SHX2

## 2016-05-18 HISTORY — PX: RADIOACTIVE SEED GUIDED PARTIAL MASTECTOMY WITH AXILLARY SENTINEL LYMPH NODE BIOPSY: SHX6520

## 2016-05-18 SURGERY — RADIOACTIVE SEED GUIDED PARTIAL MASTECTOMY WITH AXILLARY SENTINEL LYMPH NODE BIOPSY
Anesthesia: Regional | Site: Breast | Laterality: Right

## 2016-05-18 MED ORDER — DEXAMETHASONE SODIUM PHOSPHATE 4 MG/ML IJ SOLN
INTRAMUSCULAR | Status: DC | PRN
  Administered 2016-05-18: 10 mg via INTRAVENOUS

## 2016-05-18 MED ORDER — FENTANYL CITRATE (PF) 100 MCG/2ML IJ SOLN
INTRAMUSCULAR | Status: AC
  Filled 2016-05-18: qty 2

## 2016-05-18 MED ORDER — SCOPOLAMINE 1 MG/3DAYS TD PT72: 1.0000 | MEDICATED_PATCH | Freq: Once | TRANSDERMAL | Status: DC | PRN

## 2016-05-18 MED ORDER — MIDAZOLAM HCL 2 MG/2ML IJ SOLN
INTRAMUSCULAR | Status: AC
  Filled 2016-05-18: qty 2

## 2016-05-18 MED ORDER — OXYCODONE HCL 5 MG PO TABS
ORAL_TABLET | ORAL | Status: AC
  Filled 2016-05-18: qty 1

## 2016-05-18 MED ORDER — PHENYLEPHRINE HCL 10 MG/ML IJ SOLN
INTRAMUSCULAR | Status: AC
  Filled 2016-05-18: qty 1

## 2016-05-18 MED ORDER — HYDROCODONE-ACETAMINOPHEN 10-325 MG PO TABS: 1.0000 | ORAL_TABLET | Freq: Four times a day (QID) | ORAL | Status: DC | PRN

## 2016-05-18 MED ORDER — LIDOCAINE 2% (20 MG/ML) 5 ML SYRINGE
INTRAMUSCULAR | Status: DC | PRN
  Administered 2016-05-18: 60 mg via INTRAVENOUS

## 2016-05-18 MED ORDER — ONDANSETRON HCL 4 MG/2ML IJ SOLN
INTRAMUSCULAR | Status: AC
  Filled 2016-05-18: qty 2

## 2016-05-18 MED ORDER — FENTANYL CITRATE (PF) 100 MCG/2ML IJ SOLN
50.0000 ug | INTRAMUSCULAR | Status: DC | PRN
  Administered 2016-05-18 (×2): 50 ug via INTRAVENOUS

## 2016-05-18 MED ORDER — CEFAZOLIN SODIUM-DEXTROSE 2-4 GM/100ML-% IV SOLN
2.0000 g | INTRAVENOUS | Status: AC
  Administered 2016-05-18: 2 g via INTRAVENOUS

## 2016-05-18 MED ORDER — EPHEDRINE SULFATE 50 MG/ML IJ SOLN
INTRAMUSCULAR | Status: DC | PRN
  Administered 2016-05-18 (×2): 10 mg via INTRAVENOUS

## 2016-05-18 MED ORDER — TECHNETIUM TC 99M SULFUR COLLOID FILTERED
1.0000 | Freq: Once | INTRAVENOUS | Status: AC | PRN
  Administered 2016-05-18: 1 via INTRADERMAL

## 2016-05-18 MED ORDER — LACTATED RINGERS IV SOLN
INTRAVENOUS | Status: DC
Start: 2016-05-18 — End: 2016-05-18
  Administered 2016-05-18 (×3): via INTRAVENOUS

## 2016-05-18 MED ORDER — BUPIVACAINE HCL (PF) 0.25 % IJ SOLN
INTRAMUSCULAR | Status: DC | PRN
  Administered 2016-05-18: 7 mL

## 2016-05-18 MED ORDER — SODIUM CHLORIDE 0.9 % IJ SOLN
INTRAVENOUS | Status: DC | PRN
  Administered 2016-05-18: 5 mL

## 2016-05-18 MED ORDER — MIDAZOLAM HCL 2 MG/2ML IJ SOLN
1.0000 mg | INTRAMUSCULAR | Status: DC | PRN
  Administered 2016-05-18: 2 mg via INTRAVENOUS

## 2016-05-18 MED ORDER — OXYCODONE HCL 5 MG PO TABS
5.0000 mg | ORAL_TABLET | Freq: Once | ORAL | Status: AC | PRN
  Administered 2016-05-18: 5 mg via ORAL

## 2016-05-18 MED ORDER — OXYCODONE HCL 5 MG/5ML PO SOLN: 5.0000 mg | Freq: Once | ORAL | Status: AC | PRN

## 2016-05-18 MED ORDER — DEXAMETHASONE SODIUM PHOSPHATE 10 MG/ML IJ SOLN
INTRAMUSCULAR | Status: AC
  Filled 2016-05-18: qty 1

## 2016-05-18 MED ORDER — GLYCOPYRROLATE 0.2 MG/ML IJ SOLN: 0.2000 mg | Freq: Once | INTRAMUSCULAR | Status: DC | PRN

## 2016-05-18 MED ORDER — LIDOCAINE 2% (20 MG/ML) 5 ML SYRINGE
INTRAMUSCULAR | Status: AC
  Filled 2016-05-18: qty 5

## 2016-05-18 MED ORDER — ROPIVACAINE HCL 5 MG/ML IJ SOLN
INTRAMUSCULAR | Status: DC | PRN
  Administered 2016-05-18: 25 mL via PERINEURAL

## 2016-05-18 MED ORDER — PROPOFOL 10 MG/ML IV BOLUS
INTRAVENOUS | Status: DC | PRN
  Administered 2016-05-18: 170 mg via INTRAVENOUS

## 2016-05-18 MED ORDER — CEFAZOLIN SODIUM-DEXTROSE 2-4 GM/100ML-% IV SOLN
INTRAVENOUS | Status: AC
  Filled 2016-05-18: qty 100

## 2016-05-18 MED ORDER — ONDANSETRON HCL 4 MG/2ML IJ SOLN
INTRAMUSCULAR | Status: DC | PRN
  Administered 2016-05-18: 4 mg via INTRAVENOUS

## 2016-05-18 MED ORDER — HYDROMORPHONE HCL 1 MG/ML IJ SOLN
0.2500 mg | INTRAMUSCULAR | Status: DC | PRN
Start: 2016-05-18 — End: 2016-05-18
  Administered 2016-05-18 (×2): 0.5 mg via INTRAVENOUS

## 2016-05-18 MED ORDER — PHENYLEPHRINE 40 MCG/ML (10ML) SYRINGE FOR IV PUSH (FOR BLOOD PRESSURE SUPPORT)
PREFILLED_SYRINGE | INTRAVENOUS | Status: AC
  Filled 2016-05-18: qty 10

## 2016-05-18 MED ORDER — HYDROMORPHONE HCL 1 MG/ML IJ SOLN
INTRAMUSCULAR | Status: AC
  Filled 2016-05-18: qty 1

## 2016-05-18 MED ORDER — ONDANSETRON HCL 4 MG/2ML IJ SOLN: 4.0000 mg | Freq: Four times a day (QID) | INTRAMUSCULAR | Status: DC | PRN

## 2016-05-18 SURGICAL SUPPLY — 58 items
APPLIER CLIP 9.375 MED OPEN (MISCELLANEOUS) ×2
BINDER BREAST LRG (GAUZE/BANDAGES/DRESSINGS) IMPLANT
BINDER BREAST MEDIUM (GAUZE/BANDAGES/DRESSINGS) IMPLANT
BINDER BREAST XLRG (GAUZE/BANDAGES/DRESSINGS) ×2 IMPLANT
BINDER BREAST XXLRG (GAUZE/BANDAGES/DRESSINGS) IMPLANT
BLADE SURG 15 STRL LF DISP TIS (BLADE) ×1 IMPLANT
BLADE SURG 15 STRL SS (BLADE) ×1
CANISTER SUC SOCK COL 7IN (MISCELLANEOUS) IMPLANT
CANISTER SUCT 1200ML W/VALVE (MISCELLANEOUS) IMPLANT
CHLORAPREP W/TINT 26ML (MISCELLANEOUS) ×2 IMPLANT
CLIP APPLIE 9.375 MED OPEN (MISCELLANEOUS) ×1 IMPLANT
CLIP TI WIDE RED SMALL 6 (CLIP) ×4 IMPLANT
COVER BACK TABLE 60X90IN (DRAPES) ×2 IMPLANT
COVER MAYO STAND STRL (DRAPES) ×2 IMPLANT
COVER PROBE W GEL 5X96 (DRAPES) ×2 IMPLANT
DECANTER SPIKE VIAL GLASS SM (MISCELLANEOUS) IMPLANT
DEVICE DUBIN W/COMP PLATE 8390 (MISCELLANEOUS) ×2 IMPLANT
DRAPE LAPAROSCOPIC ABDOMINAL (DRAPES) ×2 IMPLANT
DRAPE UTILITY XL STRL (DRAPES) ×2 IMPLANT
ELECT COATED BLADE 2.86 ST (ELECTRODE) ×2 IMPLANT
ELECT REM PT RETURN 9FT ADLT (ELECTROSURGICAL) ×2
ELECTRODE REM PT RTRN 9FT ADLT (ELECTROSURGICAL) ×1 IMPLANT
GLOVE BIO SURGEON STRL SZ7 (GLOVE) ×4 IMPLANT
GLOVE BIOGEL PI IND STRL 7.5 (GLOVE) ×1 IMPLANT
GLOVE BIOGEL PI INDICATOR 7.5 (GLOVE) ×1
GOWN STRL REUS W/ TWL LRG LVL3 (GOWN DISPOSABLE) ×2 IMPLANT
GOWN STRL REUS W/TWL LRG LVL3 (GOWN DISPOSABLE) ×2
HEMOSTAT ARISTA ABSORB 3G PWDR (MISCELLANEOUS) ×2 IMPLANT
ILLUMINATOR WAVEGUIDE N/F (MISCELLANEOUS) IMPLANT
KIT MARKER MARGIN INK (KITS) ×2 IMPLANT
LIGHT WAVEGUIDE WIDE FLAT (MISCELLANEOUS) IMPLANT
LIQUID BAND (GAUZE/BANDAGES/DRESSINGS) ×2 IMPLANT
NDL SAFETY ECLIPSE 18X1.5 (NEEDLE) IMPLANT
NEEDLE HYPO 18GX1.5 SHARP (NEEDLE)
NEEDLE HYPO 25X1 1.5 SAFETY (NEEDLE) ×2 IMPLANT
NS IRRIG 1000ML POUR BTL (IV SOLUTION) IMPLANT
PACK BASIN DAY SURGERY FS (CUSTOM PROCEDURE TRAY) ×2 IMPLANT
PENCIL BUTTON HOLSTER BLD 10FT (ELECTRODE) ×2 IMPLANT
SHEET MEDIUM DRAPE 40X70 STRL (DRAPES) IMPLANT
SLEEVE SCD COMPRESS KNEE MED (MISCELLANEOUS) ×2 IMPLANT
SPONGE LAP 4X18 X RAY DECT (DISPOSABLE) ×6 IMPLANT
STOCKINETTE IMPERVIOUS LG (DRAPES) IMPLANT
STRIP CLOSURE SKIN 1/2X4 (GAUZE/BANDAGES/DRESSINGS) ×2 IMPLANT
SUT ETHILON 2 0 FS 18 (SUTURE) ×2 IMPLANT
SUT MNCRL AB 4-0 PS2 18 (SUTURE) ×2 IMPLANT
SUT MON AB 5-0 PS2 18 (SUTURE) ×2 IMPLANT
SUT SILK 2 0 SH (SUTURE) IMPLANT
SUT VIC AB 2-0 SH 27 (SUTURE) ×2
SUT VIC AB 2-0 SH 27XBRD (SUTURE) ×2 IMPLANT
SUT VIC AB 3-0 SH 27 (SUTURE) ×2
SUT VIC AB 3-0 SH 27X BRD (SUTURE) ×2 IMPLANT
SUT VIC AB 5-0 PS2 18 (SUTURE) IMPLANT
SYR CONTROL 10ML LL (SYRINGE) ×2 IMPLANT
TOWEL OR 17X24 6PK STRL BLUE (TOWEL DISPOSABLE) ×2 IMPLANT
TOWEL OR NON WOVEN STRL DISP B (DISPOSABLE) ×2 IMPLANT
TUBE CONNECTING 20X1/4 (TUBING) IMPLANT
TUBING SUCTION 1/4X6FT (MISCELLANEOUS) ×2 IMPLANT
YANKAUER SUCT BULB TIP NO VENT (SUCTIONS) ×2 IMPLANT

## 2016-05-18 NOTE — Discharge Instructions (Signed)
[facility] °Office Phone Number [phone] ° °POST OP INSTRUCTIONS ° °Always review your discharge instruction sheet given to you by the facility where your surgery was performed. ° °IF YOU HAVE DISABILITY OR FAMILY LEAVE FORMS, YOU MUST BRING THEM TO THE OFFICE FOR PROCESSING.  DO NOT GIVE THEM TO YOUR DOCTOR. ° °1. A prescription for pain medication may be given to you upon discharge.  Take your pain medication as prescribed, if needed.  If narcotic pain medicine is not needed, then you may take acetaminophen (Tylenol), naprosyn (Alleve) or ibuprofen (Advil) as needed. °2. Take your usually prescribed medications unless otherwise directed °3. If you need a refill on your pain medication, please contact your pharmacy.  They will contact our office to request authorization.  Prescriptions will not be filled after 5pm or on week-ends. °4. You should eat very light the first 24 hours after surgery, such as soup, crackers, pudding, etc.  Resume your normal diet the day after surgery. °5. Most patients will experience some swelling and bruising in the breast.  Ice packs and a good support bra will help.  Wear the breast binder provided or a sports bra for 72 hours day and night.  After that wear a sports bra during the day until you return to the office. Swelling and bruising can take several days to resolve.  °6. It is common to experience some constipation if taking pain medication after surgery.  Increasing fluid intake and taking a stool softener will usually help or prevent this problem from occurring.  A mild laxative (Milk of Magnesia or Miralax) should be taken according to package directions if there are no bowel movements after 48 hours. °7. Unless discharge instructions indicate otherwise, you may remove your bandages 48 hours after surgery and you may shower at that time.  You may have steri-strips (small skin tapes) in place directly over the incision.  These strips should be left on the  skin for 7-10 days and will come off on their own.  If your surgeon used skin glue on the incision, you may shower in 24 hours.  The glue will flake off over the next 2-3 weeks.  Any sutures or staples will be removed at the office during your follow-up visit. °8. ACTIVITIES:  You may resume regular daily activities (gradually increasing) beginning the next day.  Wearing a good support bra or sports bra minimizes pain and swelling.  You may have sexual intercourse when it is comfortable. °a. You may drive when you no longer are taking prescription pain medication, you can comfortably wear a seatbelt, and you can safely maneuver your car and apply brakes. °b. RETURN TO WORK:  ______________________________________________________________________________________ °9. You should see your doctor in the office for a follow-up appointment approximately two weeks after your surgery.  Your doctor’s nurse will typically make your follow-up appointment when she calls you with your pathology report.  Expect your pathology report 3-4 business days after your surgery.  You may call to check if you do not hear from us after three days. °10. OTHER INSTRUCTIONS: _______________________________________________________________________________________________ _____________________________________________________________________________________________________________________________________ °_____________________________________________________________________________________________________________________________________ °_____________________________________________________________________________________________________________________________________ ° °WHEN TO CALL DR WAKEFIELD: °1. Fever over 101.0 °2. Nausea and/or vomiting. °3. Extreme swelling or bruising. °4. Continued bleeding from incision. °5. Increased pain, redness, or drainage from the incision. ° °The clinic staff is available to answer your questions during regular  business hours.  Please don’t hesitate to call and ask to speak to one of the nurses for clinical concerns.  If   you have a medical emergency, go to the nearest emergency room or call 911.  A surgeon from [facility] is always on call at the hospital. ° °For further questions, please visit [email or web address] ° ° ° °Post Anesthesia Home Care Instructions ° °Activity: °Get plenty of rest for the remainder of the day. A responsible adult should stay with you for 24 hours following the procedure.  °For the next 24 hours, DO NOT: °-Drive a car °-Operate machinery °-Drink alcoholic beverages °-Take any medication unless instructed by your physician °-Make any legal decisions or sign important papers. ° °Meals: °Start with liquid foods such as gelatin or soup. Progress to regular foods as tolerated. Avoid greasy, spicy, heavy foods. If nausea and/or vomiting occur, drink only clear liquids until the nausea and/or vomiting subsides. Call your physician if vomiting continues. ° °Special Instructions/Symptoms: °Your throat may feel dry or sore from the anesthesia or the breathing tube placed in your throat during surgery. If this causes discomfort, gargle with warm salt water. The discomfort should disappear within 24 hours. ° °If you had a scopolamine patch placed behind your ear for the management of post- operative nausea and/or vomiting: ° °1. The medication in the patch is effective for 72 hours, after which it should be removed.  Wrap patch in a tissue and discard in the trash. Wash hands thoroughly with soap and water. °2. You may remove the patch earlier than 72 hours if you experience unpleasant side effects which may include dry mouth, dizziness or visual disturbances. °3. Avoid touching the patch. Wash your hands with soap and water after contact with the patch. °  ° °

## 2016-05-18 NOTE — H&P (Addendum)
60 yof who presents after screening mm that showed a distortion on the right breast. her density is c. she has by [place] a 7x7x10 mm mass with a negative axilla. she underwent core biopsy that shows a grade II IDC with DCIS that is 100% er/pr pos, her2 negative and Ki is 5%. she has no family history and a prior left breast benign excision in 1986. she is here with her family and has stated she would like to consider bilateral mastectomy. she has no complaints referable to either breast.    Other Problems Conni Slipper, RN; 04/26/2016 8:16 AM) Anxiety Disorder Arthritis Back Pain Bladder Problems Gastroesophageal Reflux Disease Hypercholesterolemia Thyroid Disease  Past Surgical History Conni Slipper, RN; 04/26/2016 8:16 AM) Breast Biopsy Left. Hysterectomy (not due to cancer) - Partial  Diagnostic Studies History Conni Slipper, RN; 04/26/2016 8:16 AM) Colonoscopy 5-10 years ago Mammogram within last year Pap Smear 1-5 years ago  Medication History Conni Slipper, RN; 04/26/2016 8:16 AM) Triazolam (0.25MG Tablet, Oral) Active. HydroCHLOROthiazide (25MG Tablet, Oral) Active. Klor-Con M20 [facility] Tablet ER, Oral) Active. Levothyroxine Sodium (88MCG Tablet, Oral) Active. PrednisoLONE Acetate (1% Suspension, Ophthalmic) Active. RaNITidine HCl (300MG Tablet, Oral) Active. Rosuvastatin Calcium (40MG Tablet, Oral) Active. B Complex-C (Oral) Active. ZyrTEC Allergy (10MG Tablet, Oral) Active. Vitamin D (Cholecalciferol) (1000UNIT Capsule, Oral) Active. Multiple Vitamin (1 (one) Oral) Active. Xanax (0.25MG Tablet, Oral) Active. LamISIL (250MG Tablet, Oral) Active. Medications Reconciled  Social History Conni Slipper, RN; 04/26/2016 8:16 AM) Alcohol use Remotely quit alcohol use. Caffeine use Tea. No drug use Tobacco use Former smoker.  Family History Conni Slipper, RN; 04/26/2016 8:16 AM) Alcohol Abuse Brother. Arthritis Mother. Cerebrovascular Accident  Family Members In General. Diabetes Mellitus Mother. Heart Disease Father, Mother. Heart disease in female family member before age 50 Hypertension Brother. Kidney Disease Mother. Respiratory Condition Brother. Thyroid problems Sister.  Pregnancy / Birth History Conni Slipper, RN; 04/26/2016 8:16 AM) Age at menarche 28 years. Age of menopause 51-55 Contraceptive History Intrauterine device, Oral contraceptives. Gravida 3 Maternal age 17-20 Para 2    Review of Systems Conni Slipper RN; 04/26/2016 8:16 AM) General Present- Fatigue. Not Present- Appetite Loss, Chills, Fever, Night Sweats, Weight Gain and Weight Loss. Skin Not Present- Change in Wart/Mole, Dryness, Hives, Jaundice, New Lesions, Non-Healing Wounds, Rash and Ulcer. HEENT Present- Seasonal Allergies, Sore Throat and Wears glasses/contact lenses. Not Present- Earache, Hearing Loss, Hoarseness, Nose Bleed, Oral Ulcers, Ringing in the Ears, Sinus Pain, Visual Disturbances and Yellow Eyes. Respiratory Not Present- Bloody sputum, Chronic Cough, Difficulty Breathing, Snoring and Wheezing. Breast Not Present- Breast Mass, Breast Pain, Nipple Discharge and Skin Changes. Cardiovascular Present- Rapid Heart Rate. Not Present- Chest Pain, Difficulty Breathing Lying Down, Leg Cramps, Palpitations, Shortness of Breath and Swelling of Extremities. Gastrointestinal Present- Abdominal Pain, Gets full quickly at meals and Nausea. Not Present- Bloating, Bloody Stool, Change in Bowel Habits, Chronic diarrhea, Constipation, Difficulty Swallowing, Excessive gas, Hemorrhoids, Indigestion, Rectal Pain and Vomiting. Female Genitourinary Present- Urgency. Not Present- Frequency, Nocturia, Painful Urination and Pelvic Pain. Musculoskeletal Present- Muscle Weakness. Not Present- Back Pain, Joint Pain, Joint Stiffness, Muscle Pain and Swelling of Extremities. Neurological Not Present- Decreased Memory, Fainting, Headaches, Numbness, Seizures,  Tingling, Tremor, Trouble walking and Weakness. Psychiatric Not Present- Anxiety, Bipolar, Change in Sleep Pattern, Depression, Fearful and Frequent crying. Endocrine Not Present- Cold Intolerance, Excessive Hunger, Hair Changes, Heat Intolerance, Hot flashes and New Diabetes. Hematology Not Present- Easy Bruising, Excessive bleeding, Gland problems, HIV and Persistent Infections.   Physical Exam (  Rolm Bookbinder MD; 04/27/2016 1:15 PM) General Mental Status-Alert. Orientation-Oriented X3.  Chest and Lung Exam Chest and lung exam reveals -on auscultation, normal breath sounds, no adventitious sounds and normal vocal resonance.  Breast Nipples-No Discharge. Breast Lump-No Palpable Breast Mass.  Cardiovascular Cardiovascular examination reveals -normal heart sounds, regular rate and rhythm with no murmurs.  Lymphatic Head & Neck  General Head & Neck Lymphatics: Bilateral - Description - Normal. Axillary  General Axillary Region: Bilateral - Description - Normal. Note: no [place] adenopathy     Assessment & Plan Rolm Bookbinder MD; 04/27/2016 1:20 PM) CANCER OF RIGHT FEMALE BREAST (C50.911) Story: right breast seed guided lumpectomy, right axillary sn biopsy We discussed the staging and pathophysiology of breast cancer. We discussed all of the different options for treatment for breast cancer including surgery, chemotherapy, radiation therapy, Herceptin, and antiestrogen therapy. We discussed a sentinel lymph node biopsy as she does not appear to having lymph node involvement right now. We discussed the performance of that with injection of radioactive tracer. We discussed that there is a chance of having a positive node with a sentinel lymph node biopsy and we will await the permanent pathology to make any other first further decisions in terms of her treatment. We discussed up to a 5% risk lifetime of chronic shoulder pain as well as lymphedema associated with a sentinel  lymph node biopsy. We discussed the options for treatment of the breast cancer which included lumpectomy versus a mastectomy. We discussed the performance of the lumpectomy with radioactive seed placement. We discussed a 5-10% chance of a positive margin requiring reexcision in the operating room. We also discussed that she will likely need radiation therapy (this is usually 5-7 weeks) if she undergoes lumpectomy. The breast cannot undergo more radiation therapy in the same breast after lumpectomy in the future. We discussed the mastectomy (removal of whole breast) and the postoperative care for that as well. Mastectomy can be followed by reconstruction. The decision for lumpectomy vs mastectomy has no impact on decision for chemotherapy. Most mastectomy patients will not need radiation therapy. We discussed that there is no difference in her survival whether she undergoes lumpectomy with radiation therapy or antiestrogen therapy versus a mastectomy. There is also no real difference between her recurrence in the breast. We also discussed reduction We discussed the risks of operation including bleeding, infection, possible reoperation. She understands her further therapy will be based on staging at operation

## 2016-05-18 NOTE — Interval H&P Note (Signed)
History and Physical Interval Note:  05/18/2016 11:32 AM  Desiree Anderson  has presented today for surgery, with the diagnosis of RIGHT BREAST CANCER  The various methods of treatment have been discussed with the patient and family. After consideration of risks, benefits and other options for treatment, the patient has consented to  Procedure(s): RADIOACTIVE SEED GUIDED RIGHT BREAST LUMPECTOMY WITH AXILLARY SENTINEL LYMPH NODE BIOPSY (Right) as a surgical intervention .  [name] history has been reviewed, patient examined, no change in status, stable for surgery.  I have reviewed the patient's chart and labs.  Questions were answered to the patient's satisfaction.     Cassidy Tashiro

## 2016-05-18 NOTE — Anesthesia Postprocedure Evaluation (Signed)
Anesthesia Post Note  Patient: Desiree Anderson  Procedure(s) Performed: Procedure(s) (LRB): RADIOACTIVE SEED GUIDED RIGHT BREAST LUMPECTOMY WITH AXILLARY SENTINEL LYMPH NODE BIOPSY (Right)  Patient location during evaluation: PACU Anesthesia Type: General Level of consciousness: awake and alert and patient cooperative Pain management: pain level controlled Vital Signs Assessment: post-procedure vital signs reviewed and stable Respiratory status: spontaneous breathing and respiratory function stable Cardiovascular status: stable Anesthetic complications: no    Last Vitals:  Filed Vitals:   05/18/16 1345 05/18/16 1400  BP: 108/62 96/48  Pulse: 74 77  Temp:    Resp: 11 19    Last Pain:  Filed Vitals:   05/18/16 1403  PainSc: [place]

## 2016-05-18 NOTE — Transfer of Care (Signed)
Immediate Anesthesia Transfer of Care Note  Patient: Desiree Anderson  Procedure(s) Performed: Procedure(s) with comments: RADIOACTIVE SEED GUIDED RIGHT BREAST LUMPECTOMY WITH AXILLARY SENTINEL LYMPH NODE BIOPSY (Right) - RADIOACTIVE SEED GUIDED RIGHT BREAST LUMPECTOMY WITH AXILLARY SENTINEL LYMPH NODE BIOPSY  Patient Location: PACU  Anesthesia Type:GA combined with regional for post-op pain  Level of Consciousness: awake, alert , oriented and patient cooperative  Airway & Oxygen Therapy: Patient Spontanous Breathing and Patient connected to face mask oxygen  Post-op Assessment: Report given to RN, Post -op Vital signs reviewed and stable and Patient moving all extremities  Post vital signs: Reviewed and stable  Last Vitals:  Filed Vitals:   05/18/16 1135 05/18/16 1140  BP:    Pulse: 88 87  Temp:    Resp: 18 23    Last Pain: There were no vitals filed for this visit.       Complications: No apparent anesthesia complications

## 2016-05-18 NOTE — Anesthesia Procedure Notes (Addendum)
Anesthesia Regional Block:  Pectoralis block  Pre-Anesthetic Checklist: ,, timeout performed, Correct Patient, Correct Site, Correct Laterality, Correct Procedure, Correct Position, site marked, Risks and benefits discussed,  Surgical consent,  Pre-op evaluation,  At surgeon's request and post-op pain management  Laterality: Right  Prep: chloraprep       Needles:  Injection technique: Single-shot  Needle Type: Echogenic Needle     Needle Length: 9cm 9 cm Needle Gauge: 21 and 21 G    Additional Needles:  Procedures: ultrasound guided (picture in chart) Pectoralis block Narrative:  Start time: 05/18/2016 11:08 AM End time: 05/18/2016 11:13 AM Injection made incrementally with aspirations every 5 mL.  Performed by: Personally  Anesthesiologist: HODIERNE, ADAM  Additional Notes: Pt tolerated the procedure well.   Procedure Name: LMA Insertion Date/Time: 05/18/2016 11:51 AM Performed by: Baxter Flattery Pre-anesthesia Checklist: Patient identified, Emergency Drugs available, Suction available and Patient being monitored Patient Re-evaluated:Patient Re-evaluated prior to inductionOxygen Delivery Method: Circle system utilized Preoxygenation: Pre-oxygenation with 100% oxygen Intubation Type: IV induction Ventilation: Mask ventilation without difficulty LMA: LMA inserted LMA Size: 4.0 Number of attempts: 1 Airway Equipment and Method: Bite block Placement Confirmation: positive ETCO2 and breath sounds checked- equal and bilateral Tube secured with: Tape Dental Injury: Teeth and Oropharynx as per pre-operative assessment

## 2016-05-18 NOTE — Anesthesia Preprocedure Evaluation (Signed)
Anesthesia Evaluation  Patient identified by MRN, date of birth, ID band Patient awake    Reviewed: Allergy & Precautions, H&P , NPO status , Patient's Chart, lab work & pertinent test results  Airway Mallampati: II   Neck ROM: full    Dental   Pulmonary former smoker,    breath sounds clear to auscultation       Cardiovascular hypertension,  Rhythm:regular Rate:Normal     Neuro/Psych Anxiety  Neuromuscular disease    GI/Hepatic GERD  ,  Endo/Other  Hypothyroidism   Renal/GU      Musculoskeletal  (+) Arthritis , Fibromyalgia -  Abdominal   Peds  Hematology   Anesthesia Other Findings   Reproductive/Obstetrics                             Anesthesia Physical Anesthesia Plan  ASA: II  Anesthesia Plan: General and Regional   Post-op Pain Management:  Regional for Post-op pain   Induction: Intravenous  Airway Management Planned: LMA  Additional Equipment:   Intra-op Plan:   Post-operative Plan:   Informed Consent: I have reviewed the patients History and Physical, chart, labs and discussed the procedure including the risks, benefits and alternatives for the proposed anesthesia with the patient or authorized representative who has indicated his/her understanding and acceptance.     Plan Discussed with: CRNA, Anesthesiologist and Surgeon  Anesthesia Plan Comments:         Anesthesia Quick Evaluation

## 2016-05-18 NOTE — Op Note (Signed)
Preoperative diagnosis: right breast cancer, clinical stage I Postoperative diagnosis: same as above Procedure: 1. Right breast seed guided lumpectomy 2. Right axillary sn biopsy 3. Injection of blue dye for sentinel node identification Surgeon Dr Serita Grammes Anes general with pectoral block EBL: minimal Comps none Specimen:  1. Right breast marked with paint 2. Right axillary nodes 3. Additional superior margin marked short superior long lateral double deep Sponge count correct at completion dispo to recovery stable  Indications: This is a 67 yof who has newly diagnosed right breast cancer.  We discussed proceeding with lumpectomy/sn biopsy. She had radioactive seed placed prior to beginning. I had these mm in the OR.   Procedure: After informed consent was obtained the patient was taken to the OR. She was injected with technetium in the standard periareolar fashion. She underwent a pectoral block. She was given ancef. SCDs were in place. She was prepped and draped in the standard sterile surgical fashion. A timeout was performed.I could not identify much in the way of technetium in the axilla so I elected to inject methylene blue dye in the periareolar position. I then made a periareolar incision in the right breast.I then removed the seed with an attempt to get a clear margin.  I did confirm removal of the clip and seed with mammography. I had pathology look at specimen grossly and Dr Orene Desanctis thought I was close to the superior margin. I excised more at this site and marked as above..  I placed clips in the cavity. I then closed this with 2-0 vicryl, 3-0 vicryl and 5-0 monocryl. I placed glue on this wound.  I then made an axillary incision I went through the axillary fascia. I was not really able to identify any radioactivity or blue dye.  I was able to identify some very faint signal in the region of a couple of nodes that were palpable but very small. I excised these. I  obtained hemostasis. I then closed the fascia with 2-0 vicryl. I placed arista in the entire cavity. The skin was closed with 3-0 vicryl and 4-0 monocryl. Glue and steristrips were placed A breast binder was placed. She was extubated and transferred to recovery stable

## 2016-05-19 ENCOUNTER — Encounter (HOSPITAL_BASED_OUTPATIENT_CLINIC_OR_DEPARTMENT_OTHER): Payer: Self-pay | Admitting: General Surgery

## 2016-05-19 NOTE — Addendum Note (Signed)
Addendum  created 05/19/16 0815 by Tawni Millers, CRNA   Modules edited: Charges VN

## 2016-05-22 ENCOUNTER — Ambulatory Visit: Payer: Self-pay

## 2016-05-24 ENCOUNTER — Telehealth: Payer: Self-pay | Admitting: *Deleted

## 2016-05-24 NOTE — Telephone Encounter (Signed)
Ordered Oncotype Dx test per Dr. Jana Hakim.  Faxed order to [facility].  Faxed order to [facility] and called Varney Biles to verify she received it.  Faxed order to [facility].  Placed a note to look for results.

## 2016-06-08 ENCOUNTER — Ambulatory Visit: Payer: Medicare Other | Attending: General Surgery | Admitting: Physical Therapy

## 2016-06-08 DIAGNOSIS — M6281 Muscle weakness (generalized): Secondary | ICD-10-CM | POA: Diagnosis not present

## 2016-06-08 DIAGNOSIS — M25611 Stiffness of right shoulder, not elsewhere classified: Secondary | ICD-10-CM | POA: Diagnosis not present

## 2016-06-08 DIAGNOSIS — R293 Abnormal posture: Secondary | ICD-10-CM | POA: Insufficient documentation

## 2016-06-08 DIAGNOSIS — R222 Localized swelling, mass and lump, trunk: Secondary | ICD-10-CM | POA: Insufficient documentation
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
## 2016-06-08 NOTE — Patient Instructions (Signed)
Cane Overhead - Supine  Hold cane at thighs with both hands, extend arms straight over head. Hold ___ seconds. Repeat ___ times. Do ___ times per day.  Lie on back,  put right hand behind hiead, towel roll under elbow and turn head to the left .

## 2016-06-13 NOTE — Progress Notes (Signed)
Location of Breast Cancer: Right Breast  Histology per Pathology Report:  05/18/16 Diagnosis 1. Breast, lumpectomy, Right INVASIVE DUCTAL CARCINOMA, GRADE 1, SPANNING 1.2 CM DUCTAL CARCINOMA IN SITU IS PRESENT DUCTAL CARCINOMA IN SITU IS FOCALLY PRESENT WITHIN 1 MM FROM THE SUPERIOR MARGIN (SEE PART 4) 2. Lymph node, sentinel, biopsy, Right axillary ONE BENIGN LYMPH NODE (0/1) 3. Lymph node, sentinel, biopsy, Right ONE BENIGN LYMPH NODE (0/1) 4. Breast, excision, Right additional superior margin FIBROCYSTIC CHANGES WITH CALCIFICATIONS SCLEROSING ADENOSIS NO EVIDENCE OF MALIGNANCY  Receptor Status: ER(100%), PR (100%), Her2-neu (NEG), Ki-(5%)  Did patient present with symptoms or was this found on screening mammography?: It was found on a screening mammogram.   Past/Anticipated interventions by surgeon, if any: 05/18/16 Procedure: 1. Right breast seed guided lumpectomy 2. Right axillary sn biopsy 3. Injection of blue dye for sentinel node identification Surgeon Dr Serita Grammes  Past/Anticipated interventions by medical oncology, if any:  Dr. Jana Hakim saw in the Breast Clinic 04/26/16 (1) breast conserving surgery with sentinel lymph node sampling pending (2) Oncotype DX will be sent from the definitive surgical samples to help clarify the chemotherapy question (order placed for testing 05/24/16) (3) adjuvant radiation to follow (4) anti-estrogens to follow at the completion of local treatment  Her next appointment with Dr. Jana Hakim is 07/03/16  Lymphedema issues, if any:  She denies swelling. She is receiving PT with Helene Kelp once a week. She feels like these sessions are going well, and helping her.   Pain issues, if any:  She reports soreness to her Right Axilla and center chest area. She states it is improving daily. She has good mobility in her arm, but does use a small pillow to remind her to perform good arm mechanics.   SAFETY ISSUES:  Prior radiation?  No  Pacemaker/ICD? No  Possible current pregnancy? No  Is the patient on methotrexate? No  Current Complaints / other details:     GYNECOLOGIC HISTORY:  No LMP recorded. Patient has had an ablation. Menarche age 12, first live birth age 73,  the patient is [place] P2.  She stopped having periods approximately 2003.  She took hormone replacement approximately 3 years.  She also used birth control pills remotely for approximately 20 years, with no complications.  She has concerns regarding financial issues around copays for treatment. I will have somebody from financial to come talk to her after her appointment today.   BP 126/73   Pulse 84   Temp 97.6 F (36.4 C)   Ht _0  (1.6 m)   Wt 162 lb 3.2 oz (73.6 kg)   SpO2 100% Comment: room air  BMI 28.73 kg/m       Wt Readings from Last 3 Encounters:  06/20/16 162 lb 3.2 oz (73.6 kg)  05/18/16 164 lb (74.4 kg)  04/26/16 165 lb 9.6 oz (75.1 kg)    Emmalene Kattner, Stephani Police, RN 06/13/2016,12:04 PM

## 2016-06-14 ENCOUNTER — Encounter: Payer: Self-pay | Admitting: *Deleted

## 2016-06-15 ENCOUNTER — Ambulatory Visit: Payer: Medicare Other | Admitting: Physical Therapy

## 2016-06-15 DIAGNOSIS — C50911 Malignant neoplasm of unspecified site of right female breast: Secondary | ICD-10-CM | POA: Diagnosis not present

## 2016-06-15 DIAGNOSIS — R222 Localized swelling, mass and lump, trunk: Secondary | ICD-10-CM

## 2016-06-15 DIAGNOSIS — R293 Abnormal posture: Secondary | ICD-10-CM

## 2016-06-15 DIAGNOSIS — M6281 Muscle weakness (generalized): Secondary | ICD-10-CM

## 2016-06-15 DIAGNOSIS — M25611 Stiffness of right shoulder, not elsewhere classified: Secondary | ICD-10-CM

## 2016-06-15 NOTE — Patient Instructions (Signed)
[email or web address] Lymphatic flow series with Shoosh Lettick Crotzer 
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
## 2016-06-19 ENCOUNTER — Ambulatory Visit: Payer: Medicare Other | Admitting: *Deleted

## 2016-06-19 ENCOUNTER — Encounter: Payer: Self-pay | Admitting: *Deleted

## 2016-06-19 VITALS — Ht 63.0 in

## 2016-06-19 DIAGNOSIS — C50411 Malignant neoplasm of upper-outer quadrant of right female breast: Secondary | ICD-10-CM | POA: Diagnosis not present

## 2016-06-19 DIAGNOSIS — E039 Hypothyroidism, unspecified: Secondary | ICD-10-CM

## 2016-06-19 LAB — TSH: TSH: 0.03 m[IU]/L — AB

## 2016-06-19 NOTE — Progress Notes (Signed)
Patient ID: Desiree Anderson, female   DOB: [birth year], 58 y.o.   MRN: [id number] Patient presents for 1 month recheck TSH per Dr. Idell Pickles orders. Patient was advised to change Levothyroxine 88 mcg to 1.5 pills M,T,W and 1 pill times 4 days.

## 2016-06-20 ENCOUNTER — Encounter: Payer: Self-pay | Admitting: Radiation Oncology

## 2016-06-20 ENCOUNTER — Ambulatory Visit
Admission: RE | Admit: 2016-06-20 | Discharge: 2016-06-20 | Disposition: A | Discharge status: Home / Self Care | Payer: Medicare Other | Source: Ambulatory Visit | Attending: Radiation Oncology | Admitting: Radiation Oncology

## 2016-06-20 ENCOUNTER — Telehealth: Payer: Self-pay | Admitting: *Deleted

## 2016-06-20 VITALS — BP 126/73 | HR 84 | Temp 97.6°F | Ht 63.0 in | Wt 162.2 lb

## 2016-06-20 DIAGNOSIS — Z510 Encounter for antineoplastic radiation therapy: Secondary | ICD-10-CM | POA: Diagnosis not present

## 2016-06-20 DIAGNOSIS — C50411 Malignant neoplasm of upper-outer quadrant of right female breast: Secondary | ICD-10-CM | POA: Insufficient documentation

## 2016-06-20 DIAGNOSIS — Z79899 Other long term (current) drug therapy: Secondary | ICD-10-CM | POA: Diagnosis not present

## 2016-06-20 DIAGNOSIS — F17210 Nicotine dependence, cigarettes, uncomplicated: Secondary | ICD-10-CM | POA: Diagnosis not present

## 2016-06-20 DIAGNOSIS — Z170 Estrogen receptor positive status [ER+]: Secondary | ICD-10-CM | POA: Diagnosis not present

## 2016-06-20 NOTE — Telephone Encounter (Signed)
Received oncotype score of 12/8%. Patient aware of results.

## 2016-06-20 NOTE — Progress Notes (Signed)
Radiation Oncology         [phone] ________________________________  Outpatient follow-up  Name: Desiree Anderson MRN: [id number]  Date: 06/20/2016  DOB: 07-Sep-1956  IF:OYDXAJO,INOMVEH DAVID, Desiree Anderson  Rolm Bookbinder, Desiree Anderson   REFERRING PHYSICIAN: Rolm Bookbinder, Desiree Anderson  DIAGNOSIS:    ICD-9-CM ICD-10-CM   1. Breast cancer of upper-outer quadrant of right female breast ([place]) 174.4 C50.411    Stage I [id number] Right Breast  Invasive Ductal Carcinoma, ER positive / PR positive / Her2 negative, Grade 1  HISTORY OF PRESENT ILLNESS::Desiree Anderson is a 60 y.o. female who presented with invasive ductal carcinoma in the upper-outer quadrant of right breast.  Desiree Anderson had routine screening mammography showing a possible area of distortion in the right breast.  On 04/18/2016 she had a diagnostic right mammography with tomography and [facility]  Breast density was category C.  In the upper outer quadrant of the right breast, there was a persistent area of architectural distortion which was not palpable on exam. Ultrasonography confirmed a 1.0cm irregular mass at the 11:00 position 3cm from the nipple.  There was no right axillary lymphadenopathy by ultrasonography.   A biopsy of the right breast mass was obtained same day  and pathology revealed an invasive ductal carcinoma - Estrogen and progesterone receptor both 100% positive with strong staining intensity with an MIB-1 of 5% and no HER-2 amplification, with signal ratios being 1.57, the number per cell 3.85.  Two lymph nodes were also biopsied which were negative for disease.   On 05/18/16 Dr. Donne Hazel performed right breast lumpectomy and sentinel lymph node biopsy.  A 1.2 cm tumor revealed grade 1 invasive ductal carcinoma with DCIS.  Her margins are negative to invasive and insitu disease.  The two lymph nodes were negative.  Oncotype testing was ordered on June 28th.  I don't see the results but I've sent a message  to the breasts navigators to verify.    Of note: the patient suffers from anxiety and depression and had a previous breast lumpectomy in 1985.     PREVIOUS RADIATION THERAPY: No  PAST MEDICAL HISTORY:  has a past medical history of Anxiety; Breast cancer ([place]); DDD (degenerative disc disease), lumbar; Erosion of vaginal mesh ([place]); Fibromyalgia; GERD (gastroesophageal reflux disease); History of gastric ulcer; Hypertension; Hypothyroidism; and Rectocele.    PAST SURGICAL HISTORY: Past Surgical History:  Procedure Laterality Date  . BLADDER TACK  2003  . BREAST LUMPECTOMY  1985  . CARDIOVASCULAR STRESS TEST  07-02-2013  DR DALTON MCLEAN   LOW RISK NUCLEAR STUDY WITH A SMALL, MILD APICAL SEPTAL FIXED PERFUSION DEFECT .  GIVEN NORMAL WALL FUNCTION , SUSPECT REPRESENTS ATTENUATION/  EF 74%/  NO ISCHEMIA  . CATARACT EXTRACTION W/ INTRAOCULAR LENS IMPLANT Left 01/2014  . CYSTO/  HYDRODISTENTION/  BLADDER BX/   INSTILLATION THERAPY  08-29-2010  . EXCISION OF MESH N/A 03/23/2014   Procedure: EXCISION OF VAGINAL AND PERI-URETHRAL MESH, EXTRACTION FROM URETHRA TO APEX, insertion of XENFORM in the vaginal vault;  Surgeon: Ailene Rud, Desiree Anderson;  Location: [facility];  Service: Urology;  Laterality: N/A;  . EXCISION VAGINAL CYST N/A 04/06/2014   Procedure: VAGINAL SPECULUM EXAMINATION/POSSIBLE DRAINAGE OF HEMATOMA;  Surgeon: Ailene Rud, Desiree Anderson;  Location[facility];  Service: Urology;  Laterality: N/A;  . INCONTINENCE SURGERY  2009  &  2005  . RADIOACTIVE SEED GUIDED MASTECTOMY WITH AXILLARY SENTINEL LYMPH NODE BIOPSY Right 05/18/2016   Procedure: RADIOACTIVE  SEED GUIDED RIGHT BREAST LUMPECTOMY WITH AXILLARY SENTINEL LYMPH NODE BIOPSY;  Surgeon: Rolm Bookbinder, Desiree Anderson;  Location: [facility];  Service: General;  Laterality: Right;  RADIOACTIVE SEED GUIDED RIGHT BREAST LUMPECTOMY WITH AXILLARY SENTINEL LYMPH NODE BIOPSY  . TRANSTHORACIC ECHOCARDIOGRAM   06-16-2013   GRADE I DIASTOLIC DYSFUNCTION/  EF 55-60%/  MILD AR/  TRIVIAL MR  &  TR  . VAGINAL HYSTERECTOMY  2000   w/  unilateral salpingoophorectomy  . VEIN BYPASS SURGERY  AGE 46   RIGHT LEG    FAMILY HISTORY: family history includes Lung cancer in her brother.  SOCIAL HISTORY:  reports that she quit smoking about 20 years ago. Her smoking use included Cigarettes. She quit after 20.00 years of use. Her smokeless tobacco use includes Snuff. She reports that she does not drink alcohol or use drugs.  ALLERGIES: Lipitor [atorvastatin]; Shellfish allergy; and Betadine [povidone iodine]  MEDICATIONS:  Current Outpatient Prescriptions  Medication Sig Dispense Refill  . ALPRAZolam (XANAX) 0.5 MG tablet TAKE ONE TABLET BY MOUTH THREE TIMES DAILY AS NEEDED 90 tablet 0  . cetirizine (ZYRTEC) 10 MG tablet Take 10 mg by mouth daily as needed for allergies.    . Cholecalciferol (VITAMIN D-3) 5000 UNITS TABS Take 2 tablets by mouth daily.     Marland Kitchen guaiFENesin (MUCINEX) 600 MG 12 hr tablet Take 600 mg by mouth 2 (two) times daily as needed.    . hydrochlorothiazide (HYDRODIURIL) 25 MG tablet TAKE ONE TABLET BY MOUTH ONCE DAILY 90 tablet 1  . KLOR-CON M20 20 MEQ tablet TAKE ONE TABLET BY MOUTH TWICE DAILY 180 tablet 1  . levothyroxine (SYNTHROID, LEVOTHROID) 88 MCG tablet TAKE ONE TABLET BY MOUTH ONCE DAILY BEFORE BREAKFAST 90 tablet 2  . Multiple Vitamins-Minerals (GERITABS PO) Take 1 tablet by mouth daily. Reported on 06/08/2016    . ranitidine (ZANTAC) 300 MG tablet TAKE ONE TABLET BY MOUTH TWICE DAILY 180 tablet 3  . rosuvastatin (CRESTOR) 40 MG tablet Take 1 tablet (40 mg total) by mouth daily. 90 tablet 1  . terbinafine (LAMISIL) 250 MG tablet Take 1 tablet (250 mg total) by mouth daily. 90 tablet 0  . triazolam (HALCION) 0.25 MG tablet TAKE ONE TABLET BY MOUTH AT BEDTIME 30 tablet 5  . fluticasone (FLONASE) 50 MCG/ACT nasal spray Place 2 sprays into both nostrils daily as needed.     Marland Kitchen  HYDROcodone-acetaminophen (NORCO) 10-325 MG tablet Take 1 tablet by mouth every 6 (six) hours as needed. (Patient not taking: Reported on 06/20/2016) 20 tablet 0   No current facility-administered medications for this encounter.     REVIEW OF SYSTEMS:  Notable for that above.    PHYSICAL EXAM:  height is '5\' 3"'  (1.6 m) and weight is 162 lb 3.2 oz (73.6 kg). Her temperature is 97.6 F (36.4 C). Her blood pressure is 126/73 and her pulse is 84. Her oxygen saturation is 100%.   General: Alert and oriented, in no acute distress HEENT: Head is normocephalic. Extraocular movements are intact. Oropharynx is clear.A thorough oral exam was performed due to history of chewing tobacco.  No sign of tumor in the oral cavity or the oropharynx.   Neck: Neck is supple, no palpable cervical or supraclavicular lymphadenopathy. Heart: Regular in rate and rhythm with no murmurs, rubs, or gallops. Chest: Clear to auscultation bilaterally, with no rhonchi, wheezes, or rales. Abdomen: Soft, nontender, nondistended, with no rigidity or guarding. Extremities: No cyanosis or edema. Lymphatics: see Neck Exam Skin: No concerning  lesions. Musculoskeletal: symmetric strength and muscle tone throughout. Neurologic: Cranial nerves II through XII are grossly intact. No obvious focalities. Speech is fluent. Coordination is intact. Psychiatric: Judgment and insight are intact. Affect is appropriate. BREAST: She has some continued tenderness but signs of significant healing at the right axillary and right lumpectomy scars without any drainage.    ECOG = 0  0 - Asymptomatic (Fully active, able to carry on all predisease activities without restriction)  1 - Symptomatic but completely ambulatory (Restricted in physically strenuous activity but ambulatory and able to carry out work of a light or sedentary nature. For example, light housework, office work)  2 - Symptomatic, <50% in bed during the day (Ambulatory and capable of  all self care but unable to carry out any work activities. Up and about more than 50% of waking hours)  3 - Symptomatic, >50% in bed, but not bedbound (Capable of only limited self-care, confined to bed or chair 50% or more of waking hours)  4 - Bedbound (Completely disabled. Cannot carry on any self-care. Totally confined to bed or chair)  5 - Death   Desiree Anderson, Desiree Anderson, Desiree Anderson, et al. [phone]). "Toxicity and response criteria of the [facility]". [place] Oncol. 5 (6): 649-55   LABORATORY DATA:  Lab Results  Component Value Date   WBC 10.8 (H) 04/26/2016   HGB 13.7 04/26/2016   HCT 41.4 04/26/2016   MCV 82.7 04/26/2016   PLT 302 04/26/2016   CMP     Component Value Date/Time   NA 140 04/26/2016 1224   K 3.5 04/26/2016 1224   CL 100 04/18/2016 1159   CO2 29 04/26/2016 1224   GLUCOSE 98 04/26/2016 1224   BUN 9.7 04/26/2016 1224   CREATININE 0.9 04/26/2016 1224   CALCIUM 9.2 04/26/2016 1224   PROT 7.0 04/26/2016 1224   ALBUMIN 3.7 04/26/2016 1224   AST 18 04/26/2016 1224   ALT 16 04/26/2016 1224   ALKPHOS 83 04/26/2016 1224   BILITOT 0.38 04/26/2016 1224   GFRNONAA >89 04/18/2016 1159   GFRAA >89 04/18/2016 1159         RADIOGRAPHY: No results found.    IMPRESSION/PLAN: Stage I Right breast cancer  It was a pleasure meeting the patient today. We discussed the risks, which includes heart and lung irritation, benefits, and side effect, which include skin irritation of the right chest and swelling of the breast, of radiotherapy. I recommend radiotherapy to the right breast to reduce her risk of locoregional recurrence by 2/3.  We discussed that radiation would take approximately 6-7 weeks to complete and that I would give the patient a few weeks to heal following surgery before starting treatment planning. If chemotherapy were to be given, this would precede radiotherapy. We spoke about acute effects including skin irritation and fatigue as well  as much less common late effects including internal organ injury or irritation. We spoke about the latest technology that is used to minimize the risk of late effects for patients undergoing radiotherapy to the breast or chest wall. No guarantees of treatment were given. The patient is enthusiastic about proceeding with treatment. I look forward to participating in the patient's care.  Note- I was informed by our Breast Navigator at end of consult that her oncotype score is low (12)  and that she will not be receiving chemotherapy. I will ask my staff to schedule her in the near future for CT sim.    The  patient continues to use tobacco. The patient was counseled to stop using tobacco and was offered pharmacotherapy and further counseling to help with this. The patient declined pharmacotherapy and further counseling at this time.  __________________________________________   Eppie Gibson, Desiree Anderson

## 2016-06-20 NOTE — Addendum Note (Signed)
Encounter addended by: Ernst Spell, RN on: 06/20/2016 12:17 PM<BR>    Actions taken: Charge Capture section accepted

## 2016-06-21 ENCOUNTER — Other Ambulatory Visit: Payer: Self-pay | Admitting: Internal Medicine

## 2016-06-21 ENCOUNTER — Encounter (HOSPITAL_COMMUNITY): Payer: Self-pay

## 2016-06-21 DIAGNOSIS — G4700 Insomnia, unspecified: Secondary | ICD-10-CM

## 2016-06-21 MED ORDER — TRIAZOLAM 0.25 MG PO TABS: 0.2500 mg | ORAL_TABLET | Freq: Every day | ORAL | 0 refills | Status: DC

## 2016-06-22 ENCOUNTER — Encounter (HOSPITAL_COMMUNITY): Payer: Self-pay

## 2016-06-28 ENCOUNTER — Ambulatory Visit
Admission: RE | Admit: 2016-06-28 | Discharge: 2016-06-28 | Disposition: A | Discharge status: Home / Self Care | Payer: Medicare Other | Source: Ambulatory Visit | Attending: Radiation Oncology | Admitting: Radiation Oncology

## 2016-06-28 DIAGNOSIS — C50411 Malignant neoplasm of upper-outer quadrant of right female breast: Secondary | ICD-10-CM | POA: Diagnosis not present

## 2016-06-28 DIAGNOSIS — C50911 Malignant neoplasm of unspecified site of right female breast: Secondary | ICD-10-CM | POA: Diagnosis not present

## 2016-06-28 DIAGNOSIS — Z510 Encounter for antineoplastic radiation therapy: Secondary | ICD-10-CM | POA: Diagnosis not present

## 2016-06-28 NOTE — Progress Notes (Signed)
   Radiation Oncology         ([phone] ________________________________  Name: Desiree Anderson MRN: [id number]  Date: 06/28/2016  DOB: [birth year]  SIMULATION AND TREATMENT PLANNING NOTE    outpatient  DIAGNOSIS:     ICD-9-CM ICD-10-CM   1. Breast cancer of upper-outer quadrant of right female breast ([place]) 174.4 C50.411     NARRATIVE:  The patient was brought to the [facility].  Identity was confirmed.  All relevant records and images related to the planned course of therapy were reviewed.  The patient freely provided informed written consent to proceed with treatment after reviewing the details related to the planned course of therapy. The consent form was witnessed and verified by the simulation staff.    Then, the patient was set-up in a stable reproducible supine position for radiation therapy with her ipsilateral arm over her head, and her upper body secured in a custom-made Vac-lok device.  CT images were obtained.  Surface markings were placed.  The CT images were loaded into the planning software.    TREATMENT PLANNING NOTE: Treatment planning then occurred.  The radiation prescription was entered and confirmed.     A total of 3 medically necessary complex treatment devices were fabricated and supervised by me: 2 fields with MLCs for custom blocks to protect heart, and lungs;  and, a Vac-lok. MORE COMPLEX DEVICES MAY BE MADE IN DOSIMETRY FOR FIELD IN FIELD BEAMS FOR DOSE HOMOGENEITY.  I have requested : 3D Simulation  I have requested a DVH of the following structures: lungs, heart, lumpectomy cavity.    The patient will receive 45 Gy in 25 fractions to the right breast with 2 tangential fields.   This will  be followed by a boost.  Optical Surface Tracking Plan:  Since intensity modulated radiotherapy (IMRT) and 3D conformal radiation treatment methods are predicated on accurate and precise positioning for treatment, intrafraction motion monitoring is medically  necessary to ensure accurate and safe treatment delivery. The ability to quantify intrafraction motion without excessive ionizing radiation dose can only be performed with optical surface tracking. Accordingly, surface imaging offers the opportunity to obtain 3D measurements of patient position throughout IMRT and 3D treatments without excessive radiation exposure. I am ordering optical surface tracking for this patient's upcoming course of radiotherapy.  ________________________________   Reference:  Ursula Alert, J, et al. Surface imaging-based analysis of intrafraction motion for breast radiotherapy patients.Journal of [place], n. 6, nov. 2014. ISSN [id number].  Available at: <http://www.jacmp.org/index.php/jacmp/article/view/4957>.    -----------------------------------  Eppie Gibson, MD

## 2016-06-29 ENCOUNTER — Other Ambulatory Visit: Payer: Self-pay | Admitting: *Deleted

## 2016-06-30 ENCOUNTER — Telehealth: Payer: Self-pay | Admitting: Oncology

## 2016-06-30 NOTE — Telephone Encounter (Signed)
per pof to sch pt appt-cld & spoke to pt and gave pt time & date of appt r/sfrom 8/7  to 10/10@11 :30-pt understood

## 2016-07-03 ENCOUNTER — Ambulatory Visit: Payer: Medicare Other | Admitting: Oncology

## 2016-07-04 DIAGNOSIS — Z510 Encounter for antineoplastic radiation therapy: Secondary | ICD-10-CM | POA: Diagnosis not present

## 2016-07-05 ENCOUNTER — Ambulatory Visit
Admission: RE | Admit: 2016-07-05 | Discharge: 2016-07-05 | Disposition: A | Discharge status: Home / Self Care | Payer: Medicare Other | Source: Ambulatory Visit | Attending: Radiation Oncology | Admitting: Radiation Oncology

## 2016-07-05 DIAGNOSIS — Z510 Encounter for antineoplastic radiation therapy: Secondary | ICD-10-CM | POA: Diagnosis not present

## 2016-07-06 ENCOUNTER — Ambulatory Visit
Admission: RE | Admit: 2016-07-06 | Discharge: 2016-07-06 | Disposition: A | Discharge status: Home / Self Care | Payer: Medicare Other | Source: Ambulatory Visit | Attending: Radiation Oncology | Admitting: Radiation Oncology

## 2016-07-06 DIAGNOSIS — Z510 Encounter for antineoplastic radiation therapy: Secondary | ICD-10-CM | POA: Diagnosis not present

## 2016-07-06 DIAGNOSIS — C50411 Malignant neoplasm of upper-outer quadrant of right female breast: Secondary | ICD-10-CM | POA: Diagnosis not present

## 2016-07-07 ENCOUNTER — Ambulatory Visit
Admission: RE | Admit: 2016-07-07 | Discharge: 2016-07-07 | Disposition: A | Discharge status: Home / Self Care | Payer: Medicare Other | Source: Ambulatory Visit | Attending: Radiation Oncology | Admitting: Radiation Oncology

## 2016-07-07 DIAGNOSIS — C50411 Malignant neoplasm of upper-outer quadrant of right female breast: Secondary | ICD-10-CM | POA: Diagnosis not present

## 2016-07-07 DIAGNOSIS — Z510 Encounter for antineoplastic radiation therapy: Secondary | ICD-10-CM | POA: Diagnosis not present

## 2016-07-10 ENCOUNTER — Ambulatory Visit
Admission: RE | Admit: 2016-07-10 | Discharge: 2016-07-10 | Disposition: A | Discharge status: Home / Self Care | Payer: Medicare Other | Source: Ambulatory Visit | Attending: Radiation Oncology | Admitting: Radiation Oncology

## 2016-07-10 ENCOUNTER — Encounter: Payer: Self-pay | Admitting: Radiation Oncology

## 2016-07-10 VITALS — BP 116/64 | HR 77 | Temp 98.5°F | Ht 63.0 in | Wt 161.1 lb

## 2016-07-10 DIAGNOSIS — Z510 Encounter for antineoplastic radiation therapy: Secondary | ICD-10-CM | POA: Diagnosis not present

## 2016-07-10 DIAGNOSIS — C50411 Malignant neoplasm of upper-outer quadrant of right female breast: Secondary | ICD-10-CM

## 2016-07-10 MED ORDER — ALRA NON-METALLIC DEODORANT (RAD-ONC)
1.0000 "application " | Freq: Once | TOPICAL | Status: AC
  Administered 2016-07-10: 1 via TOPICAL

## 2016-07-10 MED ORDER — RADIAPLEXRX EX GEL
Freq: Once | CUTANEOUS | Status: AC
  Administered 2016-07-10: 12:00:00 via TOPICAL

## 2016-07-10 NOTE — Progress Notes (Signed)

## 2016-07-10 NOTE — Progress Notes (Signed)
Desiree Anderson is here for her 3rd fraction of radiation to her Right Breast. She denies pain at this time. She denies fatigue. Her Right Breast is slightly red. She has some moistness under her Right Breast with is chronic for Desiree Anderson related to sweating. She was provided education today, and will begin using her Radiaplex twice daily.   BP 116/64   Pulse 77   Temp 98.5 F (36.9 C)   Ht 5\' 3"  (1.6 m)   Wt 161 lb 1.6 oz (73.1 kg)   BMI 28.54 kg/m

## 2016-07-10 NOTE — Progress Notes (Signed)
   Weekly Management Note:  Outpatient    ICD-9-CM ICD-10-CM   1. Breast cancer of upper-outer quadrant of right female breast ([place]) 174.4 C50.411     Current Dose:  5.4 Gy  Projected Dose: 45 Gy + boost  Narrative:  The patient presents for routine under treatment assessment.  CBCT/MVCT images/Port film x-rays were reviewed.  The chart was checked. Doing well.   Physical Findings:  height is 5\' 3"  (1.6 m) and weight is 161 lb 1.6 oz (73.1 kg). Her temperature is 98.5 F (36.9 C). Her blood pressure is 116/64 and her pulse is 77.   Wt Readings from Last 3 Encounters:  07/10/16 161 lb 1.6 oz (73.1 kg)  06/20/16 162 lb 3.2 oz (73.6 kg)  05/18/16 164 lb (74.4 kg)   Circular erythematous spots at right IM fold - pt reports r/t perspiring   Impression:  The patient is tolerating radiotherapy.  Plan:  Continue radiotherapy as planned. Patient instructed to apply Radiplex to intact skin in treatment fields.  May use cornstarch at IM fold, no Radiaplex there.   ________________________________   Eppie Gibson, M.D.

## 2016-07-11 ENCOUNTER — Ambulatory Visit
Admission: RE | Admit: 2016-07-11 | Discharge: 2016-07-11 | Disposition: A | Discharge status: Home / Self Care | Payer: Medicare Other | Source: Ambulatory Visit | Attending: Radiation Oncology | Admitting: Radiation Oncology

## 2016-07-11 DIAGNOSIS — Z510 Encounter for antineoplastic radiation therapy: Secondary | ICD-10-CM | POA: Diagnosis not present

## 2016-07-12 ENCOUNTER — Ambulatory Visit
Admission: RE | Admit: 2016-07-12 | Discharge: 2016-07-12 | Disposition: A | Discharge status: Home / Self Care | Payer: Medicare Other | Source: Ambulatory Visit | Attending: Radiation Oncology | Admitting: Radiation Oncology

## 2016-07-12 DIAGNOSIS — Z510 Encounter for antineoplastic radiation therapy: Secondary | ICD-10-CM | POA: Diagnosis not present

## 2016-07-13 ENCOUNTER — Ambulatory Visit
Admission: RE | Admit: 2016-07-13 | Discharge: 2016-07-13 | Disposition: A | Discharge status: Home / Self Care | Payer: Medicare Other | Source: Ambulatory Visit | Attending: Radiation Oncology | Admitting: Radiation Oncology

## 2016-07-13 DIAGNOSIS — Z510 Encounter for antineoplastic radiation therapy: Secondary | ICD-10-CM | POA: Diagnosis not present

## 2016-07-14 ENCOUNTER — Ambulatory Visit
Admission: RE | Admit: 2016-07-14 | Discharge: 2016-07-14 | Disposition: A | Discharge status: Home / Self Care | Payer: Medicare Other | Source: Ambulatory Visit | Attending: Radiation Oncology | Admitting: Radiation Oncology

## 2016-07-14 ENCOUNTER — Telehealth: Payer: Self-pay | Admitting: *Deleted

## 2016-07-14 DIAGNOSIS — Z510 Encounter for antineoplastic radiation therapy: Secondary | ICD-10-CM | POA: Diagnosis not present

## 2016-07-14 NOTE — Telephone Encounter (Signed)
  Oncology Nurse Navigator Documentation  Navigator Location: [facility] (07/14/16 1400) Navigator Encounter Type: Telephone (07/14/16 1400) Telephone: Outgoing Call (07/14/16 1400) Abnormal Finding Date: 04/18/16 (07/14/16 1400) Confirmed Diagnosis Date: 04/18/16 (07/14/16 1400) Surgery Date: 05/18/16 (07/14/16 1400) Treatment Initiated Date: 05/18/16 (07/14/16 1400) Patient Visit Type: RadOnc (07/14/16 1400) Treatment Phase: First Radiation Tx (07/14/16 1400)                            Time Spent with Patient: 15 (07/14/16 1400)

## 2016-07-17 ENCOUNTER — Ambulatory Visit
Admission: RE | Admit: 2016-07-17 | Discharge: 2016-07-17 | Disposition: A | Discharge status: Home / Self Care | Payer: Medicare Other | Source: Ambulatory Visit | Attending: Radiation Oncology | Admitting: Radiation Oncology

## 2016-07-17 ENCOUNTER — Encounter: Payer: Self-pay | Admitting: Radiation Oncology

## 2016-07-17 VITALS — BP 133/67 | HR 78 | Temp 98.4°F | Ht 63.0 in | Wt 161.4 lb

## 2016-07-17 DIAGNOSIS — C50411 Malignant neoplasm of upper-outer quadrant of right female breast: Secondary | ICD-10-CM

## 2016-07-17 DIAGNOSIS — Z510 Encounter for antineoplastic radiation therapy: Secondary | ICD-10-CM | POA: Diagnosis not present

## 2016-07-17 NOTE — Progress Notes (Signed)
Desiree Anderson is here for her 8th fraction of radiation to her Right Breast. She denies pain. She has some mild fatigue and tires faster than she used has in the past. Her Right Breast is red. She does have some "red bumps" in between her Breasts which spread slightly to her bilateral Breasts. She tells me this is a heat rash, and will usually use powder to improve it. She asks today if there is anything she can use while receiving radiation. She is feeling well otherwise.  BP 133/67   Pulse 78   Temp 98.4 F (36.9 C)   Ht 5\' 3"  (1.6 m)   Wt 161 lb 6.4 oz (73.2 kg)   BMI 28.59 kg/m

## 2016-07-17 NOTE — Progress Notes (Signed)
   Weekly Management Note:  Outpatient    ICD-9-CM ICD-10-CM   1. Breast cancer of upper-outer quadrant of right female breast ([place]) 174.4 C50.411     Current Dose: 14.4 Gy  Projected Dose: 45 Gy + boost  Narrative:  The patient presents for routine under treatment assessment.  CBCT/MVCT images/Port film x-rays were reviewed.  The chart was checked. Doing well.   Physical Findings:  height is 5\' 3"  (1.6 m) and weight is 161 lb 6.4 oz (73.2 kg). Her temperature is 98.4 F (36.9 C). Her blood pressure is 133/67 and her pulse is 78.   Wt Readings from Last 3 Encounters:  07/17/16 161 lb 6.4 oz (73.2 kg)  07/10/16 161 lb 1.6 oz (73.1 kg)  06/20/16 162 lb 3.2 oz (73.6 kg)   Circular erythematous spots at right IM fold and midline of chest, extending to left breast - pt reports r/t perspiring   Impression:  The patient is tolerating radiotherapy.  Plan:  Continue radiotherapy as planned. Patient instructed to apply Radiplex to intact skin in treatment fields.  Reiterated that she may use cornstarch at IM folds, no Radiaplex there. Neosporin if these spots bleed or ooze.   ________________________________   Eppie Gibson, M.D.

## 2016-07-18 ENCOUNTER — Ambulatory Visit
Admission: RE | Admit: 2016-07-18 | Discharge: 2016-07-18 | Disposition: A | Discharge status: Home / Self Care | Payer: Medicare Other | Source: Ambulatory Visit | Attending: Radiation Oncology | Admitting: Radiation Oncology

## 2016-07-18 DIAGNOSIS — Z510 Encounter for antineoplastic radiation therapy: Secondary | ICD-10-CM | POA: Diagnosis not present

## 2016-07-19 ENCOUNTER — Ambulatory Visit
Admission: RE | Admit: 2016-07-19 | Discharge: 2016-07-19 | Disposition: A | Discharge status: Home / Self Care | Payer: Medicare Other | Source: Ambulatory Visit | Attending: Radiation Oncology | Admitting: Radiation Oncology

## 2016-07-19 DIAGNOSIS — Z510 Encounter for antineoplastic radiation therapy: Secondary | ICD-10-CM | POA: Diagnosis not present

## 2016-07-20 ENCOUNTER — Ambulatory Visit
Admission: RE | Admit: 2016-07-20 | Discharge: 2016-07-20 | Disposition: A | Discharge status: Home / Self Care | Payer: Medicare Other | Source: Ambulatory Visit | Attending: Radiation Oncology | Admitting: Radiation Oncology

## 2016-07-20 DIAGNOSIS — Z510 Encounter for antineoplastic radiation therapy: Secondary | ICD-10-CM | POA: Diagnosis not present

## 2016-07-21 ENCOUNTER — Ambulatory Visit
Admission: RE | Admit: 2016-07-21 | Discharge: 2016-07-21 | Disposition: A | Discharge status: Home / Self Care | Payer: Medicare Other | Source: Ambulatory Visit | Attending: Radiation Oncology | Admitting: Radiation Oncology

## 2016-07-21 DIAGNOSIS — Z510 Encounter for antineoplastic radiation therapy: Secondary | ICD-10-CM | POA: Diagnosis not present

## 2016-07-24 ENCOUNTER — Ambulatory Visit
Admission: RE | Admit: 2016-07-24 | Discharge: 2016-07-24 | Disposition: A | Discharge status: Home / Self Care | Payer: Medicare Other | Source: Ambulatory Visit | Attending: Radiation Oncology | Admitting: Radiation Oncology

## 2016-07-24 ENCOUNTER — Other Ambulatory Visit: Payer: Self-pay | Admitting: *Deleted

## 2016-07-24 ENCOUNTER — Encounter: Payer: Self-pay | Admitting: Radiation Oncology

## 2016-07-24 VITALS — BP 135/56 | HR 77 | Temp 98.3°F | Ht 63.0 in | Wt 162.3 lb

## 2016-07-24 DIAGNOSIS — Z510 Encounter for antineoplastic radiation therapy: Secondary | ICD-10-CM | POA: Diagnosis not present

## 2016-07-24 DIAGNOSIS — C50411 Malignant neoplasm of upper-outer quadrant of right female breast: Secondary | ICD-10-CM

## 2016-07-24 MED ORDER — FLUTICASONE PROPIONATE 50 MCG/ACT NA SUSP: 2.0000 | Freq: Every day | NASAL | 3 refills | Status: DC | PRN

## 2016-07-24 NOTE — Progress Notes (Addendum)
Desiree Anderson has received 13 fractions to her right breast.  Note mile erythema with 2 small, moist area in the inframmary fold.  Currently using cornstarch in this area as instructed.   Denies any pain at this time.  She reports, late afternoon fatigue, but no additional naps.  BP (!) 135/56 (BP Location: Left Arm, Patient Position: Sitting, Cuff Size: Normal)   Pulse 77   Temp 98.3 F (36.8 C) (Oral)   Ht 5\' 3"  (1.6 m)   Wt 162 lb 4.8 oz (73.6 kg)   BMI 28.75 kg/m

## 2016-07-24 NOTE — Progress Notes (Signed)
   Weekly Management Note:  Outpatient    ICD-9-CM ICD-10-CM   1. Breast cancer of upper-outer quadrant of right female breast ([place]) 174.4 C50.411     Current Dose: 23.4 Gy  Projected Dose: 45 Gy + boost  Narrative:  The patient presents for routine under treatment assessment.  CBCT/MVCT images/Port film x-rays were reviewed.  The chart was checked. Doing well.   Physical Findings:  height is 5\' 3"  (1.6 m) and weight is 162 lb 4.8 oz (73.6 kg). Her oral temperature is 98.3 F (36.8 C). Her blood pressure is 135/56 (abnormal) and her pulse is 77.   Wt Readings from Last 3 Encounters:  07/24/16 162 lb 4.8 oz (73.6 kg)  07/17/16 161 lb 6.4 oz (73.2 kg)  07/10/16 161 lb 1.6 oz (73.1 kg)   Circular erythematous spots at right IM fold and midline of chest, extending to left breast - pt reports r/t perspiring --- these are improved.  Impression:  The patient is tolerating radiotherapy.  Plan:  Continue radiotherapy as planned. Patient instructed to apply Radiplex to intact skin in treatment fields.  She may use cornstarch at IM folds, no Radiaplex there. Neosporin if  spots bleed or ooze.   ________________________________   Eppie Gibson, M.D.

## 2016-07-25 ENCOUNTER — Ambulatory Visit
Admission: RE | Admit: 2016-07-25 | Discharge: 2016-07-25 | Disposition: A | Discharge status: Home / Self Care | Payer: Medicare Other | Source: Ambulatory Visit | Attending: Radiation Oncology | Admitting: Radiation Oncology

## 2016-07-25 DIAGNOSIS — Z510 Encounter for antineoplastic radiation therapy: Secondary | ICD-10-CM | POA: Diagnosis not present

## 2016-07-26 ENCOUNTER — Encounter: Payer: Self-pay | Admitting: Radiation Oncology

## 2016-07-26 ENCOUNTER — Ambulatory Visit
Admission: RE | Admit: 2016-07-26 | Discharge: 2016-07-26 | Disposition: A | Discharge status: Home / Self Care | Payer: Medicare Other | Source: Ambulatory Visit | Attending: Radiation Oncology | Admitting: Radiation Oncology

## 2016-07-26 DIAGNOSIS — Z510 Encounter for antineoplastic radiation therapy: Secondary | ICD-10-CM | POA: Diagnosis not present

## 2016-07-27 ENCOUNTER — Ambulatory Visit
Admission: RE | Admit: 2016-07-27 | Discharge: 2016-07-27 | Disposition: A | Discharge status: Home / Self Care | Payer: Medicare Other | Source: Ambulatory Visit | Attending: Radiation Oncology | Admitting: Radiation Oncology

## 2016-07-27 DIAGNOSIS — Z510 Encounter for antineoplastic radiation therapy: Secondary | ICD-10-CM | POA: Diagnosis not present

## 2016-07-28 ENCOUNTER — Ambulatory Visit (INDEPENDENT_AMBULATORY_CARE_PROVIDER_SITE_OTHER): Payer: Medicare Other | Admitting: Internal Medicine

## 2016-07-28 ENCOUNTER — Encounter: Payer: Self-pay | Admitting: Internal Medicine

## 2016-07-28 ENCOUNTER — Ambulatory Visit
Admission: RE | Admit: 2016-07-28 | Discharge: 2016-07-28 | Disposition: A | Discharge status: Home / Self Care | Payer: Medicare Other | Source: Ambulatory Visit | Attending: Radiation Oncology | Admitting: Radiation Oncology

## 2016-07-28 VITALS — HR 80 | Temp 97.8°F | Resp 14 | Ht 63.0 in | Wt 160.0 lb

## 2016-07-28 DIAGNOSIS — C50411 Malignant neoplasm of upper-outer quadrant of right female breast: Secondary | ICD-10-CM

## 2016-07-28 DIAGNOSIS — I10 Essential (primary) hypertension: Secondary | ICD-10-CM

## 2016-07-28 DIAGNOSIS — E039 Hypothyroidism, unspecified: Secondary | ICD-10-CM | POA: Diagnosis not present

## 2016-07-28 DIAGNOSIS — Z6829 Body mass index (BMI) 29.0-29.9, adult: Secondary | ICD-10-CM

## 2016-07-28 DIAGNOSIS — E559 Vitamin D deficiency, unspecified: Secondary | ICD-10-CM

## 2016-07-28 DIAGNOSIS — Z510 Encounter for antineoplastic radiation therapy: Secondary | ICD-10-CM | POA: Diagnosis not present

## 2016-07-28 DIAGNOSIS — E782 Mixed hyperlipidemia: Secondary | ICD-10-CM | POA: Diagnosis not present

## 2016-07-28 DIAGNOSIS — R7303 Prediabetes: Secondary | ICD-10-CM | POA: Diagnosis not present

## 2016-07-28 DIAGNOSIS — Z79899 Other long term (current) drug therapy: Secondary | ICD-10-CM

## 2016-07-28 LAB — LIPID PANEL
Cholesterol: 154 mg/dL (ref 125–200)
HDL: 66 mg/dL (ref >=46)
LDL CALC: 67 mg/dL (ref <130)
Total CHOL/HDL Ratio: 2.3 Ratio (ref <=5.0)
Triglycerides: 105 mg/dL (ref <150)
VLDL: 21 mg/dL (ref <30)

## 2016-07-28 LAB — HEPATIC FUNCTION PANEL
ALK PHOS: 67 U/L (ref 33–130)
ALT: 21 U/L (ref 6–29)
AST: 22 U/L (ref 10–35)
Albumin: 4.1 g/dL (ref 3.6–5.1)
BILIRUBIN INDIRECT: 0.2 mg/dL (ref 0.2–1.2)
Bilirubin, Direct: 0.1 mg/dL (ref <=0.2)
TOTAL PROTEIN: 6.5 g/dL (ref 6.1–8.1)
Total Bilirubin: 0.3 mg/dL (ref 0.2–1.2)

## 2016-07-28 LAB — CBC WITH DIFFERENTIAL/PLATELET
BASOS PCT: 0 %
Basophils Absolute: 0 cells/uL (ref 0–200)
EOS ABS: 84 {cells}/uL (ref 15–500)
Eosinophils Relative: 1 %
HEMATOCRIT: 40.8 % (ref 35.0–45.0)
HEMOGLOBIN: 13.5 g/dL (ref 11.7–15.5)
LYMPHS ABS: 3108 {cells}/uL (ref 850–3900)
LYMPHS PCT: 37 %
MCH: 27.2 pg (ref 27.0–33.0)
MCHC: 33.1 g/dL (ref 32.0–36.0)
MCV: 82.3 fL (ref 80.0–100.0)
MONO ABS: 756 {cells}/uL (ref 200–950)
MPV: 10.3 fL (ref 7.5–12.5)
Monocytes Relative: 9 %
NEUTROS ABS: 4452 {cells}/uL (ref 1500–7800)
Neutrophils Relative %: 53 %
Platelets: 320 10*3/uL (ref 140–400)
RBC: 4.96 MIL/uL (ref 3.80–5.10)
RDW: 13.7 % (ref 11.0–15.0)
WBC: 8.4 10*3/uL (ref 3.8–10.8)

## 2016-07-28 LAB — BASIC METABOLIC PANEL WITH GFR
BUN: 9 mg/dL (ref 7–25)
CHLORIDE: 100 mmol/L (ref 98–110)
CO2: 26 mmol/L (ref 20–31)
Calcium: 9.4 mg/dL (ref 8.6–10.4)
Creat: 0.61 mg/dL (ref 0.50–1.05)
GLUCOSE: 79 mg/dL (ref 65–99)
POTASSIUM: 4.4 mmol/L (ref 3.5–5.3)
Sodium: 137 mmol/L (ref 135–146)

## 2016-07-28 LAB — TSH: TSH: 0.03 mIU/L — ABNORMAL LOW

## 2016-07-28 NOTE — Progress Notes (Signed)
Assessment and Plan:  Hypertension:  -stop HCTZ as BP is very low -keep BP log at home -Continue medication,  -monitor blood pressure at home.  -Continue DASH diet.   -Reminder to go to the ER if any CP, SOB, nausea, dizziness, severe HA, changes vision/speech, left arm numbness and tingling, and jaw pain.  Cholesterol: -Continue diet and exercise.   -check cholesterol  Pre-diabetes: -Continue diet and exercise.  -Check A1C  Vitamin D Def: -continue medications.   Breast cancer -currently undergoing radiation therapy and is tolerating it well. -she has 3 weeks left of treatment  GERD -2 weeks of omeprazole -cont ranitidine BID  Hypothyroidism -cont levothyroxine -TSH  BMI 29 -cont diet and exercise -increase healthy veggies decrease starch intake   Continue diet and meds as discussed. Further disposition pending results of labs.  HPI 60 y.o. female  presents for 3 month follow up with hypertension, hyperlipidemia, prediabetes and vitamin D.   Her blood pressure has been controlled at home, today their BP is  .   She does not workout. She denies chest pain, shortness of breath, dizziness.   She is on cholesterol medication and denies myalgias. Her cholesterol is not at goal. The cholesterol last visit was:   Lab Results  Component Value Date   CHOL 276 (H) 04/18/2016   HDL 58 04/18/2016   LDLCALC 185 (H) 04/18/2016   TRIG 163 (H) 04/18/2016   CHOLHDL 4.8 04/18/2016     She has been working on diet and exercise for prediabetes, and denies foot ulcerations, hyperglycemia, hypoglycemia , increased appetite, nausea, paresthesia of the feet, polydipsia, polyuria, visual disturbances, vomiting and weight loss. Last A1C in the office was:  Lab Results  Component Value Date   HGBA1C 5.8 (H) 04/18/2016    Patient is on Vitamin D supplement.  Lab Results  Component Value Date   VD25OH 66 04/18/2016      She notes that she is undergoing radiation.  She reports  that she is getting really fatigued from the radiation. She reports that she is not having any   She reports that she has had some issues with some reflux.  She reports that she has been eating a lot healthier.  She reports that she has some early satiety.  She feels like she cannot eat a full meal.     Current Medications:  Current Outpatient Prescriptions on File Prior to Visit  Medication Sig Dispense Refill  . ALPRAZolam (XANAX) 0.5 MG tablet TAKE ONE TABLET BY MOUTH THREE TIMES DAILY AS NEEDED 90 tablet 0  . cetirizine (ZYRTEC) 10 MG tablet Take 10 mg by mouth daily as needed for allergies.    . Cholecalciferol (VITAMIN D-3) 5000 UNITS TABS Take 15,000 Units by mouth daily.     Marland Kitchen emollient (RADIAGEL) gel Apply topically as needed for wound care.    . fluticasone (FLONASE) 50 MCG/ACT nasal spray Place 2 sprays into both nostrils daily as needed. 16 g 3  . guaiFENesin (MUCINEX) 600 MG 12 hr tablet Take 600 mg by mouth 2 (two) times daily as needed.    . hydrochlorothiazide (HYDRODIURIL) 25 MG tablet TAKE ONE TABLET BY MOUTH ONCE DAILY 90 tablet 1  . HYDROcodone-acetaminophen (NORCO) 10-325 MG tablet Take 1 tablet by mouth every 6 (six) hours as needed. 20 tablet 0  . KLOR-CON M20 20 MEQ tablet TAKE ONE TABLET BY MOUTH TWICE DAILY 180 tablet 1  . levothyroxine (SYNTHROID, LEVOTHROID) 88 MCG tablet TAKE ONE TABLET BY MOUTH  BEFORE BREAKFAST 90 tablet 0  . Multiple Vitamins-Minerals (GERITABS PO) Take 1 tablet by mouth daily. Reported on [id number]    . non-metallic deodorant Jethro Poling) MISC Apply 1 application topically daily as needed.    . ranitidine (ZANTAC) 300 MG tablet TAKE ONE TABLET BY MOUTH TWICE DAILY 180 tablet 3  . rosuvastatin (CRESTOR) 40 MG tablet Take 1 tablet (40 mg total) by mouth daily. 90 tablet 1  . triazolam (HALCION) 0.25 MG tablet Take 1 tablet (0.25 mg total) by mouth at bedtime. 30 tablet 0   No current facility-administered medications on file prior to visit.      Medical History:  Past Medical History:  Diagnosis Date  . Anxiety   . Breast cancer ([place])   . DDD (degenerative disc disease), lumbar   . Erosion of vaginal mesh ([place])   . Fibromyalgia   . GERD (gastroesophageal reflux disease)   . History of gastric ulcer   . Hypertension   . Hypothyroidism   . Rectocele     Allergies:  Allergies  Allergen Reactions  . Lipitor [Atorvastatin]     Muscle and legs aches  . Shellfish Allergy Diarrhea and Nausea And Vomiting  . Betadine [Povidone Iodine] Rash     Review of Systems:  Review of Systems  Constitutional: Positive for malaise/fatigue. Negative for chills and fever.  HENT: Negative for congestion, ear pain and sore throat.   Eyes: Negative.   Respiratory: Negative for cough, shortness of breath and wheezing.   Cardiovascular: Negative for chest pain, palpitations and leg swelling.  Gastrointestinal: Positive for heartburn. Negative for abdominal pain, blood in stool, constipation, diarrhea and melena.  Genitourinary: Negative.   Skin: Negative.   Neurological: Negative for dizziness, sensory change, loss of consciousness and headaches.  Psychiatric/Behavioral: Negative for depression. The patient is not nervous/anxious and does not have insomnia.     Family history- Review and unchanged  Social history- Review and unchanged  Physical Exam: Pulse 80   Temp 97.8 F (36.6 C) (Temporal)   Resp 14   Ht 5\' 3"  (1.6 m)   Wt 160 lb (72.6 kg)   BMI 28.34 kg/m  Wt Readings from Last 3 Encounters:  07/28/16 160 lb (72.6 kg)  07/24/16 162 lb 4.8 oz (73.6 kg)  07/17/16 161 lb 6.4 oz (73.2 kg)    General Appearance: Well nourished well developed, in no apparent distress. Eyes: PERRLA, EOMs, conjunctiva no swelling or erythema ENT/Mouth: Ear canals normal without obstruction, swelling, erythma, discharge.  TMs normal bilaterally.  Oropharynx moist, clear, without exudate, or postoropharyngeal swelling. Neck: Supple, thyroid  normal,no cervical adenopathy  Respiratory: Respiratory effort normal, Breath sounds clear A&P without rhonchi, wheeze, or rale.  No retractions, no accessory usage. Cardio: RRR with no MRGs. Brisk peripheral pulses without edema.  Abdomen: Soft, + BS,  Non tender, no guarding, rebound, hernias, masses. Musculoskeletal: Full ROM, 5/5 strength, Normal gait Skin: Warm, dry without rashes, lesions, ecchymosis.  Neuro: Awake and oriented X 3, Cranial nerves intact. Normal muscle tone, no cerebellar symptoms. Psych: Normal affect, Insight and Judgment appropriate.    Starlyn Skeans, PA-C 10:57 AM [facility]

## 2016-07-29 ENCOUNTER — Other Ambulatory Visit: Payer: Self-pay | Admitting: Internal Medicine

## 2016-07-29 DIAGNOSIS — G4700 Insomnia, unspecified: Secondary | ICD-10-CM

## 2016-07-29 LAB — HEMOGLOBIN A1C
HEMOGLOBIN A1C: 5.4 % (ref <5.7)
MEAN PLASMA GLUCOSE: 108 mg/dL

## 2016-07-29 MED ORDER — LEVOTHYROXINE SODIUM 50 MCG PO TABS: 50.0000 ug | ORAL_TABLET | Freq: Every day | ORAL | 0 refills | Status: DC

## 2016-07-31 ENCOUNTER — Other Ambulatory Visit: Payer: Self-pay | Admitting: Internal Medicine

## 2016-07-31 DIAGNOSIS — G4700 Insomnia, unspecified: Secondary | ICD-10-CM

## 2016-08-01 ENCOUNTER — Ambulatory Visit
Admission: RE | Admit: 2016-08-01 | Discharge: 2016-08-01 | Disposition: A | Discharge status: Home / Self Care | Payer: Medicare Other | Source: Ambulatory Visit | Attending: Radiation Oncology | Admitting: Radiation Oncology

## 2016-08-01 DIAGNOSIS — Z510 Encounter for antineoplastic radiation therapy: Secondary | ICD-10-CM | POA: Diagnosis not present

## 2016-08-02 ENCOUNTER — Encounter: Payer: Self-pay | Admitting: Radiation Oncology

## 2016-08-02 ENCOUNTER — Ambulatory Visit
Admission: RE | Admit: 2016-08-02 | Discharge: 2016-08-02 | Disposition: A | Discharge status: Home / Self Care | Payer: Medicare Other | Source: Ambulatory Visit | Attending: Radiation Oncology | Admitting: Radiation Oncology

## 2016-08-02 ENCOUNTER — Other Ambulatory Visit: Payer: Self-pay | Admitting: *Deleted

## 2016-08-02 VITALS — BP 119/60 | HR 66 | Temp 97.7°F | Ht 63.0 in | Wt 166.2 lb

## 2016-08-02 DIAGNOSIS — Z510 Encounter for antineoplastic radiation therapy: Secondary | ICD-10-CM | POA: Diagnosis not present

## 2016-08-02 DIAGNOSIS — C50411 Malignant neoplasm of upper-outer quadrant of right female breast: Secondary | ICD-10-CM

## 2016-08-02 MED ORDER — LINACLOTIDE 72 MCG PO CAPS: 72.0000 ug | ORAL_CAPSULE | Freq: Every day | ORAL | 3 refills | Status: DC

## 2016-08-02 NOTE — Progress Notes (Signed)
Desiree Anderson has completed 19 fractions to her right breast.  She denies having pain.  She reports having fatigue in the afternoons.  She is using radiaplex 2-3 times a day.  She reports that her right nipple has been stinging/burning.  She said the radiaplex relieves the burning.  The skin on her right breast is pink with some dryness over the right nipple area.  She has been applying cornstarch under her right breast to help with itching.   BP 119/60 (BP Location: Left Arm, Patient Position: Sitting)   Pulse 66   Temp 97.7 F (36.5 C) (Oral)   Ht 5\' 3"  (1.6 m)   Wt 166 lb 3.2 oz (75.4 kg)   SpO2 100%   BMI 29.44 kg/m    Wt Readings from Last 3 Encounters:  08/02/16 166 lb 3.2 oz (75.4 kg)  07/28/16 160 lb (72.6 kg)  07/24/16 162 lb 4.8 oz (73.6 kg)

## 2016-08-02 NOTE — Progress Notes (Signed)
   Weekly Management Note:  Outpatient    ICD-9-CM ICD-10-CM   1. Breast cancer of upper-outer quadrant of right female breast ([place]) 174.4 C50.411     Current Dose:  34.2 Gy  Projected Dose: 45 Gy + boost  Narrative:  The patient presents for routine under treatment assessment.  CBCT/MVCT images/Port film x-rays were reviewed.  The chart was checked. Doing well. The patient has completed 19 fractions to her right breast. She denies having pain. She reports having fatigue in the afternoons. She states a reduction in her thyroid medication helped alleviate fatigue. She is using Radiaplex 2-3 times a day. She reports that her right nipple has been stinging/burning. She said the Radiaplex relieves the burning. The skin on her right breast is pink with some dryness over the right nipple area. She has been applying cornstarch under her right breast to help with itching.  Physical Findings:  height is 5\' 3"  (1.6 m) and weight is 166 lb 3.2 oz (75.4 kg). Her oral temperature is 97.7 F (36.5 C). Her blood pressure is 119/60 and her pulse is 66. Her oxygen saturation is 100%.   Wt Readings from Last 3 Encounters:  08/02/16 166 lb 3.2 oz (75.4 kg)  07/28/16 160 lb (72.6 kg)  07/24/16 162 lb 4.8 oz (73.6 kg)   Mild hyperpigmentation of right breast and erythema in inframammary folds on right. Skin is intact.  Impression:  The patient is tolerating radiotherapy.  Plan:  Continue radiotherapy as planned. Patient instructed to continue current skin care.   ________________________________   Eppie Gibson, M.D.    This document serves as a record of services personally performed by Eppie Gibson, MD. It was created on her behalf by Bethann Humble, a trained medical scribe. The creation of this record is based on the scribe's personal observations and the provider's statements to them. This document has been checked and approved by the attending provider.

## 2016-08-03 ENCOUNTER — Ambulatory Visit
Admission: RE | Admit: 2016-08-03 | Discharge: 2016-08-03 | Disposition: A | Discharge status: Home / Self Care | Payer: Medicare Other | Source: Ambulatory Visit | Attending: Radiation Oncology | Admitting: Radiation Oncology

## 2016-08-03 DIAGNOSIS — Z510 Encounter for antineoplastic radiation therapy: Secondary | ICD-10-CM | POA: Diagnosis not present

## 2016-08-04 ENCOUNTER — Ambulatory Visit
Admission: RE | Admit: 2016-08-04 | Discharge: 2016-08-04 | Disposition: A | Discharge status: Home / Self Care | Payer: Medicare Other | Source: Ambulatory Visit | Attending: Radiation Oncology | Admitting: Radiation Oncology

## 2016-08-04 DIAGNOSIS — Z510 Encounter for antineoplastic radiation therapy: Secondary | ICD-10-CM | POA: Diagnosis not present

## 2016-08-07 ENCOUNTER — Ambulatory Visit: Payer: Medicare Other | Admitting: Radiation Oncology

## 2016-08-07 ENCOUNTER — Ambulatory Visit
Admission: RE | Admit: 2016-08-07 | Discharge: 2016-08-07 | Disposition: A | Discharge status: Home / Self Care | Payer: Medicare Other | Source: Ambulatory Visit | Attending: Radiation Oncology | Admitting: Radiation Oncology

## 2016-08-07 DIAGNOSIS — Z510 Encounter for antineoplastic radiation therapy: Secondary | ICD-10-CM | POA: Diagnosis not present

## 2016-08-07 DIAGNOSIS — C50411 Malignant neoplasm of upper-outer quadrant of right female breast: Secondary | ICD-10-CM | POA: Diagnosis not present

## 2016-08-08 ENCOUNTER — Ambulatory Visit
Admission: RE | Admit: 2016-08-08 | Discharge: 2016-08-08 | Disposition: A | Discharge status: Home / Self Care | Payer: Medicare Other | Source: Ambulatory Visit | Attending: Radiation Oncology | Admitting: Radiation Oncology

## 2016-08-08 DIAGNOSIS — Z510 Encounter for antineoplastic radiation therapy: Secondary | ICD-10-CM | POA: Diagnosis not present

## 2016-08-09 ENCOUNTER — Ambulatory Visit
Admission: RE | Admit: 2016-08-09 | Discharge: 2016-08-09 | Disposition: A | Discharge status: Home / Self Care | Payer: Medicare Other | Source: Ambulatory Visit | Attending: Radiation Oncology | Admitting: Radiation Oncology

## 2016-08-09 ENCOUNTER — Encounter: Payer: Self-pay | Admitting: Radiation Oncology

## 2016-08-09 VITALS — BP 113/69 | HR 65 | Temp 97.9°F | Ht 63.0 in | Wt 164.5 lb

## 2016-08-09 DIAGNOSIS — Z510 Encounter for antineoplastic radiation therapy: Secondary | ICD-10-CM | POA: Diagnosis not present

## 2016-08-09 DIAGNOSIS — C50411 Malignant neoplasm of upper-outer quadrant of right female breast: Secondary | ICD-10-CM

## 2016-08-09 NOTE — Progress Notes (Signed)
   Weekly Management Note:  Outpatient    ICD-9-CM ICD-10-CM   1. Breast cancer of upper-outer quadrant of right female breast ([facility]) 174.4 C50.411     Current Dose:  43.2 Gy  Projected Dose: 45 Gy + boost  Narrative:  The patient presents for routine under treatment assessment.  CBCT/MVCT images/Port film x-rays were reviewed.  The chart was checked. Doing well.   Desiree Anderson presents for her 55 th fraction of radiation to her right breast. She reports pain in her right breast a 6/10 this morning and it will increase throughout the day. She does have occasional sharp pains as well. She does Have some fatigue and will nap in the afternoon daily. Her right breast is red and swollen. She has peeling underneath the right breast. She tells me she has noticed moisture in that area, but she is unsure if it is drainage or perspiration. She has been using cornstarch to this area, but plans to stop and use neosporin. The nurse gave her telfa pads and neosporin samples today. She is using Radiaplex twice daily.  Physical Findings:  height is 5\' 3"  (1.6 m) and weight is 164 lb 8 oz (74.6 kg). Her temperature is 97.9 F (36.6 C). Her blood pressure is 113/69 and her pulse is 65.   Wt Readings from Last 3 Encounters:  08/09/16 164 lb 8 oz (74.6 kg)  08/02/16 166 lb 3.2 oz (75.4 kg)  07/28/16 160 lb (72.6 kg)   Early moist desquamation at R IM fold.  Right breast is erythematous  Impression:  The patient is tolerating radiotherapy.  Plan:  Continue radiotherapy as planned. Patient instructed to stop cornstarch and apply neosporin/telfa pads to IM fold. Continue radiaplex over upper/central breast   ________________________________   Eppie Gibson, M.D.    This document serves as a record of services personally performed by Eppie Gibson, MD. It was created on her behalf by Bethann Humble, a trained medical scribe. The creation of this record is based on the scribe's personal observations and the  provider's statements to them. This document has been checked and approved by the attending provider.

## 2016-08-09 NOTE — Progress Notes (Signed)
Desiree Anderson presents for her 24th fraction of radiation to her Right Breast. She reports pain in her Right Breast a 6/10 this morning and it will increase throughout the day. She does have occasional sharp pains as well. She does have some fatigue and will nap in the afternoon daily. Her Right Breast is red and swollen. She has peeling underneath her Right Breast. She tells me she has noticed moisture in that area, but is unsure if it is drainage or perspiration. She has been using cornstarch to this area, but plans to stop and use neosporin. I gave her some telfa pads today and neosporin samples.  She is using the Radiaplex twice daily.   BP 113/69   Pulse 65   Temp 97.9 F (36.6 C)   Ht 5\' 3"  (1.6 m)   Wt 164 lb 8 oz (74.6 kg)   BMI 29.14 kg/m    Wt Readings from Last 3 Encounters:  08/09/16 164 lb 8 oz (74.6 kg)  08/02/16 166 lb 3.2 oz (75.4 kg)  07/28/16 160 lb (72.6 kg)

## 2016-08-10 ENCOUNTER — Ambulatory Visit
Admission: RE | Admit: 2016-08-10 | Discharge: 2016-08-10 | Disposition: A | Discharge status: Home / Self Care | Payer: Medicare Other | Source: Ambulatory Visit | Attending: Radiation Oncology | Admitting: Radiation Oncology

## 2016-08-10 DIAGNOSIS — Z510 Encounter for antineoplastic radiation therapy: Secondary | ICD-10-CM | POA: Diagnosis not present

## 2016-08-11 ENCOUNTER — Ambulatory Visit
Admission: RE | Admit: 2016-08-11 | Discharge: 2016-08-11 | Disposition: A | Discharge status: Home / Self Care | Payer: Medicare Other | Source: Ambulatory Visit | Attending: Radiation Oncology | Admitting: Radiation Oncology

## 2016-08-11 DIAGNOSIS — Z510 Encounter for antineoplastic radiation therapy: Secondary | ICD-10-CM | POA: Diagnosis not present

## 2016-08-14 ENCOUNTER — Ambulatory Visit
Admission: RE | Admit: 2016-08-14 | Discharge: 2016-08-14 | Disposition: A | Discharge status: Home / Self Care | Payer: Medicare Other | Source: Ambulatory Visit | Attending: Radiation Oncology | Admitting: Radiation Oncology

## 2016-08-14 ENCOUNTER — Encounter: Payer: Self-pay | Admitting: Radiation Oncology

## 2016-08-14 VITALS — BP 130/68 | HR 77 | Temp 98.1°F | Resp 16 | Ht 63.0 in | Wt 167.1 lb

## 2016-08-14 DIAGNOSIS — C50411 Malignant neoplasm of upper-outer quadrant of right female breast: Secondary | ICD-10-CM | POA: Insufficient documentation

## 2016-08-14 DIAGNOSIS — Z510 Encounter for antineoplastic radiation therapy: Secondary | ICD-10-CM | POA: Diagnosis not present

## 2016-08-14 MED ORDER — RADIAPLEXRX EX GEL
Freq: Once | CUTANEOUS | Status: AC
  Administered 2016-08-14: 12:00:00 via TOPICAL

## 2016-08-14 NOTE — Progress Notes (Signed)
   Weekly Management Note:  Outpatient    ICD-9-CM ICD-10-CM   1. Breast cancer of upper-outer quadrant of right female breast ([facility]) 174.4 C50.411 hyaluronate sodium (RADIAPLEXRX) gel    Current Dose:  49 Gy  Projected Dose: 45 Gy + boost  Narrative:  The patient presents for routine under treatment assessment.  CBCT/MVCT images/Port film x-rays were reviewed.  The chart was checked.   She reports pain in her Right Breast a 6/10 this morning and it will increase throughout the day. She does have occasional sharp pains as well. She does have some fatigue and will nap in the afternoon daily. Her Right Breast is red and swollen. She has peeling underneath her Right Breast, moist desquamation under the inframamany fold, using telfa pads.  Reports that she had some  drainage under her breast, using neosporin samples. She is using the Radiaplex twice daily.   Physical Findings:  height is 5\' 3"  (1.6 m) and weight is 167 lb 1.6 oz (75.8 kg). Her oral temperature is 98.1 F (36.7 C). Her blood pressure is 130/68 and her pulse is 77. Her respiration is 16 and oxygen saturation is 99%.   Wt Readings from Last 3 Encounters:  08/14/16 167 lb 1.6 oz (75.8 kg)  08/09/16 164 lb 8 oz (74.6 kg)  08/02/16 166 lb 3.2 oz (75.4 kg)   Progressive moist desquamation at R IM fold but modest drainage.  Right breast is erythematous  Impression:  The patient is tolerating radiotherapy.  Plan:  Continue radiotherapy as planned. Continue to apply neosporin/telfa pads to IM fold. Continue radiaplex over upper/central breast   ________________________________   Eppie Gibson, M.D.    This document serves as a record of services personally performed by Eppie Gibson, MD. It was created on her behalf by Bethann Humble, a trained medical scribe. The creation of this record is based on the scribe's personal observations and the provider's statements to them. This document has been checked and approved by the attending  provider.

## 2016-08-14 NOTE — Progress Notes (Signed)
Desiree Anderson presents for her 27th fraction of radiation to her Right Breast. She reports pain in her Right Breast a 6/10 this morning and it will increase throughout the day. She does have occasional sharp pains as well. She does have some fatigue and will nap in the afternoon daily. Her Right Breast is red and swollen. She has peeling underneath her Right Breast, moist desquamation under the inframamany fold, using telfa pads.  Reports that she had some  drainage under her breast, using neosporin samples. She is using the Radiaplex twice daily.  Wt Readings from Last 3 Encounters:  08/14/16 167 lb 1.6 oz (75.8 kg)  08/09/16 164 lb 8 oz (74.6 kg)  08/02/16 166 lb 3.2 oz (75.4 kg)  BP 130/68 (BP Location: Left Arm, Patient Position: Sitting, Cuff Size: Normal)   Pulse 77   Temp 98.1 F (36.7 C) (Oral)   Resp 16   Ht 5\' 3"  (1.6 m)   Wt 167 lb 1.6 oz (75.8 kg)   SpO2 99%   BMI 29.60 kg/m

## 2016-08-15 ENCOUNTER — Ambulatory Visit
Admission: RE | Admit: 2016-08-15 | Discharge: 2016-08-15 | Disposition: A | Discharge status: Home / Self Care | Payer: Medicare Other | Source: Ambulatory Visit | Attending: Radiation Oncology | Admitting: Radiation Oncology

## 2016-08-15 DIAGNOSIS — Z510 Encounter for antineoplastic radiation therapy: Secondary | ICD-10-CM | POA: Diagnosis not present

## 2016-08-16 ENCOUNTER — Ambulatory Visit
Admission: RE | Admit: 2016-08-16 | Discharge: 2016-08-16 | Disposition: A | Discharge status: Home / Self Care | Payer: Medicare Other | Source: Ambulatory Visit | Attending: Radiation Oncology | Admitting: Radiation Oncology

## 2016-08-16 DIAGNOSIS — Z510 Encounter for antineoplastic radiation therapy: Secondary | ICD-10-CM | POA: Diagnosis not present

## 2016-08-17 ENCOUNTER — Ambulatory Visit
Admission: RE | Admit: 2016-08-17 | Discharge: 2016-08-17 | Disposition: A | Discharge status: Home / Self Care | Payer: Medicare Other | Source: Ambulatory Visit | Attending: Radiation Oncology | Admitting: Radiation Oncology

## 2016-08-17 DIAGNOSIS — Z510 Encounter for antineoplastic radiation therapy: Secondary | ICD-10-CM | POA: Diagnosis not present

## 2016-08-18 ENCOUNTER — Ambulatory Visit
Admission: RE | Admit: 2016-08-18 | Discharge: 2016-08-18 | Disposition: A | Discharge status: Home / Self Care | Payer: Medicare Other | Source: Ambulatory Visit | Attending: Radiation Oncology | Admitting: Radiation Oncology

## 2016-08-18 DIAGNOSIS — Z510 Encounter for antineoplastic radiation therapy: Secondary | ICD-10-CM | POA: Diagnosis not present

## 2016-08-21 ENCOUNTER — Ambulatory Visit
Admission: RE | Admit: 2016-08-21 | Discharge: 2016-08-21 | Disposition: A | Discharge status: Home / Self Care | Payer: Medicare Other | Source: Ambulatory Visit | Attending: Radiation Oncology | Admitting: Radiation Oncology

## 2016-08-21 ENCOUNTER — Encounter: Payer: Self-pay | Admitting: Radiation Oncology

## 2016-08-21 VITALS — BP 128/79 | HR 68 | Temp 97.9°F | Ht 63.0 in | Wt 167.2 lb

## 2016-08-21 DIAGNOSIS — Z510 Encounter for antineoplastic radiation therapy: Secondary | ICD-10-CM | POA: Diagnosis not present

## 2016-08-21 DIAGNOSIS — C50411 Malignant neoplasm of upper-outer quadrant of right female breast: Secondary | ICD-10-CM

## 2016-08-21 NOTE — Progress Notes (Addendum)
Ms. Pantel has received 32 fractions to her right breast.  She reports intermittent stinging, level 4 at this time, of her breast, associated with intermittent swelling in the areola region with whelps.  Note bright erythema and resolving blister in the areola region at this time without drainage.  She also reports that she is not experiencing as much fatigue at this point in her tx phase, but naps as needed.   BP 128/79 (BP Location: Left Arm, Patient Position: Sitting, Cuff Size: Normal)   Pulse 68   Temp 97.9 F (36.6 C) (Oral)   Ht 5\' 3"  (1.6 m)   Wt 167 lb 3.2 oz (75.8 kg)   BMI 29.62 kg/m    Wt Readings from Last 3 Encounters:  08/21/16 167 lb 3.2 oz (75.8 kg)  08/14/16 167 lb 1.6 oz (75.8 kg)  08/09/16 164 lb 8 oz (74.6 kg)

## 2016-08-21 NOTE — Progress Notes (Signed)
   Weekly Management Note:  Outpatient    ICD-9-CM ICD-10-CM   1. Breast cancer of upper-outer quadrant of right female breast ([place]) 174.4 C50.411     Current Dose:  59 Gy  Projected Dose: 61Gy  Narrative:  The patient presents for routine under treatment assessment.  CBCT/MVCT images/Port film x-rays were reviewed.  The chart was checked.  Doing well. Skin is healing at IM fold well.    Physical Findings:  height is 5\' 3"  (1.6 m) and weight is 167 lb 3.2 oz (75.8 kg). Her oral temperature is 97.9 F (36.6 C). Her blood pressure is 128/79 and her pulse is 68.   Wt Readings from Last 3 Encounters:  08/21/16 167 lb 3.2 oz (75.8 kg)  08/14/16 167 lb 1.6 oz (75.8 kg)  08/09/16 164 lb 8 oz (74.6 kg)   Regenerated skin at IM fold, dry.  Right breast is erythematous  Impression:  The patient is tolerating radiotherapy.  Plan:  Continue radiotherapy as planned. Continue to apply neosporin/telfa pads to IM fold. Continue radiaplex over upper/central breast . F/u in 49mo.   ________________________________   Eppie Gibson, M.D.    This document serves as a record of services personally performed by Eppie Gibson, MD. It was created on her behalf by Bethann Humble, a trained medical scribe. The creation of this record is based on the scribe's personal observations and the provider's statements to them. This document has been checked and approved by the attending provider.

## 2016-08-22 ENCOUNTER — Ambulatory Visit
Admission: RE | Admit: 2016-08-22 | Discharge: 2016-08-22 | Disposition: A | Discharge status: Home / Self Care | Payer: Medicare Other | Source: Ambulatory Visit | Attending: Radiation Oncology | Admitting: Radiation Oncology

## 2016-08-22 ENCOUNTER — Encounter: Payer: Self-pay | Admitting: Radiation Oncology

## 2016-08-22 DIAGNOSIS — Z510 Encounter for antineoplastic radiation therapy: Secondary | ICD-10-CM | POA: Diagnosis not present

## 2016-08-23 ENCOUNTER — Other Ambulatory Visit: Payer: Self-pay | Admitting: *Deleted

## 2016-08-23 ENCOUNTER — Telehealth: Payer: Self-pay | Admitting: *Deleted

## 2016-08-23 DIAGNOSIS — C50411 Malignant neoplasm of upper-outer quadrant of right female breast: Secondary | ICD-10-CM

## 2016-08-23 NOTE — Telephone Encounter (Signed)
  Oncology Nurse Navigator Documentation  Navigator Location: [facility] (08/23/16 1400) Navigator Encounter Type: Telephone (08/23/16 1400) Telephone: [place] Call (08/23/16 1400)         Patient Visit Type: [id number] (08/23/16 1400) Treatment Phase: Final Radiation Tx (08/23/16 1400)                            Time Spent with Patient: 15 (08/23/16 1400)

## 2016-08-28 ENCOUNTER — Ambulatory Visit (INDEPENDENT_AMBULATORY_CARE_PROVIDER_SITE_OTHER): Payer: Medicare Other | Admitting: *Deleted

## 2016-08-28 DIAGNOSIS — R7989 Other specified abnormal findings of blood chemistry: Secondary | ICD-10-CM

## 2016-08-28 DIAGNOSIS — Z23 Encounter for immunization: Secondary | ICD-10-CM | POA: Diagnosis not present

## 2016-08-28 DIAGNOSIS — R7303 Prediabetes: Secondary | ICD-10-CM

## 2016-08-28 DIAGNOSIS — R946 Abnormal results of thyroid function studies: Secondary | ICD-10-CM | POA: Diagnosis not present

## 2016-08-28 LAB — TSH: TSH: 4.97 mIU/L — ABNORMAL HIGH

## 2016-08-28 NOTE — Progress Notes (Signed)
Patient presents for 1 month f/u recheck TSH. Patient currently taking Levothyroxine 50 mcg times 5 days and 25 mcg times 2 days per week.

## 2016-08-29 NOTE — Progress Notes (Signed)
  Radiation Oncology         [phone] ________________________________  Name: Desiree Anderson MRN: [id number]  Date: 08/22/2016  DOB: September 20, 1956  End of Treatment Note  DIAGNOSIS:     ICD-9-CM ICD-10-CM   1. Breast cancer of upper-outer quadrant of right female breast ([place]) 174.4 C50.411    Indication for treatment:  curative  Radiation treatment dates:   07/06/2016-08/22/2016  Site/dose:   1) right breast / 45 Gy in 25 fractions, 2) right breast boost / 16 Gy in 8 fractions  Beams/energy:   1) 3D / 10X ;  2) En face / 18 MeV  Narrative: The patient tolerated radiation treatment relatively well.      Plan: The patient has completed radiation treatment. The patient will return to radiation oncology clinic for routine followup in one month. I advised them to call or return sooner if they have any questions or concerns related to their recovery or treatment.  -----------------------------------  Eppie Gibson, MD

## 2016-08-30 ENCOUNTER — Other Ambulatory Visit: Payer: Self-pay

## 2016-08-31 ENCOUNTER — Other Ambulatory Visit: Payer: Self-pay | Admitting: Internal Medicine

## 2016-08-31 DIAGNOSIS — G4700 Insomnia, unspecified: Secondary | ICD-10-CM

## 2016-09-05 ENCOUNTER — Ambulatory Visit (HOSPITAL_BASED_OUTPATIENT_CLINIC_OR_DEPARTMENT_OTHER): Payer: Medicare Other | Admitting: Oncology

## 2016-09-05 VITALS — BP 131/68 | HR 67 | Temp 98.1°F | Resp 18 | Ht 63.0 in | Wt 167.2 lb

## 2016-09-05 DIAGNOSIS — C50411 Malignant neoplasm of upper-outer quadrant of right female breast: Secondary | ICD-10-CM

## 2016-09-05 DIAGNOSIS — Z170 Estrogen receptor positive status [ER+]: Secondary | ICD-10-CM

## 2016-09-05 MED ORDER — TAMOXIFEN CITRATE 20 MG PO TABS: 20.0000 mg | ORAL_TABLET | Freq: Every day | ORAL | 12 refills | Status: AC

## 2016-09-05 NOTE — Progress Notes (Signed)
[facility]  [phone] [phone]     ID: Desiree Anderson DOB: 16-Aug-1956  MR#: [id number]  CLE#:[id number]  Patient Care Team: Desiree Pinto, MD as PCP - General (Internal Medicine) Desiree Records, MD as Consulting Physician (Cardiology) Desiree Killings, MD as Consulting Physician (Orthopedic Surgery) Desiree Clines, MD as Consulting Physician (Urology) Desiree Kick, MD as Consulting Physician (Obstetrics and Gynecology) Desiree Cruel, MD as Consulting Physician (Oncology) Desiree Bookbinder, MD as Consulting Physician (General Surgery) Desiree Silversmith, MD as Consulting Physician (Radiation Oncology) PCP: Desiree Richards, MD GYN: OTHER MD:  CHIEF COMPLAINT: Estrogen receptor positive invasive breast cancer  CURRENT TREATMENT: Tamoxifen   BREAST CANCER HISTORY: From the original intake note:  Desiree Anderson had routine screening mammography showing a possible area of distortion in the right breast. On 04/18/2016 she had diagnostic right mammography with tomography and right breast ultrasonography at the [facility]. Breast density was category C. In the upper outer quadrant of the right breast there was a persistent area of architectural distortion which was not palpable on exam. Ultrasonography confirmed a 1.0 cm irregular mass at the 11:00 position 3 cm from the nipple. There was no right axillary lymphadenopathy by ultrasonography.  Biopsy of the right breast mass in question was obtained the same day and showed (S [id number]) an invasive ductal carcinoma, grade 2, estrogen and progesterone receptor both 100% positive with strong staining intensity, with an MIB-1 of 5%, and no HER-2 amplification, the signals ratio being 1.57, the number per cell 3.85.  Her subsequent history is as detailed below  INTERVAL HISTORY: Desiree Anderson returns today for follow-up of her estrogen receptor positive breast cancer. On 05/18/2016 she underwent her definitive  right lumpectomy with sentinel lymph node sampling. This confirmed a 1.2 cm invasive ductal carcinoma, grade 1, with both sentinel lymph nodes clear. Margins were negative. Repeat HER-2 was again negative with a signals ratio of 0.64, the number per cell being 1.15.  We obtained an Oncotype on this material. A score of 12 predicted at 10 year risk of recurrence outside the breast of 8% of the patient's only systemic therapy is tamoxifen for 5 years. It also predicted benefit from chemotherapy. s  Accordingly she proceeded directly to radiation, which she received between August 10 and 08/22/2016. She is here today to discuss anti-estrogen therapy  REVIEW OF SYSTEMS: She had significant peeling and blistering from the radiation, she tells me and it was quite painful. It still hurts some but she is massaging it and it is improving. She also has significant fatigue and ended up taking naps most days. That also is fading. She exercises chiefly by walking. Aside from those issues, a detailed review of systems today was noncontributory.  PAST MEDICAL HISTORY: Past Medical History:  Diagnosis Date  . Anxiety   . Breast cancer ([place])   . DDD (degenerative disc disease), lumbar   . Erosion of vaginal mesh ([place])   . Fibromyalgia   . GERD (gastroesophageal reflux disease)   . History of gastric ulcer   . Hypertension   . Hypothyroidism   . Rectocele     PAST SURGICAL HISTORY: Past Surgical History:  Procedure Laterality Date  . BLADDER TACK  2003  . BREAST LUMPECTOMY  1985  . CARDIOVASCULAR STRESS TEST  07-02-2013  DR Desiree Anderson   LOW RISK NUCLEAR STUDY WITH A SMALL, MILD APICAL SEPTAL FIXED PERFUSION DEFECT .  GIVEN NORMAL WALL FUNCTION , SUSPECT REPRESENTS ATTENUATION/  EF 74%/  NO  ISCHEMIA  . CATARACT EXTRACTION W/ INTRAOCULAR LENS IMPLANT Left 01/2014  . CYSTO/  HYDRODISTENTION/  BLADDER BX/   INSTILLATION THERAPY  08-29-2010  . EXCISION OF MESH N/A 03/23/2014   Procedure: EXCISION OF  VAGINAL AND PERI-URETHRAL MESH, EXTRACTION FROM URETHRA TO APEX, insertion of XENFORM in the vaginal vault;  Surgeon: Desiree Rud, MD;  [facility];  Service: Urology;  Laterality: N/A;  . EXCISION VAGINAL CYST N/A 04/06/2014   Procedure: VAGINAL SPECULUM EXAMINATION/POSSIBLE DRAINAGE OF HEMATOMA;  Surgeon: Desiree Rud, MD;  Location: [facility];  Service: Urology;  Laterality: N/A;  . INCONTINENCE SURGERY  2009  &  2005  . RADIOACTIVE SEED GUIDED MASTECTOMY WITH AXILLARY SENTINEL LYMPH NODE BIOPSY Right 05/18/2016   Procedure: RADIOACTIVE SEED GUIDED RIGHT BREAST LUMPECTOMY WITH AXILLARY SENTINEL LYMPH NODE BIOPSY;  Surgeon: Desiree Bookbinder, MD;  Location: [facility];  Service: General;  Laterality: Right;  RADIOACTIVE SEED GUIDED RIGHT BREAST LUMPECTOMY WITH AXILLARY SENTINEL LYMPH NODE BIOPSY  . TRANSTHORACIC ECHOCARDIOGRAM  06-16-2013   GRADE I DIASTOLIC DYSFUNCTION/  EF 55-60%/  MILD AR/  TRIVIAL MR  &  TR  . VAGINAL HYSTERECTOMY  2000   w/  unilateral salpingoophorectomy  . VEIN BYPASS SURGERY  AGE 75   RIGHT LEG    FAMILY HISTORY Family History  Problem Relation Age of Onset  . Lung cancer Brother   [name] father died at the age of 59 from a myocardial infarction. He was not a smoker however. [name] mother died with renal failure at the age of 38. The patient had 4 brothers, 3 sisters. The only cancer in the family was one brother diagnosed with lung cancer at the age of 74. There is no history of breast or ovarian cancer in the family.  GYNECOLOGIC HISTORY:  No LMP recorded. Patient has had an ablation. Menarche age 47, first live birth age 64, the patient is [place] P2. She stopped having periods approximately 2003. She took hormone replacement approximately 3 years. She also used birth control pills remotely for approximately 20 years, with no complications.  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 Her husband Desiree Anderson family business, produce an plants, in [employer], for 17 years. He is now retired. Desiree Anderson lives in [place] and is a homemaker. Son Carlynn Spry Goins lives in [place] and has multiple health problems. The patient has 2 granddaughters. She also has 2 grandchildren who have died. She is a Furniture conservator/restorer.    ADVANCED DIRECTIVES: Not in place   HEALTH MAINTENANCE: Social History  Substance Use Topics  . Smoking status: Former Smoker    Years: 20.00    Types: Cigarettes    Quit date: 11/28/1995  . Smokeless tobacco: Current User    Types: Snuff     Comment: dips daily  . Alcohol use No     Colonoscopy: 2007/ [facility]  PAP:  Bone density: 2012/ [facility]  Lipid panel:  Allergies  Allergen Reactions  . Lipitor [Atorvastatin]     Muscle and legs aches  . Shellfish Allergy Diarrhea and Nausea And Vomiting  . Betadine [Povidone Iodine] Rash    Current Outpatient Prescriptions  Medication Sig Dispense Refill  . acidophilus (RISAQUAD) CAPS capsule Take 1 capsule by mouth daily. One capsule twice a day    . ALPRAZolam Duanne Moron)  0.5 MG tablet TAKE ONE TABLET BY MOUTH THREE TIMES DAILY AS NEEDED 90 tablet 0  . cetirizine (ZYRTEC) 10 MG tablet Take 10 mg by mouth daily as needed for allergies.    . Cholecalciferol (VITAMIN D-3) 5000 UNITS TABS Take 15,000 Units by mouth daily.     Marland Kitchen emollient (RADIAGEL) gel Apply topically as needed for wound care.    . fluticasone (FLONASE) 50 MCG/ACT nasal spray Place 2 sprays into both nostrils daily as needed. 16 g 3  . guaiFENesin (MUCINEX) 600 MG 12 hr tablet Take 600 mg by mouth 2 (two) times daily as needed.    Marland Kitchen KLOR-CON M20 20 MEQ tablet TAKE ONE TABLET BY MOUTH TWICE DAILY 180 tablet 1  . levothyroxine (SYNTHROID, LEVOTHROID) 50 MCG tablet Take 1 tablet (50 mcg total) by mouth daily before breakfast. 90 tablet 0  . Multiple  Vitamins-Minerals (GERITABS PO) Take 1 tablet by mouth daily. Reported on 06/08/2016    . omeprazole (PRILOSEC OTC) 20 MG tablet Take 20 mg by mouth daily.    . ranitidine (ZANTAC) 300 MG tablet TAKE ONE TABLET BY MOUTH TWICE DAILY 180 tablet 3  . rosuvastatin (CRESTOR) 40 MG tablet Take 1 tablet (40 mg total) by mouth daily. 90 tablet 1  . tamoxifen (NOLVADEX) 20 MG tablet Take 1 tablet (20 mg total) by mouth daily. 90 tablet 12  . triazolam (HALCION) 0.25 MG tablet TAKE ONE TABLET BY MOUTH AT BEDTIME 30 tablet 0   No current facility-administered medications for this visit.     OBJECTIVE: Middle-aged white woman Who appears stated age  63:   09/05/16 1126  BP: 131/68  Pulse: 67  Resp: 18  Temp: 98.1 F (36.7 C)     Body mass index is 29.62 kg/m.    ECOG FS:1 - Symptomatic but completely ambulatory  Sclerae unicteric, pupils round and equal Oropharynx clear and moist-- no thrush or other lesions No cervical or supraclavicular adenopathy Lungs no rales or rhonchi Heart regular rate and rhythm Abd soft, nontender, positive bowel sounds MSK no focal spinal tenderness, no upper extremity lymphedema Neuro: nonfocal, well oriented, appropriate affect Breasts: The right breast is status post lumpectomy and radiation. The incisions have healed nicely. There is still an area of mild erythema around the areola and superiorly. It is still slightly healed. In the inferior part of the breast there are some expected skin thickening. Resume evidence of disease recurrence. The left axilla is benign. He the the eighth of    LAB RESULTS:  CMP     Component Value Date/Time   NA 137 07/28/2016 1101   NA 140 04/26/2016 1224   K 4.4 07/28/2016 1101   K 3.5 04/26/2016 1224   CL 100 07/28/2016 1101   CO2 26 07/28/2016 1101   CO2 29 04/26/2016 1224   GLUCOSE 79 07/28/2016 1101   GLUCOSE 98 04/26/2016 1224   BUN 9 07/28/2016 1101   BUN 9.7 04/26/2016 1224   CREATININE 0.61 07/28/2016 1101    CREATININE 0.9 04/26/2016 1224   CALCIUM 9.4 07/28/2016 1101   CALCIUM 9.2 04/26/2016 1224   PROT 6.5 07/28/2016 1101   PROT 7.0 04/26/2016 1224   ALBUMIN 4.1 07/28/2016 1101   ALBUMIN 3.7 04/26/2016 1224   AST 22 07/28/2016 1101   AST 18 04/26/2016 1224   ALT 21 07/28/2016 1101   ALT 16 04/26/2016 1224   ALKPHOS 67 07/28/2016 1101   ALKPHOS 83 04/26/2016 1224   BILITOT 0.3 07/28/2016 1101  BILITOT 0.38 04/26/2016 1224   GFRNONAA >89 07/28/2016 1101   GFRAA >89 07/28/2016 1101    INo results found for: SPEP, UPEP  Lab Results  Component Value Date   WBC 8.4 07/28/2016   NEUTROABS 4,452 07/28/2016   HGB 13.5 07/28/2016   HCT 40.8 07/28/2016   MCV 82.3 07/28/2016   PLT 320 07/28/2016      Chemistry      Component Value Date/Time   NA 137 07/28/2016 1101   NA 140 04/26/2016 1224   K 4.4 07/28/2016 1101   K 3.5 04/26/2016 1224   CL 100 07/28/2016 1101   CO2 26 07/28/2016 1101   CO2 29 04/26/2016 1224   BUN 9 07/28/2016 1101   BUN 9.7 04/26/2016 1224   CREATININE 0.61 07/28/2016 1101   CREATININE 0.9 04/26/2016 1224      Component Value Date/Time   CALCIUM 9.4 07/28/2016 1101   CALCIUM 9.2 04/26/2016 1224   ALKPHOS 67 07/28/2016 1101   ALKPHOS 83 04/26/2016 1224   AST 22 07/28/2016 1101   AST 18 04/26/2016 1224   ALT 21 07/28/2016 1101   ALT 16 04/26/2016 1224   BILITOT 0.3 07/28/2016 1101   BILITOT 0.38 04/26/2016 1224       No results found for: LABCA2  No components found for: LABCA125  No results for input(s): INR in the last 168 hours.  Urinalysis    Component Value Date/Time   COLORURINE YELLOW 10/11/2015 1628   APPEARANCEUR CLEAR 10/11/2015 1628   LABSPEC 1.007 10/11/2015 1628   PHURINE 6.0 10/11/2015 1628   GLUCOSEU NEGATIVE 10/11/2015 1628   HGBUR NEGATIVE 10/11/2015 1628   BILIRUBINUR NEGATIVE 10/11/2015 1628   KETONESUR NEGATIVE 10/11/2015 1628   PROTEINUR NEGATIVE 10/11/2015 1628   NITRITE NEGATIVE 10/11/2015 1628    LEUKOCYTESUR NEGATIVE 10/11/2015 1628      ELIGIBLE FOR AVAILABLE RESEARCH PROTOCOL: no  STUDIES: No results found.  ASSESSMENT: 60 y.o. [place] woman status post right breast upper outer quadrant biopsy 04/18/2016 for a clinical T1b N0, stage IA invasive ductal carcinoma, grade 2, estrogen and progesterone receptor strongly positive, HER-2 nonamplified, with an MIB-1 of 5%.   (1) Status post right lumpectomy and sentinel lymph node sampling 05/18/2016 for a pT1c pN0, stage IA invasive ductal carcinoma, grade 1, with negative margins, Repeat HER-2 again negative  (2) Oncotype DX score of 12 predicts a 10 year risk of recurrence outside the breast of 8% the patient's only systemic therapy is tamoxifen for 5 years. It also protects no benefit from adjuvant chemotherapy. He  (3) adjuvant radiation 07/06/2016-08/22/2016  (4) to start tamoxifen 09/27/2016  PLAN: Shiah survived her radiation treatments but it will take a little bit longer for her to more fully recover. Accordingly we are not starting anti-estrogens until 09/27/2016.  '@PATIENTFIRSTNAME' @   has completed her local treatment and is now ready to start anti-estrogens.  We discussed the difference between tamoxifen and anastrozole in detail. She understands that anastrozole and the aromatase inhibitors in general work by blocking estrogen production. Accordingly vaginal dryness, decrease in bone density, and of course hot flashes can result. The aromatase inhibitors can also negatively affect the cholesterol profile, although that is a minor effect. One out of 5 women on aromatase inhibitors we will feel "old and achy". This arthralgia/myalgia syndrome, which resembles fibromyalgia clinically, does resolve with stopping the medications. Accordingly this is not a reason to not try an aromatase inhibitor but it is a frequent reason to stop it (in other  words 20% of women will not be able to tolerate these medications).  Tamoxifen on  the other hand does not block estrogen production. It does not "take away a woman's estrogen". It blocks the estrogen receptor in breast cells. Like anastrozole, it can also cause hot flashes. As opposed to anastrozole, tamoxifen has many estrogen-like effects. It is technically an estrogen receptor modulator. This means that in some tissues tamoxifen works like estrogen-- for example it helps strengthen the bones. It tends to improve the cholesterol profile. It can cause thickening of the endometrial lining, and even endometrial polyps or rarely cancer of the uterus.(The risk of uterine cancer due to tamoxifen is one additional cancer per thousand women year). It can cause vaginal wetness or stickiness. It can cause blood clots through this estrogen-like effect--the risk of blood clots with tamoxifen is exactly the same as with birth control pills or hormone replacement.  Neither of these agents causes mood changes or weight gain, despite the popular belief that they can have these side effects. We have data from studies comparing either of these drugs with placebo, and in those cases the control group had the same amount of weight gain and depression as the group that took the drug.  After much discussion was she decided to do well as to go for tamoxifen. She is deterred by the aches and pains problem with anastrozole and the possibility of thinning bones.  She is going to see me in January. If she is tolerating the tamoxifen well and the plan will be to continue that for a minimum of 5 years. Otherwise we will consider aromatase inhibitors  She knows to call for any problems that may develop before her next visit.  Desiree Cruel, MD   09/05/2016 7:48 PM Medical Oncology and Hematology [facility] [address] [place], [place] [address] Tel. [phone]    Fax. [phone]

## 2016-09-20 NOTE — Progress Notes (Signed)
Electron Holiday representative Note Breast cancer of upper-outer quadrant of right female breast ([place]) 174.4 C50.411    OUTPATIENT  [name] CT images from her initial simulation were reviewed to plan her boost treatment to her right breast  lumpectomy cavity.  Measurements were made regarding the size and depth of the surgical bed. The boost to the lumpectomy cavity will be delivered with 18 MeV electrons; 16 Gy in 8 fractions has been prescribed to the 100% isodose line.   A special port plan plan was reviewed and approved.  A custom electron cut-out will be used for her boost field.    -----------------------------------  Eppie Gibson, MD

## 2016-09-21 ENCOUNTER — Other Ambulatory Visit: Payer: Self-pay | Admitting: Internal Medicine

## 2016-09-21 ENCOUNTER — Encounter: Payer: Self-pay | Admitting: Radiation Oncology

## 2016-09-27 ENCOUNTER — Ambulatory Visit
Admission: RE | Admit: 2016-09-27 | Discharge: 2016-09-27 | Disposition: A | Discharge status: Home / Self Care | Payer: Medicare Other | Source: Ambulatory Visit | Attending: Radiation Oncology | Admitting: Radiation Oncology

## 2016-09-27 ENCOUNTER — Encounter: Payer: Self-pay | Admitting: Radiation Oncology

## 2016-09-27 DIAGNOSIS — C50411 Malignant neoplasm of upper-outer quadrant of right female breast: Secondary | ICD-10-CM | POA: Diagnosis not present

## 2016-09-27 DIAGNOSIS — Z170 Estrogen receptor positive status [ER+]: Secondary | ICD-10-CM

## 2016-09-27 HISTORY — DX: Personal history of irradiation: Z92.3

## 2016-09-27 NOTE — Progress Notes (Signed)
Desiree Anderson presents for follow up of radiation completed 08/22/16 to her Right Breast. She denies pain. She has mild fatigue, but has improved since completing radiation. She started Tamoxifen today. She has a survivorship appointment on 10/27/16. She reports some soreness when extending her Right arm. It will sting and burn for a while. She will sit down for a while and perform exercises, and the pain improves. Her Right Breast has healed. She has a small area of hyperpigmentation above her Right Nipple. She is using Vitamin E oil over this area.   BP (!) 124/57   Pulse 85   Temp 98.1 F (36.7 C)   Ht 5\' 3"  (1.6 m)   Wt 168 lb 12.8 oz (76.6 kg)   SpO2 100% Comment: room air  BMI 29.90 kg/m    Wt Readings from Last 3 Encounters:  09/27/16 168 lb 12.8 oz (76.6 kg)  09/05/16 167 lb 3.2 oz (75.8 kg)  08/21/16 167 lb 3.2 oz (75.8 kg)

## 2016-09-27 NOTE — Progress Notes (Signed)
Radiation Oncology         [phone] ________________________________  Name: Desiree Anderson MRN: [id number]  Date: 09/27/2016  DOB: 1956/07/22  Follow-Up Visit Note  Outpatient  CC: Alesia Richards, MD  Rolm Bookbinder, MD  Diagnosis and Prior Radiotherapy:    ICD-9-CM ICD-10-CM   1. Malignant neoplasm of upper-outer quadrant of right breast in female, estrogen receptor positive ([place]) 174.4 C50.411    V86.0 Z17.0      Radiation treatment dates:   07/06/2016-08/22/2016  Site/dose:   1) right breast / 45 Gy in 25 fractions, 2) right breast boost / 16 Gy in 8 fractions  Narrative:  The patient returns today for routine follow-up.  Doing well,  Skin healing with Vit E oil.  Started Tamoxifen today.                              ALLERGIES:  is allergic to lipitor [atorvastatin]; shellfish allergy; and betadine [povidone iodine].  Meds: Current Outpatient Prescriptions  Medication Sig Dispense Refill  . ALPRAZolam (XANAX) 0.5 MG tablet TAKE ONE TABLET BY MOUTH THREE TIMES DAILY AS NEEDED 90 tablet 0  . cetirizine (ZYRTEC) 10 MG tablet Take 10 mg by mouth daily as needed for allergies.    . Cholecalciferol (VITAMIN D-3) 5000 UNITS TABS Take 15,000 Units by mouth daily.     . fluticasone (FLONASE) 50 MCG/ACT nasal spray Place 2 sprays into both nostrils daily as needed. 16 g 3  . guaiFENesin (MUCINEX) 600 MG 12 hr tablet Take 600 mg by mouth 2 (two) times daily as needed.    Marland Kitchen KLOR-CON M20 20 MEQ tablet TAKE ONE TABLET BY MOUTH TWICE DAILY 180 tablet 1  . levothyroxine (SYNTHROID, LEVOTHROID) 50 MCG tablet Take 1 tablet (50 mcg total) by mouth daily before breakfast. 90 tablet 0  . Multiple Vitamins-Minerals (GERITABS PO) Take 1 tablet by mouth daily. Reported on 06/08/2016    . ranitidine (ZANTAC) 300 MG tablet TAKE ONE TABLET BY MOUTH TWICE DAILY 180 tablet 3  . rosuvastatin (CRESTOR) 40 MG tablet Take 1 tablet (40 mg total) by mouth daily. 90 tablet 1  . tamoxifen (NOLVADEX)  20 MG tablet Take 1 tablet (20 mg total) by mouth daily. 90 tablet 12  . triazolam (HALCION) 0.25 MG tablet TAKE ONE TABLET BY MOUTH AT BEDTIME 30 tablet 0  . UNABLE TO FIND Take by mouth daily. Med Name: Femdophilus. probiotic     No current facility-administered medications for this encounter.     Physical Findings: The patient is in no acute distress. Patient is alert and oriented.  height is 5\' 3"  (1.6 m) and weight is 168 lb 12.8 oz (76.6 kg). Her temperature is 98.1 F (36.7 C). Her blood pressure is 124/57 (abnormal) and her pulse is 85. Her oxygen saturation is 100%. .    Right breast has healed well - skin is slightly hyperpigmented.  Skin soft, intact.  Lab Findings: Lab Results  Component Value Date   WBC 8.4 07/28/2016   HGB 13.5 07/28/2016   HCT 40.8 07/28/2016   MCV 82.3 07/28/2016   PLT 320 07/28/2016    Radiographic Findings: No results found.  Impression/Plan:She has healed well post - RT. Continue applying Vit E oil for at least 2 mo. I encouraged her to continue with yearly mammography and followup with medical oncology. I will see her back on an as-needed basis. I have encouraged her to call if she  has any issues or concerns in the future. I wished her the very best.  _____________________________________   Eppie Gibson, MD This document serves as a record of services personally performed by Eppie Gibson, MD. It was created on her behalf by Bethann Humble, a trained medical scribe. The creation of this record is based on the scribe's personal observations and the provider's statements to them. This document has been checked and approved by the attending provider.

## 2016-10-05 ENCOUNTER — Other Ambulatory Visit: Payer: Self-pay | Admitting: Internal Medicine

## 2016-10-05 DIAGNOSIS — G4700 Insomnia, unspecified: Secondary | ICD-10-CM

## 2016-10-06 ENCOUNTER — Other Ambulatory Visit: Payer: Self-pay | Admitting: Internal Medicine

## 2016-10-06 DIAGNOSIS — G4700 Insomnia, unspecified: Secondary | ICD-10-CM

## 2016-10-27 ENCOUNTER — Ambulatory Visit (HOSPITAL_BASED_OUTPATIENT_CLINIC_OR_DEPARTMENT_OTHER): Payer: Medicare Other | Admitting: Adult Health

## 2016-10-27 ENCOUNTER — Encounter: Payer: Self-pay | Admitting: Adult Health

## 2016-10-27 VITALS — BP 141/71 | HR 73 | Temp 97.8°F | Resp 18 | Wt 167.4 lb

## 2016-10-27 DIAGNOSIS — Z79811 Long term (current) use of aromatase inhibitors: Secondary | ICD-10-CM

## 2016-10-27 DIAGNOSIS — Z170 Estrogen receptor positive status [ER+]: Secondary | ICD-10-CM | POA: Diagnosis not present

## 2016-10-27 DIAGNOSIS — Z720 Tobacco use: Secondary | ICD-10-CM | POA: Diagnosis not present

## 2016-10-27 DIAGNOSIS — C50411 Malignant neoplasm of upper-outer quadrant of right female breast: Secondary | ICD-10-CM | POA: Diagnosis not present
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
## 2016-10-30 ENCOUNTER — Other Ambulatory Visit: Payer: Self-pay | Admitting: Internal Medicine

## 2016-10-30 DIAGNOSIS — G4700 Insomnia, unspecified: Secondary | ICD-10-CM

## 2016-11-10 ENCOUNTER — Other Ambulatory Visit: Payer: Self-pay | Admitting: Internal Medicine

## 2016-11-10 DIAGNOSIS — G4700 Insomnia, unspecified: Secondary | ICD-10-CM

## 2016-11-10 NOTE — Telephone Encounter (Signed)
Please call Triazolam

## 2016-11-15 ENCOUNTER — Encounter: Payer: Self-pay | Admitting: Internal Medicine

## 2016-11-15 ENCOUNTER — Ambulatory Visit: Payer: Medicare Other | Admitting: Internal Medicine

## 2016-11-15 VITALS — BP 130/84 | HR 88 | Temp 97.5°F | Resp 16 | Ht 62.5 in | Wt 165.4 lb

## 2016-11-15 DIAGNOSIS — I10 Essential (primary) hypertension: Secondary | ICD-10-CM | POA: Diagnosis not present

## 2016-11-15 DIAGNOSIS — Z0000 Encounter for general adult medical examination without abnormal findings: Secondary | ICD-10-CM | POA: Diagnosis not present

## 2016-11-15 DIAGNOSIS — Z136 Encounter for screening for cardiovascular disorders: Secondary | ICD-10-CM

## 2016-11-15 DIAGNOSIS — E039 Hypothyroidism, unspecified: Secondary | ICD-10-CM

## 2016-11-15 DIAGNOSIS — M797 Fibromyalgia: Secondary | ICD-10-CM

## 2016-11-15 DIAGNOSIS — M5136 Other intervertebral disc degeneration, lumbar region: Secondary | ICD-10-CM

## 2016-11-15 DIAGNOSIS — Z79899 Other long term (current) drug therapy: Secondary | ICD-10-CM

## 2016-11-15 DIAGNOSIS — R7303 Prediabetes: Secondary | ICD-10-CM

## 2016-11-15 DIAGNOSIS — E559 Vitamin D deficiency, unspecified: Secondary | ICD-10-CM

## 2016-11-15 DIAGNOSIS — E782 Mixed hyperlipidemia: Secondary | ICD-10-CM

## 2016-11-15 DIAGNOSIS — Z1212 Encounter for screening for malignant neoplasm of rectum: Secondary | ICD-10-CM

## 2016-11-15 DIAGNOSIS — Z0001 Encounter for general adult medical examination with abnormal findings: Secondary | ICD-10-CM

## 2016-11-15 DIAGNOSIS — M51369 Other intervertebral disc degeneration, lumbar region without mention of lumbar back pain or lower extremity pain: Secondary | ICD-10-CM

## 2016-11-15 DIAGNOSIS — K219 Gastro-esophageal reflux disease without esophagitis: Secondary | ICD-10-CM

## 2016-11-15 LAB — CBC WITH DIFFERENTIAL/PLATELET
BASOS ABS: 0 {cells}/uL (ref 0–200)
BASOS PCT: 0 %
EOS ABS: 65 {cells}/uL (ref 15–500)
Eosinophils Relative: 1 %
HEMATOCRIT: 44.3 % (ref 35.0–45.0)
HEMOGLOBIN: 14.4 g/dL (ref 11.7–15.5)
LYMPHS ABS: 2535 {cells}/uL (ref 850–3900)
Lymphocytes Relative: 39 %
MCH: 27.8 pg (ref 27.0–33.0)
MCHC: 32.5 g/dL (ref 32.0–36.0)
MCV: 85.5 fL (ref 80.0–100.0)
MONO ABS: 520 {cells}/uL (ref 200–950)
MPV: 10 fL (ref 7.5–12.5)
Monocytes Relative: 8 %
NEUTROS ABS: 3380 {cells}/uL (ref 1500–7800)
Neutrophils Relative %: 52 %
PLATELETS: 220 10*3/uL (ref 140–400)
RBC: 5.18 MIL/uL — ABNORMAL HIGH (ref 3.80–5.10)
RDW: 14.4 % (ref 11.0–15.0)
WBC: 6.5 10*3/uL (ref 3.8–10.8)

## 2016-11-15 NOTE — Progress Notes (Signed)
[facility] Unk Pinto, M.D.    Uvaldo Bristle. Silverio Lay, P.A.-C      Starlyn Skeans, P.A.-C  [facility]                [address] [place] [id number] Telephone [phone] Telefax [phone]  Annual Screening/Preventative Visit & Comprehensive Evaluation &  Examination     This very nice 60 y.o. MWF presents for a Screening/Preventative Visit & comprehensive evaluation and management of multiple medical co-morbidities.  Patient has been followed for HTN, Prediabetes, Hyperlipidemia, Hypothyroidism and Vitamin D Deficiency.     Patient also is DDD/DJDon SS Disability for Fibromyalgia and Chronic interstitial cystitis. She does admit her chronic FM pains seemingly have improved over the last few years possible with correction of her vit D. She endorses aching of of limb-girdle musculature and fatigue , specifically rapid fatiguability even with non exertional activities.       HTN predates circa 2004. Patient's BP has been controlled at home and patient denies any cardiac symptoms as chest pain, palpitations, shortness of breath, dizziness or ankle swelling. Today's BP is at goal - 130/84.      Patient's hyperlipidemia is controlled with diet and medications. Patient denies myalgias or other medication SE's. Last lipids were at goal: Lab Results  Component Value Date   CHOL 154 07/28/2016   HDL 66 07/28/2016   LDLCALC 67 07/28/2016   TRIG 105 07/28/2016   CHOLHDL 2.3 07/28/2016      Patient has prediabetes predating since      and patient denies reactive hypoglycemic symptoms, visual blurring, diabetic polys, or paresthesias. Last A1c was at goal: Lab Results  Component Value Date   HGBA1C 5.4 07/28/2016      Patient has been on Thyroid replacement circa 2002. Finally, patient has history of Vitamin D Deficiency and last Vitamin D was at goal:  Lab Results  Component Value Date   VD25OH  66 04/18/2016   Current Outpatient Prescriptions on File Prior to Visit  Medication Sig  . ALPRAZolam (XANAX) 0.5 MG tablet TAKE ONE TABLET BY MOUTH THREE TIMES DAILY AS NEEDED  . cetirizine (ZYRTEC) 10 MG tablet Take 10 mg by mouth daily as needed for allergies.  . Cholecalciferol (VITAMIN D-3) 5000 UNITS TABS Take 15,000 Units by mouth daily.   . fluticasone (FLONASE) 50 MCG/ACT nasal spray Place 2 sprays into both nostrils daily as needed.  Marland Kitchen KLOR-CON M20 20 MEQ tablet TAKE ONE TABLET BY MOUTH TWICE DAILY  . Lactobacillus (ACIDOPHILUS) 100 MG CAPS Take 1 capsule by mouth daily.  Marland Kitchen levothyroxine (SYNTHROID, LEVOTHROID) 50 MCG tablet TAKE ONE TABLET BY MOUTH BEFORE BREAKFAST  . Multiple Vitamins-Minerals (GERITABS PO) Take 1 tablet by mouth daily. Reported on 06/08/2016  . ranitidine (ZANTAC) 300 MG tablet TAKE ONE TABLET BY MOUTH TWICE DAILY  . rosuvastatin (CRESTOR) 40 MG tablet Take 1 tablet (40 mg total) by mouth daily.  . tamoxifen (NOLVADEX) 20 MG tablet Take 20 mg by mouth daily.  . triazolam (HALCION) 0.25 MG tablet TAKE ONE TABLET BY MOUTH AT BEDTIME   No current facility-administered medications on file prior to visit.    Allergies  Allergen Reactions  . Lipitor [Atorvastatin]     Muscle and legs aches  . Shellfish Allergy Diarrhea and Nausea And Vomiting  . Betadine [Povidone Iodine] Rash   Past Medical History:  Diagnosis Date  . Anxiety   . Breast cancer ([place])   . DDD (degenerative disc disease), lumbar   . Erosion of vaginal mesh ([place])   . Fibromyalgia   . GERD (gastroesophageal reflux disease)   . History of gastric ulcer   . History of radiation therapy 07/06/16- 08/22/16   Right Breast  . Hypertension   . Hypothyroidism   . Rectocele    Health Maintenance  Topic Date Due  . COLONOSCOPY  11/27/2014  . Hepatitis C Screening  11/27/2018 (Originally March 17, 1956)  . HIV Screening  11/27/2018 (Originally 11/30/1972)  . MAMMOGRAM  04/18/2018  . PAP SMEAR  04/11/2019   . TETANUS/TDAP  03/15/2021  . INFLUENZA VACCINE  Completed   Immunization History  Administered Date(s) Administered  . Influenza Split 09/18/2013, 09/25/2014  . Influenza, Seasonal, Injecte, Preservative Fre 10/11/2015  . Influenza,inj,quad, With Preservative 08/28/2016  . Pneumococcal-Unspecified 11/27/2000  . Tdap 03/16/2011   Past Surgical History:  Procedure Laterality Date  . BLADDER TACK  2003  . BREAST LUMPECTOMY  1985  . CARDIOVASCULAR STRESS TEST  07-02-2013  DR DALTON MCLEAN   LOW RISK NUCLEAR STUDY WITH A SMALL, MILD APICAL SEPTAL FIXED PERFUSION DEFECT .  GIVEN NORMAL WALL FUNCTION , SUSPECT REPRESENTS ATTENUATION/  EF 74%/  NO ISCHEMIA  . CATARACT EXTRACTION W/ INTRAOCULAR LENS IMPLANT Left 01/2014  . CYSTO/  HYDRODISTENTION/  BLADDER BX/   INSTILLATION THERAPY  08-29-2010  . EXCISION OF MESH N/A 03/23/2014   Procedure: EXCISION OF VAGINAL AND PERI-URETHRAL MESH, EXTRACTION FROM URETHRA TO APEX, insertion of XENFORM in the vaginal vault;  Surgeon: Ailene Rud, MD;  Location: [facility];  Service: Urology;  Laterality: N/A;  . EXCISION VAGINAL CYST N/A 04/06/2014   Procedure: VAGINAL SPECULUM EXAMINATION/POSSIBLE DRAINAGE OF HEMATOMA;  Surgeon: Ailene Rud, MD;  Location[facility];  Service: Urology;  Laterality: N/A;  . INCONTINENCE SURGERY  2009  &  2005  . RADIOACTIVE SEED GUIDED MASTECTOMY WITH AXILLARY SENTINEL LYMPH NODE BIOPSY Right 05/18/2016   Procedure: RADIOACTIVE SEED GUIDED RIGHT BREAST LUMPECTOMY WITH AXILLARY SENTINEL LYMPH NODE BIOPSY;  Surgeon: Rolm Bookbinder, MD;  Location: [facility];  Service: General;  Laterality: Right;  RADIOACTIVE SEED GUIDED RIGHT BREAST LUMPECTOMY WITH AXILLARY SENTINEL LYMPH NODE BIOPSY  . TRANSTHORACIC ECHOCARDIOGRAM  06-16-2013   GRADE I DIASTOLIC DYSFUNCTION/  EF 55-60%/  MILD AR/  TRIVIAL MR  &  TR  . VAGINAL HYSTERECTOMY  2000   w/  unilateral  salpingoophorectomy  . VEIN BYPASS SURGERY  AGE 21   RIGHT LEG   Family History  Problem Relation Age of Onset  . Lung cancer Brother    Social History  Substance Use Topics  . Smoking status: Former Smoker    Years: 20.00    Types: Cigarettes    Quit date: 11/28/1995  . Smokeless tobacco: Current User    Types: Snuff     Comment: dips daily  . Alcohol use No    ROS Constitutional: Denies fever, chills, weight loss/gain, headaches, insomnia,  night sweats, and change in appetite. Does c/o fatigue. Eyes: Denies redness, blurred vision, diplopia, discharge, itchy, watery eyes.  ENT: Denies discharge, congestion, post nasal drip, epistaxis, sore throat, earache, hearing loss, dental pain, Tinnitus, Vertigo, Sinus pain, snoring.  Cardio: Denies chest pain, palpitations, irregular heartbeat, syncope, dyspnea, diaphoresis, orthopnea, PND, claudication, edema Respiratory: denies cough, dyspnea, DOE, pleurisy, hoarseness, laryngitis, wheezing.  Gastrointestinal: Denies dysphagia, heartburn, reflux, water brash, pain,  cramps, nausea, vomiting, bloating, diarrhea, constipation, hematemesis, melena, hematochezia, jaundice, hemorrhoids Genitourinary: Denies dysuria, frequency, urgency, nocturia, hesitancy, discharge, hematuria, flank pain Breast: Breast lumps, nipple discharge, bleeding.  Musculoskeletal: Denies arthralgia, myalgia, stiffness, Jt. Swelling, pain, limp, and strain/sprain. Denies falls. Skin: Denies puritis, rash, hives, warts, acne, eczema, changing in skin lesion Neuro: No weakness, tremor, incoordination, spasms, paresthesia, pain Psychiatric: Denies confusion, memory loss, sensory loss. Denies Depression. Endocrine: Denies change in weight, skin, hair change, nocturia, and paresthesia, diabetic polys, visual blurring, hyper / hypo glycemic episodes.  Heme/Lymph: No excessive bleeding, bruising, enlarged lymph nodes.  Physical Exam  BP 130/84   Pulse 88   Temp 97.5 F (36.4  C)   Resp 16   Ht 5' 2.5" (1.588 m)   Wt 165 lb 6.4 oz (75 kg)   BMI 29.77 kg/m   General Appearance: Well nourished and in no apparent distress.  Eyes: PERRLA, EOMs, conjunctiva no swelling or erythema, normal fundi and vessels. Sinuses: No frontal/maxillary tenderness ENT/Mouth: EACs patent / TMs  nl. Nares clear without erythema, swelling, mucoid exudates. Oral hygiene is good. No erythema, swelling, or exudate. Tongue normal, non-obstructing. Tonsils not swollen or erythematous. Hearing normal.  Neck: Supple, thyroid normal. No bruits, nodes or JVD. Respiratory: Respiratory effort normal.  BS equal and clear bilateral without rales, rhonci, wheezing or stridor. Cardio: Heart sounds are normal with regular rate and rhythm and no murmurs, rubs or gallops. Peripheral pulses are normal and equal bilaterally without edema. No aortic or femoral bruits. Chest: symmetric with normal excursions and percussion. Breasts: Symmetric, without lumps, nipple discharge, retractions, or fibrocystic changes.  Abdomen: Flat, soft with bowel sounds active. Nontender, no guarding, rebound, hernias, masses, or organomegaly.  Lymphatics: Non tender without lymphadenopathy.  Musculoskeletal: Full ROM all peripheral extremities, joint stability, 5/5 strength, and normal gait. Skin: Warm and dry without rashes, lesions, cyanosis, clubbing or  ecchymosis.  Neuro: Cranial nerves intact, reflexes equal bilaterally. Normal muscle tone, no cerebellar symptoms. Sensation intact.  Pysch: Alert and oriented X 3, normal affect, Insight and Judgment appropriate.   Assessment and Plan  1. Annual Preventative Screening Examination   2. Essential hypertension  - EKG 12-Lead - Urinalysis, Routine w reflex microscopic - CBC with Differential/Platelet - BASIC METABOLIC PANEL WITH GFR - TSH - Microalbumin / creatinine urine ratio  3. Mixed hyperlipidemia  - Hepatic function panel - Lipid panel - TSH  4.  Prediabetes  - Hemoglobin A1c - Insulin, random  5. Vitamin D deficiency  - VITAMIN D 25 Hydroxy   6. Hypothyroidism   7. Screening for ischemic heart disease   8. Fibromyalgia Syndrome   9. DDD, lumbar   10. Screening for rectal cancer  - POC Hemoccult Bld/Stl   11. Gastroesophageal reflux disease   12. Medication management  - EKG 12-Lead - Urinalysis, Routine w reflex microscopic - Magnesium       Continue prudent diet as discussed, weight control, BP monitoring, regular exercise, and medications. Discussed med's effects and SE's. Screening labs and tests as requested with regular follow-up as recommended. Over 40 minutes of exam, counseling, chart review and high complex critical decision making was performed.

## 2016-11-15 NOTE — Patient Instructions (Signed)

## 2016-11-16 ENCOUNTER — Other Ambulatory Visit: Payer: Self-pay | Admitting: Internal Medicine

## 2016-11-16 DIAGNOSIS — E039 Hypothyroidism, unspecified: Secondary | ICD-10-CM

## 2016-11-16 LAB — TSH: TSH: 9.68 mIU/L — ABNORMAL HIGH

## 2016-11-16 LAB — BASIC METABOLIC PANEL WITH GFR
BUN: 11 mg/dL (ref 7–25)
CHLORIDE: 104 mmol/L (ref 98–110)
CO2: 19 mmol/L — AB (ref 20–31)
CREATININE: 0.79 mg/dL (ref 0.50–1.05)
Calcium: 8.9 mg/dL (ref 8.6–10.4)
GFR, Est African American: >89 mL/min (ref >=60)
GFR, Est Non African American: 83 mL/min (ref >=60)
Glucose, Bld: 125 mg/dL — ABNORMAL HIGH (ref 65–99)
POTASSIUM: 3.6 mmol/L (ref 3.5–5.3)
Sodium: 141 mmol/L (ref 135–146)

## 2016-11-16 LAB — HEPATIC FUNCTION PANEL
ALBUMIN: 4.2 g/dL (ref 3.6–5.1)
ALK PHOS: 55 U/L (ref 33–130)
ALT: 21 U/L (ref 6–29)
AST: 27 U/L (ref 10–35)
BILIRUBIN TOTAL: 0.4 mg/dL (ref 0.2–1.2)
Bilirubin, Direct: 0.1 mg/dL (ref <=0.2)
Indirect Bilirubin: 0.3 mg/dL (ref 0.2–1.2)
Total Protein: 6.7 g/dL (ref 6.1–8.1)

## 2016-11-16 LAB — MICROALBUMIN / CREATININE URINE RATIO
CREATININE, URINE: 374 mg/dL — AB (ref 20–320)
MICROALB UR: 3.2 mg/dL
Microalb Creat Ratio: 9 mcg/mg creat (ref <30)

## 2016-11-16 LAB — URINALYSIS, ROUTINE W REFLEX MICROSCOPIC
Bilirubin Urine: NEGATIVE
GLUCOSE, UA: NEGATIVE
HGB URINE DIPSTICK: NEGATIVE
LEUKOCYTES UA: NEGATIVE
NITRITE: NEGATIVE
PH: 5.5 (ref 5.0–8.0)
SPECIFIC GRAVITY, URINE: 1.026 (ref 1.001–1.035)

## 2016-11-16 LAB — LIPID PANEL
CHOL/HDL RATIO: 2.6 ratio (ref <5.0)
Cholesterol: 143 mg/dL (ref <200)
HDL: 56 mg/dL (ref >50)
LDL CALC: 64 mg/dL (ref <100)
Triglycerides: 116 mg/dL (ref <150)
VLDL: 23 mg/dL (ref <30)

## 2016-11-16 LAB — HEMOGLOBIN A1C
Hgb A1c MFr Bld: 5.4 % (ref <5.7)
MEAN PLASMA GLUCOSE: 108 mg/dL

## 2016-11-16 LAB — VITAMIN D 25 HYDROXY (VIT D DEFICIENCY, FRACTURES): Vit D, 25-Hydroxy: 120 ng/mL — ABNORMAL HIGH (ref 30–100)

## 2016-11-16 LAB — INSULIN, RANDOM: INSULIN: 62.5 u[IU]/mL — AB (ref 2.0–19.6)

## 2016-11-16 LAB — MAGNESIUM: MAGNESIUM: 2 mg/dL (ref 1.5–2.5)

## 2016-12-05 ENCOUNTER — Other Ambulatory Visit: Payer: Self-pay | Admitting: Internal Medicine

## 2016-12-11 ENCOUNTER — Other Ambulatory Visit: Payer: Self-pay | Admitting: Internal Medicine

## 2016-12-11 DIAGNOSIS — G4700 Insomnia, unspecified: Secondary | ICD-10-CM

## 2016-12-11 NOTE — Telephone Encounter (Signed)
Please call Triazolam

## 2016-12-12 ENCOUNTER — Other Ambulatory Visit: Payer: Self-pay | Admitting: Internal Medicine

## 2016-12-12 DIAGNOSIS — G4700 Insomnia, unspecified: Secondary | ICD-10-CM

## 2016-12-18 ENCOUNTER — Ambulatory Visit (INDEPENDENT_AMBULATORY_CARE_PROVIDER_SITE_OTHER): Payer: Medicare Other

## 2016-12-18 DIAGNOSIS — E039 Hypothyroidism, unspecified: Secondary | ICD-10-CM

## 2016-12-18 LAB — TSH: TSH: 8.55 mIU/L — ABNORMAL HIGH

## 2016-12-18 NOTE — Progress Notes (Signed)
Pt presents for TSH nurse/lab visit only.  Per pt takes thyroid meds as follows  50 mcgs of levothyroxine:  One and a half tablet: Monday, [facility] Friday  ONE TABLET: Tuesday, Thursday, Saturday, & Sunday.

## 2016-12-19 ENCOUNTER — Other Ambulatory Visit: Payer: Self-pay | Admitting: Internal Medicine

## 2016-12-19 DIAGNOSIS — E039 Hypothyroidism, unspecified: Secondary | ICD-10-CM

## 2016-12-25 ENCOUNTER — Other Ambulatory Visit: Payer: Self-pay | Admitting: *Deleted

## 2016-12-25 DIAGNOSIS — C50411 Malignant neoplasm of upper-outer quadrant of right female breast: Secondary | ICD-10-CM

## 2016-12-26 ENCOUNTER — Other Ambulatory Visit (HOSPITAL_BASED_OUTPATIENT_CLINIC_OR_DEPARTMENT_OTHER): Payer: Medicare Other

## 2016-12-26 ENCOUNTER — Ambulatory Visit (HOSPITAL_BASED_OUTPATIENT_CLINIC_OR_DEPARTMENT_OTHER): Payer: Medicare Other | Admitting: Oncology

## 2016-12-26 VITALS — BP 117/61 | HR 92 | Temp 97.5°F | Resp 18 | Ht 62.5 in | Wt 167.6 lb

## 2016-12-26 DIAGNOSIS — C50411 Malignant neoplasm of upper-outer quadrant of right female breast: Secondary | ICD-10-CM

## 2016-12-26 DIAGNOSIS — Z170 Estrogen receptor positive status [ER+]: Secondary | ICD-10-CM | POA: Diagnosis not present

## 2016-12-26 LAB — CBC WITH DIFFERENTIAL/PLATELET
BASO%: 0.3 % (ref 0.0–2.0)
Basophils Absolute: 0 10*3/uL (ref 0.0–0.1)
EOS%: 0.6 % (ref 0.0–7.0)
Eosinophils Absolute: 0 10*3/uL (ref 0.0–0.5)
HEMATOCRIT: 41.5 % (ref 34.8–46.6)
HEMOGLOBIN: 13.6 g/dL (ref 11.6–15.9)
LYMPH#: 3.2 10*3/uL (ref 0.9–3.3)
LYMPH%: 45.5 % (ref 14.0–49.7)
MCH: 28 pg (ref 25.1–34.0)
MCHC: 32.8 g/dL (ref 31.5–36.0)
MCV: 85.4 fL (ref 79.5–101.0)
MONO#: 0.3 10*3/uL (ref 0.1–0.9)
MONO%: 4.7 % (ref 0.0–14.0)
NEUT%: 48.9 % (ref 38.4–76.8)
NEUTROS ABS: 3.4 10*3/uL (ref 1.5–6.5)
Platelets: 217 10*3/uL (ref 145–400)
RBC: 4.86 10*6/uL (ref 3.70–5.45)
RDW: 13.7 % (ref 11.2–14.5)
WBC: 7 10*3/uL (ref 3.9–10.3)

## 2016-12-26 LAB — COMPREHENSIVE METABOLIC PANEL
ALBUMIN: 3.7 g/dL (ref 3.5–5.0)
ALK PHOS: 62 U/L (ref 40–150)
ALT: 21 U/L (ref 0–55)
AST: 20 U/L (ref 5–34)
Anion Gap: 9 mEq/L (ref 3–11)
BUN: 10.2 mg/dL (ref 7.0–26.0)
CALCIUM: 9 mg/dL (ref 8.4–10.4)
CO2: 26 mEq/L (ref 22–29)
CREATININE: 0.7 mg/dL (ref 0.6–1.1)
Chloride: 105 mEq/L (ref 98–109)
EGFR: 89 mL/min/{1.73_m2} — ABNORMAL LOW (ref >90)
GLUCOSE: 117 mg/dL (ref 70–140)
POTASSIUM: 3.9 meq/L (ref 3.5–5.1)
SODIUM: 140 meq/L (ref 136–145)
Total Bilirubin: 0.4 mg/dL (ref 0.20–1.20)
Total Protein: 6.8 g/dL (ref 6.4–8.3)

## 2016-12-26 NOTE — Progress Notes (Signed)
[facility]  Telephone:(336) [phone] Fax:(336) [phone]     ID: Desiree Anderson DOB: 17-Jan-1956  MR#: [id number]  FMB#:[id number]  Patient Care Team: Unk Pinto, MD as PCP - General (Internal Medicine) Fay Records, MD as Consulting Physician (Cardiology) [name], MD as Consulting Physician (Orthopedic Surgery) Carolan Clines, MD as Consulting Physician (Urology) Vanessa Kick, MD as Consulting Physician (Obstetrics and Gynecology) Chauncey Cruel, MD as Consulting Physician (Oncology) Rolm Bookbinder, MD as Consulting Physician (General Surgery) Thea Silversmith, MD as Consulting Physician (Radiation Oncology) PCP: Alesia Richards, MD GYN: OTHER MD:  CHIEF COMPLAINT: Estrogen receptor positive invasive breast cancer  CURRENT TREATMENT: Tamoxifen   BREAST CANCER HISTORY: From the original intake note:  Desiree Anderson had routine screening mammography showing a possible area of distortion in the right breast. On 04/18/2016 she had diagnostic right mammography with tomography and right breast ultrasonography at the [facility]. Breast density was category C. In the upper outer quadrant of the right breast there was a persistent area of architectural distortion which was not palpable on exam. Ultrasonography confirmed a 1.0 cm irregular mass at the 11:00 position 3 cm from the nipple. There was no right axillary lymphadenopathy by ultrasonography.  Biopsy of the right breast mass in question was obtained the same day and showed (S [phone]) an invasive ductal carcinoma, grade 2, estrogen and progesterone receptor both 100% positive with strong staining intensity, with an MIB-1 of 5%, and no HER-2 amplification, the signals ratio being 1.57, the number per cell 3.85.  Her subsequent history is as detailed below  INTERVAL HISTORY: Desiree Anderson returns today for follow-up of her estrogen receptor positive breast cancer.  She started tamoxifen in mid November. She  is tolerating that well. She does have hot flashes perhaps once a day lasting about 5 minutes and causing her to sweat. She puts back her hair and opens her shirt a little and it passes. She usually takes it at noon with all her other vitamins. She does not like the idea of taking it at that time. She is heading that drug and approximately $10 per month  REVIEW OF SYSTEMS: She still feels mildly fatigued. She has occasional aches and pains here and there which are not more intense or persistent than before. She feels anxious but not depressed. She has been more active and has noted a little bit of swelling in her right arm. There has been no heat or erythema. A detailed review of systems today was otherwise stable.  PAST MEDICAL HISTORY: Past Medical History:  Diagnosis Date  . Anxiety   . Breast cancer ([place])   . DDD (degenerative disc disease), lumbar   . Erosion of vaginal mesh ([place])   . Fibromyalgia   . GERD (gastroesophageal reflux disease)   . History of gastric ulcer   . History of radiation therapy 07/06/16- 08/22/16   Right Breast  . Hypertension   . Hypothyroidism   . Rectocele     PAST SURGICAL HISTORY: Past Surgical History:  Procedure Laterality Date  . BLADDER TACK  2003  . BREAST LUMPECTOMY  1985  . CARDIOVASCULAR STRESS TEST  07-02-2013  DR DALTON MCLEAN   LOW RISK NUCLEAR STUDY WITH A SMALL, MILD APICAL SEPTAL FIXED PERFUSION DEFECT .  GIVEN NORMAL WALL FUNCTION , SUSPECT REPRESENTS ATTENUATION/  EF 74%/  NO ISCHEMIA  . CATARACT EXTRACTION W/ INTRAOCULAR LENS IMPLANT Left 01/2014  . CYSTO/  HYDRODISTENTION/  BLADDER BX/   INSTILLATION THERAPY  08-29-2010  .  EXCISION OF MESH N/A 03/23/2014   Procedure: EXCISION OF VAGINAL AND PERI-URETHRAL MESH, EXTRACTION FROM URETHRA TO APEX, insertion of XENFORM in the vaginal vault;  Surgeon: Ailene Rud, MD;  Location: [facility];  Service: Urology;  Laterality: N/A;  . EXCISION VAGINAL CYST N/A 04/06/2014    Procedure: VAGINAL SPECULUM EXAMINATION/POSSIBLE DRAINAGE OF HEMATOMA;  Surgeon: Ailene Rud, MD;  Location: [facility];  Service: Urology;  Laterality: N/A;  . INCONTINENCE SURGERY  2009  &  2005  . RADIOACTIVE SEED GUIDED MASTECTOMY WITH AXILLARY SENTINEL LYMPH NODE BIOPSY Right 05/18/2016   Procedure: RADIOACTIVE SEED GUIDED RIGHT BREAST LUMPECTOMY WITH AXILLARY SENTINEL LYMPH NODE BIOPSY;  Surgeon: Rolm Bookbinder, MD;  Location: [facility];  Service: General;  Laterality: Right;  RADIOACTIVE SEED GUIDED RIGHT BREAST LUMPECTOMY WITH AXILLARY SENTINEL LYMPH NODE BIOPSY  . TRANSTHORACIC ECHOCARDIOGRAM  06-16-2013   GRADE I DIASTOLIC DYSFUNCTION/  EF 55-60%/  MILD AR/  TRIVIAL MR  &  TR  . VAGINAL HYSTERECTOMY  2000   w/  unilateral salpingoophorectomy  . VEIN BYPASS SURGERY  AGE 89   RIGHT LEG    FAMILY HISTORY Family History  Problem Relation Age of Onset  . Lung cancer Brother   [name] father died at the age of 10 from a myocardial infarction. He was not a smoker however. [name] mother died with renal failure at the age of 63. The patient had 4 brothers, 3 sisters. The only cancer in the family was one brother diagnosed with lung cancer at the age of 58. There is no history of breast or ovarian cancer in the family.  GYNECOLOGIC HISTORY:  No LMP recorded. Patient has had an ablation. Menarche age 61, first live birth age 30, the patient is Hardy P2. She stopped having periods approximately 2003. She took hormone replacement approximately 3 years. She also used birth control pills remotely for approximately 20 years, with no complications.  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 Her husband Desiree Anderson family business, produce an plants, in [employer], for 17 years. He is now retired. Daughter Dentist lives in [place] and is a homemaker. Son  Desiree Anderson lives in [place] and has multiple health problems. The patient has 2 granddaughters. She also has 2 grandchildren who have died. She is a Furniture conservator/restorer.    ADVANCED DIRECTIVES: Not in place   HEALTH MAINTENANCE: Social History  Substance Use Topics  . Smoking status: Former Smoker    Years: 20.00    Types: Cigarettes    Quit date: 11/28/1995  . Smokeless tobacco: Current User    Types: Snuff     Comment: dips daily  . Alcohol use No     Colonoscopy: 2007/ [facility]  PAP:  Bone density: 2012/ [facility]  Lipid panel:  Allergies  Allergen Reactions  . Lipitor [Atorvastatin]     Muscle and legs aches  . Shellfish Allergy Diarrhea and Nausea And Vomiting  . Betadine [Povidone Iodine] Rash    Current Outpatient Prescriptions  Medication Sig Dispense Refill  . ALPRAZolam (XANAX) 0.5 MG tablet TAKE ONE TABLET BY MOUTH THREE TIMES DAILY AS NEEDED 90 tablet 0  . cetirizine (ZYRTEC) 10 MG tablet Take 10 mg by mouth daily as needed for allergies.    . Cholecalciferol (VITAMIN D-3) 5000 UNITS TABS Take 15,000 Units  by mouth daily.     . fluticasone (FLONASE) 50 MCG/ACT nasal spray Place 2 sprays into both nostrils daily as needed. 16 g 3  . KLOR-CON M20 20 MEQ tablet TAKE ONE TABLET BY MOUTH TWICE DAILY 180 tablet 1  . Lactobacillus (ACIDOPHILUS) 100 MG CAPS Take 1 capsule by mouth daily.    Marland Kitchen levothyroxine (SYNTHROID, LEVOTHROID) 50 MCG tablet TAKE ONE TABLET BY MOUTH BEFORE BREAKFAST 90 tablet 0  . Multiple Vitamins-Minerals (GERITABS PO) Take 1 tablet by mouth daily. Reported on 06/08/2016    . ranitidine (ZANTAC) 300 MG tablet TAKE ONE TABLET BY MOUTH TWICE DAILY 180 tablet 3  . rosuvastatin (CRESTOR) 40 MG tablet Take 1 tablet (40 mg total) by mouth daily. 90 tablet 1  . tamoxifen (NOLVADEX) 20 MG tablet Take 20 mg by mouth daily.    . triazolam (HALCION) 0.25 MG tablet TAKE ONE TABLET BY MOUTH AT BEDTIME 30 tablet 0   No current facility-administered medications for  this visit.     OBJECTIVE: Middle-aged white woman In no acute distress  Vitals:   12/26/16 1246  BP: 117/61  Pulse: 92  Resp: 18  Temp: 97.5 F (36.4 C)     Body mass index is 30.17 kg/m.    ECOG FS:1 - Symptomatic but completely ambulatory  Sclerae unicteric, EOMs intact Oropharynx clear and moist No cervical or supraclavicular adenopathy Lungs no rales or rhonchi Heart regular rate and rhythm Abd soft, nontender, positive bowel sounds MSK no focal spinal tenderness, no upper extremity lymphedema Neuro: nonfocal, well oriented, appropriate affect Breasts: The right breast is status post lumpectomy and radiation. The hyperpigmentation has largely resolved. There is no desquamation. The cosmetic result is excellent. The right axilla is benign. The left breast is unremarkable.   LAB RESULTS:  CMP     Component Value Date/Time   NA 140 12/26/2016 1230   K 3.9 12/26/2016 1230   CL 104 11/15/2016 1436   CO2 26 12/26/2016 1230   GLUCOSE 117 12/26/2016 1230   BUN 10.2 12/26/2016 1230   CREATININE 0.7 12/26/2016 1230   CALCIUM 9.0 12/26/2016 1230   PROT 6.8 12/26/2016 1230   ALBUMIN 3.7 12/26/2016 1230   AST 20 12/26/2016 1230   ALT 21 12/26/2016 1230   ALKPHOS 62 12/26/2016 1230   BILITOT 0.40 12/26/2016 1230   GFRNONAA 83 11/15/2016 1436   GFRAA >89 11/15/2016 1436    INo results found for: SPEP, UPEP  Lab Results  Component Value Date   WBC 7.0 12/26/2016   NEUTROABS 3.4 12/26/2016   HGB 13.6 12/26/2016   HCT 41.5 12/26/2016   MCV 85.4 12/26/2016   PLT 217 12/26/2016      Chemistry      Component Value Date/Time   NA 140 12/26/2016 1230   K 3.9 12/26/2016 1230   CL 104 11/15/2016 1436   CO2 26 12/26/2016 1230   BUN 10.2 12/26/2016 1230   CREATININE 0.7 12/26/2016 1230      Component Value Date/Time   CALCIUM 9.0 12/26/2016 1230   ALKPHOS 62 12/26/2016 1230   AST 20 12/26/2016 1230   ALT 21 12/26/2016 1230   BILITOT 0.40 12/26/2016 1230        No results found for: LABCA2  No components found for: LABCA125  No results for input(s): INR in the last 168 hours.  Urinalysis    Component Value Date/Time   COLORURINE DARK YELLOW 11/15/2016 1436   APPEARANCEUR CLOUDY (A) 11/15/2016 1436   LABSPEC  1.026 11/15/2016 1436   PHURINE 5.5 11/15/2016 1436   GLUCOSEU NEGATIVE 11/15/2016 1436   HGBUR NEGATIVE 11/15/2016 1436   BILIRUBINUR NEGATIVE 11/15/2016 1436   KETONESUR TRACE (A) 11/15/2016 1436   PROTEINUR TRACE (A) 11/15/2016 1436   NITRITE NEGATIVE 11/15/2016 1436   LEUKOCYTESUR NEGATIVE 11/15/2016 1436      ELIGIBLE FOR AVAILABLE RESEARCH PROTOCOL: no  STUDIES: No results found.  ASSESSMENT: 61 y.o. [place] woman status post right breast upper outer quadrant biopsy 04/18/2016 for a clinical T1b N0, stage IA invasive ductal carcinoma, grade 2, estrogen and progesterone receptor strongly positive, HER-2 nonamplified, with an MIB-1 of 5%.   (1) Status post right lumpectomy and sentinel lymph node sampling 05/18/2016 for a pT1c pN0, stage IA invasive ductal carcinoma, grade 1, with negative margins, Repeat HER-2 again negative  (2) Oncotype DX score of 12 predicts a 10 year risk of recurrence outside the breast of 8% the patient's only systemic therapy is tamoxifen for 5 years. It also protects no benefit from adjuvant chemotherapy. He  (3) adjuvant radiation 07/06/2016-08/22/2016  (4) to start tamoxifen 09/27/2016  PLAN: Vrinda is tolerating tamoxifen remarkably well and the plan will be to continue that for a total of 5 years. In addition to cutting in half her breast cancer risk it should help with bone density issues.  I don't think we need any particular intervention for her hot flashes at this time. They generally improve as the months pass. If she wishes to try venlafaxine or gabapentin at some point she will let me know.  Otherwise she is seeing Dr. Melford Aase frequently because of thyroid adjustments and  other issues and she will see her gynecologist in May. She will have her mammography at that time.  Accordingly I will see her again in August and then yearly until she completes her follow-up here.  She knows to call for any other problems that may develop before the next     Chauncey Cruel, MD   12/26/2016 1:19 PM Medical Oncology and Hematology [facility] [place],  [address] Tel. [phone]    Fax. [phone]

## 2017-01-08 ENCOUNTER — Other Ambulatory Visit: Payer: Self-pay | Admitting: Internal Medicine

## 2017-01-08 DIAGNOSIS — G4700 Insomnia, unspecified: Secondary | ICD-10-CM

## 2017-01-08 NOTE — Telephone Encounter (Signed)
Xanax was called into pharmacy @ 8:24am on 12th Feb 2018 by DD

## 2017-01-10 ENCOUNTER — Other Ambulatory Visit: Payer: Self-pay | Admitting: Internal Medicine

## 2017-01-10 DIAGNOSIS — G4700 Insomnia, unspecified: Secondary | ICD-10-CM

## 2017-01-11 ENCOUNTER — Other Ambulatory Visit: Payer: Self-pay | Admitting: Physician Assistant

## 2017-01-11 DIAGNOSIS — G4700 Insomnia, unspecified: Secondary | ICD-10-CM

## 2017-01-11 MED ORDER — TRIAZOLAM 0.25 MG PO TABS: 0.2500 mg | ORAL_TABLET | Freq: Every day | ORAL | 2 refills | Status: DC

## 2017-01-11 NOTE — Telephone Encounter (Signed)
Halcion was called into pharmacy on 15th Feb 2018 @ 2018 by DD

## 2017-01-12 NOTE — Progress Notes (Signed)
Triazolam was called into  Pharmacy on 16th Feb 2018 @ 8:24am by DD

## 2017-01-19 ENCOUNTER — Encounter: Payer: Self-pay | Admitting: *Deleted

## 2017-01-19 ENCOUNTER — Ambulatory Visit (INDEPENDENT_AMBULATORY_CARE_PROVIDER_SITE_OTHER): Payer: Medicare Other | Admitting: *Deleted

## 2017-01-19 DIAGNOSIS — E039 Hypothyroidism, unspecified: Secondary | ICD-10-CM | POA: Diagnosis not present

## 2017-01-19 LAB — TSH: TSH: 2.7 m[IU]/L

## 2017-01-19 NOTE — Progress Notes (Signed)
Patient here for a recheck on her TSH.  She takes 50 mcg 1.5 tablet daily and does not eat or drink for 2 hours following the medication.

## 2017-01-26 DIAGNOSIS — H04123 Dry eye syndrome of bilateral lacrimal glands: Secondary | ICD-10-CM | POA: Diagnosis not present

## 2017-02-12 ENCOUNTER — Other Ambulatory Visit: Payer: Self-pay | Admitting: Internal Medicine

## 2017-02-19 ENCOUNTER — Encounter: Payer: Self-pay | Admitting: Physician Assistant

## 2017-02-19 ENCOUNTER — Ambulatory Visit (INDEPENDENT_AMBULATORY_CARE_PROVIDER_SITE_OTHER): Payer: Medicare Other | Admitting: Physician Assistant

## 2017-02-19 VITALS — BP 124/68 | HR 77 | Temp 97.3°F | Resp 16 | Ht 62.5 in | Wt 170.2 lb

## 2017-02-19 DIAGNOSIS — M797 Fibromyalgia: Secondary | ICD-10-CM | POA: Diagnosis not present

## 2017-02-19 DIAGNOSIS — E6609 Other obesity due to excess calories: Secondary | ICD-10-CM

## 2017-02-19 DIAGNOSIS — M5136 Other intervertebral disc degeneration, lumbar region: Secondary | ICD-10-CM | POA: Diagnosis not present

## 2017-02-19 DIAGNOSIS — M150 Primary generalized (osteo)arthritis: Secondary | ICD-10-CM

## 2017-02-19 DIAGNOSIS — Z720 Tobacco use: Secondary | ICD-10-CM

## 2017-02-19 DIAGNOSIS — R7303 Prediabetes: Secondary | ICD-10-CM

## 2017-02-19 DIAGNOSIS — Z0001 Encounter for general adult medical examination with abnormal findings: Secondary | ICD-10-CM | POA: Diagnosis not present

## 2017-02-19 DIAGNOSIS — M51369 Other intervertebral disc degeneration, lumbar region without mention of lumbar back pain or lower extremity pain: Secondary | ICD-10-CM

## 2017-02-19 DIAGNOSIS — R6889 Other general symptoms and signs: Secondary | ICD-10-CM | POA: Diagnosis not present

## 2017-02-19 DIAGNOSIS — M8949 Other hypertrophic osteoarthropathy, multiple sites: Secondary | ICD-10-CM

## 2017-02-19 DIAGNOSIS — E66811 Obesity, class 1: Secondary | ICD-10-CM

## 2017-02-19 DIAGNOSIS — I10 Essential (primary) hypertension: Secondary | ICD-10-CM | POA: Diagnosis not present

## 2017-02-19 DIAGNOSIS — N8111 Cystocele, midline: Secondary | ICD-10-CM

## 2017-02-19 DIAGNOSIS — Z79899 Other long term (current) drug therapy: Secondary | ICD-10-CM | POA: Diagnosis not present

## 2017-02-19 DIAGNOSIS — E559 Vitamin D deficiency, unspecified: Secondary | ICD-10-CM

## 2017-02-19 DIAGNOSIS — C50411 Malignant neoplasm of upper-outer quadrant of right female breast: Secondary | ICD-10-CM

## 2017-02-19 DIAGNOSIS — E782 Mixed hyperlipidemia: Secondary | ICD-10-CM

## 2017-02-19 DIAGNOSIS — E039 Hypothyroidism, unspecified: Secondary | ICD-10-CM | POA: Diagnosis not present

## 2017-02-19 DIAGNOSIS — Z0000 Encounter for general adult medical examination without abnormal findings: Secondary | ICD-10-CM

## 2017-02-19 DIAGNOSIS — Z6830 Body mass index (BMI) 30.0-30.9, adult: Secondary | ICD-10-CM

## 2017-02-19 DIAGNOSIS — M159 Polyosteoarthritis, unspecified: Secondary | ICD-10-CM

## 2017-02-19 DIAGNOSIS — Z170 Estrogen receptor positive status [ER+]: Secondary | ICD-10-CM

## 2017-02-19 LAB — CBC WITH DIFFERENTIAL/PLATELET
BASOS ABS: 0 {cells}/uL (ref 0–200)
Basophils Relative: 0 %
EOS ABS: 288 {cells}/uL (ref 15–500)
EOS PCT: 4 %
HCT: 40 % (ref 35.0–45.0)
Hemoglobin: 13 g/dL (ref 11.7–15.5)
LYMPHS PCT: 47 %
Lymphs Abs: 3384 cells/uL (ref 850–3900)
MCH: 27.8 pg (ref 27.0–33.0)
MCHC: 32.5 g/dL (ref 32.0–36.0)
MCV: 85.5 fL (ref 80.0–100.0)
MONOS PCT: 7 %
MPV: 9.6 fL (ref 7.5–12.5)
Monocytes Absolute: 504 cells/uL (ref 200–950)
NEUTROS PCT: 42 %
Neutro Abs: 3024 cells/uL (ref 1500–7800)
Platelets: 238 10*3/uL (ref 140–400)
RBC: 4.68 MIL/uL (ref 3.80–5.10)
RDW: 13.6 % (ref 11.0–15.0)
WBC: 7.2 10*3/uL (ref 3.8–10.8)

## 2017-02-19 LAB — BASIC METABOLIC PANEL WITH GFR
BUN: 12 mg/dL (ref 7–25)
CALCIUM: 9 mg/dL (ref 8.6–10.4)
CO2: 28 mmol/L (ref 20–31)
CREATININE: 0.66 mg/dL (ref 0.50–1.05)
Chloride: 104 mmol/L (ref 98–110)
GFR, Est African American: >89 mL/min (ref >=60)
GFR, Est Non African American: >89 mL/min (ref >=60)
Glucose, Bld: 86 mg/dL (ref 65–99)
Potassium: 4.3 mmol/L (ref 3.5–5.3)
SODIUM: 141 mmol/L (ref 135–146)

## 2017-02-19 LAB — HEPATIC FUNCTION PANEL
ALBUMIN: 3.8 g/dL (ref 3.6–5.1)
ALT: 19 U/L (ref 6–29)
AST: 20 U/L (ref 10–35)
Alkaline Phosphatase: 44 U/L (ref 33–130)
BILIRUBIN TOTAL: 0.4 mg/dL (ref 0.2–1.2)
Bilirubin, Direct: 0.1 mg/dL (ref <=0.2)
Indirect Bilirubin: 0.3 mg/dL (ref 0.2–1.2)
Total Protein: 6.2 g/dL (ref 6.1–8.1)

## 2017-02-19 LAB — LIPID PANEL
CHOLESTEROL: 145 mg/dL (ref <200)
HDL: 48 mg/dL — AB (ref >50)
LDL CALC: 70 mg/dL (ref <100)
TRIGLYCERIDES: 135 mg/dL (ref <150)
Total CHOL/HDL Ratio: 3 Ratio (ref <5.0)
VLDL: 27 mg/dL (ref <30)

## 2017-02-19 LAB — TSH: TSH: 1.53 m[IU]/L

## 2017-02-19 NOTE — Progress Notes (Addendum)
MEDICARE ANNUAL WELLNESS VISIT AND FOLLOW UP  Assessment:   Essential hypertension - continue medications, DASH diet, exercise and monitor at home. Call if greater than 130/80.  -     CBC with Differential/Platelet -     BASIC METABOLIC PANEL WITH GFR -     Hepatic function panel  Hypothyroidism, unspecified type - continue medications, DASH diet, exercise and monitor at home. Call if greater than 130/80.  -     TSH  Mixed hyperlipidemia -continue medications, check lipids, decrease fatty foods, increase activity.  -     Lipid panel  Prediabetes Discussed general issues about diabetes pathophysiology and management., Educational material distributed., Suggested low cholesterol diet., Encouraged aerobic exercise., Discussed foot care., Reminded to get yearly retinal exam. -     Hemoglobin A1c  Vitamin D deficiency -     VITAMIN D 25 Hydroxy (Vit-D Deficiency, Fractures)  Medication management -     Magnesium  Malignant neoplasm of upper-outer quadrant of right breast in female, estrogen receptor positive ([place]) Continue tamoxifen  Encounter for Medicare annual wellness exam Colonoscopy- patient declines a colonoscopy even though the risks and benefits were discussed at length. Colon cancer is 3rd most diagnosed cancer and 2nd leading cause of death in both men and women 44 years of age and older. Patient understands the risk of cancer and death with declining the test however they are willing to do cologuard screening instead. They understand that this is not as sensitive or specific as a colonoscopy and they are still recommended to get a colonoscopy. The cologuard will be sent out to their house.   Class 1 obesity due to excess calories with serious comorbidity and body mass index (BMI) of 30.0 to 30.9 in adult - long discussion about weight loss, diet, and exercise  Fibromyalgia Syndrome Continue medications, continue follow up  Tobacco chew use toabacco cessation-   instruction/counseling given, , patient not ready to quit at this time.   Primary osteoarthritis involving multiple joints Continue medications, continue follow up  DDD, lumbar Continue medications, continue follow up  Cystocele, midline Continue medications, continue follow up   Over 40 minutes of exam, counseling, chart review and critical decision making was performed Future Appointments Date Time Provider [place]  05/24/2017 11:45 AM Unk Pinto, MD [facility] None  07/03/2017 12:30 PM [facility] ONLY [facility] None  07/03/2017 1:00 PM Chauncey Cruel, MD [facility] None  12/17/2017 2:00 PM Unk Pinto, MD [facility] None     Plan:   During the course of the visit the patient was educated and counseled about appropriate screening and preventive services including:    Pneumococcal vaccine   Prevnar 13  Influenza vaccine  Td vaccine  Screening electrocardiogram  Bone densitometry screening  Colorectal cancer screening  Diabetes screening  Glaucoma screening  Nutrition counseling   Advanced directives: requested   Subjective:  Desiree Anderson is a 61 y.o. female who presents for Medicare Annual Wellness Visit and follow up.    Her blood pressure has been controlled at home, today their BP is BP: 124/68 She does not workout. She denies chest pain, shortness of breath, dizziness.  She is on cholesterol medication and denies myalgias. Her cholesterol is at goal. The cholesterol last visit was:   Lab Results  Component Value Date   CHOL 143 11/15/2016   HDL 56 11/15/2016   LDLCALC 64 11/15/2016   TRIG 116 11/15/2016   CHOLHDL 2.6 11/15/2016   She has DDD/FM/CIS and is on  disability.  Her last A1C was  Lab Results  Component Value Date   HGBA1C 5.4 11/15/2016   Last GFR: Lab Results  Component Value Date   GFRNONAA 83 11/15/2016   She was diagnosed with right breast cancer May 2017, has had R lumpectomy s/p radiation and is now on  tamoxifen, follows with Dr. Jana Hakim.  Patient is on Vitamin D supplement.   Lab Results  Component Value Date   VD25OH 120 (H) 11/15/2016     She is on thyroid medication. Her medication was not changed last visit.   Lab Results  Component Value Date   TSH 2.70 01/19/2017  .  BMI is Body mass index is 30.63 kg/m., she is working on diet and exercise. Wt Readings from Last 3 Encounters:  02/19/17 170 lb 3.2 oz (77.2 kg)  12/26/16 167 lb 9.6 oz (76 kg)  12/18/16 168 lb (76.2 kg)     Medication Review: Current Outpatient Prescriptions on File Prior to Visit  Medication Sig Dispense Refill  . ALPRAZolam (XANAX) 0.5 MG tablet TAKE ONE TABLET BY MOUTH THREE TIMES DAILY AS NEEDED 90 tablet 0  . cetirizine (ZYRTEC) 10 MG tablet Take 10 mg by mouth daily as needed for allergies.    . Cholecalciferol (VITAMIN D-3) 5000 UNITS TABS Take 15,000 Units by mouth daily.     . fluticasone (FLONASE) 50 MCG/ACT nasal spray Place 2 sprays into both nostrils daily as needed. 16 g 3  . KLOR-CON M20 20 MEQ tablet TAKE ONE TABLET BY MOUTH TWICE DAILY 180 tablet 1  . Lactobacillus (ACIDOPHILUS) 100 MG CAPS Take 1 capsule by mouth daily.    Marland Kitchen levothyroxine (SYNTHROID, LEVOTHROID) 50 MCG tablet TAKE ONE TABLET BY MOUTH BEFORE BREAKFAST 90 tablet 1  . Multiple Vitamins-Minerals (GERITABS PO) Take 1 tablet by mouth daily. Reported on 06/08/2016    . ranitidine (ZANTAC) 300 MG tablet TAKE ONE TABLET BY MOUTH TWICE DAILY 180 tablet 3  . rosuvastatin (CRESTOR) 40 MG tablet TAKE ONE TABLET BY MOUTH ONCE DAILY 90 tablet 1  . tamoxifen (NOLVADEX) 20 MG tablet Take 20 mg by mouth daily.    . triazolam (HALCION) 0.25 MG tablet Take 1 tablet (0.25 mg total) by mouth at bedtime. 30 tablet 2   No current facility-administered medications on file prior to visit.     Allergies  Allergen Reactions  . Lipitor [Atorvastatin]     Muscle and legs aches  . Shellfish Allergy Diarrhea and Nausea And Vomiting  . Betadine  [Povidone Iodine] Rash    Current Problems (verified) Patient Active Problem List   Diagnosis Date Noted  . Encounter for Medicare annual wellness exam 02/19/2017  . Tobacco chew use 05/15/2016  . Malignant neoplasm of upper-outer quadrant of right breast in female, estrogen receptor positive ([place]) 04/21/2016  . Hypothyroidism 04/18/2016  . BMI 29.0-29.9,adult 10/11/2015  . Medication management 09/24/2014  . Cystocele, midline 03/23/2014  . Essential hypertension 12/10/2013  . Mixed hyperlipidemia 12/10/2013  . Prediabetes 12/10/2013  . Vitamin D deficiency 12/10/2013  . DJD  12/10/2013  . DDD, lumbar 12/10/2013  . Fibromyalgia Syndrome 12/10/2013  . Coitalgia 09/21/2011  . Alteration in bowel elimination: incontinence 09/21/2011  . Abnormal female sexual function 09/21/2011    Screening Tests Immunization History  Administered Date(s) Administered  . Influenza Split 09/18/2013, 09/25/2014  . Influenza, Seasonal, Injecte, Preservative Fre 10/11/2015  . Influenza,inj,quad, With Preservative 08/28/2016  . Pneumococcal-Unspecified 11/27/2000  . Tdap 03/16/2011   Preventative care: Last colonoscopy: over  13 years, due- willing to do cologuard Last mammogram: 03/2016 Last pap smear/pelvic exam: 2017 DEXA:N/A CT AB 2014 CT head 2013 Echo 2014 Stress test 2014  Prior vaccinations: TD or Tdap: 2012  Influenza: 2017 Pneumococcal: 2002 Prevnar13: DUE age 29 Shingles/Zostavax: declines  Names of Other Physician/Practitioners you currently use: 1. [facility] here for primary care 2. [facility], eye doctor, last visit 12/2016 3. Dr. Minerva Fester, dentist, last visit 3 years ago  Patient Care Team: Unk Pinto, MD as PCP - General (Internal Medicine) Fay Records, MD as Consulting Physician (Cardiology) [name], MD as Consulting Physician (Orthopedic Surgery) Carolan Clines, MD as Consulting Physician (Urology) Vanessa Kick, MD as Consulting Physician (Obstetrics and Gynecology) Chauncey Cruel, MD as Consulting Physician (Oncology) Rolm Bookbinder, MD as Consulting Physician (General Surgery) Thea Silversmith, MD (Inactive) as Consulting Physician (Radiation Oncology)  SURGICAL HISTORY She  has a past surgical history that includes Incontinence surgery (2009  &  2005); BLADDER TACK (2003); Breast lumpectomy (1985); Vein bypass surgery (AGE 29); transthoracic echocardiogram (06-16-2013); Cardiovascular stress test (07-02-2013  [facility]); CYSTO/  HYDRODISTENTION/  BLADDER BX/   INSTILLATION THERAPY (08-29-2010); Vaginal hysterectomy (2000); Cataract extraction w/ intraocular lens implant (Left, 01/2014); Excision of mesh (N/A, 03/23/2014); Excision vaginal cyst (N/A, 04/06/2014); and Radioactive seed guided mastectomy with axillary sentinel lymph node biopsy (Right, 05/18/2016). FAMILY HISTORY Her family history includes Lung cancer in her brother. SOCIAL HISTORY She  reports that she quit smoking about 21 years ago. Her smoking use included Cigarettes. She quit after 20.00 years of use. Her smokeless tobacco use includes Snuff. She reports that she does not drink alcohol or use drugs.   MEDICARE WELLNESS OBJECTIVES: Physical activity: Current Exercise Habits: The patient does not participate in regular exercise at present Cardiac risk factors: Cardiac Risk Factors include: dyslipidemia;hypertension;obesity (BMI >30kg/m2);sedentary lifestyle Depression/mood screen:   Depression screen [facility] 2/9 02/19/2017  Decreased Interest 0  Down, Depressed, Hopeless 0  PHQ - 2 Score 0    ADLs:  In your present state of health, do you have any difficulty performing the following activities: 02/19/2017 11/15/2016  Hearing? N N  Vision? N N  Difficulty concentrating or making decisions? N N  Walking or climbing stairs? N N  Dressing or bathing? N N  Doing errands, shopping? N N  Some recent data might be hidden      Cognitive Testing  Alert? Yes  Normal Appearance?Yes  Oriented to person? Yes  Place? Yes   Time? Yes  Recall of three objects?  Yes  Can perform simple calculations? Yes  Displays appropriate judgment?Yes  Can read the correct time from a watch face?Yes  EOL planning: Does Patient Have a Medical Advance Directive?: No  Review of Systems  Constitutional: Positive for malaise/fatigue. Negative for chills and fever.  HENT: Negative for congestion, ear pain and sore throat.   Eyes: Negative.   Respiratory: Negative for cough, shortness of breath and wheezing.   Cardiovascular: Negative for chest pain, palpitations and leg swelling.  Gastrointestinal: Negative for abdominal pain, blood in stool, constipation, diarrhea, heartburn and melena.  Genitourinary: Negative.   Musculoskeletal: Positive for back pain and joint pain. Negative for falls, myalgias and neck pain.  Skin: Negative.   Neurological: Negative for dizziness, sensory change, loss of consciousness and headaches.  Psychiatric/Behavioral: Negative for depression. The patient is not nervous/anxious and does not have insomnia.      Objective:     Today's  Vitals   02/19/17 1145  BP: 124/68  Pulse: 77  Resp: 16  Temp: 97.3 F (36.3 C)  SpO2: 98%  Weight: 170 lb 3.2 oz (77.2 kg)  Height: 5' 2.5" (1.588 m)  PainSc: 4   PainLoc: Back   Body mass index is 30.63 kg/m.  General appearance: alert, no distress, WD/WN, female HEENT: normocephalic, sclerae anicteric, TMs pearly, nares patent, no discharge or erythema, pharynx normal Oral cavity: MMM, no lesions Neck: supple, no lymphadenopathy, no thyromegaly, no masses Heart: RRR, normal S1, S2, systolic murmur Lungs: CTA bilaterally, no wheezes, rhonchi, or rales Abdomen: +bs, soft, non tender, non distended, no masses, no hepatomegaly, no splenomegaly Musculoskeletal: nontender, no swelling, no obvious deformity Extremities: no edema, no cyanosis, no  clubbing Pulses: 2+ symmetric, upper and lower extremities, normal cap refill Neurological: alert, oriented x 3, CN2-12 intact, strength normal upper extremities and lower extremities, sensation normal throughout, DTRs 2+ throughout, no cerebellar signs, gait normal Psychiatric: normal affect, behavior normal, pleasant   Medicare Attestation I have personally reviewed: [name] medical and social history Their use of alcohol, tobacco or illicit drugs Their current medications and supplements [name] functional ability including ADLs,fall risks, home safety risks, cognitive, and hearing and visual impairment Diet and physical activities Evidence for depression or mood disorders  [name] weight, height, BMI, and visual acuity have been recorded in the chart.  I have made referrals, counseling, and provided education to the patient based on review of the above and I have provided the patient with a written personalized care plan for preventive services.     Vicie Mutters, PA-C   02/19/2017

## 2017-02-19 NOTE — Patient Instructions (Addendum)
Add ENTERIC COATED low dose 81 mg Aspirin daily OR can do every other day if you have easy bruising to protect your heart and head. As well as to reduce risk of Colon Cancer by 20 %, Skin Cancer by 26 % , Melanoma by 46% and Pancreatic cancer by 60%  Cologuard is an easy to use noninvasive colon cancer screening test based on the latest advances in stool DNA science.   Colon cancer is 3rd most diagnosed cancer and 2nd leading cause of death in both men and women 59 years of age and older despite being one of the most preventable and treatable cancers if found early.  4 of out 5 people diagnosed with colon cancer have NO prior family history.  When caught EARLY 90% of colon cancer is curable.   You have agreed to do a Cologuard screening and have declined a colonoscopy in spite of being explained the risks and benefits of the colonoscopy in detail, including cancer and death. Please understand that this is test not as sensitive or specific as a colonoscopy and you are still recommended to get a colonoscopy.   If you are NOT medicare please call your insurance company and give them these items to see if they will cover it: 1) CPT code, [phone] 2) Provider is Probation officer 3) [employer] NPI [phone] 4) [place] Tax ID [phone]  Out-of-pocket cost for Cologuard can range from $0 - $649 so please call  You will receive a short call from [place] support center at [employer], when you receive a call they will say they are from [place],  to confirm your mailing address and give you more information.  When they calll you, it will appear on the caller ID as "[employer]" or in some cases only this number will appear, [phone].   [employer] will ship your collection kit directly to you. You will collect a single stool sample in the privacy of your own home, no special preparation required. You will return the kit via [place]  pre-paid shipping or pick-up, in the same box it arrived in. Then I will contact you to discuss your results after I receive them from the laboratory.   If you have any questions or concerns, Cologuard Customer Support Specialist are available 24 hours a day, 7 days a week at [phone] or go to [email or web address].     Varicose Veins Varicose veins are veins that have become enlarged and twisted. CAUSES This condition is the result of valves in the veins not working properly. Valves in the veins help return blood from the leg to the heart. When your calf muscles squeeze, the blood moves up your leg then the valves close and this continues until the blood gets back to your heart.  If these valves are damaged, blood flows backwards and backs up into the veins in the leg near the skin OR if your are sitting/standing for a long time without using your calf muscles the blood will back up into the veins in your legs. This causes the veins to become larger. People who are on their feet a lot, sit a lot without walking (like on a plane, at a desk, or in a car), who are pregnant, or who are overweight are more likely to develop varicose veins. SYMPTOMS   Bulging, twisted-appearing, bluish veins, most commonly found on the legs.  Leg pain or a feeling of heaviness. These symptoms may be worse at the  end of the day.  Leg swelling.  Skin color changes. DIAGNOSIS  Varicose veins can usually be diagnosed with an exam of your legs by your caregiver. He or she may recommend an ultrasound of your leg veins. TREATMENT  Most varicose veins can be treated at home.However, other treatments are available for people who have persistent symptoms or who want to treat the cosmetic appearance of the varicose veins. But this is only cosmetic and they will return if not properly treated. These include:  Laser treatment of very small varicose veins.  Medicine that is shot (injected) into the vein. This medicine  hardens the walls of the vein and closes off the vein. This treatment is called sclerotherapy. Afterwards, you may need to wear clothing or bandages that apply pressure.  Surgery. HOME CARE INSTRUCTIONS   Do not stand or sit in one position for long periods of time. Do not sit with your legs crossed. Rest with your legs raised during the day.  Your legs have to be higher than your heart so that gravity will force the valves to open, so please really elevate your legs.   Wear elastic stockings or support hose. Do not wear other tight, encircling garments around the legs, pelvis, or waist.  [employer]  has a wide variety of well priced compression stockings. [place], [place] [id number] [phone] [place] ARE COPPER INFUSED COMPRESSION SOCKS AT [facility] OR [facility]  Walk as much as possible to increase blood flow.  Raise the foot of your bed at night with 2-inch blocks.  If you get a cut in the skin over the vein and the vein bleeds, lie down with your leg raised and press on it with a clean cloth until the bleeding stops. Then place a bandage (dressing) on the cut. See your caregiver if it continues to bleed or needs stitches. SEEK MEDICAL CARE IF:   The skin around your ankle starts to break down.  You have pain, redness, tenderness, or hard swelling developing in your leg over a vein.  You are uncomfortable due to leg pain. Document Released: 08/23/2005 Document Revised: 02/05/2012 Document Reviewed: 01/09/2011 [facility] Patient Information 2014 [place].

## 2017-02-20 LAB — VITAMIN D 25 HYDROXY (VIT D DEFICIENCY, FRACTURES): VIT D 25 HYDROXY: 80 ng/mL (ref 30–100)

## 2017-02-20 LAB — HEMOGLOBIN A1C
Hgb A1c MFr Bld: 5.2 % (ref <5.7)
Mean Plasma Glucose: 103 mg/dL

## 2017-02-20 LAB — MAGNESIUM: Magnesium: 2.1 mg/dL (ref 1.5–2.5)

## 2017-02-20 NOTE — Progress Notes (Signed)
Pt aware of lab results & voiced understanding of those results.

## 2017-03-05 DIAGNOSIS — Z1211 Encounter for screening for malignant neoplasm of colon: Secondary | ICD-10-CM | POA: Diagnosis not present

## 2017-03-05 DIAGNOSIS — Z1212 Encounter for screening for malignant neoplasm of rectum: Secondary | ICD-10-CM | POA: Diagnosis not present

## 2017-03-10 LAB — COLOGUARD

## 2017-03-12 ENCOUNTER — Telehealth: Payer: Self-pay | Admitting: Internal Medicine

## 2017-03-12 NOTE — Telephone Encounter (Signed)
called patient and advised that Cologuard was Negative, per Vicie Mutters and to Repeat in 3 years. Patient acknowledged understanding.

## 2017-03-13 ENCOUNTER — Other Ambulatory Visit: Payer: Self-pay | Admitting: Physician Assistant

## 2017-03-13 DIAGNOSIS — G4700 Insomnia, unspecified: Secondary | ICD-10-CM

## 2017-03-13 MED ORDER — LEVOTHYROXINE SODIUM 75 MCG PO TABS: ORAL_TABLET | ORAL | 1 refills | Status: DC

## 2017-04-17 ENCOUNTER — Encounter: Payer: Self-pay | Admitting: Internal Medicine

## 2017-04-20 ENCOUNTER — Other Ambulatory Visit: Payer: Self-pay | Admitting: Obstetrics and Gynecology

## 2017-04-20 DIAGNOSIS — Z853 Personal history of malignant neoplasm of breast: Secondary | ICD-10-CM

## 2017-04-20 DIAGNOSIS — Z01419 Encounter for gynecological examination (general) (routine) without abnormal findings: Secondary | ICD-10-CM | POA: Diagnosis not present

## 2017-04-20 DIAGNOSIS — Z6830 Body mass index (BMI) 30.0-30.9, adult: Secondary | ICD-10-CM | POA: Diagnosis not present

## 2017-04-25 ENCOUNTER — Ambulatory Visit
Admission: RE | Admit: 2017-04-25 | Discharge: 2017-04-25 | Disposition: A | Discharge status: Home / Self Care | Payer: Medicare Other | Source: Ambulatory Visit | Attending: Obstetrics and Gynecology | Admitting: Obstetrics and Gynecology

## 2017-04-25 ENCOUNTER — Other Ambulatory Visit: Payer: Self-pay | Admitting: Physician Assistant

## 2017-04-25 DIAGNOSIS — G4700 Insomnia, unspecified: Secondary | ICD-10-CM

## 2017-04-25 DIAGNOSIS — Z853 Personal history of malignant neoplasm of breast: Secondary | ICD-10-CM

## 2017-04-25 DIAGNOSIS — R928 Other abnormal and inconclusive findings on diagnostic imaging of breast: Secondary | ICD-10-CM | POA: Diagnosis not present

## 2017-04-25 HISTORY — DX: Personal history of irradiation: Z92.3

## 2017-04-25 NOTE — Telephone Encounter (Signed)
Halcion was called into pharmacy on 30th May 2018 @ 8:19am by DD

## 2017-05-24 ENCOUNTER — Encounter: Payer: Self-pay | Admitting: Internal Medicine

## 2017-05-24 ENCOUNTER — Ambulatory Visit (INDEPENDENT_AMBULATORY_CARE_PROVIDER_SITE_OTHER): Payer: Medicare Other | Admitting: Internal Medicine

## 2017-05-24 VITALS — BP 124/78 | HR 78 | Temp 97.3°F | Resp 16 | Ht 62.5 in | Wt 172.4 lb

## 2017-05-24 DIAGNOSIS — Z79899 Other long term (current) drug therapy: Secondary | ICD-10-CM

## 2017-05-24 DIAGNOSIS — R7303 Prediabetes: Secondary | ICD-10-CM | POA: Diagnosis not present

## 2017-05-24 DIAGNOSIS — E039 Hypothyroidism, unspecified: Secondary | ICD-10-CM

## 2017-05-24 DIAGNOSIS — I10 Essential (primary) hypertension: Secondary | ICD-10-CM

## 2017-05-24 DIAGNOSIS — K219 Gastro-esophageal reflux disease without esophagitis: Secondary | ICD-10-CM | POA: Diagnosis not present

## 2017-05-24 DIAGNOSIS — E782 Mixed hyperlipidemia: Secondary | ICD-10-CM | POA: Diagnosis not present

## 2017-05-24 DIAGNOSIS — M797 Fibromyalgia: Secondary | ICD-10-CM

## 2017-05-24 DIAGNOSIS — E559 Vitamin D deficiency, unspecified: Secondary | ICD-10-CM | POA: Diagnosis not present

## 2017-05-24 LAB — CBC WITH DIFFERENTIAL/PLATELET
BASOS PCT: 0 %
Basophils Absolute: 0 cells/uL (ref 0–200)
EOS PCT: 1 %
Eosinophils Absolute: 70 cells/uL (ref 15–500)
HEMATOCRIT: 38.8 % (ref 35.0–45.0)
HEMOGLOBIN: 13.1 g/dL (ref 11.7–15.5)
LYMPHS ABS: 2730 {cells}/uL (ref 850–3900)
Lymphocytes Relative: 39 %
MCH: 28.4 pg (ref 27.0–33.0)
MCHC: 33.8 g/dL (ref 32.0–36.0)
MCV: 84.2 fL (ref 80.0–100.0)
MONO ABS: 490 {cells}/uL (ref 200–950)
MPV: 9.9 fL (ref 7.5–12.5)
Monocytes Relative: 7 %
NEUTROS ABS: 3710 {cells}/uL (ref 1500–7800)
NEUTROS PCT: 53 %
Platelets: 246 10*3/uL (ref 140–400)
RBC: 4.61 MIL/uL (ref 3.80–5.10)
RDW: 13.8 % (ref 11.0–15.0)
WBC: 7 10*3/uL (ref 3.8–10.8)

## 2017-05-24 LAB — TSH: TSH: 0.84 m[IU]/L

## 2017-05-24 MED ORDER — PREDNISONE 20 MG PO TABS: ORAL_TABLET | ORAL | 2 refills | Status: DC

## 2017-05-24 NOTE — Progress Notes (Signed)
This very nice 61 y.o. MWF presents for 6 month follow up with Hypertension, Hyperlipidemia, Pre-Diabetes and Vitamin D Deficiency.      Patient is on SS Disability since 2005 for Fibromyalgia, Interstitial cystitis and DDD/DJD. She still decries Limb/Girdle am stiffness and myalgias and fatigue.       Patient is treated for HTN (2004) & BP has been controlled at home. Today's BP is at goal - 124/78. Patient has had no complaints of any cardiac type chest pain, palpitations, dyspnea/orthopnea/PND, dizziness, claudication, or dependent edema.     Hyperlipidemia is controlled with diet & meds. Patient denies myalgias or other med SE's. Last Lipids were at goal:  Lab Results  Component Value Date   CHOL 145 02/19/2017   HDL 48 (L) 02/19/2017   LDLCALC 70 02/19/2017   TRIG 135 02/19/2017   CHOLHDL 3.0 02/19/2017      Also, the patient has history of PreDiabetes (A1c 6.1% in 2011) and has had no symptoms of reactive hypoglycemia, diabetic polys, paresthesias or visual blurring.  Last A1c was at goal: Lab Results  Component Value Date   HGBA1C 5.2 02/19/2017      Patient has been on thyroid replacement since 2002. Further, the patient also has history of Vitamin D Deficiency ("11" in 2008) and supplements vitamin D without any suspected side-effects. Last vitamin D was at goal:  Lab Results  Component Value Date   VD25OH 32 02/19/2017   Current Outpatient Prescriptions on File Prior to Visit  Medication Sig  . ALPRAZolam (XANAX) 0.5 MG tablet TAKE ONE TABLET BY MOUTH THREE TIMES DAILY AS NEEDED  . cetirizine (ZYRTEC) 10 MG tablet Take 10 mg by mouth daily as needed for allergies.  . Cholecalciferol (VITAMIN D-3) 5000 UNITS TABS Take 15,000 Units by mouth daily.   . Lactobacillus (ACIDOPHILUS) 100 MG CAPS Take 1 capsule by mouth daily.  Marland Kitchen levothyroxine (SYNTHROID, LEVOTHROID) 75 MCG tablet TAKE ONE TABLET BY MOUTH BEFORE BREAKFAST  . Multiple Vitamins-Minerals (GERITABS PO) Take 1  tablet by mouth daily. Reported on 06/08/2016  . ranitidine (ZANTAC) 300 MG tablet TAKE ONE TABLET BY MOUTH TWICE DAILY  . rosuvastatin (CRESTOR) 40 MG tablet TAKE ONE TABLET BY MOUTH ONCE DAILY  . tamoxifen (NOLVADEX) 20 MG tablet Take 20 mg by mouth daily.  . triazolam (HALCION) 0.25 MG tablet TAKE 1 TABLET BY MOUTH AT BEDTIME   No current facility-administered medications on file prior to visit.    Allergies  Allergen Reactions  . Lipitor [Atorvastatin]     Muscle and legs aches  . Shellfish Allergy Diarrhea and Nausea And Vomiting  . Betadine [Povidone Iodine] Rash   PMHx:   Past Medical History:  Diagnosis Date  . Anxiety   . Breast cancer ([place])   . DDD (degenerative disc disease), lumbar   . Erosion of vaginal mesh ([place])   . Fibromyalgia   . GERD (gastroesophageal reflux disease)   . History of gastric ulcer   . History of radiation therapy 07/06/16- 08/22/16   Right Breast  . Hypertension   . Hypothyroidism   . Personal history of radiation therapy   . Rectocele    Immunization History  Administered Date(s) Administered  . Influenza Split 09/18/2013, 09/25/2014  . Influenza, Seasonal, Injecte, Preservative Fre 10/11/2015  . Influenza,inj,quad, With Preservative 08/28/2016  . Pneumococcal-Unspecified 11/27/2000  . Tdap 03/16/2011   Past Surgical History:  Procedure Laterality Date  . BLADDER TACK  2003  . BREAST BIOPSY Right  04/18/2016  . BREAST LUMPECTOMY  1985  . BREAST LUMPECTOMY Right 05/18/2016  . CARDIOVASCULAR STRESS TEST  07-02-2013  DR DALTON MCLEAN   LOW RISK NUCLEAR STUDY WITH A SMALL, MILD APICAL SEPTAL FIXED PERFUSION DEFECT .  GIVEN NORMAL WALL FUNCTION , SUSPECT REPRESENTS ATTENUATION/  EF 74%/  NO ISCHEMIA  . CATARACT EXTRACTION W/ INTRAOCULAR LENS IMPLANT Left 01/2014  . CYSTO/  HYDRODISTENTION/  BLADDER BX/   INSTILLATION THERAPY  08-29-2010  . EXCISION OF MESH N/A 03/23/2014   Procedure: EXCISION OF VAGINAL AND PERI-URETHRAL MESH, EXTRACTION  FROM URETHRA TO APEX, insertion of XENFORM in the vaginal vault;  Surgeon: Ailene Rud, MD;  Location: [facility];  Service: Urology;  Laterality: N/A;  . EXCISION VAGINAL CYST N/A 04/06/2014   Procedure: VAGINAL SPECULUM EXAMINATION/POSSIBLE DRAINAGE OF HEMATOMA;  Surgeon: Ailene Rud, MD;  Location: [facility];  Service: Urology;  Laterality: N/A;  . INCONTINENCE SURGERY  2009  &  2005  . RADIOACTIVE SEED GUIDED MASTECTOMY WITH AXILLARY SENTINEL LYMPH NODE BIOPSY Right 05/18/2016   Procedure: RADIOACTIVE SEED GUIDED RIGHT BREAST LUMPECTOMY WITH AXILLARY SENTINEL LYMPH NODE BIOPSY;  Surgeon: Rolm Bookbinder, MD;  Location: [place];  Service: General;  Laterality: Right;  RADIOACTIVE SEED GUIDED RIGHT BREAST LUMPECTOMY WITH AXILLARY SENTINEL LYMPH NODE BIOPSY  . TRANSTHORACIC ECHOCARDIOGRAM  06-16-2013   GRADE I DIASTOLIC DYSFUNCTION/  EF 55-60%/  MILD AR/  TRIVIAL MR  &  TR  . VAGINAL HYSTERECTOMY  2000   w/  unilateral salpingoophorectomy  . VEIN BYPASS SURGERY  AGE 50   RIGHT LEG   FHx:    Reviewed / unchanged  SHx:    Reviewed / unchanged  Systems Review:  Constitutional: Denies fever, chills, wt changes, headaches, insomnia, fatigue, night sweats, change in appetite. Eyes: Denies redness, blurred vision, diplopia, discharge, itchy, watery eyes.  ENT: Denies discharge, congestion, post nasal drip, epistaxis, sore throat, earache, hearing loss, dental pain, tinnitus, vertigo, sinus pain, snoring.  CV: Denies chest pain, palpitations, irregular heartbeat, syncope, dyspnea, diaphoresis, orthopnea, PND, claudication or edema. Respiratory: denies cough, dyspnea, DOE, pleurisy, hoarseness, laryngitis, wheezing.  Gastrointestinal: Denies dysphagia, odynophagia, heartburn, reflux, water brash, abdominal pain or cramps, nausea, vomiting, bloating, diarrhea, constipation, hematemesis, melena, hematochezia  or  hemorrhoids. Genitourinary: Denies dysuria, frequency, urgency, nocturia, hesitancy, discharge, hematuria or flank pain. Musculoskeletal: Denies arthralgias, myalgias, stiffness, jt. swelling, pain, limping or strain/sprain.  Skin: Denies pruritus, rash, hives, warts, acne, eczema or change in skin lesion(s). Neuro: No weakness, tremor, incoordination, spasms, paresthesia or pain. Psychiatric: Denies confusion, memory loss or sensory loss. Endo: Denies change in weight, skin or hair change.  Heme/Lymph: No excessive bleeding, bruising or enlarged lymph nodes.  Physical Exam  BP 124/78   Pulse 78   Temp 97.3 F (36.3 C)   Resp 16   Ht 5' 2.5" (1.588 m)   Wt 172 lb 6.4 oz (78.2 kg)   BMI 31.03 kg/m   Appears well nourished, well groomed  and in no distress.  Eyes: PERRLA, EOMs, conjunctiva no swelling or erythema. Sinuses: No frontal/maxillary tenderness ENT/Mouth: EAC's clear, TM's nl w/o erythema, bulging. Nares clear w/o erythema, swelling, exudates. Oropharynx clear without erythema or exudates. Oral hygiene is good. Tongue normal, non obstructing. Hearing intact.  Neck: Supple. Thyroid nl. Car 2+/2+ without bruits, nodes or JVD. Chest: Respirations nl with BS clear & equal w/o rales, rhonchi, wheezing or stridor.  Cor: Heart sounds normal w/ regular rate and rhythm  without sig. murmurs, gallops, clicks or rubs. Peripheral pulses normal and equal  without edema.  Abdomen: Soft & bowel sounds normal. Non-tender w/o guarding, rebound, hernias, masses or organomegaly.  Lymphatics: Unremarkable.  Musculoskeletal: Full ROM all peripheral extremities, joint stability, 5/5 strength and normal gait.  Skin: Warm, dry without exposed rashes, lesions or ecchymosis apparent.  Neuro: Cranial nerves intact, reflexes equal bilaterally. Sensory-motor testing grossly intact. Tendon reflexes grossly intact.  Pysch: Alert & oriented x 3.  Insight and judgement nl & appropriate. No  ideations.  Assessment and Plan:  1. Essential hypertension  - Continue medication, monitor blood pressure at home.  - Continue DASH diet. Reminder to go to the ER if any CP,  SOB, nausea, dizziness, severe HA, changes vision/speech  - CBC with Differential/Platelet - BASIC METABOLIC PANEL WITH GFR - Magnesium - TSH  2. Hyperlipidemia, mixed  - Continue diet/meds, exercise,& lifestyle modifications.  - Continue monitor periodic cholesterol/liver & renal functions   - Hepatic function panel - Lipid panel - TSH  3. Prediabetes  - Continue diet, exercise, lifestyle modifications.  - Monitor appropriate labs.  - Hemoglobin A1c - Insulin, random  4. Vitamin D deficiency  - Continue supplementation.  - VITAMIN D 25 Hydroxy   5. Hypothyroidism  - TSH  6. Gastroesophageal reflux disease   7. Fibromyalgia Syndrome   8. Medication management  - CBC with Differential/Platelet - BASIC METABOLIC PANEL WITH GFR - Hepatic function panel - Magnesium - Lipid panel - TSH - Hemoglobin A1c - Insulin, random - VITAMIN D 25 Hydroxy       Discussed  regular exercise, BP monitoring, weight control to achieve/maintain BMI less than 25 and discussed med and SE's. Recommended labs to assess and monitor clinical status with further disposition pending results of labs. Over 30 minutes of exam, counseling, chart review was performed.

## 2017-05-24 NOTE — Patient Instructions (Signed)

## 2017-05-25 LAB — HEMOGLOBIN A1C
Hgb A1c MFr Bld: 5.4 % (ref <5.7)
Mean Plasma Glucose: 108 mg/dL

## 2017-05-25 LAB — BASIC METABOLIC PANEL WITH GFR
BUN: 11 mg/dL (ref 7–25)
CHLORIDE: 103 mmol/L (ref 98–110)
CO2: 24 mmol/L (ref 20–31)
Calcium: 8.8 mg/dL (ref 8.6–10.4)
Creat: 0.6 mg/dL (ref 0.50–1.05)
GFR, Est African American: >89 mL/min (ref >=60)
GLUCOSE: 82 mg/dL (ref 65–99)
POTASSIUM: 4 mmol/L (ref 3.5–5.3)
SODIUM: 139 mmol/L (ref 135–146)

## 2017-05-25 LAB — LIPID PANEL
Cholesterol: 132 mg/dL (ref <200)
HDL: 54 mg/dL (ref >50)
LDL CALC: 59 mg/dL (ref <100)
Total CHOL/HDL Ratio: 2.4 Ratio (ref <5.0)
Triglycerides: 93 mg/dL (ref <150)
VLDL: 19 mg/dL (ref <30)

## 2017-05-25 LAB — HEPATIC FUNCTION PANEL
ALK PHOS: 49 U/L (ref 33–130)
ALT: 27 U/L (ref 6–29)
AST: 25 U/L (ref 10–35)
Albumin: 4.1 g/dL (ref 3.6–5.1)
BILIRUBIN DIRECT: 0.1 mg/dL (ref <=0.2)
BILIRUBIN INDIRECT: 0.3 mg/dL (ref 0.2–1.2)
TOTAL PROTEIN: 6.3 g/dL (ref 6.1–8.1)
Total Bilirubin: 0.4 mg/dL (ref 0.2–1.2)

## 2017-05-25 LAB — VITAMIN D 25 HYDROXY (VIT D DEFICIENCY, FRACTURES): Vit D, 25-Hydroxy: 77 ng/mL (ref 30–100)

## 2017-05-25 LAB — INSULIN, RANDOM: INSULIN: 12.6 u[IU]/mL (ref 2.0–19.6)

## 2017-05-25 LAB — MAGNESIUM: Magnesium: 2.1 mg/dL (ref 1.5–2.5)

## 2017-07-02 ENCOUNTER — Other Ambulatory Visit: Payer: Self-pay

## 2017-07-02 DIAGNOSIS — Z170 Estrogen receptor positive status [ER+]: Secondary | ICD-10-CM

## 2017-07-02 DIAGNOSIS — C50411 Malignant neoplasm of upper-outer quadrant of right female breast: Secondary | ICD-10-CM

## 2017-07-03 ENCOUNTER — Other Ambulatory Visit (HOSPITAL_BASED_OUTPATIENT_CLINIC_OR_DEPARTMENT_OTHER): Payer: Medicare Other

## 2017-07-03 ENCOUNTER — Ambulatory Visit (HOSPITAL_BASED_OUTPATIENT_CLINIC_OR_DEPARTMENT_OTHER): Payer: Medicare Other | Admitting: Oncology

## 2017-07-03 VITALS — BP 142/65 | HR 80 | Temp 97.9°F | Resp 18 | Ht 62.5 in | Wt 172.5 lb

## 2017-07-03 DIAGNOSIS — C50411 Malignant neoplasm of upper-outer quadrant of right female breast: Secondary | ICD-10-CM

## 2017-07-03 DIAGNOSIS — Z923 Personal history of irradiation: Secondary | ICD-10-CM

## 2017-07-03 DIAGNOSIS — Z79811 Long term (current) use of aromatase inhibitors: Secondary | ICD-10-CM | POA: Diagnosis not present

## 2017-07-03 DIAGNOSIS — N951 Menopausal and female climacteric states: Secondary | ICD-10-CM

## 2017-07-03 DIAGNOSIS — Z170 Estrogen receptor positive status [ER+]: Secondary | ICD-10-CM | POA: Diagnosis not present

## 2017-07-03 LAB — COMPREHENSIVE METABOLIC PANEL
ALK PHOS: 55 U/L (ref 40–150)
ALT: 37 U/L (ref 0–55)
ANION GAP: 11 meq/L (ref 3–11)
AST: 22 U/L (ref 5–34)
Albumin: 3.7 g/dL (ref 3.5–5.0)
BUN: 11.2 mg/dL (ref 7.0–26.0)
CO2: 27 mEq/L (ref 22–29)
Calcium: 9.2 mg/dL (ref 8.4–10.4)
Chloride: 107 mEq/L (ref 98–109)
Creatinine: 0.9 mg/dL (ref 0.6–1.1)
EGFR: 72 mL/min/{1.73_m2} — AB (ref >90)
Glucose: 142 mg/dl — ABNORMAL HIGH (ref 70–140)
POTASSIUM: 3.8 meq/L (ref 3.5–5.1)
Sodium: 144 mEq/L (ref 136–145)
TOTAL PROTEIN: 6.7 g/dL (ref 6.4–8.3)
Total Bilirubin: 0.29 mg/dL (ref 0.20–1.20)

## 2017-07-03 LAB — CBC WITH DIFFERENTIAL/PLATELET
BASO%: 0.3 % (ref 0.0–2.0)
BASOS ABS: 0 10*3/uL (ref 0.0–0.1)
EOS ABS: 0 10*3/uL (ref 0.0–0.5)
EOS%: 0.2 % (ref 0.0–7.0)
HEMATOCRIT: 41.9 % (ref 34.8–46.6)
HGB: 13.6 g/dL (ref 11.6–15.9)
LYMPH#: 5.6 10*3/uL — AB (ref 0.9–3.3)
LYMPH%: 39.2 % (ref 14.0–49.7)
MCH: 27.6 pg (ref 25.1–34.0)
MCHC: 32.4 g/dL (ref 31.5–36.0)
MCV: 85.3 fL (ref 79.5–101.0)
MONO#: 0.7 10*3/uL (ref 0.1–0.9)
MONO%: 5.1 % (ref 0.0–14.0)
NEUT#: 7.9 10*3/uL — ABNORMAL HIGH (ref 1.5–6.5)
NEUT%: 55.2 % (ref 38.4–76.8)
PLATELETS: 257 10*3/uL (ref 145–400)
RBC: 4.91 10*6/uL (ref 3.70–5.45)
RDW: 13.6 % (ref 11.2–14.5)
WBC: 14.3 10*3/uL — ABNORMAL HIGH (ref 3.9–10.3)

## 2017-07-03 LAB — DRAW EXTRA CLOT TUBE

## 2017-07-03 MED ORDER — TAMOXIFEN CITRATE 20 MG PO TABS: 20.0000 mg | ORAL_TABLET | Freq: Every day | ORAL | 4 refills | Status: DC

## 2017-07-03 NOTE — Progress Notes (Signed)
[facility]  [phone] [phone]     ID: Desiree Anderson DOB: July 01, 1956  MR#: [id number]  PPC#:[id number]  Patient Care Team: Lucky Cowboy, MD as PCP - General (Internal Medicine) Pricilla Riffle, MD as Consulting Physician (Cardiology) Eldred Manges, MD as Consulting Physician (Orthopedic Surgery) Jethro Bolus, MD as Consulting Physician (Urology) Waynard Reeds, MD as Consulting Physician (Obstetrics and Gynecology) Magrinat, Valentino Hue, MD as Consulting Physician (Oncology) Emelia Loron, MD as Consulting Physician (General Surgery) Lurline Hare, MD (Inactive) as Consulting Physician (Radiation Oncology) PCP: Lucky Cowboy, MD GYN: OTHER MD:  CHIEF COMPLAINT: Estrogen receptor positive invasive breast cancer  CURRENT TREATMENT: Tamoxifen   BREAST CANCER HISTORY: From the original intake note:  Desiree Anderson had routine screening mammography showing a possible area of distortion in the right breast. On 04/18/2016 she had diagnostic right mammography with tomography and [facility] Breast density was category C. In the upper outer quadrant of the right breast there was a persistent area of architectural distortion which was not palpable on exam. Ultrasonography confirmed a 1.0 cm irregular mass at the 11:00 position 3 cm from the nipple. There was no right axillary lymphadenopathy by ultrasonography.  Biopsy of the right breast mass in question was obtained the same day and showed (S [phone]) an invasive ductal carcinoma, grade 2, estrogen and progesterone receptor both 100% positive with strong staining intensity, with an MIB-1 of 5%, and no HER-2 amplification, the signals ratio being 1.57, the number per cell 3.85.  Her subsequent history is as detailed below  INTERVAL HISTORY: Desiree Anderson returns today for follow-up and treatment of her estrogen receptor positive breast cancer. The interval  history is generally unremarkable. She is tolerating tamoxifen well. She does have "whole-body hot flashes", which last up to 5 minutes. They are not frequent. Vaginal wetness is not a major issue. She obtains a drug at a good price.  REVIEW OF SYSTEMS: She has had sinus problems which are currently being treated with decongestants, steroids, and Flonase. She denies a fever. She had a viral infection including severe diarrhea over the past week but that is getting better she says. She is not exercising. A detailed review of systems today was otherwise stable  PAST MEDICAL HISTORY: Past Medical History:  Diagnosis Date  . Anxiety   . Breast cancer ([facility])   . DDD (degenerative disc disease), lumbar   . Erosion of vaginal mesh ([facility])   . Fibromyalgia   . GERD (gastroesophageal reflux disease)   . History of gastric ulcer   . History of radiation therapy 07/06/16- 08/22/16   Right Breast  . Hypertension   . Hypothyroidism   . Personal history of radiation therapy   . Rectocele     PAST SURGICAL HISTORY: Past Surgical History:  Procedure Laterality Date  . BLADDER TACK  2003  . BREAST BIOPSY Right 04/18/2016  . BREAST LUMPECTOMY  1985  . BREAST LUMPECTOMY Right 05/18/2016  . CARDIOVASCULAR STRESS TEST  07-02-2013  DR DALTON MCLEAN   LOW RISK NUCLEAR STUDY WITH A SMALL, MILD APICAL SEPTAL FIXED PERFUSION DEFECT .  GIVEN NORMAL WALL FUNCTION , SUSPECT REPRESENTS ATTENUATION/  EF 74%/  NO ISCHEMIA  . CATARACT EXTRACTION W/ INTRAOCULAR LENS IMPLANT Left 01/2014  . CYSTO/  HYDRODISTENTION/  BLADDER BX/   INSTILLATION THERAPY  08-29-2010  . EXCISION OF MESH N/A 03/23/2014   Procedure: EXCISION OF VAGINAL AND PERI-URETHRAL MESH, EXTRACTION FROM URETHRA TO APEX, insertion of XENFORM  in the vaginal vault;  Surgeon: Ailene Rud, MD;  Location: [facility];  Service: Urology;  Laterality: N/A;  . EXCISION VAGINAL CYST N/A 04/06/2014   Procedure: VAGINAL SPECULUM  EXAMINATION/POSSIBLE DRAINAGE OF HEMATOMA;  Surgeon: Ailene Rud, MD;  Location: [facility];  Service: Urology;  Laterality: N/A;  . INCONTINENCE SURGERY  2009  &  2005  . RADIOACTIVE SEED GUIDED MASTECTOMY WITH AXILLARY SENTINEL LYMPH NODE BIOPSY Right 05/18/2016   Procedure: RADIOACTIVE SEED GUIDED RIGHT BREAST LUMPECTOMY WITH AXILLARY SENTINEL LYMPH NODE BIOPSY;  Surgeon: Rolm Bookbinder, MD;  Location: [place];  Service: General;  Laterality: Right;  RADIOACTIVE SEED GUIDED RIGHT BREAST LUMPECTOMY WITH AXILLARY SENTINEL LYMPH NODE BIOPSY  . TRANSTHORACIC ECHOCARDIOGRAM  06-16-2013   GRADE I DIASTOLIC DYSFUNCTION/  EF 55-60%/  MILD AR/  TRIVIAL MR  &  TR  . VAGINAL HYSTERECTOMY  2000   w/  unilateral salpingoophorectomy  . VEIN BYPASS SURGERY  AGE 51   RIGHT LEG    FAMILY HISTORY Family History  Problem Relation Age of Onset  . Lung cancer Brother   [name] father died at the age of 18 from a myocardial infarction. He was not a smoker however. [name] mother died with renal failure at the age of 87. The patient had 4 brothers, 3 sisters. The only cancer in the family was one brother diagnosed with lung cancer at the age of 72. There is no history of breast or ovarian cancer in the family.  GYNECOLOGIC HISTORY:  No LMP recorded. Patient has had an ablation. Menarche age 14, first live birth age 57, the patient is Desiree Anderson Desiree Anderson. She stopped having periods approximately 2003. She took hormone replacement approximately 3 years. She also used birth control pills remotely for approximately 20 years, with no complications.  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 Her husband Desiree Anderson family business, produce an plants, in [employer], for 17 years. He is now retired. Desiree Anderson lives in [place] and is a homemaker. Desiree Anderson lives in [place] and has multiple health problems. The patient has 2 granddaughters. She also has 2 grandchildren who have died. She is a Furniture conservator/restorer.    ADVANCED DIRECTIVES: Not in place   HEALTH MAINTENANCE: Social History  Substance Use Topics  . Smoking status: Former Smoker    Years: 20.00    Types: Cigarettes    Quit date: 11/28/1995  . Smokeless tobacco: Current User    Types: Snuff     Comment: dips daily  . Alcohol use No     Colonoscopy: 2007/ [place]  PAP:  Bone density: 2012/ [facility]  Lipid panel:  Allergies  Allergen Reactions  . Iodinated Diagnostic Agents   . Lipitor [Atorvastatin]     Muscle and legs aches  . Shellfish Allergy Diarrhea and Nausea And Vomiting  . Betadine [Povidone Iodine] Rash    Current Outpatient Prescriptions  Medication Sig Dispense Refill  . ALPRAZolam (XANAX) 0.5 MG tablet TAKE ONE TABLET BY MOUTH THREE TIMES DAILY AS NEEDED 90 tablet 0  . cetirizine (ZYRTEC) 10 MG tablet Take 10 mg by mouth daily as needed for allergies.    . Cholecalciferol (VITAMIN D-3) 5000 UNITS TABS Take 15,000 Units by mouth daily.     Marland Kitchen estradiol (ESTRACE VAGINAL) 0.1 MG/GM vaginal cream Estrace  0.01% (0.1 mg/gram) vaginal cream    . fluticasone (FLONASE) 50 MCG/ACT nasal spray fluticasone 50 mcg/actuation nasal spray,suspension    . Lactobacillus (ACIDOPHILUS) 100 MG CAPS Take 1 capsule by mouth daily.    Marland Kitchen levothyroxine (SYNTHROID, LEVOTHROID) 75 MCG tablet TAKE ONE TABLET BY MOUTH BEFORE BREAKFAST 90 tablet 1  . lidocaine (XYLOCAINE) 5 % ointment lidocaine 5 % topical ointment    . Multiple Vitamins-Minerals (GERITABS PO) Take 1 tablet by mouth daily. Reported on 06/08/2016    . potassium chloride SA (KLOR-CON M20) 20 MEQ tablet Klor-Con M20 mEq tablet,extended release    . predniSONE (DELTASONE) 20 MG tablet 1 tab 3 x day for 3 days, then 1 tab 2 x day for 3 days, then 1 tab 1 x day for 5 days 20 tablet 2  . ranitidine (ZANTAC) 300 MG tablet TAKE ONE TABLET BY MOUTH  TWICE DAILY 180 tablet 3  . rosuvastatin (CRESTOR) 40 MG tablet TAKE ONE TABLET BY MOUTH ONCE DAILY 90 tablet 1  . tamoxifen (NOLVADEX) 20 MG tablet Take 20 mg by mouth daily.    . triazolam (HALCION) 0.25 MG tablet TAKE 1 TABLET BY MOUTH AT BEDTIME 30 tablet 2   No current facility-administered medications for this visit.     OBJECTIVE: Middle-aged white womanWho appears well  Vitals:   07/03/17 1310  BP: (!) 142/65  Pulse: 80  Resp: 18  Temp: 97.9 F (36.6 C)     Body mass index is 31.05 kg/m.    ECOG FS:1 - Symptomatic but completely ambulatory  Sclerae unicteric, pupils round and equal Oropharynx clear and moist No cervical or supraclavicular adenopathy Lungs no rales or rhonchi Heart regular rate and rhythm Abd soft, nontender, positive bowel sounds MSK no focal spinal tenderness, no upper extremity lymphedema Neuro: nonfocal, well oriented, appropriate affect Breasts: The right breast is status post lumpectomy followed by radiation with no evidence of local recurrence. The left breast is benign. Both axillae are benign.  LAB RESULTS:  CMP     Component Value Date/Time   NA 144 07/03/2017 1241   K 3.8 07/03/2017 1241   CL 103 05/24/2017 1238   CO2 27 07/03/2017 1241   GLUCOSE 142 (H) 07/03/2017 1241   BUN 11.2 07/03/2017 1241   CREATININE 0.9 07/03/2017 1241   CALCIUM 9.2 07/03/2017 1241   PROT 6.7 07/03/2017 1241   ALBUMIN 3.7 07/03/2017 1241   AST 22 07/03/2017 1241   ALT 37 07/03/2017 1241   ALKPHOS 55 07/03/2017 1241   BILITOT 0.29 07/03/2017 1241   GFRNONAA >89 05/24/2017 1238   GFRAA >89 05/24/2017 1238    INo results found for: SPEP, UPEP  Lab Results  Component Value Date   WBC 14.3 (H) 07/03/2017   NEUTROABS 7.9 (H) 07/03/2017   HGB 13.6 07/03/2017   HCT 41.9 07/03/2017   MCV 85.3 07/03/2017   PLT 257 07/03/2017      Chemistry      Component Value Date/Time   NA 144 07/03/2017 1241   K 3.8 07/03/2017 1241   CL 103 05/24/2017 1238    CO2 27 07/03/2017 1241   BUN 11.2 07/03/2017 1241   CREATININE 0.9 07/03/2017 1241      Component Value Date/Time   CALCIUM 9.2 07/03/2017 1241   ALKPHOS 55 07/03/2017 1241   AST 22 07/03/2017 1241   ALT 37 07/03/2017 1241   BILITOT 0.29 07/03/2017 1241       No results found for: LABCA2  No components  found for: [id number]  No results for input(s): INR in the last 168 hours.  Urinalysis    Component Value Date/Time   COLORURINE DARK YELLOW 11/15/2016 1436   APPEARANCEUR CLOUDY (A) 11/15/2016 1436   LABSPEC 1.026 11/15/2016 1436   PHURINE 5.5 11/15/2016 1436   GLUCOSEU NEGATIVE 11/15/2016 1436   HGBUR NEGATIVE 11/15/2016 1436   BILIRUBINUR NEGATIVE 11/15/2016 1436   KETONESUR TRACE (A) 11/15/2016 1436   PROTEINUR TRACE (A) 11/15/2016 1436   NITRITE NEGATIVE 11/15/2016 1436   LEUKOCYTESUR NEGATIVE 11/15/2016 1436      ELIGIBLE FOR AVAILABLE RESEARCH PROTOCOL: no  STUDIES: Bilateral diagnostic mammography with tomography at the [facility] 04/25/2017 found the breast density to be category C. There was no evidence of malignancy.  ASSESSMENT: 61 y.o. [place] woman status post right breast upper outer quadrant biopsy 04/18/2016 for a clinical T1b N0, stage IA invasive ductal carcinoma, grade 2, estrogen and progesterone receptor strongly positive, HER-2 nonamplified, with an MIB-1 of 5%.   (1) Status post right lumpectomy and sentinel lymph node sampling 05/18/2016 for a pT1c pN0, stage IA invasive ductal carcinoma, grade 1, with negative margins, Repeat HER-2 again negative  (2) Oncotype DX score of 12 predicts a 10 year risk of recurrence outside the breast of 8% the patient's only systemic therapy is tamoxifen for 5 years. It also protects no benefit from adjuvant chemotherapy. He  (3) adjuvant radiation 07/06/2016-08/22/2016  (4) started tamoxifen 09/27/2016  PLAN: [place] is now a little over one year out from definitive surgery for her breast cancer with no  evidence of disease recurrence. This is favorable.  She is tolerating tamoxifen well. The plan will be to continue that for total of 5 years.  I have encouraged her to return to her water aerobics and other water exercises.  She does have a leukocytosis. This is likely related to her current sinus symptoms. If her symptoms do not resolve within the next few days she will contact her primary care physician for consideration of an antibiotic.  Otherwise she will return to see me in one year. She knows to call for any problems that may develop before that visit.    Chauncey Cruel, MD   07/03/2017 1:27 PM Medical Oncology and Hematology [facility] [address] [place], [place] [address] Tel. [phone]    Fax. [phone]

## 2017-07-04 ENCOUNTER — Encounter: Payer: Self-pay | Admitting: Internal Medicine

## 2017-07-04 ENCOUNTER — Ambulatory Visit (INDEPENDENT_AMBULATORY_CARE_PROVIDER_SITE_OTHER): Payer: Medicare Other | Admitting: Internal Medicine

## 2017-07-04 VITALS — BP 126/82 | HR 88 | Temp 97.4°F | Resp 18 | Ht 62.5 in | Wt 170.2 lb

## 2017-07-04 DIAGNOSIS — J0140 Acute pansinusitis, unspecified: Secondary | ICD-10-CM | POA: Diagnosis not present

## 2017-07-04 DIAGNOSIS — J0410 Acute tracheitis without obstruction: Secondary | ICD-10-CM | POA: Diagnosis not present

## 2017-07-04 MED ORDER — PREDNISONE 20 MG PO TABS: ORAL_TABLET | ORAL | 0 refills | Status: DC

## 2017-07-04 MED ORDER — AZITHROMYCIN 250 MG PO TABS: ORAL_TABLET | ORAL | 1 refills | Status: DC

## 2017-07-04 NOTE — Progress Notes (Signed)
Subjective:    Patient ID: Desiree Anderson, female    DOB: December 03, 1955, 61 y.o.   MRN: [id number]  HPI    This nice 61 yo MWF with HTN, HLD, preDM, and Hx/o Breast cancer c/o gradually worsening 1 week prodrome of head & chest congestion & yellowish green nasal secretions/drainage & sputum. Denies fevers, chills, rash, dyspnea Had recent CBC w/ Dr Jana Hakim with elev WBC 14 K.   Medication Sig  . ALPRAZolam  0.5 MG tablet TAKE ONE TABLET BY MOUTH THREE TIMES DAILY AS NEEDED  . cetirizine 10 MG tablet Take 10 mg by mouth daily as needed for allergies.  Marland Kitchen VITAMIN D 5000 UNITS Take 15,000 Units by mouth daily.   Marland Kitchen FLONASE  nasal spray fluticasone 50 mcg/actuation nasal spray,suspension  . levothyroxine 75 MCG tablet TAKE ONE TABLET BY MOUTH BEFORE BREAKFAST  . GERITABS Take 1 tablet by mouth daily. Reported on 06/08/2016  . KLOR-CON20 MEQ  Klor-Con M20 mEq tablet,extended release  . ranitidine  300 MG tablet TAKE ONE TABLET BY MOUTH TWICE DAILY  . rosuvastatin 40 MG tablet TAKE ONE TABLET BY MOUTH ONCE DAILY  . tamoxifen  20 MG tablet Take 1 tablet (20 mg total) by mouth daily.  . triazolam  0.25 MG tablet TAKE 1 TABLET BY MOUTH AT BEDTIME   Allergies  Allergen Reactions  . Iodinated Diagnostic Agents   . Lipitor [Atorvastatin]     Muscle and legs aches  . Shellfish Allergy Diarrhea and Nausea And Vomiting  . Betadine [Povidone Iodine] Rash   Past Medical History:  Diagnosis Date  . Anxiety   . Breast cancer ([place])   . DDD (degenerative disc disease), lumbar   . Erosion of vaginal mesh ([place])   . Fibromyalgia   . GERD (gastroesophageal reflux disease)   . History of gastric ulcer   . History of radiation therapy 07/06/16- 08/22/16   Right Breast  . Hypertension   . Hypothyroidism   . Personal history of radiation therapy   . Rectocele    Past Surgical History:  Procedure Laterality Date  . BLADDER TACK  2003  . BREAST BIOPSY Right 04/18/2016  . BREAST LUMPECTOMY  1985  . BREAST  LUMPECTOMY Right 05/18/2016  . CARDIOVASCULAR STRESS TEST  07-02-2013  DR DALTON MCLEAN   LOW RISK NUCLEAR STUDY WITH A SMALL, MILD APICAL SEPTAL FIXED PERFUSION DEFECT .  GIVEN NORMAL WALL FUNCTION , SUSPECT REPRESENTS ATTENUATION/  EF 74%/  NO ISCHEMIA  . CATARACT EXTRACTION W/ INTRAOCULAR LENS IMPLANT Left 01/2014  . CYSTO/  HYDRODISTENTION/  BLADDER BX/   INSTILLATION THERAPY  08-29-2010  . EXCISION OF MESH N/A 03/23/2014   Procedure: EXCISION OF VAGINAL AND PERI-URETHRAL MESH, EXTRACTION FROM URETHRA TO APEX, insertion of XENFORM in the vaginal vault;  Surgeon: Ailene Rud, MD;  Location: [facility];  Service: Urology;  Laterality: N/A;  . EXCISION VAGINAL CYST N/A 04/06/2014   Procedure: VAGINAL SPECULUM EXAMINATION/POSSIBLE DRAINAGE OF HEMATOMA;  Surgeon: Ailene Rud, MD;  Location: [facility];  Service: Urology;  Laterality: N/A;  . INCONTINENCE SURGERY  2009  &  2005  . RADIOACTIVE SEED GUIDED MASTECTOMY WITH AXILLARY SENTINEL LYMPH NODE BIOPSY Right 05/18/2016   Procedure: RADIOACTIVE SEED GUIDED RIGHT BREAST LUMPECTOMY WITH AXILLARY SENTINEL LYMPH NODE BIOPSY;  Surgeon: Rolm Bookbinder, MD;  Location: [place];  Service: General;  Laterality: Right;  RADIOACTIVE SEED GUIDED RIGHT BREAST LUMPECTOMY WITH AXILLARY SENTINEL LYMPH NODE BIOPSY  . TRANSTHORACIC  ECHOCARDIOGRAM  06-16-2013   GRADE I DIASTOLIC DYSFUNCTION/  EF 55-60%/  MILD AR/  TRIVIAL MR  &  TR  . VAGINAL HYSTERECTOMY  2000   w/  unilateral salpingoophorectomy  . VEIN BYPASS SURGERY  AGE 89   RIGHT LEG   Review of Systems  10 point systems review negative except as above.    Objective:   Physical Exam  BP 126/82   Pulse 88   Temp (!) 97.4 F (36.3 C)   Resp 18   Ht 5' 2.5" (1.588 m)   Wt 170 lb 3.2 oz (77.2 kg)   BMI 30.63 kg/m    Dry raspy cough. No Stridor.   HEENT - Eac's patent. TM's Nl. EOM's full. PERRLA. (+) tender frontal/maxillary areas.  NasoOroPharynx clear. Neck - supple. Nl Thyroid. Carotids 2+ & No bruits, nodes, JVD Chest - BS = with few scattered dry rales and no rhonchi, wheezes. Cor - Nl HS. RRR w/o sig MGR.  MS- FROM w/o deformities.  Gait Nl. Neuro - No obvious Cr N abnormalities. Nl w/o focal abnormalities.    Assessment & Plan:   1. Acute non-recurrent pansinusitis   2. Tracheitis  - predniSONE20 MG tablet; 1 tab 3 x day for 3 days, then 1 tab 2 x day for 3 days, then 1 tab 1 x day for 5 days  Dispense: 20 tablet; Refill: 0  - azithromycin (ZITHROMAX) 250 MG tablet; Take 2 tablets (500 mg) on  Day 1,  followed by 1 tablet (250 mg) once daily on Days 2 through 5.  Dispense: 6 each; Refill: 1  - discussed meds/SE's.

## 2017-08-05 ENCOUNTER — Other Ambulatory Visit: Payer: Self-pay | Admitting: Physician Assistant

## 2017-08-05 DIAGNOSIS — G4700 Insomnia, unspecified: Secondary | ICD-10-CM

## 2017-08-06 ENCOUNTER — Other Ambulatory Visit: Payer: Self-pay | Admitting: Physician Assistant

## 2017-08-06 DIAGNOSIS — G4700 Insomnia, unspecified: Secondary | ICD-10-CM

## 2017-09-02 ENCOUNTER — Other Ambulatory Visit: Payer: Self-pay | Admitting: Physician Assistant

## 2017-09-02 DIAGNOSIS — G4700 Insomnia, unspecified: Secondary | ICD-10-CM

## 2017-09-02 NOTE — Progress Notes (Signed)
Patient ID: Desiree Anderson, female   DOB: 1956/10/18, 61 y.o.   MRN: [id number]  Assessment and Plan:  Hypertension:  -Continue medication,  -monitor blood pressure at home.  -Continue DASH diet.   -Reminder to go to the ER if any CP, SOB, nausea, dizziness, severe HA, changes vision/speech, left arm numbness and tingling, and jaw pain.  Cholesterol: -Continue diet and exercise.  -Check cholesterol.   Pre-diabetes: -Continue diet and exercise.  -Check A1C  Vitamin D Def: -check level -continue medications.   Abdominal pain Benign AB, RUQ pain, frequent stools, negative cologuard but ? Colitis, will refer to GI   Continue diet and meds as discussed. Further disposition pending results of labs.  HPI 61 y.o. female  presents for 3 month follow up with hypertension, hyperlipidemia, prediabetes and vitamin D.   Her blood pressure has been controlled at home, today their BP is BP: 128/74.   She does not workout. She denies chest pain, shortness of breath, dizziness.  She feels like the pain in her bladder has been an issue.     She is on cholesterol medication and denies myalgias. Her cholesterol is at goal. The cholesterol last visit was:   Lab Results  Component Value Date   CHOL 132 05/24/2017   HDL 54 05/24/2017   LDLCALC 59 05/24/2017   TRIG 93 05/24/2017   CHOLHDL 2.4 05/24/2017   She is on thyroid medication. Her medication was not changed last visit.   Lab Results  Component Value Date   TSH 0.84 05/24/2017  .   She has been working on diet and exercise for prediabetes, and denies foot ulcerations, hyperglycemia, hypoglycemia , increased appetite, nausea, paresthesia of the feet, polydipsia, polyuria, visual disturbances, vomiting and weight loss. Last A1C in the office was:  Lab Results  Component Value Date   HGBA1C 5.4 05/24/2017   Patient is on Vitamin D supplement.  Lab Results  Component Value Date   VD25OH 21 05/24/2017     She has had chronic diarrhea int  he past, states last 2 months has been worse, she states as soon as she eats, has AB cramping and with BM, solid stool unless she eats greens or greasy food, occ pellets coming out, large BM in the morning but less through out the day.   She is having around 4-6 bowel movements per day.    No blood in stools no black colored stools.  She had negative cologuard 02/2017. She has not had weight loss, no fever, chills. No floating/fatty stool. Has had fecal incontinence but rare, having urgency with it.  BMI is Body mass index is 31.25 kg/m., she is working on diet and exercise. Wt Readings from Last 3 Encounters:  09/04/17 173 lb 9.6 oz (78.7 kg)  07/04/17 170 lb 3.2 oz (77.2 kg)  07/03/17 172 lb 8 oz (78.2 kg)      Current Medications:  Current Outpatient Prescriptions on File Prior to Visit  Medication Sig Dispense Refill  . ALPRAZolam (XANAX) 0.5 MG tablet TAKE ONE TABLET BY MOUTH THREE TIMES DAILY AS NEEDED 90 tablet 0  . cetirizine (ZYRTEC) 10 MG tablet Take 10 mg by mouth daily as needed for allergies.    . Cholecalciferol (VITAMIN D-3) 5000 UNITS TABS Take 15,000 Units by mouth daily.     . fluticasone (FLONASE) 50 MCG/ACT nasal spray fluticasone 50 mcg/actuation nasal spray,suspension    . levothyroxine (SYNTHROID, LEVOTHROID) 75 MCG tablet TAKE 1 TABLET BY MOUTH ONCE DAILY BEFORE BREAKFAST  90 tablet 1  . Multiple Vitamins-Minerals (GERITABS PO) Take 1 tablet by mouth daily. Reported on 06/08/2016    . potassium chloride SA (KLOR-CON M20) 20 MEQ tablet Klor-Con M20 mEq tablet,extended release    . predniSONE (DELTASONE) 20 MG tablet 1 tab 3 x day for 3 days, then 1 tab 2 x day for 3 days, then 1 tab 1 x day for 5 days 20 tablet 0  . ranitidine (ZANTAC) 300 MG tablet TAKE ONE TABLET BY MOUTH TWICE DAILY 180 tablet 3  . rosuvastatin (CRESTOR) 40 MG tablet TAKE ONE TABLET BY MOUTH ONCE DAILY 90 tablet 1  . tamoxifen (NOLVADEX) 20 MG tablet Take 1 tablet (20 mg total) by mouth daily. 90 tablet  4  . triazolam (HALCION) 0.25 MG tablet TAKE 1 TABLET BY MOUTH AT BEDTIME 30 tablet 2   No current facility-administered medications on file prior to visit.     Medical History:  Past Medical History:  Diagnosis Date  . Anxiety   . Breast cancer ([place])   . DDD (degenerative disc disease), lumbar   . Erosion of vaginal mesh ([place])   . Fibromyalgia   . GERD (gastroesophageal reflux disease)   . History of gastric ulcer   . History of radiation therapy 07/06/16- 08/22/16   Right Breast  . Hypertension   . Hypothyroidism   . Personal history of radiation therapy   . Rectocele     Allergies:  Allergies  Allergen Reactions  . Iodinated Diagnostic Agents   . Lipitor [Atorvastatin]     Muscle and legs aches  . Shellfish Allergy Diarrhea and Nausea And Vomiting  . Betadine [Povidone Iodine] Rash     Review of Systems:  Review of Systems  Constitutional: Negative for chills, fever and malaise/fatigue.  HENT: Negative for congestion, ear pain and sore throat.   Eyes: Negative.   Respiratory: Negative for cough, shortness of breath and wheezing.   Cardiovascular: Negative for chest pain, palpitations and leg swelling.  Gastrointestinal: Positive for abdominal pain and diarrhea. Negative for blood in stool, constipation, heartburn and melena.  Genitourinary: Negative for dysuria, frequency, hematuria and urgency.  Musculoskeletal: Positive for back pain.  Skin: Negative.   Neurological: Negative for dizziness, sensory change, loss of consciousness and headaches.    Family history- Review and unchanged  Social history- Review and unchanged  Physical Exam: BP 128/74   Pulse 80   Temp (!) 97 F (36.1 C)   Resp 14   Ht 5' 2.5" (1.588 m)   Wt 173 lb 9.6 oz (78.7 kg)   SpO2 99%   BMI 31.25 kg/m  Wt Readings from Last 3 Encounters:  09/04/17 173 lb 9.6 oz (78.7 kg)  07/04/17 170 lb 3.2 oz (77.2 kg)  07/03/17 172 lb 8 oz (78.2 kg)    General Appearance: Well nourished well  developed, in no apparent distress. Eyes: PERRLA, EOMs, conjunctiva no swelling or erythema ENT/Mouth: Ear canals normal without obstruction, swelling, erythma, discharge.  TMs normal bilaterally.  Oropharynx moist, clear, without exudate, or postoropharyngeal swelling. Neck: Supple, thyroid normal,no cervical adenopathy  Respiratory: Respiratory effort normal, Breath sounds clear A&P without rhonchi, wheeze, or rale.  No retractions, no accessory usage. Cardio: RRR with no MRGs. Brisk peripheral pulses without edema.  Abdomen: Soft, + BS, RUQ tenderness and LLQ tenderness, no guarding, rebound, hernias, masses. Musculoskeletal: Full ROM, 5/5 strength, Normal gait Skin: Warm, dry without rashes, lesions, ecchymosis.  Neuro: Awake and oriented X 3, Cranial nerves intact. Normal  muscle tone, no cerebellar symptoms. Psych: Normal affect, Insight and Judgment appropriate.     Vicie Mutters, PA-C 11:48 AM [facility]

## 2017-09-04 ENCOUNTER — Encounter: Payer: Self-pay | Admitting: Physician Assistant

## 2017-09-04 ENCOUNTER — Ambulatory Visit (INDEPENDENT_AMBULATORY_CARE_PROVIDER_SITE_OTHER): Payer: Medicare Other | Admitting: Physician Assistant

## 2017-09-04 VITALS — BP 128/74 | HR 80 | Temp 97.0°F | Resp 14 | Ht 62.5 in | Wt 173.6 lb

## 2017-09-04 DIAGNOSIS — I10 Essential (primary) hypertension: Secondary | ICD-10-CM | POA: Diagnosis not present

## 2017-09-04 DIAGNOSIS — R7303 Prediabetes: Secondary | ICD-10-CM | POA: Diagnosis not present

## 2017-09-04 DIAGNOSIS — Z23 Encounter for immunization: Secondary | ICD-10-CM | POA: Diagnosis not present

## 2017-09-04 DIAGNOSIS — Z79899 Other long term (current) drug therapy: Secondary | ICD-10-CM | POA: Diagnosis not present

## 2017-09-04 DIAGNOSIS — E039 Hypothyroidism, unspecified: Secondary | ICD-10-CM

## 2017-09-04 DIAGNOSIS — R109 Unspecified abdominal pain: Secondary | ICD-10-CM | POA: Diagnosis not present

## 2017-09-04 DIAGNOSIS — E782 Mixed hyperlipidemia: Secondary | ICD-10-CM | POA: Diagnosis not present

## 2017-09-04 MED ORDER — METRONIDAZOLE 500 MG PO TABS
500.0000 mg | ORAL_TABLET | Freq: Three times a day (TID) | ORAL | 0 refills | Status: AC
Start: 2017-09-04 — End: 2017-09-11

## 2017-09-04 MED ORDER — CIPROFLOXACIN HCL 500 MG PO TABS: 500.0000 mg | ORAL_TABLET | Freq: Two times a day (BID) | ORAL | 0 refills | Status: AC

## 2017-09-04 NOTE — Patient Instructions (Signed)

## 2017-09-05 ENCOUNTER — Other Ambulatory Visit: Payer: Self-pay | Admitting: Physician Assistant

## 2017-09-05 ENCOUNTER — Encounter: Payer: Self-pay | Admitting: Gastroenterology

## 2017-09-05 DIAGNOSIS — R945 Abnormal results of liver function studies: Principal | ICD-10-CM

## 2017-09-05 DIAGNOSIS — R1011 Right upper quadrant pain: Secondary | ICD-10-CM

## 2017-09-05 DIAGNOSIS — R7989 Other specified abnormal findings of blood chemistry: Secondary | ICD-10-CM

## 2017-09-05 DIAGNOSIS — E039 Hypothyroidism, unspecified: Secondary | ICD-10-CM

## 2017-09-05 LAB — BASIC METABOLIC PANEL WITH GFR
BUN: 12 mg/dL (ref 7–25)
CO2: 25 mmol/L (ref 20–32)
CREATININE: 0.62 mg/dL (ref 0.50–1.05)
Calcium: 9 mg/dL (ref 8.6–10.4)
Chloride: 104 mmol/L (ref 98–110)
GFR, EST NON AFRICAN AMERICAN: 99 mL/min/{1.73_m2} (ref >=60)
GFR, Est African American: 114 mL/min/{1.73_m2} (ref >=60)
GLUCOSE: 83 mg/dL (ref 65–99)
Potassium: 4.1 mmol/L (ref 3.5–5.3)
Sodium: 140 mmol/L (ref 135–146)

## 2017-09-05 LAB — CBC WITH DIFFERENTIAL/PLATELET
BASOS PCT: 0.4 %
Basophils Absolute: 31 cells/uL (ref 0–200)
EOS ABS: 77 {cells}/uL (ref 15–500)
EOS PCT: 1 %
HEMATOCRIT: 37.9 % (ref 35.0–45.0)
HEMOGLOBIN: 12.7 g/dL (ref 11.7–15.5)
LYMPHS ABS: 3565 {cells}/uL (ref 850–3900)
MCH: 27.7 pg (ref 27.0–33.0)
MCHC: 33.5 g/dL (ref 32.0–36.0)
MCV: 82.8 fL (ref 80.0–100.0)
MONOS PCT: 6.2 %
MPV: 10.6 fL (ref 7.5–12.5)
NEUTROS ABS: 3550 {cells}/uL (ref 1500–7800)
Neutrophils Relative %: 46.1 %
Platelets: 272 10*3/uL (ref 140–400)
RBC: 4.58 10*6/uL (ref 3.80–5.10)
RDW: 12.5 % (ref 11.0–15.0)
Total Lymphocyte: 46.3 %
WBC mixed population: 477 cells/uL (ref 200–950)
WBC: 7.7 10*3/uL (ref 3.8–10.8)

## 2017-09-05 LAB — HEPATIC FUNCTION PANEL
AG RATIO: 1.9 (calc) (ref 1.0–2.5)
ALBUMIN MSPROF: 4 g/dL (ref 3.6–5.1)
ALKALINE PHOSPHATASE (APISO): 48 U/L (ref 33–130)
ALT: 45 U/L — ABNORMAL HIGH (ref 6–29)
AST: 36 U/L — ABNORMAL HIGH (ref 10–35)
BILIRUBIN TOTAL: 0.4 mg/dL (ref 0.2–1.2)
Bilirubin, Direct: 0.1 mg/dL (ref 0.0–0.2)
GLOBULIN: 2.1 g/dL (ref 1.9–3.7)
Indirect Bilirubin: 0.3 mg/dL (calc) (ref 0.2–1.2)
TOTAL PROTEIN: 6.1 g/dL (ref 6.1–8.1)

## 2017-09-05 LAB — LIPID PANEL
CHOL/HDL RATIO: 2.8 (calc) (ref <5.0)
CHOLESTEROL: 138 mg/dL (ref <200)
HDL: 50 mg/dL — AB (ref >50)
LDL CHOLESTEROL (CALC): 71 mg/dL
Non-HDL Cholesterol (Calc): 88 mg/dL (calc) (ref <130)
TRIGLYCERIDES: 89 mg/dL (ref <150)

## 2017-09-05 LAB — HEMOGLOBIN A1C
EAG (MMOL/L): 6.2 (calc)
HEMOGLOBIN A1C: 5.5 %{Hb} (ref <5.7)
MEAN PLASMA GLUCOSE: 111 (calc)

## 2017-09-05 LAB — TSH: TSH: 0.23 mIU/L — ABNORMAL LOW (ref 0.40–4.50)

## 2017-09-07 NOTE — Progress Notes (Signed)
Pt aware of lab results & voiced understanding of those results. Pt already has appts for [place] & 30mth lab visit.

## 2017-09-12 ENCOUNTER — Ambulatory Visit
Admission: RE | Admit: 2017-09-12 | Discharge: 2017-09-12 | Disposition: A | Discharge status: Home / Self Care | Payer: Medicare Other | Source: Ambulatory Visit | Attending: Physician Assistant | Admitting: Physician Assistant

## 2017-09-12 DIAGNOSIS — R7989 Other specified abnormal findings of blood chemistry: Secondary | ICD-10-CM | POA: Diagnosis not present

## 2017-09-12 DIAGNOSIS — R1011 Right upper quadrant pain: Secondary | ICD-10-CM

## 2017-09-12 DIAGNOSIS — R945 Abnormal results of liver function studies: Principal | ICD-10-CM

## 2017-09-14 NOTE — Progress Notes (Signed)
Pt aware of lab results & voiced understanding of those results.

## 2017-10-08 ENCOUNTER — Ambulatory Visit: Payer: Medicare Other

## 2017-10-08 DIAGNOSIS — R7989 Other specified abnormal findings of blood chemistry: Secondary | ICD-10-CM

## 2017-10-08 DIAGNOSIS — E039 Hypothyroidism, unspecified: Secondary | ICD-10-CM

## 2017-10-08 DIAGNOSIS — R945 Abnormal results of liver function studies: Secondary | ICD-10-CM

## 2017-10-08 DIAGNOSIS — Z79899 Other long term (current) drug therapy: Secondary | ICD-10-CM | POA: Diagnosis not present

## 2017-10-08 DIAGNOSIS — R1011 Right upper quadrant pain: Secondary | ICD-10-CM | POA: Diagnosis not present

## 2017-10-08 LAB — HEPATIC FUNCTION PANEL
AG RATIO: 2 (calc) (ref 1.0–2.5)
ALBUMIN MSPROF: 4.1 g/dL (ref 3.6–5.1)
ALT: 27 U/L (ref 6–29)
AST: 23 U/L (ref 10–35)
Alkaline phosphatase (APISO): 46 U/L (ref 33–130)
BILIRUBIN INDIRECT: 0.2 mg/dL (ref 0.2–1.2)
Bilirubin, Direct: 0.1 mg/dL (ref 0.0–0.2)
GLOBULIN: 2.1 g/dL (ref 1.9–3.7)
TOTAL PROTEIN: 6.2 g/dL (ref 6.1–8.1)
Total Bilirubin: 0.3 mg/dL (ref 0.2–1.2)

## 2017-10-08 LAB — TSH: TSH: 0.59 mIU/L (ref 0.40–4.50)

## 2017-10-08 NOTE — Progress Notes (Signed)
Pt presents for Hepatic function panel & TSH. Pt reports she takes her levothyroxine as follows TUES & THURS-takes 1/2 tablet & 1 whole tablet the other 5 days.

## 2017-10-09 NOTE — Progress Notes (Signed)
Pt aware of lab results & voiced understanding of those results.

## 2017-10-11 ENCOUNTER — Other Ambulatory Visit: Payer: Self-pay | Admitting: Internal Medicine

## 2017-10-30 ENCOUNTER — Other Ambulatory Visit: Payer: Self-pay | Admitting: Physician Assistant

## 2017-10-30 ENCOUNTER — Encounter: Payer: Self-pay | Admitting: Gastroenterology

## 2017-10-30 ENCOUNTER — Ambulatory Visit: Payer: Medicare Other | Admitting: Gastroenterology

## 2017-10-30 VITALS — BP 122/88 | HR 82 | Wt 172.0 lb

## 2017-10-30 DIAGNOSIS — R1030 Lower abdominal pain, unspecified: Secondary | ICD-10-CM | POA: Diagnosis not present

## 2017-10-30 DIAGNOSIS — K824 Cholesterolosis of gallbladder: Secondary | ICD-10-CM | POA: Diagnosis not present

## 2017-10-30 DIAGNOSIS — K580 Irritable bowel syndrome with diarrhea: Secondary | ICD-10-CM

## 2017-10-30 DIAGNOSIS — G4700 Insomnia, unspecified: Secondary | ICD-10-CM

## 2017-10-30 MED ORDER — HYOSCYAMINE SULFATE 0.125 MG SL SUBL: 0.1250 mg | SUBLINGUAL_TABLET | Freq: Four times a day (QID) | SUBLINGUAL | 1 refills | Status: DC | PRN

## 2017-10-30 NOTE — Patient Instructions (Addendum)
If you are age 59 or older, your body mass index should be between 23-30. Your Body mass index is 30.96 kg/m. If this is out of the aforementioned range listed, please consider follow up with your Primary Care Provider.  If you are age 53 or younger, your body mass index should be between 19-25. Your Body mass index is 30.96 kg/m. If this is out of the aformentioned range listed, please consider follow up with your Primary Care Provider.   We will send your records to [facility]. They should contact you to scheduled. If you don't hear from them in 2 weeks please call the office [phone]  Food Guidelines for a sensitive stomach  Many people have difficulty digesting certain foods, causing a variety of distressing and embarrassing symptoms such as abdominal pain, bloating and gas.  These foods may need to be avoided or consumed in small amounts.  Here are some tips that might be helpful for you.  1.   Lactose intolerance is the difficulty or complete inability to digest lactose, the natural sugar in milk and anything made from milk.  This condition is harmless, common, and can begin any time during life.  Some people can digest a modest amount of lactose while others cannot tolerate any.  Also, not all dairy products contain equal amounts of lactose.  For example, hard cheeses such as parmesan have less lactose than soft cheeses such as cheddar.  Yogurt has less lactose than milk or cheese.  Many packaged foods (even many brands of bread) have milk, so read ingredient lists carefully.  It is difficult to test for lactose intolerance, so just try avoiding lactose as much as possible for a week and see what happens with your symptoms.  If you seem to be lactose intolerant, the best plan is to avoid it (but make sure you get calcium from another source).  The next best thing is to use lactase enzyme supplements, available over the counter everywhere.  Just know that many lactose intolerant  people need to take several tablets with each serving of dairy to avoid symptoms.  Lastly, a lot of restaurant food is made with milk or butter.  Many are things you might not suspect, such as mashed potatoes, rice and pasta (cooked with butter) and "grilled" items.  If you are lactose intolerant, it never hurts to ask your server what has milk or butter.  2.   Fiber is an important part of your diet, but not all fiber is well-tolerated.  Insoluble fiber such as bran is often consumed by normal gut bacteria and converted into gas.  Soluble fiber such as oats, squash, carrots and green beans are typically tolerated better.  3.   Some types of carbohydrates can be poorly digested.  Examples include: fructose (apples, cherries, pears, raisins and other dried fruits), fructans (onions, zucchini, large amounts of wheat), sorbitol/mannitol/xylitol and sucralose/Splenda (common artificial sweeteners), and raffinose (lentils, broccoli, cabbage, asparagus, brussel sprouts, many types of beans).  Do a Development worker, community for [employer] and you will find helpful information. Beano, a dietary supplement, will often help with raffinose-containing foods.  As with lactase tablets, you may need several per serving.  4.   Whenever possible, avoid processed food&meats and chemical additives.  High fructose corn syrup, a common sweetener, may be difficult to digest.  Eggs and soy (comes from the soybean, and added to many foods now) are the other most common bloating/gassy foods.  - Dr. Herma Ard  Gastroenterology

## 2017-10-30 NOTE — Progress Notes (Signed)
[facility] Consult Note:  History: Desiree Anderson 10/30/2017  Referring physician: Unk Pinto, MD  Reason for consult/chief complaint: Abdominal Pain (Has improved since making appt. Dr. Estill Bamberg prescribed Cipro and Flagyl which helped. Pt finished several weeks ago. Pain was located in lower abdomen right before and during BM Has "sore spot" in epigastric area.); Diarrhea (Soon after eating. 98m-1 hour after.); and Melena   Subjective  HPI:  This is a 61 year old woman not previously seen by this clinic, referred by primary care noted above for abdominal pain and diarrhea.  She reports many years of IBS symptoms with tendencies to postprandial urgency and semi-formed stool.  Symptoms were escalating a few months ago to the point that she was having severe bandlike lower abdominal pain with continued urgency and loose stool.  She was seen by primary care, who thought perhaps there was colitis, and they prescribed ciprofloxacin and metronidazole.  Desiree Anderson states that her abdominal pain was considerably improved with that treatment, and she is back to her baseline IBS symptoms.  Her appetite is generally good, she denies weight loss or rectal bleeding.  She denies dysphagia, odynophagia, nausea or vomiting. Many years ago she was treated with cholestyramine by a physician at [facility], and she seems to feel that made her IBS much better.  However, she found it difficult to take that medicine.  Earlier this year she had a colon guard test done for colon cancer screening because she did not want to have a colonoscopy out of concern that it might "cause problems".  She believes her IBS symptoms either began with or made worse by some pelvic surgery for cystocele/rectocele and subsequent mesh removal.   ROS:  Review of Systems  Constitutional: Negative for appetite change and unexpected weight change.  HENT: Negative for mouth sores and voice change.   Eyes: Negative for pain  and redness.  Respiratory: Negative for cough and shortness of breath.   Cardiovascular: Negative for chest pain and palpitations.  Genitourinary: Negative for dysuria and hematuria.  Musculoskeletal: Negative for arthralgias and myalgias.  Skin: Negative for pallor and rash.  Neurological: Negative for weakness and headaches.  Hematological: Negative for adenopathy.     Past Medical History: Past Medical History:  Diagnosis Date  . Anxiety   . Breast cancer ([place])   . DDD (degenerative disc disease), lumbar   . Erosion of vaginal mesh ([place])   . Fibromyalgia   . GERD (gastroesophageal reflux disease)   . History of gastric ulcer   . History of radiation therapy 07/06/16- 08/22/16   Right Breast  . Hypertension   . Hypothyroidism   . Personal history of radiation therapy   . Rectocele    She is now a year out from her breast cancer treatment  Past Surgical History: Past Surgical History:  Procedure Laterality Date  . BLADDER TACK  2003  . BREAST BIOPSY Right 04/18/2016  . BREAST LUMPECTOMY  1985  . BREAST LUMPECTOMY Right 05/18/2016  . CARDIOVASCULAR STRESS TEST  07-02-2013  DR DALTON MCLEAN   LOW RISK NUCLEAR STUDY WITH A SMALL, MILD APICAL SEPTAL FIXED PERFUSION DEFECT .  GIVEN NORMAL WALL FUNCTION , SUSPECT REPRESENTS ATTENUATION/  EF 74%/  NO ISCHEMIA  . CATARACT EXTRACTION W/ INTRAOCULAR LENS IMPLANT Left 01/2014  . CYSTO/  HYDRODISTENTION/  BLADDER BX/   INSTILLATION THERAPY  08-29-2010  . EXCISION OF MESH N/A 03/23/2014   Procedure: EXCISION OF VAGINAL AND PERI-URETHRAL MESH, EXTRACTION FROM URETHRA TO APEX, insertion of  XENFORM in the vaginal vault;  Surgeon: Ailene Rud, MD;  Location: [facility];  Service: Urology;  Laterality: N/A;  . EXCISION VAGINAL CYST N/A 04/06/2014   Procedure: VAGINAL SPECULUM EXAMINATION/POSSIBLE DRAINAGE OF HEMATOMA;  Surgeon: Ailene Rud, MD;  Location: [facility]  Service: Urology;   Laterality: N/A;  . INCONTINENCE SURGERY  2009  &  2005  . RADIOACTIVE SEED GUIDED PARTIAL MASTECTOMY WITH AXILLARY SENTINEL LYMPH NODE BIOPSY Right 05/18/2016   Procedure: RADIOACTIVE SEED GUIDED RIGHT BREAST LUMPECTOMY WITH AXILLARY SENTINEL LYMPH NODE BIOPSY;  Surgeon: Rolm Bookbinder, MD;  Location: [facility];  Service: General;  Laterality: Right;  RADIOACTIVE SEED GUIDED RIGHT BREAST LUMPECTOMY WITH AXILLARY SENTINEL LYMPH NODE BIOPSY  . TRANSTHORACIC ECHOCARDIOGRAM  06-16-2013   GRADE I DIASTOLIC DYSFUNCTION/  EF 55-60%/  MILD AR/  TRIVIAL MR  &  TR  . VAGINAL HYSTERECTOMY  2000   w/  unilateral salpingoophorectomy  . VEIN BYPASS SURGERY  AGE 72   RIGHT LEG     Family History: Family History  Problem Relation Age of Onset  . Diabetes Mother   . Diverticulitis Mother   . Lung cancer Brother   . Intestinal polyp Sister     Social History: Social History   Socioeconomic History  . Marital status: Married    Spouse name: None  . Number of children: 2  . Years of education: None  . Highest education level: None  Social Needs  . Financial resource strain: None  . Food insecurity - worry: None  . Food insecurity - inability: None  . Transportation needs - medical: None  . Transportation needs - non-medical: None  Occupational History  . None  Tobacco Use  . Smoking status: Former Smoker    Years: 20.00    Types: Cigarettes    Last attempt to quit: 11/28/1995    Years since quitting: 21.9  . Smokeless tobacco: Current User    Types: Snuff  . Tobacco comment: dips daily  Substance and Sexual Activity  . Alcohol use: No  . Drug use: No  . Sexual activity: None  Other Topics Concern  . None  Social History Narrative  . None    Allergies: Allergies  Allergen Reactions  . Iodinated Diagnostic Agents   . Lipitor [Atorvastatin]     Muscle and legs aches  . Shellfish Allergy Diarrhea and Nausea And Vomiting  . Betadine [Povidone Iodine] Rash     Outpatient Meds: Current Outpatient Medications  Medication Sig Dispense Refill  . ALPRAZolam (XANAX) 0.5 MG tablet TAKE ONE TABLET BY MOUTH THREE TIMES DAILY AS NEEDED 90 tablet 0  . cetirizine (ZYRTEC) 10 MG tablet Take 10 mg by mouth daily as needed for allergies.    . Cholecalciferol (VITAMIN D-3) 5000 UNITS TABS Take 15,000 Units by mouth daily.     . fluticasone (FLONASE) 50 MCG/ACT nasal spray fluticasone 50 mcg/actuation nasal spray,suspension    . KLOR-CON M20 20 MEQ tablet TAKE 1 TABLET BY MOUTH TWICE DAILY 180 tablet 1  . levothyroxine (SYNTHROID, LEVOTHROID) 75 MCG tablet TAKE 1 TABLET BY MOUTH ONCE DAILY BEFORE BREAKFAST (Patient taking differently: TAKE 1 TABLET BY MOUTH ONCE DAILY BEFORE BREAKFAST MWFSS, .5 tab Tues and Thur) 90 tablet 1  . Multiple Vitamins-Minerals (GERITABS PO) Take 1 tablet by mouth daily. Reported on 06/08/2016    . ranitidine (ZANTAC) 300 MG tablet TAKE ONE TABLET BY MOUTH TWICE DAILY 180 tablet 3  . rosuvastatin (CRESTOR)  40 MG tablet TAKE ONE TABLET BY MOUTH ONCE DAILY 90 tablet 1  . tamoxifen (NOLVADEX) 20 MG tablet Take 1 tablet (20 mg total) by mouth daily. 90 tablet 4  . triazolam (HALCION) 0.25 MG tablet TAKE 1 TABLET BY MOUTH AT BEDTIME 30 tablet 2  . hyoscyamine (LEVSIN SL) 0.125 MG SL tablet Place 1 tablet (0.125 mg total) under the tongue every 6 (six) hours as needed. 45 tablet 1   No current facility-administered medications for this visit.       ___________________________________________________________________ Objective   Exam:  BP 122/88   Pulse 82   Wt 172 lb (78 kg)   BMI 30.96 kg/m    General: this is a(n) well-appearing woman  Eyes: sclera anicteric, no redness  ENT: oral mucosa moist without lesions, no cervical or supraclavicular lymphadenopathy, good dentition  CV: RRR without murmur, S1/S2, no JVD, no peripheral edema  Resp: clear to auscultation bilaterally, normal RR and effort noted  GI: soft, no  tenderness, with active bowel sounds. No guarding or palpable organomegaly noted.  Skin; warm and dry, no rash or jaundice noted  Neuro: awake, alert and oriented x 3. Normal gross motor function and fluent speech  Labs:  CMP Latest Ref Rng & Units 10/08/2017 09/04/2017 07/03/2017  Glucose 65 - 99 mg/dL - 83 142(H)  BUN 7 - 25 mg/dL - 12 11.2  Creatinine 0.50 - 1.05 mg/dL - 0.62 0.9  Sodium 135 - 146 mmol/L - 140 144  Potassium 3.5 - 5.3 mmol/L - 4.1 3.8  Chloride 98 - 110 mmol/L - 104 -  CO2 20 - 32 mmol/L - 25 27  Calcium 8.6 - 10.4 mg/dL - 9.0 9.2  Total Protein 6.1 - 8.1 g/dL 6.2 6.1 6.7  Total Bilirubin 0.2 - 1.2 mg/dL 0.3 0.4 0.29  Alkaline Phos 40 - 150 U/L - - 55  AST 10 - 35 U/L 23 36(H) 22  ALT 6 - 29 U/L 27 45(H) 37     Radiologic Studies:  [place] 10/17 - 1cm GB polyp and small perihepatic ascites  CT 2014 with sig tics  Assessment: Encounter Diagnoses  Name Primary?  . Irritable bowel syndrome with diarrhea Yes  . Lower abdominal pain   . Gall bladder polyp     It sounds as if she has underlying IBS, but then probably had a few weeks of smoldering diverticulitis accounting for the escalation of symptoms.  It got much better with antibiotic therapy and she is back to baseline now.  She is still against having a colonoscopy at this point.  Plan:  I gave her some IBS dietary advice, a trial of Levsin, and referred her to general surgery for consideration of elective cholecystectomy because her gallbladder polyp is increased to 10 mm, increasing the risk of gallbladder malignancy.  We will make plans for her to follow-up with me in clinic to see how her IBS symptoms go on Levsin  Thank you for the courtesy of this consult.  Please call me with any questions or concerns.  Nelida Meuse III  CC: Unk Pinto, MD

## 2017-10-31 ENCOUNTER — Other Ambulatory Visit: Payer: Self-pay | Admitting: Physician Assistant

## 2017-10-31 DIAGNOSIS — G4700 Insomnia, unspecified: Secondary | ICD-10-CM

## 2017-10-31 NOTE — Telephone Encounter (Signed)
Rx called in 

## 2017-11-01 ENCOUNTER — Telehealth: Payer: Self-pay

## 2017-11-01 NOTE — Telephone Encounter (Signed)
Records faxed to [facility]. Will await appointment info 

## 2017-11-01 NOTE — Telephone Encounter (Signed)
Rx called in 

## 2017-11-08 NOTE — Telephone Encounter (Signed)
Pt has been scheduled for 11-16-2017 @ 910am with Dr. Donne Hazel.

## 2017-11-16 ENCOUNTER — Other Ambulatory Visit: Payer: Self-pay | Admitting: General Surgery

## 2017-11-16 DIAGNOSIS — K824 Cholesterolosis of gallbladder: Secondary | ICD-10-CM | POA: Diagnosis not present

## 2017-11-26 ENCOUNTER — Other Ambulatory Visit: Payer: Self-pay | Admitting: Physician Assistant

## 2017-12-05 NOTE — Pre-Procedure Instructions (Addendum)
Madina Galati  12/05/2017      [facility] ([facility] [place] [address] [place] ([place]) [place] [address] Phone: [phone] Fax: [phone]    Your procedure is scheduled on Monday January 14.  Report to [facility] Admitting at 7:30 A.M.  Call this number if you have problems the morning of surgery:  [phone]   Remember:  Do not eat food or drink liquids after midnight.  **DRINK 2 Ensure Pre-surgery Drinks the night before surgery**  **Drink 1 Ensure Pre-surgery Drink the morning of surgery prior to leaving home.**   Take these medicines the morning of surgery with A SIP OF WATER:   Cetirizine (zyrtec) Levothyroxine (synthroid) Ranitidine (zantac) Alprazolam (xanax) if needed flonase if needed Tamofixen (nolvadex)  7 days prior to surgery STOP taking any Aspirin(unless otherwise instructed by your surgeon), Aleve, Naproxen, Ibuprofen, Motrin, Advil, Goody's, BC's, all herbal medications, fish oil, and all vitamins   Do not wear jewelry, make-up or nail polish.  Do not wear lotions, powders, or perfumes, or deodorant.  Do not shave 48 hours prior to surgery.  Men may shave face and neck.  Do not bring valuables to the hospital.  [facility] is not responsible for any belongings or valuables.  Contacts, dentures or bridgework may not be worn into surgery.  Leave your suitcase in the car.  After surgery it may be brought to your room.  For patients admitted to the hospital, discharge time will be determined by your treatment team.  Patients discharged the day of surgery will not be allowed to drive home.   Special instructions:    [name]- Preparing For Surgery  Before surgery, you can play an important role. Because skin is not sterile, your skin needs to be as free of germs as possible. You can reduce the number of germs on your skin by washing with CHG (chlorahexidine gluconate) Soap before surgery.  CHG  is an antiseptic cleaner which kills germs and bonds with the skin to continue killing germs even after washing.  Please do not use if you have an allergy to CHG or antibacterial soaps. If your skin becomes reddened/irritated stop using the CHG.  Do not shave (including legs and underarms) for at least 48 hours prior to first CHG shower. It is OK to shave your face.  Please follow these instructions carefully.   1. Shower the NIGHT BEFORE SURGERY and the MORNING OF SURGERY with CHG.   2. If you chose to wash your hair, wash your hair first as usual with your normal shampoo.  3. After you shampoo, rinse your hair and body thoroughly to remove the shampoo.  4. Use CHG as you would any other liquid soap. You can apply CHG directly to the skin and wash gently with a scrungie or a clean washcloth.   5. Apply the CHG Soap to your body ONLY FROM THE NECK DOWN.  Do not use on open wounds or open sores. Avoid contact with your eyes, ears, mouth and genitals (private parts). Wash Face and genitals (private parts)  with your normal soap.  6. Wash thoroughly, paying special attention to the area where your surgery will be performed.  7. Thoroughly rinse your body with warm water from the neck down.  8. DO NOT shower/wash with your normal soap after using and rinsing off the CHG Soap.  9. Pat yourself dry with a CLEAN TOWEL.  10. Wear CLEAN  PAJAMAS to bed the night before surgery, wear comfortable clothes the morning of surgery  11. Place CLEAN SHEETS on your bed the night of your first shower and DO NOT SLEEP WITH PETS.    Day of Surgery: Do not apply any deodorants/lotions. Please wear clean clothes to the hospital/surgery center.      Please read over the following fact sheets that you were given. Coughing and Deep Breathing and Surgical Site Infection Prevention

## 2017-12-06 ENCOUNTER — Encounter (HOSPITAL_COMMUNITY)
Admission: RE | Admit: 2017-12-06 | Discharge: 2017-12-06 | Disposition: A | Discharge status: Home / Self Care | Payer: Medicare Other | Source: Ambulatory Visit | Attending: General Surgery | Admitting: General Surgery

## 2017-12-06 ENCOUNTER — Encounter (HOSPITAL_COMMUNITY): Payer: Self-pay

## 2017-12-06 DIAGNOSIS — I517 Cardiomegaly: Secondary | ICD-10-CM | POA: Diagnosis not present

## 2017-12-06 DIAGNOSIS — Z01818 Encounter for other preprocedural examination: Secondary | ICD-10-CM | POA: Diagnosis not present

## 2017-12-06 DIAGNOSIS — I10 Essential (primary) hypertension: Secondary | ICD-10-CM | POA: Insufficient documentation

## 2017-12-06 HISTORY — DX: Adverse effect of unspecified anesthetic, initial encounter: T41.45XA

## 2017-12-06 HISTORY — DX: Other complications of anesthesia, initial encounter: T88.59XA

## 2017-12-06 LAB — CBC
HCT: 42.4 % (ref 36.0–46.0)
Hemoglobin: 13.3 g/dL (ref 12.0–15.0)
MCH: 26.8 pg (ref 26.0–34.0)
MCHC: 31.4 g/dL (ref 30.0–36.0)
MCV: 85.5 fL (ref 78.0–100.0)
PLATELETS: 246 10*3/uL (ref 150–400)
RBC: 4.96 MIL/uL (ref 3.87–5.11)
RDW: 13.1 % (ref 11.5–15.5)
WBC: 8.6 10*3/uL (ref 4.0–10.5)

## 2017-12-06 LAB — GLUCOSE, CAPILLARY: GLUCOSE-CAPILLARY: 87 mg/dL (ref 65–99)

## 2017-12-06 LAB — BASIC METABOLIC PANEL
Anion gap: 8 (ref 5–15)
BUN: 10 mg/dL (ref 6–20)
CALCIUM: 9 mg/dL (ref 8.9–10.3)
CO2: 26 mmol/L (ref 22–32)
CREATININE: 0.75 mg/dL (ref 0.44–1.00)
Chloride: 107 mmol/L (ref 101–111)
GFR calc Af Amer: >60 mL/min (ref >60)
Glucose, Bld: 93 mg/dL (ref 65–99)
Potassium: 4.1 mmol/L (ref 3.5–5.1)
SODIUM: 141 mmol/L (ref 135–145)

## 2017-12-06 MED ORDER — ENSURE PRE-SURGERY PO LIQD
592.0000 mL | Freq: Once | ORAL | Status: DC
  Filled 2017-12-06: qty 592

## 2017-12-10 ENCOUNTER — Encounter (HOSPITAL_COMMUNITY): Payer: Self-pay | Admitting: Surgery

## 2017-12-10 ENCOUNTER — Ambulatory Visit (HOSPITAL_COMMUNITY): Payer: Medicare Other | Admitting: Critical Care Medicine

## 2017-12-10 ENCOUNTER — Encounter (HOSPITAL_COMMUNITY)
Admission: RE | Disposition: A | Discharge status: Home / Self Care | Payer: Self-pay | Source: Ambulatory Visit | Attending: General Surgery

## 2017-12-10 ENCOUNTER — Other Ambulatory Visit: Payer: Self-pay

## 2017-12-10 ENCOUNTER — Ambulatory Visit (HOSPITAL_COMMUNITY)
Admission: RE | Admit: 2017-12-10 | Discharge: 2017-12-10 | Disposition: A | Discharge status: Home / Self Care | Payer: Medicare Other | Source: Ambulatory Visit | Attending: General Surgery | Admitting: General Surgery

## 2017-12-10 DIAGNOSIS — Z8719 Personal history of other diseases of the digestive system: Secondary | ICD-10-CM | POA: Diagnosis not present

## 2017-12-10 DIAGNOSIS — Z79899 Other long term (current) drug therapy: Secondary | ICD-10-CM | POA: Diagnosis not present

## 2017-12-10 DIAGNOSIS — Z853 Personal history of malignant neoplasm of breast: Secondary | ICD-10-CM | POA: Insufficient documentation

## 2017-12-10 DIAGNOSIS — F419 Anxiety disorder, unspecified: Secondary | ICD-10-CM | POA: Insufficient documentation

## 2017-12-10 DIAGNOSIS — K219 Gastro-esophageal reflux disease without esophagitis: Secondary | ICD-10-CM | POA: Insufficient documentation

## 2017-12-10 DIAGNOSIS — Z87891 Personal history of nicotine dependence: Secondary | ICD-10-CM | POA: Diagnosis not present

## 2017-12-10 DIAGNOSIS — K8050 Calculus of bile duct without cholangitis or cholecystitis without obstruction: Secondary | ICD-10-CM | POA: Diagnosis not present

## 2017-12-10 DIAGNOSIS — K8010 Calculus of gallbladder with chronic cholecystitis without obstruction: Secondary | ICD-10-CM | POA: Insufficient documentation

## 2017-12-10 DIAGNOSIS — M797 Fibromyalgia: Secondary | ICD-10-CM | POA: Diagnosis not present

## 2017-12-10 DIAGNOSIS — K824 Cholesterolosis of gallbladder: Secondary | ICD-10-CM | POA: Diagnosis not present

## 2017-12-10 DIAGNOSIS — E039 Hypothyroidism, unspecified: Secondary | ICD-10-CM | POA: Insufficient documentation

## 2017-12-10 DIAGNOSIS — I10 Essential (primary) hypertension: Secondary | ICD-10-CM | POA: Diagnosis not present

## 2017-12-10 HISTORY — PX: CHOLECYSTECTOMY: SHX55

## 2017-12-10 LAB — COMPREHENSIVE METABOLIC PANEL
ALK PHOS: 49 U/L (ref 38–126)
ALT: 25 U/L (ref 14–54)
ANION GAP: 8 (ref 5–15)
AST: 26 U/L (ref 15–41)
Albumin: 3.4 g/dL — ABNORMAL LOW (ref 3.5–5.0)
BILIRUBIN TOTAL: 0.4 mg/dL (ref 0.3–1.2)
BUN: 7 mg/dL (ref 6–20)
CO2: 26 mmol/L (ref 22–32)
CREATININE: 0.69 mg/dL (ref 0.44–1.00)
Calcium: 8.6 mg/dL — ABNORMAL LOW (ref 8.9–10.3)
Chloride: 107 mmol/L (ref 101–111)
GFR calc non Af Amer: >60 mL/min (ref >60)
Glucose, Bld: 172 mg/dL — ABNORMAL HIGH (ref 65–99)
Potassium: 3.8 mmol/L (ref 3.5–5.1)
Sodium: 141 mmol/L (ref 135–145)
TOTAL PROTEIN: 5.8 g/dL — AB (ref 6.5–8.1)

## 2017-12-10 SURGERY — LAPAROSCOPIC CHOLECYSTECTOMY
Anesthesia: General

## 2017-12-10 MED ORDER — MIDAZOLAM HCL 2 MG/2ML IJ SOLN
INTRAMUSCULAR | Status: AC
  Filled 2017-12-10: qty 2

## 2017-12-10 MED ORDER — OXYCODONE HCL 5 MG PO TABS: 5.0000 mg | ORAL_TABLET | ORAL | Status: DC | PRN

## 2017-12-10 MED ORDER — LIDOCAINE 2% (20 MG/ML) 5 ML SYRINGE
INTRAMUSCULAR | Status: DC | PRN
  Administered 2017-12-10: 100 mg via INTRAVENOUS

## 2017-12-10 MED ORDER — ACETAMINOPHEN 500 MG PO TABS
ORAL_TABLET | ORAL | Status: AC
  Administered 2017-12-10: 1000 mg via ORAL
  Filled 2017-12-10: qty 2

## 2017-12-10 MED ORDER — CELECOXIB 200 MG PO CAPS
400.0000 mg | ORAL_CAPSULE | ORAL | Status: AC
  Administered 2017-12-10: 400 mg via ORAL

## 2017-12-10 MED ORDER — SODIUM CHLORIDE 0.9 % IR SOLN
Status: DC | PRN
  Administered 2017-12-10: 1

## 2017-12-10 MED ORDER — HYDROMORPHONE HCL 1 MG/ML IJ SOLN
0.2500 mg | INTRAMUSCULAR | Status: DC | PRN
  Administered 2017-12-10 (×2): 0.5 mg via INTRAVENOUS

## 2017-12-10 MED ORDER — OXYCODONE HCL 5 MG PO TABS: 5.0000 mg | ORAL_TABLET | Freq: Four times a day (QID) | ORAL | 0 refills | Status: DC | PRN

## 2017-12-10 MED ORDER — CEFAZOLIN SODIUM-DEXTROSE 2-4 GM/100ML-% IV SOLN
INTRAVENOUS | Status: AC
  Filled 2017-12-10: qty 100

## 2017-12-10 MED ORDER — FENTANYL CITRATE (PF) 250 MCG/5ML IJ SOLN
INTRAMUSCULAR | Status: DC | PRN
  Administered 2017-12-10: 50 ug via INTRAVENOUS
  Administered 2017-12-10: 100 ug via INTRAVENOUS

## 2017-12-10 MED ORDER — OXYCODONE HCL 5 MG PO TABS: 5.0000 mg | ORAL_TABLET | Freq: Once | ORAL | Status: DC | PRN

## 2017-12-10 MED ORDER — GABAPENTIN 300 MG PO CAPS
300.0000 mg | ORAL_CAPSULE | ORAL | Status: AC
  Administered 2017-12-10: 300 mg via ORAL

## 2017-12-10 MED ORDER — PROPOFOL 10 MG/ML IV BOLUS
INTRAVENOUS | Status: AC
  Filled 2017-12-10: qty 20

## 2017-12-10 MED ORDER — FENTANYL CITRATE (PF) 250 MCG/5ML IJ SOLN
INTRAMUSCULAR | Status: AC
  Filled 2017-12-10: qty 5

## 2017-12-10 MED ORDER — ONDANSETRON HCL 4 MG/2ML IJ SOLN
INTRAMUSCULAR | Status: DC | PRN
  Administered 2017-12-10: 4 mg via INTRAVENOUS

## 2017-12-10 MED ORDER — SCOPOLAMINE 1 MG/3DAYS TD PT72
MEDICATED_PATCH | TRANSDERMAL | Status: DC | PRN
  Administered 2017-12-10: 1 via TRANSDERMAL

## 2017-12-10 MED ORDER — LACTATED RINGERS IV SOLN
INTRAVENOUS | Status: DC
  Administered 2017-12-10: 08:00:00 via INTRAVENOUS

## 2017-12-10 MED ORDER — CEFAZOLIN SODIUM-DEXTROSE 2-4 GM/100ML-% IV SOLN
2.0000 g | INTRAVENOUS | Status: AC
  Administered 2017-12-10: 2 g via INTRAVENOUS

## 2017-12-10 MED ORDER — PROPOFOL 10 MG/ML IV BOLUS
INTRAVENOUS | Status: DC | PRN
  Administered 2017-12-10: 130 mg via INTRAVENOUS

## 2017-12-10 MED ORDER — BUPIVACAINE-EPINEPHRINE 0.25% -1:200000 IJ SOLN
INTRAMUSCULAR | Status: DC | PRN
  Administered 2017-12-10: 13 mL

## 2017-12-10 MED ORDER — 0.9 % SODIUM CHLORIDE (POUR BTL) OPTIME
TOPICAL | Status: DC | PRN
  Administered 2017-12-10: 1000 mL

## 2017-12-10 MED ORDER — CELECOXIB 200 MG PO CAPS
ORAL_CAPSULE | ORAL | Status: AC
  Administered 2017-12-10: 400 mg via ORAL
  Filled 2017-12-10: qty 2

## 2017-12-10 MED ORDER — PHENYLEPHRINE 40 MCG/ML (10ML) SYRINGE FOR IV PUSH (FOR BLOOD PRESSURE SUPPORT)
PREFILLED_SYRINGE | INTRAVENOUS | Status: DC | PRN
  Administered 2017-12-10: 40 ug via INTRAVENOUS
  Administered 2017-12-10 (×2): 80 ug via INTRAVENOUS
  Administered 2017-12-10: 40 ug via INTRAVENOUS

## 2017-12-10 MED ORDER — SUGAMMADEX SODIUM 200 MG/2ML IV SOLN
INTRAVENOUS | Status: DC | PRN
  Administered 2017-12-10: 300 mg via INTRAVENOUS

## 2017-12-10 MED ORDER — ROCURONIUM BROMIDE 10 MG/ML (PF) SYRINGE
PREFILLED_SYRINGE | INTRAVENOUS | Status: DC | PRN
  Administered 2017-12-10: 50 mg via INTRAVENOUS

## 2017-12-10 MED ORDER — OXYCODONE HCL 5 MG/5ML PO SOLN: 5.0000 mg | Freq: Once | ORAL | Status: DC | PRN

## 2017-12-10 MED ORDER — GABAPENTIN 300 MG PO CAPS
ORAL_CAPSULE | ORAL | Status: AC
  Administered 2017-12-10: 300 mg via ORAL
  Filled 2017-12-10: qty 1

## 2017-12-10 MED ORDER — ACETAMINOPHEN 500 MG PO TABS
1000.0000 mg | ORAL_TABLET | ORAL | Status: AC
  Administered 2017-12-10: 1000 mg via ORAL

## 2017-12-10 MED ORDER — MIDAZOLAM HCL 5 MG/5ML IJ SOLN
INTRAMUSCULAR | Status: DC | PRN
  Administered 2017-12-10: 2 mg via INTRAVENOUS

## 2017-12-10 MED ORDER — HYDROMORPHONE HCL 1 MG/ML IJ SOLN
INTRAMUSCULAR | Status: AC
  Administered 2017-12-10: 0.5 mg via INTRAVENOUS
  Filled 2017-12-10: qty 1

## 2017-12-10 MED ORDER — DEXAMETHASONE SODIUM PHOSPHATE 10 MG/ML IJ SOLN
INTRAMUSCULAR | Status: DC | PRN
  Administered 2017-12-10: 4 mg via INTRAVENOUS

## 2017-12-10 MED ORDER — PROMETHAZINE HCL 25 MG/ML IJ SOLN: 6.2500 mg | INTRAMUSCULAR | Status: DC | PRN

## 2017-12-10 SURGICAL SUPPLY — 36 items
APPLIER CLIP 5 13 M/L LIGAMAX5 (MISCELLANEOUS) ×2
BLADE CLIPPER SURG (BLADE) IMPLANT
CANISTER SUCT 3000ML PPV (MISCELLANEOUS) ×2 IMPLANT
CHLORAPREP W/TINT 26ML (MISCELLANEOUS) ×2 IMPLANT
CLIP APPLIE 5 13 M/L LIGAMAX5 (MISCELLANEOUS) ×1 IMPLANT
COVER SURGICAL LIGHT HANDLE (MISCELLANEOUS) ×2 IMPLANT
DERMABOND ADVANCED (GAUZE/BANDAGES/DRESSINGS) ×1
DERMABOND ADVANCED .7 DNX12 (GAUZE/BANDAGES/DRESSINGS) ×1 IMPLANT
DEVICE TROCAR PUNCTURE CLOSURE (ENDOMECHANICALS) ×2 IMPLANT
ELECT REM PT RETURN 9FT ADLT (ELECTROSURGICAL) ×2
ELECTRODE REM PT RTRN 9FT ADLT (ELECTROSURGICAL) ×1 IMPLANT
GLOVE BIO SURGEON STRL SZ7 (GLOVE) ×2 IMPLANT
GLOVE BIOGEL PI IND STRL 7.5 (GLOVE) ×1 IMPLANT
GLOVE BIOGEL PI INDICATOR 7.5 (GLOVE) ×1
GOWN STRL REUS W/ TWL LRG LVL3 (GOWN DISPOSABLE) ×3 IMPLANT
GOWN STRL REUS W/TWL LRG LVL3 (GOWN DISPOSABLE) ×3
KIT BASIN OR (CUSTOM PROCEDURE TRAY) ×2 IMPLANT
KIT ROOM TURNOVER OR (KITS) ×2 IMPLANT
NS IRRIG 1000ML POUR BTL (IV SOLUTION) ×2 IMPLANT
PAD ARMBOARD 7.5X6 YLW CONV (MISCELLANEOUS) ×2 IMPLANT
POUCH RETRIEVAL ECOSAC 10 (ENDOMECHANICALS) ×1 IMPLANT
POUCH RETRIEVAL ECOSAC 10MM (ENDOMECHANICALS) ×1
SCISSORS LAP 5X35 DISP (ENDOMECHANICALS) ×2 IMPLANT
SET IRRIG TUBING LAPAROSCOPIC (IRRIGATION / IRRIGATOR) ×2 IMPLANT
SLEEVE ENDOPATH XCEL 5M (ENDOMECHANICALS) ×4 IMPLANT
SPECIMEN JAR SMALL (MISCELLANEOUS) ×2 IMPLANT
STRIP CLOSURE SKIN 1/2X4 (GAUZE/BANDAGES/DRESSINGS) ×2 IMPLANT
SUT MNCRL AB 4-0 PS2 18 (SUTURE) ×2 IMPLANT
SUT VICRYL 0 UR6 27IN ABS (SUTURE) ×2 IMPLANT
TOWEL OR 17X24 6PK STRL BLUE (TOWEL DISPOSABLE) ×2 IMPLANT
TOWEL OR 17X26 10 PK STRL BLUE (TOWEL DISPOSABLE) ×2 IMPLANT
TRAY LAPAROSCOPIC MC (CUSTOM PROCEDURE TRAY) ×2 IMPLANT
TROCAR XCEL BLUNT TIP 100MML (ENDOMECHANICALS) ×2 IMPLANT
TROCAR XCEL NON-BLD 5MMX100MML (ENDOMECHANICALS) ×2 IMPLANT
TUBING INSUFFLATION (TUBING) ×2 IMPLANT
WATER STERILE IRR 1000ML POUR (IV SOLUTION) ×2 IMPLANT

## 2017-12-10 NOTE — Anesthesia Postprocedure Evaluation (Signed)
Anesthesia Post Note  Patient: Desiree Anderson  Procedure(s) Performed: LAPAROSCOPIC CHOLECYSTECTOMY (N/A )     Patient location during evaluation: PACU Anesthesia Type: General Level of consciousness: awake and alert Pain management: pain level controlled Vital Signs Assessment: post-procedure vital signs reviewed and stable Respiratory status: spontaneous breathing, nonlabored ventilation, respiratory function stable and patient connected to nasal cannula oxygen Cardiovascular status: blood pressure returned to baseline and stable Postop Assessment: no apparent nausea or vomiting Anesthetic complications: no    Last Vitals:  Vitals:   12/10/17 1052 12/10/17 1105  BP: 123/76 133/72  Pulse: 67 65  Resp: (!) 23 20  Temp: 36.7 C   SpO2: (!) 76% 94%    Last Pain:  Vitals:   12/10/17 1105  TempSrc:   PainSc: 0-No pain                 [name] DAVID

## 2017-12-10 NOTE — H&P (Signed)
Desiree Anderson is an 62 y.o. female.   Chief Complaint: gallbladder polyp HPI: 28 yof I know from taking care of her right breast stage IA idc that is er/per positive. she was treated with lump/sn/radiotherapy and tamoxifen. she is doing well from that. she is here today because she has had years of ibs symptoms per her report. recently she had worsening diarrhea and lower abdominal pain. she was treated with abx and this improved. abdominal pain gone lower abdomen and back to regular symptoms. she does report some occasional ruq pain that is worse with fatty food. no prior colonoscopy. does have history of cystocele/rectocele repair. she has been seen by Dr Loletha Carrow. was found on routine ruq [place] to have a 1 cm gallbladder polyp, no stones, no gbw thickening. transaminases mildly elevated in october but normal in november. referred for consideration of cholecystectomy   Past Medical History:  Diagnosis Date  . Anxiety   . Breast cancer ([place])   . Complication of anesthesia    hypotension  . DDD (degenerative disc disease), lumbar   . Erosion of vaginal mesh ([place])   . Fibromyalgia   . GERD (gastroesophageal reflux disease)   . History of gastric ulcer   . History of radiation therapy 07/06/16- 08/22/16   Right Breast  . Hypertension   . Hypothyroidism   . Personal history of radiation therapy   . Rectocele     Past Surgical History:  Procedure Laterality Date  . BLADDER TACK  2003  . BREAST BIOPSY Right 04/18/2016  . BREAST LUMPECTOMY  1985  . BREAST LUMPECTOMY Right 05/18/2016  . CARDIOVASCULAR STRESS TEST  07-02-2013  DR DALTON MCLEAN   LOW RISK NUCLEAR STUDY WITH A SMALL, MILD APICAL SEPTAL FIXED PERFUSION DEFECT .  GIVEN NORMAL WALL FUNCTION , SUSPECT REPRESENTS ATTENUATION/  EF 74%/  NO ISCHEMIA  . CATARACT EXTRACTION W/ INTRAOCULAR LENS IMPLANT Left 01/2014  . CYSTO/  HYDRODISTENTION/  BLADDER BX/   INSTILLATION THERAPY  08-29-2010  . EXCISION OF MESH N/A 03/23/2014   Procedure:  EXCISION OF VAGINAL AND PERI-URETHRAL MESH, EXTRACTION FROM URETHRA TO APEX, insertion of XENFORM in the vaginal vault;  Surgeon: Ailene Rud, MD;  Location[facility];  Service: Urology;  Laterality: N/A;  . EXCISION VAGINAL CYST N/A 04/06/2014   Procedure: VAGINAL SPECULUM EXAMINATION/POSSIBLE DRAINAGE OF HEMATOMA;  Surgeon: Ailene Rud, MD;  Location: [facility];  Service: Urology;  Laterality: N/A;  . EYE SURGERY    . INCONTINENCE SURGERY  2009  &  2005  . RADIOACTIVE SEED GUIDED PARTIAL MASTECTOMY WITH AXILLARY SENTINEL LYMPH NODE BIOPSY Right 05/18/2016   Procedure: RADIOACTIVE SEED GUIDED RIGHT BREAST LUMPECTOMY WITH AXILLARY SENTINEL LYMPH NODE BIOPSY;  Surgeon: Rolm Bookbinder, MD;  Location: [facility];  Service: General;  Laterality: Right;  RADIOACTIVE SEED GUIDED RIGHT BREAST LUMPECTOMY WITH AXILLARY SENTINEL LYMPH NODE BIOPSY  . TRANSTHORACIC ECHOCARDIOGRAM  06-16-2013   GRADE I DIASTOLIC DYSFUNCTION/  EF 55-60%/  MILD AR/  TRIVIAL MR  &  TR  . VAGINAL HYSTERECTOMY  2000   w/  unilateral salpingoophorectomy  . VEIN BYPASS SURGERY  AGE 45   RIGHT LEG    Family History  Problem Relation Age of Onset  . Diabetes Mother   . Diverticulitis Mother   . Lung cancer Brother   . Intestinal polyp Sister    Social History:  reports that she quit smoking about 22 years ago. Her smoking use included cigarettes. She  quit after 20.00 years of use. Her smokeless tobacco use includes snuff. She reports that she does not drink alcohol or use drugs.  Allergies:  Allergies  Allergen Reactions  . Iodinated Diagnostic Agents Other (See Comments)    Unknown   . Lipitor [Atorvastatin] Other (See Comments)    Muscle and legs aches  . Shellfish Allergy Diarrhea and Nausea And Vomiting  . Betadine [Povidone Iodine] Rash    No medications prior to admission.    No results found for this or any previous visit (from the past 48  hour(s)). No results found.  Review of Systems  All other systems reviewed and are negative.   There were no vitals taken for this visit. Physical Exam  Vitals Alean Rinne RMA; 11/16/2017 8:59 AM) 11/16/2017 8:58 AM Weight: 172.4 lb Height: 63in Body Surface Area: 1.82 m Body Mass Index: 30.54 kg/m  Temp.: 98.73F  Pulse: 99 (Regular)  BP: 125/72 (Sitting, Left Arm, Standard) Physical Exam Rolm Bookbinder MD; 11/16/2017 9:21 AM) General Mental Status-Alert. Head and Neck Trachea-midline. Thyroid Gland Characteristics - normal size and consistency. Eye Sclera/Conjunctiva - Bilateral-No scleral icterus. Chest and Lung Exam Chest and lung exam reveals -quiet, even and easy respiratory effort with no use of accessory muscles and on auscultation, normal breath sounds, no adventitious sounds and normal vocal resonance. Cardiovascular Cardiovascular examination reveals -normal heart sounds, regular rate and rhythm with no murmurs. Abdomen Note: soft mildly tender ruq bs present no murphys sign Neurologic Neurologic evaluation reveals -alert and oriented x 3 with no impairment of recent or remote memory.  Assessment/Plan GALLBLADDER POLYP (K82.4) Story: I recommended lap chole for 1 cm polyp and it does sound like some of her symptoms might be referable to gallbladder. I did tell her the lap chole would not resolve all of these symptoms and might make diarrhea worse at least for the short term. I discussed the procedure in detail. We discussed the risks and benefits of a laparoscopic cholecystectomy and possible cholangiogram including, but not limited to bleeding, infection, injury to surrounding structures such as the intestine or liver, bile leak, retained gallstones, need to convert to an open procedure, prolonged diarrhea, blood clots such as DVT, common bile duct injury, anesthesia risks, and possible need for additional procedures. The likelihood of  improvement in symptoms and return to the patient's normal status is good. We discussed the typical post-operative recovery course.   Rolm Bookbinder, MD 12/10/2017, 7:08 AM

## 2017-12-10 NOTE — Anesthesia Procedure Notes (Signed)
Procedure Name: Intubation Date/Time: 12/10/2017 9:09 AM Performed by: Wilburn Cornelia, CRNA Pre-anesthesia Checklist: Patient identified, Emergency Drugs available, Suction available, Patient being monitored and Timeout performed Patient Re-evaluated:Patient Re-evaluated prior to induction Oxygen Delivery Method: Circle system utilized Preoxygenation: Pre-oxygenation with 100% oxygen Induction Type: IV induction Ventilation: Mask ventilation without difficulty Laryngoscope Size: Mac and 3 Grade View: Grade I Tube type: Oral Tube size: 7.0 mm Number of attempts: 1 Airway Equipment and Method: Stylet Placement Confirmation: ETT inserted through vocal cords under direct vision,  positive ETCO2,  CO2 detector and breath sounds checked- equal and bilateral Secured at: 20 cm Tube secured with: Tape Dental Injury: Teeth and Oropharynx as per pre-operative assessment

## 2017-12-10 NOTE — Anesthesia Preprocedure Evaluation (Addendum)
Anesthesia Evaluation  Patient identified by MRN, date of birth, ID band Patient awake    Reviewed: Allergy & Precautions, H&P , NPO status , Patient's Chart, lab work & pertinent test results  Airway Mallampati: II   Neck ROM: full    Dental  (+) Dental Advisory Given, Teeth Intact, Chipped,    Pulmonary former smoker,    breath sounds clear to auscultation       Cardiovascular hypertension,  Rhythm:regular Rate:Normal  ECG: NSR, rate 70. Possible LAE   Neuro/Psych PSYCHIATRIC DISORDERS Anxiety  Neuromuscular disease    GI/Hepatic GERD  Medicated and Controlled,  Endo/Other  Hypothyroidism   Renal/GU      Musculoskeletal  (+) Arthritis , Fibromyalgia -  Abdominal   Peds  Hematology HLD   Anesthesia Other Findings GALLBLADDER POLYP  Reproductive/Obstetrics                           Anesthesia Physical  Anesthesia Plan  ASA: II  Anesthesia Plan: General   Post-op Pain Management:    Induction: Intravenous  PONV Risk Score and Plan: 4 or greater and Midazolam, Dexamethasone, Ondansetron, Scopolamine patch - Pre-op and Treatment may vary due to age or medical condition  Airway Management Planned: Oral ETT  Additional Equipment:   Intra-op Plan:   Post-operative Plan: Extubation in OR  Informed Consent: I have reviewed the patients History and Physical, chart, labs and discussed the procedure including the risks, benefits and alternatives for the proposed anesthesia with the patient or authorized representative who has indicated his/her understanding and acceptance.   Dental advisory given  Plan Discussed with: CRNA  Anesthesia Plan Comments:         Anesthesia Quick Evaluation

## 2017-12-10 NOTE — Interval H&P Note (Signed)
History and Physical Interval Note:  12/10/2017 8:49 AM  Desiree Anderson  has presented today for surgery, with the diagnosis of GALLBLADDER POLYP  The various methods of treatment have been discussed with the patient and family. After consideration of risks, benefits and other options for treatment, the patient has consented to  Procedure(s): LAPAROSCOPIC CHOLECYSTECTOMY (N/A) as a surgical intervention .  [name] history has been reviewed, patient examined, no change in status, stable for surgery.  I have reviewed the patient's chart and labs.  Questions were answered to the patient's satisfaction.     Rolm Bookbinder

## 2017-12-10 NOTE — Discharge Instructions (Signed)
[facility] LAPAROSCOPIC SURGERY: POST OP INSTRUCTIONS  Always review your discharge instruction sheet given to you by the facility where your surgery was performed. IF YOU HAVE DISABILITY OR FAMILY LEAVE FORMS, YOU MUST BRING THEM TO THE OFFICE FOR PROCESSING.   DO NOT GIVE THEM TO YOUR DOCTOR.  1. A prescription for pain medication may be given to you upon discharge.  Take your pain medication as prescribed, if needed.  If narcotic pain medicine is not needed, then you may take acetaminophen (Tylenol), naprosyn (Alleve), or ibuprofen (Advil) as needed. 2. Take your usually prescribed medications unless otherwise directed. 3. If you need a refill on your pain medication, please contact your pharmacy.  They will contact our office to request authorization. Prescriptions will not be filled after 5pm or on week-ends. 4. You should follow a light diet the first few days after arrival home, such as soup and crackers, etc.  Be sure to include lots of fluids daily. 5. Most patients will experience some swelling and bruising in the area of the incisions.  Ice packs will help.  Swelling and bruising can take several days to resolve.  6. It is common to experience some constipation if taking pain medication after surgery.  Increasing fluid intake and taking a stool softener (such as Colace) will usually help or prevent this problem from occurring.  A mild laxative (Milk of Magnesia or Miralax) should be taken according to package instructions if there are no bowel movements after 48 hours. 7. Unless discharge instructions indicate otherwise, you may remove your bandages 48 hours after surgery, and you may shower at that time.  You may have steri-strips (small skin tapes) in place directly over the incision.  These strips should be left on the skin for 7-10 days.  If your surgeon used skin glue on the incision, you may shower in 24 hours.  The glue will flake  off over the next 2-3 weeks.  Any sutures or staples will be removed at the office during your follow-up visit. 8. ACTIVITIES:  You may resume regular (light) daily activities beginning the next day--such as daily self-care, walking, climbing stairs--gradually increasing activities as tolerated.  You may have sexual intercourse when it is comfortable.  Refrain from any heavy lifting or straining until approved by your doctor. a. You may drive when you are no longer taking prescription pain medication, you can comfortably wear a seatbelt, and you can safely maneuver your car and apply brakes. b. RETURN TO WORK:  __________________________________________________________ 9. You should see your doctor in the office for a follow-up appointment approximately 2-3 weeks after your surgery.  Make sure that you call for this appointment within a day or two after you arrive home to insure a convenient appointment time. 10. OTHER INSTRUCTIONS: __________________________________________________________________________________________________________________________ __________________________________________________________________________________________________________________________ WHEN TO CALL YOUR DOCTOR: 1. Fever over 101.0 2. Inability to urinate 3. Continued bleeding from incision. 4. Increased pain, redness, or drainage from the incision. 5. Increasing abdominal pain  The clinic staff is available to answer your questions during regular business hours.  Please don't hesitate to call and ask to speak to one of the nurses for clinical concerns.  If you have a medical emergency, go to the nearest emergency room or call 911.  A surgeon from [facility] is always on call at the hospital. [address], [address], [place], [place]  [address] ? [address] [address], [place], [place]   [address] [phone] ? [phone] ? [phone]   [phone] Web site: [email or web address]  

## 2017-12-10 NOTE — Transfer of Care (Signed)
Immediate Anesthesia Transfer of Care Note  Patient: Desiree Anderson  Procedure(s) Performed: LAPAROSCOPIC CHOLECYSTECTOMY (N/A )  Patient Location: PACU  Anesthesia Type:General  Level of Consciousness: awake, alert  and oriented  Airway & Oxygen Therapy: Patient Spontanous Breathing and Patient connected to nasal cannula oxygen  Post-op Assessment: Report given to RN, Post -op Vital signs reviewed and stable and Patient moving all extremities X 4  Post vital signs: Reviewed and stable  Last Vitals:  Vitals:   12/10/17 0752  BP: 133/63  Pulse: 90  Resp: 20  Temp: 36.6 C  SpO2: 98%   HR 69, BP 104/65, sats 93%, RR 13 Last Pain:  Vitals:   12/10/17 0752  TempSrc: Oral      Patients Stated Pain Goal: 3 (60/60/04 5997)  Complications: No apparent anesthesia complications

## 2017-12-10 NOTE — Op Note (Signed)
Preoperative diagnosis: biliary colic, gallbladder polyp Postoperative diagnosis:same as above Procedure: Laparoscopic cholecystectomy Surgeon: Dr. Serita Grammes Asst:Sharon Hitchcock Anesthesia: Gen. Specimens: gb to pathology Estimated blood loss: minimal Complications: None Drains: none Sponge count was correct at completion Disposition to recovery stable  Indications: This is a62 yof I know from breast cancer treatment. She has ruq symptoms and an [place] that shows a 1 cm gallbladder polyp.  I discussed a laparoscopic cholecystectomy with her.   Procedure: After informed consent was obtained the patient was taken to the operating room. She was givenantibiotics. SCDs were in place. She was placed undergeneral anesthesia without complication. Her abdomen was prepped and draped in the standard sterile surgical fashion. A surgical timeout was then performed.  I infiltrated marcainebelow the umbilicus.Imade a vertical incision below the umbilicus.  I then incised the fascia and entered the peritoneum bluntly.  I placed a 0 vicryl pursestring suture and inserted a hasson trocar. The abdomen was insufflated to 15 mm Hg pressure.   I then placed a 5 mm trocar in theepigastrium.Two5 mm trocars were placed in the right side of the abdomen. Igrasped the gallbladder and retracted cephalad and lateral.EventuallyI was able to identify the critical view of safety.I then clipped the duct and divided it. The duct was viable. The clips traversed the duct. I then clipped and divided the cystic artery. I then removed the gallbladder from the liver bed. there was some spillage of bile.I placed the gallbladderin a bag and removed from the umbilicus. I obtained hemostasis.I then removed the hasson trocar. I tied the pursestring down. I then placed  0 vicryl sutures using the endoclose device to obliterate this defect. I then removed the remaining trocars and these were closed with 4-0  Monocryl and glue. She tolerated this well be transferred to the recovery room.

## 2017-12-11 ENCOUNTER — Encounter (HOSPITAL_COMMUNITY): Payer: Self-pay | Admitting: General Surgery

## 2017-12-17 ENCOUNTER — Encounter: Payer: Self-pay | Admitting: Internal Medicine

## 2017-12-17 ENCOUNTER — Ambulatory Visit: Payer: Medicare Other | Admitting: Internal Medicine

## 2017-12-17 VITALS — BP 128/84 | HR 88 | Temp 97.3°F | Resp 16 | Ht 62.5 in | Wt 165.0 lb

## 2017-12-17 DIAGNOSIS — E039 Hypothyroidism, unspecified: Secondary | ICD-10-CM

## 2017-12-17 DIAGNOSIS — Z1212 Encounter for screening for malignant neoplasm of rectum: Secondary | ICD-10-CM

## 2017-12-17 DIAGNOSIS — Z1211 Encounter for screening for malignant neoplasm of colon: Secondary | ICD-10-CM

## 2017-12-17 DIAGNOSIS — E782 Mixed hyperlipidemia: Secondary | ICD-10-CM | POA: Diagnosis not present

## 2017-12-17 DIAGNOSIS — K219 Gastro-esophageal reflux disease without esophagitis: Secondary | ICD-10-CM

## 2017-12-17 DIAGNOSIS — Z79899 Other long term (current) drug therapy: Secondary | ICD-10-CM

## 2017-12-17 DIAGNOSIS — R7309 Other abnormal glucose: Secondary | ICD-10-CM

## 2017-12-17 DIAGNOSIS — Z136 Encounter for screening for cardiovascular disorders: Secondary | ICD-10-CM

## 2017-12-17 DIAGNOSIS — I10 Essential (primary) hypertension: Secondary | ICD-10-CM | POA: Diagnosis not present

## 2017-12-17 DIAGNOSIS — E559 Vitamin D deficiency, unspecified: Secondary | ICD-10-CM

## 2017-12-17 DIAGNOSIS — Z853 Personal history of malignant neoplasm of breast: Secondary | ICD-10-CM

## 2017-12-17 DIAGNOSIS — R7303 Prediabetes: Secondary | ICD-10-CM | POA: Diagnosis not present

## 2017-12-17 DIAGNOSIS — Z0001 Encounter for general adult medical examination with abnormal findings: Secondary | ICD-10-CM

## 2017-12-17 DIAGNOSIS — M797 Fibromyalgia: Secondary | ICD-10-CM

## 2017-12-17 DIAGNOSIS — Z0000 Encounter for general adult medical examination without abnormal findings: Secondary | ICD-10-CM | POA: Diagnosis not present

## 2017-12-17 MED ORDER — GABAPENTIN 600 MG PO TABS
ORAL_TABLET | ORAL | 1 refills | Status: DC
Start: 2017-12-17 — End: 2020-02-15

## 2017-12-17 NOTE — Progress Notes (Signed)
[facility] Unk Pinto, M.D.     Uvaldo Bristle. Silverio Lay, P.A.-C Liane Comber, [place] [address] [place], [place] [address] Telephone [phone] Telefax [phone] Annual Screening/Preventative Visit & Comprehensive Evaluation &  Examination     This very nice 62 y.o. MWFpresents for a Screening/Preventative Visit & comprehensive evaluation and management of multiple medical co-morbidities.  Patient has been followed for HTN, T2_NIDDM  Prediabetes, Hyperlipidemia and Vitamin D Deficiency. Patient has been on SS Disability since 2005 for Fibromyalgia, chronic Pain Syndrome  From DDD/DJD and Interstitial Cystitis. Patient relates that she has been able to taper off of her opioids and has found relief occasionally taking some of her husband's Gabapentin and requests her own Rx. She also has GERD controlled with diet and her meds.       HTN predates since 2004. Patient's BP has been controlled at home and patient denies any cardiac symptoms as chest pain, palpitations, shortness of breath, dizziness or ankle swelling. Today's BP is at goal - 128/84      Patient's hyperlipidemia is controlled with diet and medications. Patient denies myalgias or other medication SE's. Last lipids were at goal: Lab Results  Component Value Date   CHOL 138 09/04/2017   HDL 50 (L) 09/04/2017   LDLCALC 59 05/24/2017   TRIG 89 09/04/2017   CHOLHDL 2.8 09/04/2017      Patient has prediabetes (A1c 6.1%/2011) and patient denies reactive hypoglycemic symptoms, visual blurring, diabetic polys, or paresthesias. Last A1c was Normal & at goal:  Lab Results  Component Value Date   HGBA1C 5.5 09/04/2017       Patient was dx'd Hypothyroid in 2002 and has been on thyroid replacement since. Finally, patient has history of Vitamin D Deficiency ("11"/2008) and last Vitamin D was at goal: Lab Results  Component Value Date   VD25OH 77  05/24/2017   Current Outpatient Medications on File Prior to Visit  Medication Sig  . ALPRAZolam (XANAX) 0.5 MG tablet Take 1/2 to 1 tablet 2 to 3 x / day ONLY if needed for Anxiety Attacks and please try to limit to 5 days /week to avoid addiction (Patient taking differently: Take 0.25 mg by mouth daily. for Anxiety Attacks and please try to limit to 5 days /week to avoid addiction)  . cetirizine (ZYRTEC) 10 MG tablet Take 10 mg by mouth daily.   . Cholecalciferol (VITAMIN D-3) 5000 UNITS TABS Take 5,000 Units by mouth 2 (two) times daily.   . fluticasone (FLONASE) 50 MCG/ACT nasal spray 2 SPRAY EACH NOSTRIL TWICE DAILY AS NEEDED FOR ALLERGIES  . hyoscyamine (LEVSIN SL) 0.125 MG SL tablet Place 1 tablet (0.125 mg total) under the tongue every 6 (six) hours as needed. (Patient taking differently: Place 0.125 mg under the tongue 2 (two) times daily. At lunch and before supper)  . KLOR-CON M20 20 MEQ tablet TAKE 1 TABLET BY MOUTH TWICE DAILY  . levothyroxine (SYNTHROID, LEVOTHROID) 75 MCG tablet TAKE 1 TABLET BY MOUTH ONCE DAILY BEFORE BREAKFAST (Patient taking differently: TAKE 1 TABLET BY MOUTH ONCE DAILY BEFORE BREAKFAST MWFSS, .5 tab Tues and Thur)  . Multiple Vitamins-Minerals (GERITABS PO) Take 1 tablet by mouth daily. Reported on 06/08/2016  . ranitidine (ZANTAC) 300 MG tablet TAKE ONE TABLET BY MOUTH TWICE DAILY  . rosuvastatin (CRESTOR) 40 MG tablet TAKE ONE TABLET BY MOUTH ONCE DAILY (Patient taking differently: TAKE ONE HALF TABLET BY MOUTH ONCE IN EVENING)  . tamoxifen (NOLVADEX)  20 MG tablet Take 1 tablet (20 mg total) by mouth daily.  . triazolam (HALCION) 0.25 MG tablet TAKE 1 TABLET BY MOUTH AT BEDTIME (Patient taking differently: TAKE 1 HALF TABLET BY MOUTH AT BEDTIME AND ANOTHER HALF IF NEEDED FOR SLEEP)   No current facility-administered medications on file prior to visit.    Allergies  Allergen Reactions  . Iodinated Diagnostic Agents Other (See Comments)    Unknown   .  Lipitor [Atorvastatin] Other (See Comments)    Muscle and legs aches  . Shellfish Allergy Diarrhea and Nausea And Vomiting  . Betadine [Povidone Iodine] Rash   Past Medical History:  Diagnosis Date  . Anxiety   . Breast cancer ([place])   . Complication of anesthesia    hypotension  . DDD (degenerative disc disease), lumbar   . Erosion of vaginal mesh ([place])   . Fibromyalgia   . GERD (gastroesophageal reflux disease)   . History of gastric ulcer   . History of radiation therapy 07/06/16- 08/22/16   Right Breast  . Hypertension   . Hypothyroidism   . Personal history of radiation therapy   . Rectocele    Health Maintenance  Topic Date Due  . Hepatitis C Screening  11/27/2018 (Originally [birth year])  . HIV Screening  11/27/2018 (Originally 11/30/1970)  . PAP SMEAR  04/11/2019  . MAMMOGRAM  04/26/2019  . Fecal DNA (Cologuard)  03/05/2020  . TETANUS/TDAP  03/15/2021  . INFLUENZA VACCINE  Completed   Immunization History  Administered Date(s) Administered  . Influenza Inj Mdck Quad With Preservative 09/04/2017  . Influenza Split 09/18/2013, 09/25/2014  . Influenza, Seasonal, Injecte, Preservative Fre 10/11/2015  . Influenza,inj,quad, With Preservative 08/28/2016  . Pneumococcal-Unspecified 11/27/2000  . Tdap 03/16/2011   Last Colon -  Last Pap -  Past Surgical History:  Procedure Laterality Date  . BLADDER TACK  2003  . BREAST BIOPSY Right 04/18/2016  . BREAST LUMPECTOMY  1985  . BREAST LUMPECTOMY Right 05/18/2016  . CARDIOVASCULAR STRESS TEST  07-02-2013  DR DALTON MCLEAN   LOW RISK NUCLEAR STUDY WITH A SMALL, MILD APICAL SEPTAL FIXED PERFUSION DEFECT .  GIVEN NORMAL WALL FUNCTION , SUSPECT REPRESENTS ATTENUATION/  EF 74%/  NO ISCHEMIA  . CATARACT EXTRACTION W/ INTRAOCULAR LENS IMPLANT Left 01/2014  . CHOLECYSTECTOMY N/A 12/10/2017   Procedure: LAPAROSCOPIC CHOLECYSTECTOMY;  Surgeon: Rolm Bookbinder, MD;  Location: [facility];  Service: General;  Laterality: N/A;  . CYSTO/   HYDRODISTENTION/  BLADDER BX/   INSTILLATION THERAPY  08-29-2010  . EXCISION OF MESH N/A 03/23/2014   Procedure: EXCISION OF VAGINAL AND PERI-URETHRAL MESH, EXTRACTION FROM URETHRA TO APEX, insertion of XENFORM in the vaginal vault;  Surgeon: Ailene Rud, MD;  Location: [facility];  Service: Urology;  Laterality: N/A;  . EXCISION VAGINAL CYST N/A 04/06/2014   Procedure: VAGINAL SPECULUM EXAMINATION/POSSIBLE DRAINAGE OF HEMATOMA;  Surgeon: Ailene Rud, MD;  Location: [facility];  Service: Urology;  Laterality: N/A;  . EYE SURGERY    . INCONTINENCE SURGERY  2009  &  2005  . RADIOACTIVE SEED GUIDED PARTIAL MASTECTOMY WITH AXILLARY SENTINEL LYMPH NODE BIOPSY Right 05/18/2016   Procedure: RADIOACTIVE SEED GUIDED RIGHT BREAST LUMPECTOMY WITH AXILLARY SENTINEL LYMPH NODE BIOPSY;  Surgeon: Rolm Bookbinder, MD;  Location: [facility];  Service: General;  Laterality: Right;  RADIOACTIVE SEED GUIDED RIGHT BREAST LUMPECTOMY WITH AXILLARY SENTINEL LYMPH NODE BIOPSY  . TRANSTHORACIC ECHOCARDIOGRAM  06-16-2013   GRADE I DIASTOLIC DYSFUNCTION/  EF  55-60%/  MILD AR/  TRIVIAL MR  &  TR  . VAGINAL HYSTERECTOMY  2000   w/  unilateral salpingoophorectomy  . VEIN BYPASS SURGERY  AGE 14   RIGHT LEG   Family History  Problem Relation Age of Onset  . Diabetes Mother   . Diverticulitis Mother   . Lung cancer Brother   . Intestinal polyp Sister    Social History   Tobacco Use  . Smoking status: Former Smoker    Years: 20.00    Types: Cigarettes    Last attempt to quit: 11/28/1995    Years since quitting: 22.0  . Smokeless tobacco: Current User    Types: Snuff  . Tobacco comment: dips daily  Substance Use Topics  . Alcohol use: No  . Drug use: No    ROS Constitutional: Denies fever, chills, weight loss/gain, headaches, insomnia,  night sweats, and change in appetite. Does c/o fatigue. Eyes: Denies redness, blurred vision, diplopia, discharge,  itchy, watery eyes.  ENT: Denies discharge, congestion, post nasal drip, epistaxis, sore throat, earache, hearing loss, dental pain, Tinnitus, Vertigo, Sinus pain, snoring.  Cardio: Denies chest pain, palpitations, irregular heartbeat, syncope, dyspnea, diaphoresis, orthopnea, PND, claudication, edema Respiratory: denies cough, dyspnea, DOE, pleurisy, hoarseness, laryngitis, wheezing.  Gastrointestinal: Denies dysphagia, heartburn, reflux, water brash, pain, cramps, nausea, vomiting, bloating, diarrhea, constipation, hematemesis, melena, hematochezia, jaundice, hemorrhoids Genitourinary: Denies dysuria, frequency, urgency, nocturia, hesitancy, discharge, hematuria, flank pain Breast: Breast lumps, nipple discharge, bleeding.  Musculoskeletal: Denies arthralgia, myalgia, stiffness, Jt. Swelling, pain, limp, and strain/sprain. Denies falls. Skin: Denies puritis, rash, hives, warts, acne, eczema, changing in skin lesion Neuro: No weakness, tremor, incoordination, spasms, paresthesia, pain Psychiatric: Denies confusion, memory loss, sensory loss. Denies Depression. Endocrine: Denies change in weight, skin, hair change, nocturia, and paresthesia, diabetic polys, visual blurring, hyper / hypo glycemic episodes.  Heme/Lymph: No excessive bleeding, bruising, enlarged lymph nodes.  Physical Exam  BP 128/84   Pulse 88   Temp (!) 97.3 F (36.3 C)   Resp 16   Ht 5' 2.5" (1.588 m)   Wt 165 lb (74.8 kg)   BMI 29.70 kg/m   General Appearance: Over nourished, well groomed and in no apparent distress.  Eyes: PERRLA, EOMs, conjunctiva no swelling or erythema, normal fundi and vessels. Sinuses: No frontal/maxillary tenderness ENT/Mouth: EACs patent / TMs  nl. Nares clear without erythema, swelling, mucoid exudates. Oral hygiene is good. No erythema, swelling, or exudate. Tongue normal, non-obstructing. Tonsils not swollen or erythematous. Hearing normal.  Neck: Supple, thyroid normal. No bruits, nodes or  JVD. Respiratory: Respiratory effort normal.  BS equal and clear bilateral without rales, rhonci, wheezing or stridor. Cardio: Heart sounds are normal with regular rate and rhythm and no murmurs, rubs or gallops. Peripheral pulses are normal and equal bilaterally without edema. No aortic or femoral bruits. Chest: symmetric with normal excursions and percussion. Breasts: Symmetric, without lumps, nipple discharge, retractions, or fibrocystic changes.  Abdomen: Flat, soft with bowel sounds active. Nontender, no guarding, rebound, hernias, masses, or organomegaly.  Lymphatics: Non tender without lymphadenopathy.  Genitourinary:  Musculoskeletal: Full ROM all peripheral extremities, joint stability, 5/5 strength, and normal gait. Skin: Warm and dry without rashes, lesions, cyanosis, clubbing or  ecchymosis.  Neuro: Cranial nerves intact, reflexes equal bilaterally. Normal muscle tone, no cerebellar symptoms. Sensation intact.  Pysch: Alert and oriented X 3, normal affect, Insight and Judgment appropriate.   Assessment and Plan  1. Annual Preventative Screening Examination  2. Essential hypertension  - Urinalysis, Routine  w reflex microscopic - Microalbumin / creatinine urine ratio - CBC with Differential/Platelet - BASIC METABOLIC PANEL WITH GFR - Magnesium - TSH  3. Hyperlipidemia, mixed  - Hepatic function panel - Lipid panel - TSH  4. Prediabetes  - Hemoglobin A1c - Insulin, random  5. Vitamin D deficiency  - VITAMIN D 25 Hydroxy  6. Abnormal glucose  - Hemoglobin A1c - Insulin, random  7. Hypothyroidism  - TSH  8. Gastroesophageal reflux disease  - BASIC METABOLIC PANEL WITH GFR  9. Fibromyalgia Syndrome  10. History of breast cancer  11. Screening for ischemic heart disease  12. Screening for colorectal cancer  - POC Hemoccult Bld/Stl   13. Medication management  - Urinalysis, Routine w reflex microscopic - Microalbumin / creatinine urine ratio -  CBC with Differential/Platelet - BASIC METABOLIC PANEL WITH GFR - Hepatic function panel - Magnesium - Lipid panel - TSH - Hemoglobin A1c - Insulin, random - VITAMIN D 25 Hydroxy        Patient was counseled in prudent diet to achieve/maintain BMI less than 25 for weight control, BP monitoring, regular exercise and medications. Discussed med's effects and SE's. Screening labs and tests as requested with regular follow-up as recommended. Over 40 minutes of exam, counseling, chart review and high complex critical decision making was performed.

## 2017-12-17 NOTE — Patient Instructions (Signed)

## 2017-12-18 LAB — VITAMIN D 25 HYDROXY (VIT D DEFICIENCY, FRACTURES): Vit D, 25-Hydroxy: 112 ng/mL — ABNORMAL HIGH (ref 30–100)

## 2017-12-18 LAB — LIPID PANEL
Cholesterol: 134 mg/dL (ref <200)
HDL: 43 mg/dL — AB (ref >50)
LDL CHOLESTEROL (CALC): 69 mg/dL
NON-HDL CHOLESTEROL (CALC): 91 mg/dL (ref <130)
Total CHOL/HDL Ratio: 3.1 (calc) (ref <5.0)
Triglycerides: 138 mg/dL (ref <150)

## 2017-12-18 LAB — CBC WITH DIFFERENTIAL/PLATELET
BASOS PCT: 0.5 %
Basophils Absolute: 50 cells/uL (ref 0–200)
EOS PCT: 1.2 %
Eosinophils Absolute: 119 cells/uL (ref 15–500)
HEMATOCRIT: 42.1 % (ref 35.0–45.0)
Hemoglobin: 13.8 g/dL (ref 11.7–15.5)
LYMPHS ABS: 3495 {cells}/uL (ref 850–3900)
MCH: 27.1 pg (ref 27.0–33.0)
MCHC: 32.8 g/dL (ref 32.0–36.0)
MCV: 82.5 fL (ref 80.0–100.0)
MPV: 11.3 fL (ref 7.5–12.5)
Monocytes Relative: 5.9 %
NEUTROS PCT: 57.1 %
Neutro Abs: 5653 cells/uL (ref 1500–7800)
PLATELETS: 266 10*3/uL (ref 140–400)
RBC: 5.1 10*6/uL (ref 3.80–5.10)
RDW: 12.7 % (ref 11.0–15.0)
TOTAL LYMPHOCYTE: 35.3 %
WBC mixed population: 584 cells/uL (ref 200–950)
WBC: 9.9 10*3/uL (ref 3.8–10.8)

## 2017-12-18 LAB — TSH: TSH: 0.7 m[IU]/L (ref 0.40–4.50)

## 2017-12-18 LAB — HEPATIC FUNCTION PANEL
AG Ratio: 1.6 (calc) (ref 1.0–2.5)
ALBUMIN MSPROF: 4.1 g/dL (ref 3.6–5.1)
ALT: 26 U/L (ref 6–29)
AST: 21 U/L (ref 10–35)
Alkaline phosphatase (APISO): 63 U/L (ref 33–130)
BILIRUBIN DIRECT: 0.1 mg/dL (ref 0.0–0.2)
BILIRUBIN TOTAL: 0.4 mg/dL (ref 0.2–1.2)
Globulin: 2.6 g/dL (calc) (ref 1.9–3.7)
Indirect Bilirubin: 0.3 mg/dL (calc) (ref 0.2–1.2)
Total Protein: 6.7 g/dL (ref 6.1–8.1)

## 2017-12-18 LAB — HEMOGLOBIN A1C
EAG (MMOL/L): 6.5 (calc)
Hgb A1c MFr Bld: 5.7 % of total Hgb — ABNORMAL HIGH (ref <5.7)
Mean Plasma Glucose: 117 (calc)

## 2017-12-18 LAB — BASIC METABOLIC PANEL WITH GFR
BUN: 11 mg/dL (ref 7–25)
CALCIUM: 9.4 mg/dL (ref 8.6–10.4)
CO2: 29 mmol/L (ref 20–32)
CREATININE: 0.62 mg/dL (ref 0.50–0.99)
Chloride: 105 mmol/L (ref 98–110)
GFR, EST AFRICAN AMERICAN: 114 mL/min/{1.73_m2} (ref >=60)
GFR, EST NON AFRICAN AMERICAN: 98 mL/min/{1.73_m2} (ref >=60)
Glucose, Bld: 113 mg/dL — ABNORMAL HIGH (ref 65–99)
POTASSIUM: 4.3 mmol/L (ref 3.5–5.3)
Sodium: 143 mmol/L (ref 135–146)

## 2017-12-18 LAB — MAGNESIUM: MAGNESIUM: 2 mg/dL (ref 1.5–2.5)

## 2017-12-18 LAB — INSULIN, RANDOM: INSULIN: 37.1 u[IU]/mL — AB (ref 2.0–19.6)

## 2017-12-28 HISTORY — PX: CATARACT EXTRACTION W/ INTRAOCULAR LENS IMPLANT: SHX1309

## 2018-01-15 DIAGNOSIS — Z79899 Other long term (current) drug therapy: Secondary | ICD-10-CM | POA: Diagnosis not present

## 2018-01-15 DIAGNOSIS — I10 Essential (primary) hypertension: Secondary | ICD-10-CM | POA: Diagnosis not present

## 2018-01-16 LAB — URINALYSIS, ROUTINE W REFLEX MICROSCOPIC
Bilirubin Urine: NEGATIVE
GLUCOSE, UA: NEGATIVE
HGB URINE DIPSTICK: NEGATIVE
Ketones, ur: NEGATIVE
LEUKOCYTES UA: NEGATIVE
Nitrite: NEGATIVE
PH: 6 (ref 5.0–8.0)
PROTEIN: NEGATIVE
Specific Gravity, Urine: 1.013 (ref 1.001–1.03)

## 2018-01-16 LAB — MICROALBUMIN / CREATININE URINE RATIO
Creatinine, Urine: 104 mg/dL (ref 20–275)
MICROALB/CREAT RATIO: 3 ug/mg{creat} (ref <30)
Microalb, Ur: 0.3 mg/dL

## 2018-01-28 ENCOUNTER — Other Ambulatory Visit: Payer: Self-pay | Admitting: Gastroenterology

## 2018-01-28 DIAGNOSIS — H2511 Age-related nuclear cataract, right eye: Secondary | ICD-10-CM | POA: Diagnosis not present

## 2018-01-28 NOTE — Telephone Encounter (Signed)
Refills needed for levsin 0.125mg  every 6 hours as needed SL tabs Last seen 10-30-2017.

## 2018-02-11 DIAGNOSIS — H2511 Age-related nuclear cataract, right eye: Secondary | ICD-10-CM | POA: Diagnosis not present

## 2018-02-14 DIAGNOSIS — H2511 Age-related nuclear cataract, right eye: Secondary | ICD-10-CM | POA: Diagnosis not present

## 2018-02-14 DIAGNOSIS — H25811 Combined forms of age-related cataract, right eye: Secondary | ICD-10-CM | POA: Diagnosis not present

## 2018-03-03 ENCOUNTER — Other Ambulatory Visit: Payer: Self-pay | Admitting: Internal Medicine

## 2018-03-12 ENCOUNTER — Encounter: Payer: Self-pay | Admitting: Sports Medicine

## 2018-03-12 ENCOUNTER — Ambulatory Visit: Payer: Medicare Other | Admitting: Sports Medicine

## 2018-03-12 VITALS — BP 140/89 | HR 73

## 2018-03-12 DIAGNOSIS — M79674 Pain in right toe(s): Secondary | ICD-10-CM

## 2018-03-12 DIAGNOSIS — B351 Tinea unguium: Secondary | ICD-10-CM | POA: Diagnosis not present

## 2018-03-12 DIAGNOSIS — L601 Onycholysis: Secondary | ICD-10-CM | POA: Diagnosis not present

## 2018-03-12 DIAGNOSIS — L603 Nail dystrophy: Secondary | ICD-10-CM | POA: Diagnosis not present

## 2018-03-12 NOTE — Addendum Note (Signed)
Addended by: Celene Skeen A on: 03/12/2018 01:27 PM   Modules accepted: Orders

## 2018-03-12 NOTE — Progress Notes (Signed)
Subjective: Desiree Anderson is a 62 y.o. female patient seen today in office with complaint of mildly painful thickened and discolored Right 1st toenail. Patient is desiring treatment for nail changes; has tried OTC topicals/Medication and ABX by PCP in the past with no improvement. Reports that nails are becoming difficult to manage because of the thickness and has had issue for 6 years. Patient has no other pedal complaints at this time.  Review of Systems  All other systems reviewed and are negative.     Patient Active Problem List   Diagnosis Date Noted  . Encounter for Medicare annual wellness exam 02/19/2017  . Tobacco chew use 05/15/2016  . Malignant neoplasm of upper-outer quadrant of right breast in female, estrogen receptor positive ([place]) 04/21/2016  . Hypothyroidism 04/18/2016  . Medication management 09/24/2014  . Cystocele, midline 03/23/2014  . Essential hypertension 12/10/2013  . Mixed hyperlipidemia 12/10/2013  . Prediabetes 12/10/2013  . Vitamin D deficiency 12/10/2013  . DJD  12/10/2013  . DDD, lumbar 12/10/2013  . Fibromyalgia Syndrome 12/10/2013  . Coitalgia 09/21/2011  . Alteration in bowel elimination: incontinence 09/21/2011  . Abnormal female sexual function 09/21/2011    Current Outpatient Medications on File Prior to Visit  Medication Sig Dispense Refill  . ALPRAZolam (XANAX) 0.5 MG tablet Take 1/2 to 1 tablet 2 to 3 x / day ONLY if needed for Anxiety Attacks and please try to limit to 5 days /week to avoid addiction (Patient taking differently: Take 0.25 mg by mouth daily. for Anxiety Attacks and please try to limit to 5 days /week to avoid addiction) 90 tablet 0  . cetirizine (ZYRTEC) 10 MG tablet Take 10 mg by mouth daily.     . Cholecalciferol (VITAMIN D-3) 5000 UNITS TABS Take 5,000 Units by mouth 2 (two) times daily.     . fluticasone (FLONASE) 50 MCG/ACT nasal spray 2 SPRAY EACH NOSTRIL TWICE DAILY AS NEEDED FOR ALLERGIES    . gabapentin (NEURONTIN) 600  MG tablet Take 1/2 to 1 tablet 2 to 3 x / day as need to control pain 270 tablet 1  . hyoscyamine (LEVSIN SL) 0.125 MG SL tablet DISSOLVE 1 TABLET IN MOUTH EVERY 6 HOURS AS NEEDED 60 tablet 3  . KLOR-CON M20 20 MEQ tablet TAKE 1 TABLET BY MOUTH TWICE DAILY 180 tablet 1  . levothyroxine (SYNTHROID, LEVOTHROID) 75 MCG tablet TAKE 1 TABLET BY MOUTH ONCE DAILY BEFORE BREAKFAST (Patient taking differently: TAKE 1 TABLET BY MOUTH ONCE DAILY BEFORE BREAKFAST MWFSS, .5 tab Tues and Thur) 90 tablet 1  . Multiple Vitamins-Minerals (GERITABS PO) Take 1 tablet by mouth daily. Reported on 06/08/2016    . ranitidine (ZANTAC) 300 MG tablet TAKE ONE TABLET BY MOUTH TWICE DAILY 180 tablet 3  . rosuvastatin (CRESTOR) 40 MG tablet TAKE ONE HALF TABLET BY MOUTH ONCE IN EVENING 45 tablet 1  . tamoxifen (NOLVADEX) 20 MG tablet Take 1 tablet (20 mg total) by mouth daily. 90 tablet 4  . triazolam (HALCION) 0.25 MG tablet TAKE 1 TABLET BY MOUTH AT BEDTIME (Patient taking differently: TAKE 1 HALF TABLET BY MOUTH AT BEDTIME AND ANOTHER HALF IF NEEDED FOR SLEEP) 30 tablet 2   No current facility-administered medications on file prior to visit.     Allergies  Allergen Reactions  . Iodinated Diagnostic Agents Other (See Comments)    Unknown   . Lipitor [Atorvastatin] Other (See Comments)    Muscle and legs aches  . Shellfish Allergy Diarrhea and Nausea And Vomiting  .  Betadine [Povidone Iodine] Rash    Objective: Physical Exam  General: Well developed, nourished, no acute distress, awake, alert and oriented x 3  Vascular: Dorsalis pedis artery 2/4 bilateral, Posterior tibial artery 2/4 bilateral, skin temperature warm to warm proximal to distal bilateral lower extremities, no varicosities, pedal hair present bilateral.  Neurological: Gross sensation present via light touch bilateral.   Dermatological: Skin is warm, dry, and supple bilateral, Right great toenail is short thick, and discolored with mild subungal  debris and old blood, mild trauma lines, no webspace macerations present bilateral, no open lesions present bilateral, no callus/corns/hyperkeratotic tissue present bilateral. No signs of infection bilateral.  Musculoskeletal: No symptomatic boney deformities noted bilateral. Muscular strength within normal limits without painon range of motion. No pain with calf compression bilateral.  Assessment and Plan:  Problem List Items Addressed This Visit    None    Visit Diagnoses    Nail fungus    -  Primary   Onycholysis       Pain of right great toe          -Examined patient -Discussed treatment options for painful dystrophic nails  -Patient declined nail avulsion procedure and opt for fungal culture  -Fungal culture was obtained by removing a portion of the hard nail itself from Right 1st toenail using a sterile nail nipper and sent to [facility] lab. Patient tolerated the biopsy procedure well without discomfort or need for anesthesia.  -Patient to return in 4 weeks for follow up evaluation and discussion of fungal culture results or sooner if symptoms worsen.  Landis Martins, DPM

## 2018-03-18 ENCOUNTER — Other Ambulatory Visit: Payer: Self-pay | Admitting: Obstetrics and Gynecology

## 2018-03-18 DIAGNOSIS — Z139 Encounter for screening, unspecified: Secondary | ICD-10-CM

## 2018-03-21 ENCOUNTER — Other Ambulatory Visit: Payer: Self-pay | Admitting: Obstetrics and Gynecology

## 2018-03-21 DIAGNOSIS — Z853 Personal history of malignant neoplasm of breast: Secondary | ICD-10-CM

## 2018-03-22 DIAGNOSIS — H02413 Mechanical ptosis of bilateral eyelids: Secondary | ICD-10-CM | POA: Diagnosis not present

## 2018-03-24 NOTE — Progress Notes (Signed)
MEDICARE ANNUAL WELLNESS VISIT AND FOLLOW UP  Assessment:   Diagnoses and all orders for this visit:  Encounter for Medicare annual wellness exam  Essential hypertension Continue medication Monitor blood pressure at home; call if consistently over 130/80 Continue DASH diet.   Reminder to go to the ER if any CP, SOB, nausea, dizziness, severe HA, changes vision/speech, left arm numbness and tingling and jaw pain.  Hypothyroidism, unspecified type continue medications the same pending lab results reminded to take on an empty stomach 30-49mins before food.  check TSH level  Fibromyalgia Syndrome Pain fairly managed by lifestyle and gabapentin at this time Stress management techniques discussed, increase water, good sleep hygiene discussed, increase exercise, and increase veggies.   Primary osteoarthritis involving multiple joints Managed at this office with PRN gabapentin and OTC analgesics Followed by Dr. Lorin Mercy as needed.   Cystocele, midline Followed by GYN No incontinence  Vitamin D deficiency Continue supplementation for goal of 70-100; above goal at recent check Check vitamin D level  Tobacco chew use cessation-  instruction/counseling given, counseled patient on the dangers of tobacco use, advised patient to stop tobacco, and reviewed strategies to maximize success, patient not ready to quit at this time, but is considering for the future  Prediabetes Discussed disease and risks Discussed diet/exercise, weight management  A1C  Mixed hyperlipidemia Continue medication Continue low cholesterol diet and exercise.  Check lipid panel.   Medication management CBC, CMP/GFR  Malignant neoplasm of upper-outer quadrant of right breast in female, estrogen receptor positive ([place]) S/p lumpectomy; Continue tamoxifen Followed by oncology  Obesity Long discussion about weight loss, diet, and exercise Recommended diet heavy in fruits and veggies and low in animal meats,  cheeses, and dairy products, appropriate calorie intake Discussed appropriate weight for height  Stop eating macarons daily - aim for 1 lb/ week weight loss Follow up at next visit  Anxiety Well managed by current regimen; continue medications, reviewed risks and precautions of benzos, do not take with other sedating medications Stress management techniques discussed, increase water, good sleep hygiene discussed, increase exercise, and increase veggies.   Insomnia - good sleep hygiene discussed, increase day time activity, try melatonin or benadryl Risks of benzos reviewed; advised to avoid daily use if possible by utilizing alternative agents  Over 40 minutes of exam, counseling, chart review and critical decision making was performed Future Appointments  Date Time Provider [place]  04/26/2018 10:50 AM [facility] TOMO 2 [facility]  06/28/2018 11:30 AM Unk Pinto, MD [facility] None  07/03/2018  1:00 PM [facility] 5 [facility] None  07/03/2018  1:30 PM Magrinat, Virgie Dad, MD [facility] None  01/02/2019  2:00 PM Unk Pinto, MD [facility] None     Plan:   During the course of the visit the patient was educated and counseled about appropriate screening and preventive services including:    Pneumococcal vaccine   Prevnar 13  Influenza vaccine  Td vaccine  Screening electrocardiogram  Bone densitometry screening  Colorectal cancer screening  Diabetes screening  Glaucoma screening  Nutrition counseling   Advanced directives: requested   Subjective:  Desiree Anderson is a 62 y.o. female who presents for Medicare Annual Wellness Visit and 3 month follow up. Patient has been on SS Disability since 2005 for Fibromyalgia, chronic Pain Syndrome  From DDD/DJD and Interstitial Cystitis. Patient relates that she has been able to taper off of her opioids and has found relief occasionally taking gabapentin. She is established with Dr. Lorin Mercy as needed  as  well. She was diagnosed with right breast cancer May 2017, has had R lumpectomy s/p radiation and is now on tamoxifen, follows with Dr. Jana Hakim.   she has a diagnosis of situational anxiety and insomnia and is currently on xanax 0.25-0.5 mg PRN anxiety (takes prior to car rides due to anxiety) and triazolam 0.125 mg at night for insomnia, reports symptoms are well controlled on current regimen. We discussed her being on two benzodiazepine agents today; she reports xanax has been ineffective for sleep but allows her to function during the day while taking if needed without excessive sedation. Cautioned on risks of use of benzos, she expresses understanding but strongly wishes to continue with current plan as this has been very effective despite risks.    BMI is Body mass index is 31.86 kg/m., she has not been working on diet and exercise. Wt Readings from Last 3 Encounters:  03/25/18 177 lb (80.3 kg)  12/17/17 165 lb (74.8 kg)  12/10/17 166 lb (75.3 kg)    Her blood pressure has been controlled at home, today their BP is BP: 138/78 She does not workout. She denies chest pain, shortness of breath, dizziness.   She is on cholesterol medication (crestor 20 mg daily) and denies myalgias. Her cholesterol is at goal. The cholesterol last visit was:   Lab Results  Component Value Date   CHOL 134 12/17/2017   HDL 43 (L) 12/17/2017   LDLCALC 69 12/17/2017   TRIG 138 12/17/2017   CHOLHDL 3.1 12/17/2017    She has not been working on diet and exercise for prediabetes, and denies increased appetite, nausea, paresthesia of the feet, polydipsia, polyuria, visual disturbances, vomiting and weight loss. Last A1C in the office was:  Lab Results  Component Value Date   HGBA1C 5.7 (H) 12/17/2017   She is on thyroid medication. Her medication was not changed last visit.   Lab Results  Component Value Date   TSH 0.70 12/17/2017   Last GFR: Lab Results  Component Value Date   GFRNONAA 98 12/17/2017    Patient is on Vitamin D supplement and above goal at recent check:    Lab Results  Component Value Date   VD25OH 112 (H) 12/17/2017      Medication Review: Current Outpatient Medications on File Prior to Visit  Medication Sig Dispense Refill  . ALPRAZolam (XANAX) 0.5 MG tablet Take 1/2 to 1 tablet 2 to 3 x / day ONLY if needed for Anxiety Attacks and please try to limit to 5 days /week to avoid addiction (Patient taking differently: Take 0.25 mg by mouth daily. for Anxiety Attacks and please try to limit to 5 days /week to avoid addiction) 90 tablet 0  . cetirizine (ZYRTEC) 10 MG tablet Take 10 mg by mouth daily.     . Cholecalciferol (VITAMIN D-3) 5000 UNITS TABS Take 5,000 Units by mouth 2 (two) times daily.     . fluticasone (FLONASE) 50 MCG/ACT nasal spray 2 SPRAY EACH NOSTRIL TWICE DAILY AS NEEDED FOR ALLERGIES    . gabapentin (NEURONTIN) 600 MG tablet Take 1/2 to 1 tablet 2 to 3 x / day as need to control pain 270 tablet 1  . hyoscyamine (LEVSIN SL) 0.125 MG SL tablet DISSOLVE 1 TABLET IN MOUTH EVERY 6 HOURS AS NEEDED 60 tablet 3  . KLOR-CON M20 20 MEQ tablet TAKE 1 TABLET BY MOUTH TWICE DAILY 180 tablet 1  . Multiple Vitamins-Minerals (GERITABS PO) Take 1 tablet by mouth daily. Reported on  06/08/2016    . ranitidine (ZANTAC) 300 MG tablet TAKE ONE TABLET BY MOUTH TWICE DAILY 180 tablet 3  . rosuvastatin (CRESTOR) 40 MG tablet TAKE ONE HALF TABLET BY MOUTH ONCE IN EVENING 45 tablet 1  . tamoxifen (NOLVADEX) 20 MG tablet Take 1 tablet (20 mg total) by mouth daily. 90 tablet 4  . triazolam (HALCION) 0.25 MG tablet TAKE 1 TABLET BY MOUTH AT BEDTIME (Patient taking differently: TAKE 1 HALF TABLET BY MOUTH AT BEDTIME AND ANOTHER HALF IF NEEDED FOR SLEEP) 30 tablet 2   No current facility-administered medications on file prior to visit.     Allergies  Allergen Reactions  . Iodinated Diagnostic Agents Other (See Comments)    Unknown   . Lipitor [Atorvastatin] Other (See Comments)     Muscle and legs aches  . Shellfish Allergy Diarrhea and Nausea And Vomiting  . Betadine [Povidone Iodine] Rash    Current Problems (verified) Patient Active Problem List   Diagnosis Date Noted  . Encounter for Medicare annual wellness exam 02/19/2017  . Tobacco chew use 05/15/2016  . Malignant neoplasm of upper-outer quadrant of right breast in female, estrogen receptor positive ([place]) 04/21/2016  . Hypothyroidism 04/18/2016  . BMI 29.0-29.9,adult 10/11/2015  . Medication management 09/24/2014  . Cystocele, midline 03/23/2014  . Essential hypertension 12/10/2013  . Mixed hyperlipidemia 12/10/2013  . Prediabetes 12/10/2013  . Vitamin D deficiency 12/10/2013  . DJD  12/10/2013  . DDD, lumbar 12/10/2013  . Fibromyalgia Syndrome 12/10/2013  . Coitalgia 09/21/2011  . Alteration in bowel elimination: incontinence 09/21/2011  . Abnormal female sexual function 09/21/2011    Screening Tests Immunization History  Administered Date(s) Administered  . Influenza Inj Mdck Quad With Preservative 09/04/2017  . Influenza Split 09/18/2013, 09/25/2014  . Influenza, Seasonal, Injecte, Preservative Fre 10/11/2015  . Influenza,inj,quad, With Preservative 08/28/2016  . Pneumococcal-Unspecified 11/27/2000  . Tdap 03/16/2011   Preventative care: Last colonoscopy: over 13 years ago Cologuard: 02/2017 negative Last mammogram: 03/2017 Last pap smear/pelvic exam: 2017, at GYN Dr. Harrington Challenger DEXA:N/A CT AB 2014 CT head 2013 Echo 2014 Stress test 2014  Prior vaccinations: TD or Tdap: 2012  Influenza: 2018 Pneumococcal: 2002 Prevnar13: DUE age 86 Shingles/Zostavax: interested in shingrix, information provided  Names of Other Physician/Practitioners you currently use: 1. [facility] here for primary care 2. [facility], Dr. Manuella Ghazi, eye doctor, last visit 02/2018 3. Dr. Minerva Fester, dentist, last visit 5 years ago - encouraged follow up at least annually  Patient  Care Team: Unk Pinto, MD as PCP - General (Internal Medicine) Fay Records, MD as Consulting Physician (Cardiology) [name], MD as Consulting Physician (Orthopedic Surgery) Carolan Clines, MD as Consulting Physician (Urology) Vanessa Kick, MD as Consulting Physician (Obstetrics and Gynecology) Magrinat, Virgie Dad, MD as Consulting Physician (Oncology) Rolm Bookbinder, MD as Consulting Physician (General Surgery) Thea Silversmith, MD (Inactive) as Consulting Physician (Radiation Oncology)  SURGICAL HISTORY She  has a past surgical history that includes Incontinence surgery (2009  &  2005); BLADDER TACK (2003); Vein bypass surgery (AGE 43); transthoracic echocardiogram (06-16-2013); Cardiovascular stress test (07-02-2013  [facility]); CYSTO/  HYDRODISTENTION/  BLADDER BX/   INSTILLATION THERAPY (08-29-2010); Vaginal hysterectomy (2000); Cataract extraction w/ intraocular lens implant (Left, 01/2014); Excision of mesh (N/A, 03/23/2014); Excision vaginal cyst (N/A, 04/06/2014); Radioactive seed guided mastectomy with axillary sentinel lymph node biopsy (Right, 05/18/2016); Breast lumpectomy (1985); Breast lumpectomy (Right, 05/18/2016); Breast biopsy (Right, 04/18/2016); Eye surgery; and Cholecystectomy (N/A, 12/10/2017). FAMILY HISTORY  Her family history includes Diabetes in her mother; Diverticulitis in her mother; Intestinal polyp in her sister; Lung cancer in her brother. SOCIAL HISTORY She  reports that she quit smoking about 22 years ago. Her smoking use included cigarettes. She quit after 20.00 years of use. Her smokeless tobacco use includes snuff. She reports that she does not drink alcohol or use drugs.   MEDICARE WELLNESS OBJECTIVES: Physical activity: Current Exercise Habits: The patient does not participate in regular exercise at present, Exercise limited by: orthopedic condition(s) Cardiac risk factors: Cardiac Risk Factors include: advanced age (>14men, >49  women) Depression/mood screen:   Depression screen [facility] 2/9 03/25/2018  Decreased Interest 0  Down, Depressed, Hopeless 0  PHQ - 2 Score 0    ADLs:  In your present state of health, do you have any difficulty performing the following activities: 03/25/2018 12/17/2017  Hearing? N N  Vision? Y N  Comment Blurriness, recent cataract surgery, seeing Dr. Manuella Ghazi -  Difficulty concentrating or making decisions? N N  Walking or climbing stairs? Y N  Comment Chronic pain, can get around in her house -  Dressing or bathing? N N  Doing errands, shopping? N N  Preparing Food and eating ? N -  Using the Toilet? N -  In the past six months, have you accidently leaked urine? N -  Do you have problems with loss of bowel control? N -  Managing your Medications? N -  Managing your Finances? N -  Housekeeping or managing your Housekeeping? N -  Some recent data might be hidden     Cognitive Testing  Alert? Yes  Normal Appearance?Yes  Oriented to person? Yes  Place? Yes   Time? Yes  Recall of three objects?  Yes  Can perform simple calculations? Yes  Displays appropriate judgment?Yes  Can read the correct time from a watch face?Yes  EOL planning: Does Patient Have a Medical Advance Directive?: No Would patient like information on creating a medical advance directive?: No - Patient declined  Review of Systems  Constitutional: Negative for malaise/fatigue and weight loss.  HENT: Negative for hearing loss and tinnitus.   Eyes: Negative for blurred vision and double vision.  Respiratory: Negative for cough, sputum production, shortness of breath and wheezing.   Cardiovascular: Negative for chest pain, palpitations, orthopnea, claudication, leg swelling and PND.  Gastrointestinal: Negative for abdominal pain, blood in stool, constipation, diarrhea, heartburn, melena, nausea and vomiting.  Genitourinary: Negative.   Musculoskeletal: Positive for back pain. Negative for falls, joint pain and myalgias.   Skin: Negative for rash.  Neurological: Negative for dizziness, tingling, sensory change, weakness and headaches.  Endo/Heme/Allergies: Negative for polydipsia.  Psychiatric/Behavioral: Negative for depression, memory loss, substance abuse and suicidal ideas. The patient is nervous/anxious (With driving) and has insomnia.   All other systems reviewed and are negative.    Objective:     Today's Vitals   03/25/18 1133  BP: 138/78  Pulse: 81  Temp: (!) 97.5 F (36.4 C)  SpO2: 98%  Weight: 177 lb (80.3 kg)  Height: 5' 2.5" (1.588 m)  PainSc: 8   PainLoc: Back   Body mass index is 31.86 kg/m.  General appearance: alert, no distress, WD/WN, female HEENT: normocephalic, sclerae anicteric, TMs pearly, nares patent, no discharge or erythema, pharynx normal Oral cavity: MMM, no lesions Neck: supple, no lymphadenopathy, no thyromegaly, no masses Heart: RRR, normal S1, S2, no murmurs Lungs: CTA bilaterally, no wheezes, rhonchi, or rales Abdomen: +bs, soft, non tender, non  distended, no masses, no hepatomegaly, no splenomegaly Musculoskeletal: nontender, no swelling, no obvious deformity Extremities: no edema, no cyanosis, no clubbing Pulses: 2+ symmetric, upper and lower extremities, normal cap refill Neurological: alert, oriented x 3, CN2-12 intact, strength normal upper extremities and lower extremities, sensation normal throughout, DTRs 2+ throughout, no cerebellar signs, gait normal  Psychiatric: normal affect, behavior normal, pleasant   Medicare Attestation I have personally reviewed: [name] medical and social history Their use of alcohol, tobacco or illicit drugs Their current medications and supplements [name] functional ability including ADLs,fall risks, home safety risks, cognitive, and hearing and visual impairment Diet and physical activities Evidence for depression or mood disorders  [name] weight, height, BMI, and visual acuity have been  recorded in the chart.  I have made referrals, counseling, and provided education to the patient based on review of the above and I have provided the patient with a written personalized care plan for preventive services.     Izora Ribas, NP   03/25/2018

## 2018-03-25 ENCOUNTER — Other Ambulatory Visit: Payer: Self-pay | Admitting: Internal Medicine

## 2018-03-25 ENCOUNTER — Encounter: Payer: Self-pay | Admitting: Adult Health

## 2018-03-25 ENCOUNTER — Ambulatory Visit: Payer: Medicare Other | Admitting: Adult Health

## 2018-03-25 VITALS — BP 138/78 | HR 81 | Temp 97.5°F | Ht 62.5 in | Wt 177.0 lb

## 2018-03-25 DIAGNOSIS — F418 Other specified anxiety disorders: Secondary | ICD-10-CM

## 2018-03-25 DIAGNOSIS — M150 Primary generalized (osteo)arthritis: Secondary | ICD-10-CM | POA: Diagnosis not present

## 2018-03-25 DIAGNOSIS — Z0000 Encounter for general adult medical examination without abnormal findings: Secondary | ICD-10-CM

## 2018-03-25 DIAGNOSIS — N8111 Cystocele, midline: Secondary | ICD-10-CM | POA: Diagnosis not present

## 2018-03-25 DIAGNOSIS — E039 Hypothyroidism, unspecified: Secondary | ICD-10-CM

## 2018-03-25 DIAGNOSIS — E559 Vitamin D deficiency, unspecified: Secondary | ICD-10-CM

## 2018-03-25 DIAGNOSIS — F529 Unspecified sexual dysfunction not due to a substance or known physiological condition: Secondary | ICD-10-CM

## 2018-03-25 DIAGNOSIS — C50411 Malignant neoplasm of upper-outer quadrant of right female breast: Secondary | ICD-10-CM

## 2018-03-25 DIAGNOSIS — G4700 Insomnia, unspecified: Secondary | ICD-10-CM

## 2018-03-25 DIAGNOSIS — Z79899 Other long term (current) drug therapy: Secondary | ICD-10-CM | POA: Diagnosis not present

## 2018-03-25 DIAGNOSIS — M8949 Other hypertrophic osteoarthropathy, multiple sites: Secondary | ICD-10-CM

## 2018-03-25 DIAGNOSIS — M159 Polyosteoarthritis, unspecified: Secondary | ICD-10-CM

## 2018-03-25 DIAGNOSIS — Z170 Estrogen receptor positive status [ER+]: Secondary | ICD-10-CM

## 2018-03-25 DIAGNOSIS — Z0001 Encounter for general adult medical examination with abnormal findings: Secondary | ICD-10-CM | POA: Diagnosis not present

## 2018-03-25 DIAGNOSIS — M797 Fibromyalgia: Secondary | ICD-10-CM

## 2018-03-25 DIAGNOSIS — R7303 Prediabetes: Secondary | ICD-10-CM

## 2018-03-25 DIAGNOSIS — I10 Essential (primary) hypertension: Secondary | ICD-10-CM | POA: Diagnosis not present

## 2018-03-25 DIAGNOSIS — R6889 Other general symptoms and signs: Secondary | ICD-10-CM

## 2018-03-25 DIAGNOSIS — E66811 Obesity, class 1: Secondary | ICD-10-CM

## 2018-03-25 DIAGNOSIS — E669 Obesity, unspecified: Secondary | ICD-10-CM

## 2018-03-25 DIAGNOSIS — R159 Full incontinence of feces: Secondary | ICD-10-CM

## 2018-03-25 DIAGNOSIS — IMO0001 Reserved for inherently not codable concepts without codable children: Secondary | ICD-10-CM

## 2018-03-25 DIAGNOSIS — E782 Mixed hyperlipidemia: Secondary | ICD-10-CM | POA: Diagnosis not present

## 2018-03-25 DIAGNOSIS — Z720 Tobacco use: Secondary | ICD-10-CM | POA: Diagnosis not present

## 2018-03-25 LAB — COMPLETE METABOLIC PANEL WITH GFR
AG RATIO: 1.6 (calc) (ref 1.0–2.5)
ALBUMIN MSPROF: 4 g/dL (ref 3.6–5.1)
ALT: 22 U/L (ref 6–29)
AST: 22 U/L (ref 10–35)
Alkaline phosphatase (APISO): 64 U/L (ref 33–130)
BILIRUBIN TOTAL: 0.3 mg/dL (ref 0.2–1.2)
BUN: 11 mg/dL (ref 7–25)
CHLORIDE: 105 mmol/L (ref 98–110)
CO2: 29 mmol/L (ref 20–32)
Calcium: 9 mg/dL (ref 8.6–10.4)
Creat: 0.61 mg/dL (ref 0.50–0.99)
GFR, EST AFRICAN AMERICAN: 114 mL/min/{1.73_m2} (ref >=60)
GFR, Est Non African American: 99 mL/min/{1.73_m2} (ref >=60)
GLOBULIN: 2.5 g/dL (ref 1.9–3.7)
Glucose, Bld: 94 mg/dL (ref 65–99)
POTASSIUM: 4.5 mmol/L (ref 3.5–5.3)
SODIUM: 142 mmol/L (ref 135–146)
TOTAL PROTEIN: 6.5 g/dL (ref 6.1–8.1)

## 2018-03-25 LAB — CBC WITH DIFFERENTIAL/PLATELET
BASOS PCT: 0.8 %
Basophils Absolute: 59 cells/uL (ref 0–200)
EOS ABS: 81 {cells}/uL (ref 15–500)
Eosinophils Relative: 1.1 %
HCT: 38.4 % (ref 35.0–45.0)
Hemoglobin: 13 g/dL (ref 11.7–15.5)
Lymphs Abs: 2716 cells/uL (ref 850–3900)
MCH: 27.8 pg (ref 27.0–33.0)
MCHC: 33.9 g/dL (ref 32.0–36.0)
MCV: 82.2 fL (ref 80.0–100.0)
MPV: 10 fL (ref 7.5–12.5)
Monocytes Relative: 7.4 %
Neutro Abs: 3996 cells/uL (ref 1500–7800)
Neutrophils Relative %: 54 %
PLATELETS: 257 10*3/uL (ref 140–400)
RBC: 4.67 10*6/uL (ref 3.80–5.10)
RDW: 12.9 % (ref 11.0–15.0)
TOTAL LYMPHOCYTE: 36.7 %
WBC: 7.4 10*3/uL (ref 3.8–10.8)
WBCMIX: 548 {cells}/uL (ref 200–950)

## 2018-03-25 LAB — LIPID PANEL
Cholesterol: 163 mg/dL (ref <200)
HDL: 58 mg/dL (ref >50)
LDL Cholesterol (Calc): 81 mg/dL (calc)
Non-HDL Cholesterol (Calc): 105 mg/dL (calc) (ref <130)
Total CHOL/HDL Ratio: 2.8 (calc) (ref <5.0)
Triglycerides: 144 mg/dL (ref <150)

## 2018-03-25 LAB — TSH: TSH: 5.31 m[IU]/L — AB (ref 0.40–4.50)

## 2018-03-25 NOTE — Patient Instructions (Addendum)
   Ask insurance and pharmacy about shingrix - new vaccine   Can go to https://www.cdc.gov/vaccines/vpd/shingles/public/shingrix/index.html for more information  Shingrix Vaccination  Two vaccines are licensed and recommended to prevent shingles in the [place] Zoster vaccine live (ZVL, Zostavax) has been in use since 2006. Recombinant zoster vaccine (RZV, Shingrix), has been in use since 2017 and is recommended by [employer] as the preferred shingles vaccine.  What Everyone Should Know about Shingles Vaccine (Shingrix) One of the Recommended Vaccines by Disease Shingles vaccination is the only way to protect against shingles and postherpetic neuralgia (PHN), the most common complication from shingles. CDC recommends that healthy adults 50 years and older get two doses of the shingles vaccine called Shingrix (recombinant zoster vaccine), separated by 2 to 6 months, to prevent shingles and the complications from the disease. Your doctor or pharmacist can give you Shingrix as a shot in your upper arm. Shingrix provides strong protection against shingles and PHN. Two doses of Shingrix is more than 90% effective at preventing shingles and PHN. Protection stays above 85% for at least the first four years after you get vaccinated. Shingrix is the preferred vaccine, over Zostavax (zoster vaccine live), a shingles vaccine in use since 2006. Zostavax may still be used to prevent shingles in healthy adults 60 years and older. For example, you could use Zostavax if a person is allergic to Shingrix, prefers Zostavax, or requests immediate vaccination and Shingrix is unavailable. Who Should Get Shingrix? Healthy adults 50 years and older should get two doses of Shingrix, separated by 2 to 6 months. You should get Shingrix even if in the past you . had shingles  . received Zostavax  . are not sure if you had chickenpox There is no maximum age for getting Shingrix. If you had shingles in the past, you can get  Shingrix to help prevent future occurrences of the disease. There is no specific length of time that you need to wait after having shingles before you can receive Shingrix, but generally you should make sure the shingles rash has gone away before getting vaccinated. You can get Shingrix whether or not you remember having had chickenpox in the past. Studies show that more than 99% of Americans 40 years and older have had chickenpox, even if they don't remember having the disease. Chickenpox and shingles are related because they are caused by the same virus (varicella zoster virus). After a person recovers from chickenpox, the virus stays dormant (inactive) in the body. It can reactivate years later and cause shingles. If you had Zostavax in the recent past, you should wait at least eight weeks before getting Shingrix. Talk to your healthcare provider to determine the best time to get Shingrix. Shingrix is available in doctor's offices and pharmacies. To find doctor's offices or pharmacies near you that offer the vaccine, visit [facility] Vaccine FinderExternal. If you have questions about Shingrix, talk with your healthcare provider. Vaccine for Those 50 Years and Older  Shingrix reduces the risk of shingles and PHN by more than 90% in people 50 and older. CDC recommends the vaccine for healthy adults 50 and older.  Who Should Not Get Shingrix? You should not get Shingrix if you: . have ever had a severe allergic reaction to any component of the vaccine or after a dose of Shingrix  . tested negative for immunity to varicella zoster virus. If you test negative, you should get chickenpox vaccine.  . currently have shingles  . currently are pregnant or breastfeeding.   Women who are pregnant or breastfeeding should wait to get Shingrix.  . receive specific antiviral drugs (acyclovir, famciclovir, or valacyclovir) 24 hours before vaccination (avoid use of these antiviral drugs for 14 days after vaccination)-  zoster vaccine live only If you have a minor acute (starts suddenly) illness, such as a cold, you may get Shingrix. But if you have a moderate or severe acute illness, you should usually wait until you recover before getting the vaccine. This includes anyone with a temperature of 101.3F or higher. The side effects of the Shingrix are temporary, and usually last 2 to 3 days. While you may experience pain for a few days after getting Shingrix, the pain will be less severe than having shingles and the complications from the disease. How Well Does Shingrix Work? Two doses of Shingrix provides strong protection against shingles and postherpetic neuralgia (PHN), the most common complication of shingles. . In adults 50 to 60 years old who got two doses, Shingrix was 97% effective in preventing shingles; among adults 70 years and older, Shingrix was 91% effective.  . In adults 50 to 60 years old who got two doses, Shingrix was 91% effective in preventing PHN; among adults 70 years and older, Shingrix was 89% effective. Shingrix protection remained high (more than 85%) in people 70 years and older throughout the four years following vaccination. Since your risk of shingles and PHN increases as you get older, it is important to have strong protection against shingles in your older years. Top of Page  What Are the Possible Side Effects of Shingrix? Studies show that Shingrix is safe. The vaccine helps your body create a strong defense against shingles. As a result, you are likely to have temporary side effects from getting the shots. The side effects may affect your ability to do normal daily activities for 2 to 3 days. Most people got a sore arm with mild or moderate pain after getting Shingrix, and some also had redness and swelling where they got the shot. Some people felt tired, had muscle pain, a headache, shivering, fever, stomach pain, or nausea. About 1 out of 6 people who got Shingrix experienced side  effects that prevented them from doing regular activities. Symptoms went away on their own in about 2 to 3 days. Side effects were more common in younger people. You might have a reaction to the first or second dose of Shingrix, or both doses. If you experience side effects, you may choose to take over-the-counter pain medicine such as ibuprofen or acetaminophen. If you experience side effects from Shingrix, you should report them to the Vaccine Adverse Event Reporting System (VAERS). Your doctor might file this report, or you can do it yourself through the VAERS websiteExternal, or by calling 1-800-822-7967. If you have any questions about side effects from Shingrix, talk with your doctor. The shingles vaccine does not contain thimerosal (a preservative containing mercury). Top of Page  When Should I See a Doctor Because of the Side Effects I Experience From Shingrix? In clinical trials, Shingrix was not associated with serious adverse events. In fact, serious side effects from vaccines are extremely rare. For example, for every 1 million doses of a vaccine given, only one or two people may have a severe allergic reaction. Signs of an allergic reaction happen within minutes or hours after vaccination and include hives, swelling of the face and throat, difficulty breathing, a fast heartbeat, dizziness, or weakness. If you experience these or any other life-threatening symptoms, see a   doctor right away. Shingrix causes a strong response in your immune system, so it may produce short-term side effects more intense than you are used to from other vaccines. These side effects can be uncomfortable, but they are expected and usually go away on their own in 2 or 3 days. Top of Page  How Can I Pay For Shingrix? There are several ways shingles vaccine may be paid for: Medicare . Medicare Part D plans cover the shingles vaccine, but there may be a cost to you depending on your plan. There may be a copay for the  vaccine, or you may need to pay in full then get reimbursed for a certain amount.  . Medicare Part B does not cover the shingles vaccine. Medicaid . Medicaid may or may not cover the vaccine. Contact your insurer to find out. Private health insurance . Many private health insurance plans will cover the vaccine, but there may be a cost to you depending on your plan. Contact your insurer to find out. Vaccine assistance programs . Some pharmaceutical companies provide vaccines to eligible adults who cannot afford them. You may want to check with the vaccine manufacturer, [employer], about Shingrix. If you do not currently have health insurance, learn more about affordable health coverage optionsExternal. To find doctor's offices or pharmacies near you that offer the vaccine, visit HealthMap Vaccine FinderExternal.  

## 2018-03-26 LAB — VITAMIN D 25 HYDROXY (VIT D DEFICIENCY, FRACTURES): Vit D, 25-Hydroxy: 76 ng/mL (ref 30–100)

## 2018-03-26 LAB — HEMOGLOBIN A1C
HEMOGLOBIN A1C: 5.8 %{Hb} — AB (ref <5.7)
Mean Plasma Glucose: 120 (calc)
eAG (mmol/L): 6.6 (calc)

## 2018-04-08 DIAGNOSIS — H02413 Mechanical ptosis of bilateral eyelids: Secondary | ICD-10-CM | POA: Diagnosis not present

## 2018-04-24 DIAGNOSIS — H04123 Dry eye syndrome of bilateral lacrimal glands: Secondary | ICD-10-CM | POA: Diagnosis not present

## 2018-04-26 ENCOUNTER — Ambulatory Visit
Admission: RE | Admit: 2018-04-26 | Discharge: 2018-04-26 | Disposition: A | Discharge status: Home / Self Care | Payer: Medicare Other | Source: Ambulatory Visit | Attending: Obstetrics and Gynecology | Admitting: Obstetrics and Gynecology

## 2018-04-26 DIAGNOSIS — Z853 Personal history of malignant neoplasm of breast: Secondary | ICD-10-CM

## 2018-04-26 DIAGNOSIS — R928 Other abnormal and inconclusive findings on diagnostic imaging of breast: Secondary | ICD-10-CM | POA: Diagnosis not present

## 2018-04-29 ENCOUNTER — Other Ambulatory Visit: Payer: Self-pay | Admitting: Internal Medicine

## 2018-04-29 DIAGNOSIS — H02413 Mechanical ptosis of bilateral eyelids: Secondary | ICD-10-CM | POA: Diagnosis not present

## 2018-05-05 ENCOUNTER — Other Ambulatory Visit: Payer: Self-pay | Admitting: Physician Assistant

## 2018-05-05 DIAGNOSIS — G4700 Insomnia, unspecified: Secondary | ICD-10-CM

## 2018-05-07 ENCOUNTER — Ambulatory Visit: Payer: Medicare Other | Admitting: Sports Medicine

## 2018-05-07 ENCOUNTER — Encounter: Payer: Self-pay | Admitting: Sports Medicine

## 2018-05-07 DIAGNOSIS — M79674 Pain in right toe(s): Secondary | ICD-10-CM | POA: Diagnosis not present

## 2018-05-07 DIAGNOSIS — B351 Tinea unguium: Secondary | ICD-10-CM

## 2018-05-07 LAB — HEPATIC FUNCTION PANEL
AG RATIO: 1.8 (calc) (ref 1.0–2.5)
ALKALINE PHOSPHATASE (APISO): 64 U/L (ref 33–130)
ALT: 26 U/L (ref 6–29)
AST: 23 U/L (ref 10–35)
Albumin: 4.1 g/dL (ref 3.6–5.1)
BILIRUBIN DIRECT: 0.1 mg/dL (ref 0.0–0.2)
BILIRUBIN INDIRECT: 0.3 mg/dL (ref 0.2–1.2)
GLOBULIN: 2.3 g/dL (ref 1.9–3.7)
TOTAL PROTEIN: 6.4 g/dL (ref 6.1–8.1)
Total Bilirubin: 0.4 mg/dL (ref 0.2–1.2)

## 2018-05-07 NOTE — Progress Notes (Addendum)
Subjective: Desiree Anderson is a 62 y.o. female patient seen today in office for fungal culture results. Patient has no other pedal complaints at this time.   Patient Active Problem List   Diagnosis Date Noted  . Insomnia 03/25/2018  . Situational anxiety 03/25/2018  . Encounter for Medicare annual wellness exam 02/19/2017  . Tobacco chew use 05/15/2016  . Malignant neoplasm of upper-outer quadrant of right breast in female, estrogen receptor positive ([place]) 04/21/2016  . Hypothyroidism 04/18/2016  . Obesity (BMI 30.0-34.9) 10/11/2015  . Medication management 09/24/2014  . Cystocele, midline 03/23/2014  . Essential hypertension 12/10/2013  . Mixed hyperlipidemia 12/10/2013  . Prediabetes 12/10/2013  . Vitamin D deficiency 12/10/2013  . DJD  12/10/2013  . DDD, lumbar 12/10/2013  . Fibromyalgia Syndrome 12/10/2013    Current Outpatient Medications on File Prior to Visit  Medication Sig Dispense Refill  . ALPRAZolam (XANAX) 0.5 MG tablet Take 1/2 to 1 tablet 2 to 3 x / day ONLY if needed for Anxiety Attacks and please try to limit to 5 days /week to avoid addiction (Patient taking differently: Take 0.25 mg by mouth daily. for Anxiety Attacks and please try to limit to 5 days /week to avoid addiction) 90 tablet 0  . cetirizine (ZYRTEC) 10 MG tablet Take 10 mg by mouth daily.     . Cholecalciferol (VITAMIN D-3) 5000 UNITS TABS Take 5,000 Units by mouth 2 (two) times daily.     . fluticasone (FLONASE) 50 MCG/ACT nasal spray 2 SPRAY EACH NOSTRIL TWICE DAILY AS NEEDED FOR ALLERGIES    . gabapentin (NEURONTIN) 600 MG tablet Take 1/2 to 1 tablet 2 to 3 x / day as need to control pain 270 tablet 1  . hyoscyamine (LEVSIN SL) 0.125 MG SL tablet DISSOLVE 1 TABLET IN MOUTH EVERY 6 HOURS AS NEEDED 60 tablet 3  . levothyroxine (SYNTHROID, LEVOTHROID) 75 MCG tablet TAKE 1 TABLET BY MOUTH ONCE DAILY BEFORE BREAKFAST 90 tablet 1  . Multiple Vitamins-Minerals (GERITABS PO) Take 1 tablet by mouth daily.  Reported on 06/08/2016    . potassium chloride SA (K-DUR,KLOR-CON) 20 MEQ tablet TAKE 1 TABLET BY MOUTH TWICE DAILY 180 tablet 1  . ranitidine (ZANTAC) 300 MG tablet TAKE ONE TABLET BY MOUTH TWICE DAILY 180 tablet 3  . rosuvastatin (CRESTOR) 40 MG tablet TAKE ONE HALF TABLET BY MOUTH ONCE IN EVENING 45 tablet 1  . tamoxifen (NOLVADEX) 20 MG tablet Take 1 tablet (20 mg total) by mouth daily. 90 tablet 4  . triazolam (HALCION) 0.25 MG tablet Take 1/2-1 tablet ONLY if needed for sleep & please try to limit to 5 days /week to avoid addiction 20 tablet 0   No current facility-administered medications on file prior to visit.     Allergies  Allergen Reactions  . Iodinated Diagnostic Agents Other (See Comments)    Unknown   . Lipitor [Atorvastatin] Other (See Comments)    Muscle and legs aches  . Shellfish Allergy Diarrhea and Nausea And Vomiting  . Betadine [Povidone Iodine] Rash    Objective: Physical Exam  General: Well developed, nourished, no acute distress, awake, alert and oriented x 3  Vascular: Dorsalis pedis artery 2/4 bilateral, Posterior tibial artery 2/4 bilateral, skin temperature warm to warm proximal to distal bilateral lower extremities, no varicosities, pedal hair present bilateral.  Neurological: Gross sensation present via light touch bilateral.   Dermatological: Skin is warm, dry, and supple bilateral, Right great toenail is short thick, and discolored with mild subungal debris  and old blood, mild trauma lines, no webspace macerations present bilateral, no open lesions present bilateral, no callus/corns/hyperkeratotic tissue present bilateral. No signs of infection bilateral.  Musculoskeletal: No symptomatic boney deformities noted bilateral. Muscular strength within normal limits without painon range of motion. No pain with calf compression bilateral.   Fungal culture + Yeast and Saphrotic Mold/fungi  Assessment and Plan:  Problem List Items Addressed This Visit     None    Visit Diagnoses    Nail fungus    -  Primary   Relevant Orders   Hepatic Function Panel   Pain of right great toe          -Examined patient -Discussed treatment options for painful mycotic nails -Patient opt for oral Itraconazole with full understanding of medication risks; ordered LFTs for review if within normal limits will proceed with sending Rx to pharmacy for Itraconazole Anticipate 12 week course.  -Advised good hygiene habits -Patient to return in 6 weeks for follow up evaluation or sooner if symptoms worsen.  Landis Martins, DPM

## 2018-05-09 ENCOUNTER — Telehealth: Payer: Self-pay | Admitting: *Deleted

## 2018-05-09 NOTE — Telephone Encounter (Signed)
-----   Message from Landis Martins, [place] sent at 05/08/2018  7:42 AM EDT ----- LFTs are normal please send Rx for Itraconazole 200mg  daily x 90 tabs to pharmacy If this medication is too expensive then we will send Lamisil. Also make sure patient has a med check appt for 6 weeks  Thanks Dr. Cannon Kettle

## 2018-05-09 NOTE — Telephone Encounter (Signed)
Left message to call for lab results and instructions.

## 2018-05-10 ENCOUNTER — Other Ambulatory Visit: Payer: Self-pay

## 2018-05-10 ENCOUNTER — Telehealth: Payer: Self-pay | Admitting: Sports Medicine

## 2018-05-10 MED ORDER — ITRACONAZOLE 100 MG PO CAPS: 200.0000 mg | ORAL_CAPSULE | Freq: Every day | ORAL | 0 refills | Status: DC

## 2018-05-10 MED ORDER — TERBINAFINE HCL 250 MG PO TABS: 250.0000 mg | ORAL_TABLET | Freq: Every day | ORAL | 0 refills | Status: DC

## 2018-05-10 NOTE — Telephone Encounter (Signed)
[name] called me yesterday and left me a message about giving me the results from my lab test and medical recommendations. If she could call me back at [phone]. Thank you.

## 2018-05-10 NOTE — Progress Notes (Signed)
[facility] called stated that Sporanox was contraindicated with triazolam that the patient takes, and wants to know if we can prescribed an alternative. Rx for lamisil sent over to pharmacy

## 2018-06-18 ENCOUNTER — Encounter: Payer: Self-pay | Admitting: Sports Medicine

## 2018-06-18 ENCOUNTER — Ambulatory Visit: Payer: Medicare Other | Admitting: Sports Medicine

## 2018-06-18 DIAGNOSIS — M79674 Pain in right toe(s): Secondary | ICD-10-CM

## 2018-06-18 DIAGNOSIS — B351 Tinea unguium: Secondary | ICD-10-CM | POA: Diagnosis not present

## 2018-06-18 NOTE — Progress Notes (Signed)
Subjective: Desiree Anderson is a 62 y.o. female patient seen today in office for follow up evaluation of nail fungus on Lamisil, started 05-10-18. Patient states that she is doing well with no adverse reaction. Patient has no other pedal complaints at this time.   Patient Active Problem List   Diagnosis Date Noted  . Insomnia 03/25/2018  . Situational anxiety 03/25/2018  . Encounter for Medicare annual wellness exam 02/19/2017  . Tobacco chew use 05/15/2016  . Malignant neoplasm of upper-outer quadrant of right breast in female, estrogen receptor positive ([place]) 04/21/2016  . Hypothyroidism 04/18/2016  . Obesity (BMI 30.0-34.9) 10/11/2015  . Medication management 09/24/2014  . Cystocele, midline 03/23/2014  . Essential hypertension 12/10/2013  . Mixed hyperlipidemia 12/10/2013  . Prediabetes 12/10/2013  . Vitamin D deficiency 12/10/2013  . DJD  12/10/2013  . DDD, lumbar 12/10/2013  . Fibromyalgia Syndrome 12/10/2013    Current Outpatient Medications on File Prior to Visit  Medication Sig Dispense Refill  . ALPRAZolam (XANAX) 0.5 MG tablet Take 1/2 to 1 tablet 2 to 3 x / day ONLY if needed for Anxiety Attacks and please try to limit to 5 days /week to avoid addiction (Patient taking differently: Take 0.25 mg by mouth daily. for Anxiety Attacks and please try to limit to 5 days /week to avoid addiction) 90 tablet 0  . cetirizine (ZYRTEC) 10 MG tablet Take 10 mg by mouth daily.     . Cholecalciferol (VITAMIN D-3) 5000 UNITS TABS Take 5,000 Units by mouth 2 (two) times daily.     . fluticasone (FLONASE) 50 MCG/ACT nasal spray 2 SPRAY EACH NOSTRIL TWICE DAILY AS NEEDED FOR ALLERGIES    . hyoscyamine (LEVSIN SL) 0.125 MG SL tablet DISSOLVE 1 TABLET IN MOUTH EVERY 6 HOURS AS NEEDED 60 tablet 3  . levothyroxine (SYNTHROID, LEVOTHROID) 75 MCG tablet TAKE 1 TABLET BY MOUTH ONCE DAILY BEFORE BREAKFAST 90 tablet 1  . Multiple Vitamins-Minerals (GERITABS PO) Take 1 tablet by mouth daily. Reported  on 06/08/2016    . potassium chloride SA (K-DUR,KLOR-CON) 20 MEQ tablet TAKE 1 TABLET BY MOUTH TWICE DAILY 180 tablet 1  . ranitidine (ZANTAC) 300 MG tablet TAKE ONE TABLET BY MOUTH TWICE DAILY 180 tablet 3  . rosuvastatin (CRESTOR) 40 MG tablet TAKE ONE HALF TABLET BY MOUTH ONCE IN EVENING 45 tablet 1  . tamoxifen (NOLVADEX) 20 MG tablet Take 1 tablet (20 mg total) by mouth daily. 90 tablet 4  . terbinafine (LAMISIL) 250 MG tablet Take 1 tablet (250 mg total) by mouth daily. 90 tablet 0  . triazolam (HALCION) 0.25 MG tablet Take 1/2-1 tablet ONLY if needed for sleep & please try to limit to 5 days /week to avoid addiction 20 tablet 0  . gabapentin (NEURONTIN) 600 MG tablet Take 1/2 to 1 tablet 2 to 3 x / day as need to control pain 270 tablet 1   No current facility-administered medications on file prior to visit.     Allergies  Allergen Reactions  . Iodinated Diagnostic Agents Other (See Comments)    Unknown   . Lipitor [Atorvastatin] Other (See Comments)    Muscle and legs aches  . Shellfish Allergy Diarrhea and Nausea And Vomiting  . Betadine [Povidone Iodine] Rash    Objective: Physical Exam  General: Well developed, nourished, no acute distress, awake, alert and oriented x 3  Vascular: Dorsalis pedis artery 2/4 bilateral, Posterior tibial artery 2/4 bilateral, skin temperature warm to warm proximal to distal bilateral lower extremities,  no varicosities, pedal hair present bilateral.  Neurological: Gross sensation present via light touch bilateral.   Dermatological: Skin is warm, dry, and supple bilateral, Right 1st toenail is short thick, and discolored with trauma lines and mild subungal debris and early clearance noted at proximal nail bed, no webspace macerations present bilateral, no open lesions present bilateral, no callus/corns/hyperkeratotic tissue present bilateral. No signs of infection bilateral.  Musculoskeletal: No symptomatic boney deformities noted bilateral.  Muscular strength within normal limits without painon range of motion. No pain with calf compression bilateral.  Assessment and Plan:  Problem List Items Addressed This Visit    None    Visit Diagnoses    Nail fungus    -  Primary   Relevant Orders   Hepatic Function Panel   Pain of right great toe          -Examined patient -Cont with Lamisil; a new set of LFTs were ordered; will call patient to stop medication if abnormal  -Advised good hygiene habits and educated patient on proper foot care to prevent re-infection -Patient to return in 6 weeks for follow up evaluation or sooner if symptoms worsen.  Landis Martins, DPM

## 2018-06-27 NOTE — Patient Instructions (Signed)

## 2018-06-27 NOTE — Progress Notes (Signed)
This very nice 62 y.o. MWF presents for 6 month follow up with HTN, HLD, Pre-Diabetes and Vitamin D Deficiency. Patient is followed by Dr Jana Hakim for Rt Breast Ca s/p Mastectomy (04/2016) tx'd Radiotx w/o Chemo and on tamoxifen x 5 yrs.  She a has GERD controlled with diet and meds.      Patient has been on SS Disability since 2005 for Fibromyalgia & Chronic Pain Syndrome from DJD/DDD and Interstitial cystitis. She has tapered off of chronic Opioids.       Patient is treated for HTN (2004)  & BP has been controlled at home. Today's BP is at goal - 136/80. Patient has had no complaints of any cardiac type chest pain, palpitations, dyspnea / orthopnea / PND, dizziness, claudication, or dependent edema.     Hyperlipidemia is not controlled with diet as she has discontinued her Crestor. Last Lipids were not at goal:  Lab Results  Component Value Date   CHOL 243 (H) 06/28/2018   HDL 61 06/28/2018   LDLCALC 150 (H) 06/28/2018   TRIG 183 (H) 06/28/2018   CHOLHDL 4.0 06/28/2018      Also, the patient has history of PreDiabetes (A1c 6.1%/2011) and has had no symptoms of reactive hypoglycemia, diabetic polys, paresthesias or visual blurring.  Last A1c was near goal:  Lab Results  Component Value Date   HGBA1C 5.7 (H) 06/28/2018      Patient has been on Thyroid Replacement since 2002.      Further, the patient also has history of Vitamin D Deficiency ("11"/2008)  and supplements vitamin D without any suspected side-effects. Last vitamin D was at goal:   Lab Results  Component Value Date   VD25OH 63 06/28/2018   Current Outpatient Medications on File Prior to Visit  Medication Sig  . ALPRAZolam (XANAX) 0.5 MG tablet Take 1/2 to 1 tablet 2 to 3 x / day ONLY if needed for Anxiety Attacks and please try to limit to 5 days /week to avoid addiction (Patient taking differently: Take 0.25 mg by mouth daily. for Anxiety Attacks and please try to limit to 5 days /week to avoid addiction)  .  cetirizine (ZYRTEC) 10 MG tablet Take 10 mg by mouth daily.   . Cholecalciferol (VITAMIN D-3) 5000 UNITS TABS Take 5,000 Units by mouth 2 (two) times daily.   . fluticasone (FLONASE) 50 MCG/ACT nasal spray 2 SPRAY EACH NOSTRIL TWICE DAILY AS NEEDED FOR ALLERGIES  . hyoscyamine (LEVSIN SL) 0.125 MG SL tablet DISSOLVE 1 TABLET IN MOUTH EVERY 6 HOURS AS NEEDED  . levothyroxine (SYNTHROID, LEVOTHROID) 75 MCG tablet TAKE 1 TABLET BY MOUTH ONCE DAILY BEFORE BREAKFAST  . Multiple Vitamins-Minerals (GERITABS PO) Take 1 tablet by mouth daily. Reported on 06/08/2016  . potassium chloride SA (K-DUR,KLOR-CON) 20 MEQ tablet TAKE 1 TABLET BY MOUTH TWICE DAILY  . ranitidine (ZANTAC) 300 MG tablet TAKE ONE TABLET BY MOUTH TWICE DAILY  . rosuvastatin (CRESTOR) 40 MG tablet TAKE ONE HALF TABLET BY MOUTH ONCE IN EVENING  . tamoxifen (NOLVADEX) 20 MG tablet Take 1 tablet (20 mg total) by mouth daily.  Marland Kitchen terbinafine (LAMISIL) 250 MG tablet Take 1 tablet (250 mg total) by mouth daily.  . triazolam (HALCION) 0.25 MG tablet Take 1/2-1 tablet ONLY if needed for sleep & please try to limit to 5 days /week to avoid addiction  . gabapentin (NEURONTIN) 600 MG tablet Take 1/2 to 1 tablet 2 to 3 x / day as need to control  pain   No current facility-administered medications on file prior to visit.    Allergies  Allergen Reactions  . Iodinated Diagnostic Agents Other (See Comments)    Unknown   . Lipitor [Atorvastatin] Other (See Comments)    Muscle and legs aches  . Shellfish Allergy Diarrhea and Nausea And Vomiting  . Betadine [Povidone Iodine] Rash   PMHx:   Past Medical History:  Diagnosis Date  . Anxiety   . Breast cancer ([place])   . Complication of anesthesia    hypotension  . DDD (degenerative disc disease), lumbar   . Erosion of vaginal mesh ([place])   . Fibromyalgia   . GERD (gastroesophageal reflux disease)   . History of gastric ulcer   . History of radiation therapy 07/06/16- 08/22/16   Right Breast  .  Hypertension   . Hypothyroidism   . Personal history of radiation therapy   . Rectocele    Immunization History  Administered Date(s) Administered  . Influenza Inj Mdck Quad With Preservative 09/04/2017  . Influenza Split 09/18/2013, 09/25/2014  . Influenza, Seasonal, Injecte, Preservative Fre 10/11/2015  . Influenza,inj,quad, With Preservative 08/28/2016  . Influenza-Unspecified 08/27/2016  . Pneumococcal-Unspecified 11/27/2000  . Tdap 03/16/2011   Past Surgical History:  Procedure Laterality Date  . BLADDER TACK  2003  . BREAST BIOPSY Right 04/18/2016  . BREAST LUMPECTOMY Right 05/18/2016  . Breast mass removal Left 1985   Benign  . CARDIOVASCULAR STRESS TEST  07-02-2013  DR DALTON MCLEAN   LOW RISK NUCLEAR STUDY WITH A SMALL, MILD APICAL SEPTAL FIXED PERFUSION DEFECT .  GIVEN NORMAL WALL FUNCTION , SUSPECT REPRESENTS ATTENUATION/  EF 74%/  NO ISCHEMIA  . CATARACT EXTRACTION W/ INTRAOCULAR LENS IMPLANT Left 01/2014  . CATARACT EXTRACTION W/ INTRAOCULAR LENS IMPLANT Right 12/2017   Dr. Manuella Ghazi  . CHOLECYSTECTOMY N/A 12/10/2017   Procedure: LAPAROSCOPIC CHOLECYSTECTOMY;  Surgeon: Rolm Bookbinder, MD;  Location: [facility];  Service: General;  Laterality: N/A;  . CYSTO/  HYDRODISTENTION/  BLADDER BX/   INSTILLATION THERAPY  08-29-2010  . EXCISION OF MESH N/A 03/23/2014   Procedure: EXCISION OF VAGINAL AND PERI-URETHRAL MESH, EXTRACTION FROM URETHRA TO APEX, insertion of XENFORM in the vaginal vault;  Surgeon: Ailene Rud, MD;  Location: [facility];  Service: Urology;  Laterality: N/A;  . EXCISION VAGINAL CYST N/A 04/06/2014   Procedure: VAGINAL SPECULUM EXAMINATION/POSSIBLE DRAINAGE OF HEMATOMA;  Surgeon: Ailene Rud, MD;  Location[facility];  Service: Urology;  Laterality: N/A;  . INCONTINENCE SURGERY  2009  &  2005  . RADIOACTIVE SEED GUIDED PARTIAL MASTECTOMY WITH AXILLARY SENTINEL LYMPH NODE BIOPSY Right 05/18/2016   Procedure:  RADIOACTIVE SEED GUIDED RIGHT BREAST LUMPECTOMY WITH AXILLARY SENTINEL LYMPH NODE BIOPSY;  Surgeon: Rolm Bookbinder, MD;  Location: [place];  Service: General;  Laterality: Right;  RADIOACTIVE SEED GUIDED RIGHT BREAST LUMPECTOMY WITH AXILLARY SENTINEL LYMPH NODE BIOPSY  . TRANSTHORACIC ECHOCARDIOGRAM  06-16-2013   GRADE I DIASTOLIC DYSFUNCTION/  EF 55-60%/  MILD AR/  TRIVIAL MR  &  TR  . VAGINAL HYSTERECTOMY  2000   w/  unilateral salpingoophorectomy  . VEIN BYPASS SURGERY  AGE 69   RIGHT LEG   FHx:    Reviewed / unchanged  SHx:    Reviewed / unchanged   Systems Review:  Constitutional: Denies fever, chills, wt changes, headaches, insomnia, fatigue, night sweats, change in appetite. Eyes: Denies redness, blurred vision, diplopia, discharge, itchy, watery eyes.  ENT: Denies discharge, congestion,  post nasal drip, epistaxis, sore throat, earache, hearing loss, dental pain, tinnitus, vertigo, sinus pain, snoring.  CV: Denies chest pain, palpitations, irregular heartbeat, syncope, dyspnea, diaphoresis, orthopnea, PND, claudication or edema. Respiratory: denies cough, dyspnea, DOE, pleurisy, hoarseness, laryngitis, wheezing.  Gastrointestinal: Denies dysphagia, odynophagia, heartburn, reflux, water brash, abdominal pain or cramps, nausea, vomiting, bloating, diarrhea, constipation, hematemesis, melena, hematochezia  or hemorrhoids. Genitourinary: Denies dysuria, frequency, urgency, nocturia, hesitancy, discharge, hematuria or flank pain. Musculoskeletal: Denies arthralgias, myalgias, stiffness, jt. swelling, pain, limping or strain/sprain.  Skin: Denies pruritus, rash, hives, warts, acne, eczema or change in skin lesion(s). Neuro: No weakness, tremor, incoordination, spasms, paresthesia or pain. Psychiatric: Denies confusion, memory loss or sensory loss. Endo: Denies change in weight, skin or hair change.  Heme/Lymph: No excessive bleeding, bruising or enlarged lymph  nodes.  Physical Exam  BP 136/80   Pulse 68   Temp (!) 97.5 F (36.4 C)   Ht 5' 2.5" (1.588 m)   Wt 181 lb (82.1 kg)   SpO2 99%   BMI 32.58 kg/m   Appears over nourished, well groomed  and in no distress.  Eyes: PERRLA, EOMs, conjunctiva no swelling or erythema. Sinuses: No frontal/maxillary tenderness ENT/Mouth: EAC's clear, TM's nl w/o erythema, bulging. Nares clear w/o erythema, swelling, exudates. Oropharynx clear without erythema or exudates. Oral hygiene is good. Tongue normal, non obstructing. Hearing intact.  Neck: Supple. Thyroid not palpable. Car 2+/2+ without bruits, nodes or JVD. Chest: Respirations nl with BS clear & equal w/o rales, rhonchi, wheezing or stridor.  Cor: Heart sounds normal w/ regular rate and rhythm without sig. murmurs, gallops, clicks or rubs. Peripheral pulses normal and equal  without edema.  Abdomen: Soft & bowel sounds normal. Non-tender w/o guarding, rebound, hernias, masses or organomegaly.  Lymphatics: Unremarkable.  Musculoskeletal: Full ROM all peripheral extremities, joint stability, 5/5 strength and normal gait.  Skin: Warm, dry without exposed rashes, lesions or ecchymosis apparent.  Neuro: Cranial nerves intact, reflexes equal bilaterally. Sensory-motor testing grossly intact. Tendon reflexes grossly intact.  Pysch: Alert & oriented x 3.  Insight and judgement nl & appropriate. No ideations.  Assessment and Plan:  1. Essential hypertension  - Continue medication, monitor blood pressure at home.  - Continue DASH diet.  Reminder to go to the ER if any CP,  SOB, nausea, dizziness, severe HA, changes vision/speech.  - CBC with Differential/Platelet - COMPLETE METABOLIC PANEL WITH GFR - Magnesium - TSH  2. Hyperlipidemia, mixed  - Continue diet/meds, exercise,& lifestyle modifications.  - Continue monitor periodic cholesterol/liver & renal functions   - Lipid panel - TSH  3. Abnormal glucose  - Continue diet, exercise,  lifestyle modifications.  - Monitor appropriate labs.  - Hemoglobin A1c - Insulin, random  4. Vitamin D deficiency  - Continue supplementation.   - VITAMIN D 25 Hydroxyl  5. Prediabetes  - Hemoglobin A1c - Insulin, random  6. Hypothyroidism  - TSH  7. History of breast cancer  - CBC with Differential/Platelet - COMPLETE METABOLIC PANEL WITH GFR - Magnesium - Lipid panel - TSH - Hemoglobin A1c - Insulin, random - VITAMIN D 25 Hydroxyl         Discussed  regular exercise, BP monitoring, weight control to achieve/maintain BMI less than 25 and discussed med and SE's. Recommended labs to assess and monitor clinical status with further disposition pending results of labs. Over 30 minutes of exam, counseling, chart review was performed.

## 2018-06-28 ENCOUNTER — Ambulatory Visit: Payer: Medicare Other | Admitting: Internal Medicine

## 2018-06-28 ENCOUNTER — Encounter: Payer: Self-pay | Admitting: Internal Medicine

## 2018-06-28 ENCOUNTER — Ambulatory Visit: Payer: Self-pay | Admitting: Internal Medicine

## 2018-06-28 VITALS — BP 136/80 | HR 68 | Temp 97.5°F | Ht 62.5 in | Wt 181.0 lb

## 2018-06-28 DIAGNOSIS — I10 Essential (primary) hypertension: Secondary | ICD-10-CM | POA: Diagnosis not present

## 2018-06-28 DIAGNOSIS — E039 Hypothyroidism, unspecified: Secondary | ICD-10-CM | POA: Diagnosis not present

## 2018-06-28 DIAGNOSIS — Z853 Personal history of malignant neoplasm of breast: Secondary | ICD-10-CM

## 2018-06-28 DIAGNOSIS — R7303 Prediabetes: Secondary | ICD-10-CM | POA: Diagnosis not present

## 2018-06-28 DIAGNOSIS — R7309 Other abnormal glucose: Secondary | ICD-10-CM

## 2018-06-28 DIAGNOSIS — E559 Vitamin D deficiency, unspecified: Secondary | ICD-10-CM | POA: Diagnosis not present

## 2018-06-28 DIAGNOSIS — E782 Mixed hyperlipidemia: Secondary | ICD-10-CM | POA: Diagnosis not present

## 2018-06-29 ENCOUNTER — Other Ambulatory Visit: Payer: Self-pay | Admitting: Internal Medicine

## 2018-06-29 MED ORDER — EZETIMIBE 10 MG PO TABS: ORAL_TABLET | ORAL | 1 refills | Status: DC

## 2018-06-30 ENCOUNTER — Encounter: Payer: Self-pay | Admitting: Internal Medicine

## 2018-07-01 ENCOUNTER — Other Ambulatory Visit: Payer: Self-pay | Admitting: Internal Medicine

## 2018-07-01 DIAGNOSIS — E039 Hypothyroidism, unspecified: Secondary | ICD-10-CM

## 2018-07-01 LAB — COMPLETE METABOLIC PANEL WITH GFR
AG Ratio: 1.9 (calc) (ref 1.0–2.5)
ALBUMIN MSPROF: 4.1 g/dL (ref 3.6–5.1)
ALT: 38 U/L — ABNORMAL HIGH (ref 6–29)
AST: 38 U/L — ABNORMAL HIGH (ref 10–35)
Alkaline phosphatase (APISO): 62 U/L (ref 33–130)
BILIRUBIN TOTAL: 0.4 mg/dL (ref 0.2–1.2)
BUN: 9 mg/dL (ref 7–25)
CHLORIDE: 104 mmol/L (ref 98–110)
CO2: 29 mmol/L (ref 20–32)
Calcium: 9.4 mg/dL (ref 8.6–10.4)
Creat: 0.64 mg/dL (ref 0.50–0.99)
GFR, EST AFRICAN AMERICAN: 112 mL/min/{1.73_m2} (ref >=60)
GFR, Est Non African American: 97 mL/min/{1.73_m2} (ref >=60)
GLUCOSE: 93 mg/dL (ref 65–99)
Globulin: 2.2 g/dL (calc) (ref 1.9–3.7)
Potassium: 4.6 mmol/L (ref 3.5–5.3)
SODIUM: 142 mmol/L (ref 135–146)
TOTAL PROTEIN: 6.3 g/dL (ref 6.1–8.1)

## 2018-07-01 LAB — CBC WITH DIFFERENTIAL/PLATELET
BASOS PCT: 0.6 %
Basophils Absolute: 54 cells/uL (ref 0–200)
Eosinophils Absolute: 90 cells/uL (ref 15–500)
Eosinophils Relative: 1 %
HCT: 39.1 % (ref 35.0–45.0)
Hemoglobin: 13.1 g/dL (ref 11.7–15.5)
Lymphs Abs: 4608 cells/uL — ABNORMAL HIGH (ref 850–3900)
MCH: 27.8 pg (ref 27.0–33.0)
MCHC: 33.5 g/dL (ref 32.0–36.0)
MCV: 82.8 fL (ref 80.0–100.0)
MPV: 10.1 fL (ref 7.5–12.5)
Monocytes Relative: 5.8 %
NEUTROS PCT: 41.4 %
Neutro Abs: 3726 cells/uL (ref 1500–7800)
PLATELETS: 280 10*3/uL (ref 140–400)
RBC: 4.72 10*6/uL (ref 3.80–5.10)
RDW: 13.3 % (ref 11.0–15.0)
TOTAL LYMPHOCYTE: 51.2 %
WBC: 9 10*3/uL (ref 3.8–10.8)
WBCMIX: 522 {cells}/uL (ref 200–950)

## 2018-07-01 LAB — MAGNESIUM: Magnesium: 2 mg/dL (ref 1.5–2.5)

## 2018-07-01 LAB — HEMOGLOBIN A1C
EAG (MMOL/L): 6.5 (calc)
HEMOGLOBIN A1C: 5.7 %{Hb} — AB (ref <5.7)
MEAN PLASMA GLUCOSE: 117 (calc)

## 2018-07-01 LAB — LIPID PANEL
Cholesterol: 243 mg/dL — ABNORMAL HIGH (ref <200)
HDL: 61 mg/dL (ref >50)
LDL Cholesterol (Calc): 150 mg/dL (calc) — ABNORMAL HIGH
NON-HDL CHOLESTEROL (CALC): 182 mg/dL — AB (ref <130)
Total CHOL/HDL Ratio: 4 (calc) (ref <5.0)
Triglycerides: 183 mg/dL — ABNORMAL HIGH (ref <150)

## 2018-07-01 LAB — VITAMIN D 25 HYDROXY (VIT D DEFICIENCY, FRACTURES): VIT D 25 HYDROXY: 63 ng/mL (ref 30–100)

## 2018-07-01 LAB — INSULIN, RANDOM: INSULIN: 24.3 u[IU]/mL — AB (ref 2.0–19.6)

## 2018-07-01 LAB — TSH: TSH: 13.34 mIU/L — ABNORMAL HIGH (ref 0.40–4.50)

## 2018-07-01 MED ORDER — LEVOTHYROXINE SODIUM 100 MCG PO TABS: ORAL_TABLET | ORAL | 1 refills | Status: DC

## 2018-07-03 ENCOUNTER — Inpatient Hospital Stay: Payer: Medicare Other | Attending: Oncology

## 2018-07-03 ENCOUNTER — Inpatient Hospital Stay: Payer: Medicare Other | Admitting: Adult Health

## 2018-07-03 ENCOUNTER — Telehealth: Payer: Self-pay | Admitting: Adult Health

## 2018-07-03 ENCOUNTER — Ambulatory Visit: Payer: Medicare Other | Admitting: Oncology

## 2018-07-03 ENCOUNTER — Other Ambulatory Visit: Payer: Medicare Other

## 2018-07-03 VITALS — BP 151/78 | HR 89 | Temp 98.0°F | Resp 18 | Ht 62.5 in | Wt 179.0 lb

## 2018-07-03 DIAGNOSIS — Z170 Estrogen receptor positive status [ER+]: Secondary | ICD-10-CM

## 2018-07-03 DIAGNOSIS — Z801 Family history of malignant neoplasm of trachea, bronchus and lung: Secondary | ICD-10-CM | POA: Insufficient documentation

## 2018-07-03 DIAGNOSIS — Z923 Personal history of irradiation: Secondary | ICD-10-CM | POA: Insufficient documentation

## 2018-07-03 DIAGNOSIS — C50411 Malignant neoplasm of upper-outer quadrant of right female breast: Secondary | ICD-10-CM

## 2018-07-03 DIAGNOSIS — Z87891 Personal history of nicotine dependence: Secondary | ICD-10-CM | POA: Insufficient documentation

## 2018-07-03 DIAGNOSIS — Z79899 Other long term (current) drug therapy: Secondary | ICD-10-CM | POA: Insufficient documentation

## 2018-07-03 DIAGNOSIS — Z79811 Long term (current) use of aromatase inhibitors: Secondary | ICD-10-CM | POA: Diagnosis not present

## 2018-07-03 NOTE — Telephone Encounter (Signed)
Gave patient avs and calendar.   °

## 2018-07-03 NOTE — Patient Instructions (Signed)
Bone Health Bones protect organs, store calcium, and anchor muscles. Good health habits, such as eating nutritious foods and exercising regularly, are important for maintaining healthy bones. They can also help to prevent a condition that causes bones to lose density and become weak and brittle (osteoporosis). Why is bone mass important? Bone mass refers to the amount of bone tissue that you have. The higher your bone mass, the stronger your bones. An important step toward having healthy bones throughout life is to have strong and dense bones during childhood. A young adult who has a high bone mass is more likely to have a high bone mass later in life. Bone mass at its greatest it is called peak bone mass. A large decline in bone mass occurs in older adults. In women, it occurs about the time of menopause. During this time, it is important to practice good health habits, because if more bone is lost than what is replaced, the bones will become less healthy and more likely to break (fracture). If you find that you have a low bone mass, you may be able to prevent osteoporosis or further bone loss by changing your diet and lifestyle. How can I find out if my bone mass is low? Bone mass can be measured with an X-ray test that is called a bone mineral density (BMD) test. This test is recommended for all women who are age 65 or older. It may also be recommended for men who are age 70 or older, or for people who are more likely to develop osteoporosis due to:  Having bones that break easily.  Having a long-term disease that weakens bones, such as kidney disease or rheumatoid arthritis.  Having menopause earlier than normal.  Taking medicine that weakens bones, such as steroids, thyroid hormones, or hormone treatment for breast cancer or prostate cancer.  Smoking.  Drinking three or more alcoholic drinks each day.  What are the nutritional recommendations for healthy bones? To have healthy bones, you  need to get enough of the right minerals and vitamins. Most nutrition experts recommend getting these nutrients from the foods that you eat. Nutritional recommendations vary from person to person. Ask your health care provider what is healthy for you. Here are some general guidelines. Calcium Recommendations Calcium is the most important (essential) mineral for bone health. Most people can get enough calcium from their diet, but supplements may be recommended for people who are at risk for osteoporosis. Good sources of calcium include:  Dairy products, such as low-fat or nonfat milk, cheese, and yogurt.  Dark green leafy vegetables, such as bok choy and broccoli.  Calcium-fortified foods, such as orange juice, cereal, bread, soy beverages, and tofu products.  Nuts, such as almonds.  Follow these recommended amounts for daily calcium intake:  Children, age 1?3: 700 mg.  Children, age 4?8: 1,000 mg.  Children, age 9?13: 1,300 mg.  Teens, age 14?18: 1,300 mg.  Adults, age 19?50: 1,000 mg.  Adults, age 51?70: ? Men: 1,000 mg. ? Women: 1,200 mg.  Adults, age 71 or older: 1,200 mg.  Pregnant and breastfeeding females: ? Teens: 1,300 mg. ? Adults: 1,000 mg.  Vitamin D Recommendations Vitamin D is the most essential vitamin for bone health. It helps the body to absorb calcium. Sunlight stimulates the skin to make vitamin D, so be sure to get enough sunlight. If you live in a cold climate or you do not get outside often, your health care provider may recommend that you take vitamin   D supplements. Good sources of vitamin D in your diet include:  Egg yolks.  Saltwater fish.  Milk and cereal fortified with vitamin D.  Follow these recommended amounts for daily vitamin D intake:  Children and teens, age 1?18: 600 international units.  Adults, age 50 or younger: 400-800 international units.  Adults, age 51 or older: 800-1,000 international units.  Other Nutrients Other nutrients  for bone health include:  Phosphorus. This mineral is found in meat, poultry, dairy foods, nuts, and legumes. The recommended daily intake for adult men and adult women is 700 mg.  Magnesium. This mineral is found in seeds, nuts, dark green vegetables, and legumes. The recommended daily intake for adult men is 400?420 mg. For adult women, it is 310?320 mg.  Vitamin K. This vitamin is found in green leafy vegetables. The recommended daily intake is 120 mg for adult men and 90 mg for adult women.  What type of physical activity is best for building and maintaining healthy bones? Weight-bearing and strength-building activities are important for building and maintaining peak bone mass. Weight-bearing activities cause muscles and bones to work against gravity. Strength-building activities increases muscle strength that supports bones. Weight-bearing and muscle-building activities include:  Walking and hiking.  Jogging and running.  Dancing.  Gym exercises.  Lifting weights.  Tennis and racquetball.  Climbing stairs.  Aerobics.  Adults should get at least 30 minutes of moderate physical activity on most days. Children should get at least 60 minutes of moderate physical activity on most days. Ask your health care provide what type of exercise is best for you. Where can I find more information? For more information, check out the following websites:  National Osteoporosis Foundation: http://nof.org/learn/basics  National Institutes of Health: http://www.niams.nih.gov/Health_Info/Bone/Bone_Health/bone_health_for_life.asp  This information is not intended to replace advice given to you by your health care provider. Make sure you discuss any questions you have with your health care provider. Document Released: 02/03/2004 Document Revised: 06/02/2016 Document Reviewed: 11/18/2014 [employer] Interactive Patient Education  2018 [employer]  

## 2018-07-03 NOTE — Progress Notes (Signed)
Desiree Anderson  [phone] Fax:([phone]) [phone]     ID: Desiree Anderson DOB: 10/17/1956  MR#: [id number]  UVO#:[id number]  Patient Care Team: Unk Pinto, MD as PCP - General (Internal Medicine) Fay Records, MD as Consulting Physician (Cardiology) [name], MD as Consulting Physician (Orthopedic Surgery) Carolan Clines, MD as Consulting Physician (Urology) Vanessa Kick, MD as Consulting Physician (Obstetrics and Gynecology) Magrinat, Virgie Dad, MD as Consulting Physician (Oncology) Rolm Bookbinder, MD as Consulting Physician (General Surgery) Thea Silversmith, MD as Consulting Physician (Radiation Oncology) PCP: Unk Pinto, MD GYN: OTHER MD:  CHIEF COMPLAINT: Estrogen receptor positive invasive breast cancer  CURRENT TREATMENT: Tamoxifen   BREAST CANCER HISTORY: From the original intake note:  Desiree Anderson had routine screening mammography showing a possible area of distortion in the right breast. On 04/18/2016 she had diagnostic right mammography with tomography and right breast ultrasonography at the [facility]. Breast density was category C. In the upper outer quadrant of the right breast there was a persistent area of architectural distortion which was not palpable on exam. Ultrasonography confirmed a 1.0 cm irregular mass at the 11:00 position 3 cm from the nipple. There was no right axillary lymphadenopathy by ultrasonography.  Biopsy of the right breast mass in question was obtained the same day and showed (S AA [phone]) an invasive ductal carcinoma, grade 2, estrogen and progesterone receptor both 100% positive with strong staining intensity, with an MIB-1 of 5%, and no HER-2 amplification, the signals ratio being 1.57, the number per cell 3.85.  Her subsequent history is as detailed below  INTERVAL HISTORY: Desiree Anderson is here today for follow up of her estrogen positive bresat cancer.  She is doing well today.  She is taking Tamoxifen  daily and is tolerating it well.  She is up to date with PCP follow up and evaluation with gynecology.  She is not exercising.     REVIEW OF SYSTEMS: Since her last appointment, Desiree Anderson underwent bilateral mammogram in 03/2018 that was normal.  She is doing well today.  She denies any new issues such as fevers, chill, nausea, vomiting, constipation, diarrhea, headaches, vision change, or any other issues.  A detailed ROS was conducted and was non contributory.    PAST MEDICAL HISTORY: Past Medical History:  Diagnosis Date  . Anxiety   . Breast cancer ([place])   . Complication of anesthesia    hypotension  . DDD (degenerative disc disease), lumbar   . Erosion of vaginal mesh ([place])   . Fibromyalgia   . GERD (gastroesophageal reflux disease)   . History of gastric ulcer   . History of radiation therapy 07/06/16- 08/22/16   Right Breast  . Hypertension   . Hypothyroidism   . Personal history of radiation therapy   . Rectocele     PAST SURGICAL HISTORY: Past Surgical History:  Procedure Laterality Date  . BLADDER TACK  2003  . BREAST BIOPSY Right 04/18/2016  . BREAST LUMPECTOMY Right 05/18/2016  . Breast mass removal Left 1985   Benign  . CARDIOVASCULAR STRESS TEST  07-02-2013  DR DALTON MCLEAN   LOW RISK NUCLEAR STUDY WITH A SMALL, MILD APICAL SEPTAL FIXED PERFUSION DEFECT .  GIVEN NORMAL WALL FUNCTION , SUSPECT REPRESENTS ATTENUATION/  EF 74%/  NO ISCHEMIA  . CATARACT EXTRACTION W/ INTRAOCULAR LENS IMPLANT Left 01/2014  . CATARACT EXTRACTION W/ INTRAOCULAR LENS IMPLANT Right 12/2017   Dr. Manuella Ghazi  . CHOLECYSTECTOMY N/A 12/10/2017   Procedure: LAPAROSCOPIC CHOLECYSTECTOMY;  Surgeon: Rolm Bookbinder,  MD;  Location: [facility];  Service: General;  Laterality: N/A;  . CYSTO/  HYDRODISTENTION/  BLADDER BX/   INSTILLATION THERAPY  08-29-2010  . EXCISION OF MESH N/A 03/23/2014   Procedure: EXCISION OF VAGINAL AND PERI-URETHRAL MESH, EXTRACTION FROM URETHRA TO APEX, insertion of XENFORM in the  vaginal vault;  Surgeon: Ailene Rud, MD;  Location: [facility];  Service: Urology;  Laterality: N/A;  . EXCISION VAGINAL CYST N/A 04/06/2014   Procedure: VAGINAL SPECULUM EXAMINATION/POSSIBLE DRAINAGE OF HEMATOMA;  Surgeon: Ailene Rud, MD;  Location: [facility];  Service: Urology;  Laterality: N/A;  . INCONTINENCE SURGERY  2009  &  2005  . RADIOACTIVE SEED GUIDED PARTIAL MASTECTOMY WITH AXILLARY SENTINEL LYMPH NODE BIOPSY Right 05/18/2016   Procedure: RADIOACTIVE SEED GUIDED RIGHT BREAST LUMPECTOMY WITH AXILLARY SENTINEL LYMPH NODE BIOPSY;  Surgeon: Rolm Bookbinder, MD;  Location: [facility];  Service: General;  Laterality: Right;  RADIOACTIVE SEED GUIDED RIGHT BREAST LUMPECTOMY WITH AXILLARY SENTINEL LYMPH NODE BIOPSY  . TRANSTHORACIC ECHOCARDIOGRAM  06-16-2013   GRADE I DIASTOLIC DYSFUNCTION/  EF 55-60%/  MILD AR/  TRIVIAL MR  &  TR  . VAGINAL HYSTERECTOMY  2000   w/  unilateral salpingoophorectomy  . VEIN BYPASS SURGERY  AGE 77   RIGHT LEG    FAMILY HISTORY Family History  Problem Relation Age of Onset  . Diabetes Mother   . Diverticulitis Mother   . Lung cancer Brother   . Intestinal polyp Sister   [name] father died at the age of 61 from a myocardial infarction. He was not a smoker however. [name] mother died with renal failure at the age of 24. The patient had 4 brothers, 3 sisters. The only cancer in the family was one brother diagnosed with lung cancer at the age of 30. There is no history of breast or ovarian cancer in the family.  GYNECOLOGIC HISTORY:  No LMP recorded. Patient has had an ablation. Menarche age 62, first live birth age 70, the patient is Desiree Anderson P2. She stopped having periods approximately 2003. She took hormone replacement approximately 3 years. She also used birth control pills remotely for approximately 20 years, with no complications.  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 Her husband [name] family business, produce an plants, in [employer], for 17 years. He is now retired. Daughter Dentist lives in [place] and is a homemaker. Son Carlynn Spry Goins lives in [place] and has multiple health problems. The patient has 2 granddaughters. She also has 2 grandchildren who have died. She is a Furniture conservator/restorer.    ADVANCED DIRECTIVES: Not in place   HEALTH MAINTENANCE: Social History   Tobacco Use  . Smoking status: Former Smoker    Years: 20.00    Types: Cigarettes    Last attempt to quit: 11/28/1995    Years since quitting: 22.6  . Smokeless tobacco: Current User    Types: Snuff  . Tobacco comment: dips daily  Substance Use Topics  . Alcohol use: No  . Drug use: No     Colonoscopy: 2007/ [facility]  PAP:  Bone density: 2012/ [facility]  Lipid panel:  Allergies  Allergen Reactions  . Iodinated Diagnostic Agents Other (See Comments)    Unknown   . Lipitor [Atorvastatin] Other (See Comments)    Muscle and legs aches  .  Shellfish Allergy Diarrhea and Nausea And Vomiting  . Betadine [Povidone Iodine] Rash    Current Outpatient Medications  Medication Sig Dispense Refill  . ALPRAZolam (XANAX) 0.5 MG tablet Take 1/2 to 1 tablet 2 to 3 x / day ONLY if needed for Anxiety Attacks and please try to limit to 5 days /week to avoid addiction (Patient taking differently: Take 0.25 mg by mouth daily. for Anxiety Attacks and please try to limit to 5 days /week to avoid addiction) 90 tablet 0  . cetirizine (ZYRTEC) 10 MG tablet Take 10 mg by mouth daily.     . Cholecalciferol (VITAMIN D-3) 5000 UNITS TABS Take 5,000 Units by mouth 2 (two) times daily.     Marland Kitchen ezetimibe (ZETIA) 10 MG tablet Take 1 tablet daily for Cholesterol 90 tablet 1  . fluticasone (FLONASE) 50 MCG/ACT nasal spray 2 SPRAY EACH NOSTRIL TWICE DAILY AS NEEDED FOR ALLERGIES    . hyoscyamine (LEVSIN SL) 0.125 MG SL tablet  DISSOLVE 1 TABLET IN MOUTH EVERY 6 HOURS AS NEEDED 60 tablet 3  . levothyroxine (SYNTHROID) 100 MCG tablet Take 1 tablet on an empty stomach with only water for 30-60 minutes and no antacids for 4 hours 90 tablet 1  . Multiple Vitamins-Minerals (GERITABS PO) Take 1 tablet by mouth daily. Reported on 06/08/2016    . potassium chloride SA (K-DUR,KLOR-CON) 20 MEQ tablet TAKE 1 TABLET BY MOUTH TWICE DAILY 180 tablet 1  . ranitidine (ZANTAC) 300 MG tablet TAKE ONE TABLET BY MOUTH TWICE DAILY 180 tablet 3  . rosuvastatin (CRESTOR) 40 MG tablet TAKE ONE HALF TABLET BY MOUTH ONCE IN EVENING 45 tablet 1  . tamoxifen (NOLVADEX) 20 MG tablet Take 1 tablet (20 mg total) by mouth daily. 90 tablet 4  . terbinafine (LAMISIL) 250 MG tablet Take 1 tablet (250 mg total) by mouth daily. 90 tablet 0  . triazolam (HALCION) 0.25 MG tablet Take 1/2-1 tablet ONLY if needed for sleep & please try to limit to 5 days /week to avoid addiction 20 tablet 0  . gabapentin (NEURONTIN) 600 MG tablet Take 1/2 to 1 tablet 2 to 3 x / day as need to control pain 270 tablet 1   No current facility-administered medications for this visit.     OBJECTIVE:   Vitals:   07/03/18 1024  BP: (!) 151/78  Pulse: 89  Resp: 18  Temp: 98 F (36.7 C)     Body mass index is 32.22 kg/m.    ECOG FS:1 - Symptomatic but completely ambulatory GENERAL: Patient is a well appearing female in no acute distress HEENT:  Sclerae anicteric.  Oropharynx clear and moist. No ulcerations or evidence of oropharyngeal candidiasis. Neck is supple.  NODES:  No cervical, supraclavicular, or axillary lymphadenopathy palpated.  BREAST EXAM: right breast s/p lumpectomy, no nodules, masses, skin or nipple changes, left breast without nodules, masses, nipple/skin changes.   LUNGS:  Clear to auscultation bilaterally.  No wheezes or rhonchi. HEART:  Regular rate and rhythm. No murmur appreciated. ABDOMEN:  Soft, nontender.  Positive, normoactive bowel sounds. No  organomegaly palpated. MSK:  No focal spinal tenderness to palpation. Full range of motion bilaterally in the upper extremities. EXTREMITIES:  No peripheral edema.   SKIN:  Clear with no obvious rashes or skin changes. No nail dyscrasia. NEURO:  Nonfocal. Well oriented.  Appropriate affect.    LAB RESULTS:  CMP     Component Value Date/Time   NA 142 06/28/2018 1123   NA 144 07/03/2017  1241   K 4.6 06/28/2018 1123   K 3.8 07/03/2017 1241   CL 104 06/28/2018 1123   CO2 29 06/28/2018 1123   CO2 27 07/03/2017 1241   GLUCOSE 93 06/28/2018 1123   GLUCOSE 142 (H) 07/03/2017 1241   BUN 9 06/28/2018 1123   BUN 11.2 07/03/2017 1241   CREATININE 0.64 06/28/2018 1123   CREATININE 0.9 07/03/2017 1241   CALCIUM 9.4 06/28/2018 1123   CALCIUM 9.2 07/03/2017 1241   PROT 6.3 06/28/2018 1123   PROT 6.7 07/03/2017 1241   ALBUMIN 3.4 (L) 12/10/2017 0749   ALBUMIN 3.7 07/03/2017 1241   AST 38 (H) 06/28/2018 1123   AST 22 07/03/2017 1241   ALT 38 (H) 06/28/2018 1123   ALT 37 07/03/2017 1241   ALKPHOS 49 12/10/2017 0749   ALKPHOS 55 07/03/2017 1241   BILITOT 0.4 06/28/2018 1123   BILITOT 0.29 07/03/2017 1241   GFRNONAA 97 06/28/2018 1123   GFRAA 112 06/28/2018 1123    INo results found for: SPEP, UPEP  Lab Results  Component Value Date   WBC 9.0 06/28/2018   NEUTROABS 3,726 06/28/2018   HGB 13.1 06/28/2018   HCT 39.1 06/28/2018   MCV 82.8 06/28/2018   PLT 280 06/28/2018      Chemistry      Component Value Date/Time   NA 142 06/28/2018 1123   NA 144 07/03/2017 1241   K 4.6 06/28/2018 1123   K 3.8 07/03/2017 1241   CL 104 06/28/2018 1123   CO2 29 06/28/2018 1123   CO2 27 07/03/2017 1241   BUN 9 06/28/2018 1123   BUN 11.2 07/03/2017 1241   CREATININE 0.64 06/28/2018 1123   CREATININE 0.9 07/03/2017 1241      Component Value Date/Time   CALCIUM 9.4 06/28/2018 1123   CALCIUM 9.2 07/03/2017 1241   ALKPHOS 49 12/10/2017 0749   ALKPHOS 55 07/03/2017 1241   AST 38 (H)  06/28/2018 1123   AST 22 07/03/2017 1241   ALT 38 (H) 06/28/2018 1123   ALT 37 07/03/2017 1241   BILITOT 0.4 06/28/2018 1123   BILITOT 0.29 07/03/2017 1241       No results found for: LABCA2  No components found for: LABCA125  No results for input(s): INR in the last 168 hours.  Urinalysis    Component Value Date/Time   COLORURINE YELLOW 01/15/2018 1328   APPEARANCEUR CLOUDY (A) 01/15/2018 1328   LABSPEC 1.013 01/15/2018 1328   PHURINE 6.0 01/15/2018 1328   GLUCOSEU NEGATIVE 01/15/2018 1328   HGBUR NEGATIVE 01/15/2018 [place] 11/15/2016 1436   KETONESUR NEGATIVE 01/15/2018 1328   PROTEINUR NEGATIVE 01/15/2018 1328   NITRITE NEGATIVE 01/15/2018 1328   LEUKOCYTESUR NEGATIVE 01/15/2018 1328      ELIGIBLE FOR AVAILABLE RESEARCH PROTOCOL: no  STUDIES:   ASSESSMENT: 62 y.o. [place] woman status post right breast upper outer quadrant biopsy 04/18/2016 for a clinical T1b N0, stage IA invasive ductal carcinoma, grade 2, estrogen and progesterone receptor strongly positive, HER-2 nonamplified, with an MIB-1 of 5%.   (1) Status post right lumpectomy and sentinel lymph node sampling 05/18/2016 for a pT1c pN0, stage IA invasive ductal carcinoma, grade 1, with negative margins, Repeat HER-2 again negative  (2) Oncotype DX score of 12 predicts a 10 year risk of recurrence outside the breast of 8% the patient's only systemic therapy is tamoxifen for 5 years. It also protects no benefit from adjuvant chemotherapy. He  (3) adjuvant radiation 07/06/2016-08/22/2016  (4) started tamoxifen 09/27/2016  PLAN: Meila is doing well today.  She has no clinical or radiographic sign of recurrence.  She is tolerating Tamoxifen and will continue this.  She will repeat her mammogram in 03/2019.    We reviewed bone health today.  We reviewed recommendations for exercise, healthy diet.  I recommended she stay up to date with her cancer screenings/health maintenance: colon  cancer, skin cancer, GYN cancer.    She will return in one year for f/u with Dr. Jana Hakim.  We can follow along labs that Dr. Melford Aase is doing, since he is checking these every 3-6 months. She knows to call for any problems that may develop before that visit.  A total of (30) minutes of face-to-face time was spent with this patient with greater than 50% of that time in counseling and care-coordination.  Scot Dock, NP   07/03/2018 10:48 AM Medical Oncology and Hematology [facility] [address], [place] [address] Tel. [phone]    Fax. [phone]

## 2018-07-04 ENCOUNTER — Encounter: Payer: Self-pay | Admitting: Adult Health

## 2018-07-15 ENCOUNTER — Other Ambulatory Visit: Payer: Self-pay | Admitting: Internal Medicine

## 2018-07-15 MED ORDER — ALPRAZOLAM 0.5 MG PO TABS: ORAL_TABLET | ORAL | 0 refills | Status: DC

## 2018-07-17 DIAGNOSIS — N951 Menopausal and female climacteric states: Secondary | ICD-10-CM | POA: Diagnosis not present

## 2018-07-17 DIAGNOSIS — Z1272 Encounter for screening for malignant neoplasm of vagina: Secondary | ICD-10-CM | POA: Diagnosis not present

## 2018-07-17 DIAGNOSIS — C50911 Malignant neoplasm of unspecified site of right female breast: Secondary | ICD-10-CM | POA: Diagnosis not present

## 2018-07-17 DIAGNOSIS — Z01419 Encounter for gynecological examination (general) (routine) without abnormal findings: Secondary | ICD-10-CM | POA: Diagnosis not present

## 2018-07-19 ENCOUNTER — Ambulatory Visit: Payer: Self-pay | Admitting: Internal Medicine

## 2018-07-30 ENCOUNTER — Ambulatory Visit: Payer: Medicare Other | Admitting: Sports Medicine

## 2018-07-30 ENCOUNTER — Encounter: Payer: Self-pay | Admitting: Sports Medicine

## 2018-07-30 DIAGNOSIS — L601 Onycholysis: Secondary | ICD-10-CM

## 2018-07-30 DIAGNOSIS — B351 Tinea unguium: Secondary | ICD-10-CM

## 2018-07-30 DIAGNOSIS — M79674 Pain in right toe(s): Secondary | ICD-10-CM | POA: Diagnosis not present

## 2018-07-30 LAB — HEPATIC FUNCTION PANEL
AG Ratio: 2 (calc) (ref 1.0–2.5)
ALBUMIN MSPROF: 3.9 g/dL (ref 3.6–5.1)
ALKALINE PHOSPHATASE (APISO): 54 U/L (ref 33–130)
ALT: 25 U/L (ref 6–29)
AST: 21 U/L (ref 10–35)
BILIRUBIN INDIRECT: 0.2 mg/dL (ref 0.2–1.2)
Bilirubin, Direct: 0.1 mg/dL (ref 0.0–0.2)
Globulin: 2 g/dL (calc) (ref 1.9–3.7)
Total Bilirubin: 0.3 mg/dL (ref 0.2–1.2)
Total Protein: 5.9 g/dL — ABNORMAL LOW (ref 6.1–8.1)

## 2018-07-30 NOTE — Progress Notes (Signed)
Subjective: Desiree Anderson is a 61 y.o. female patient seen today in office for follow up evaluation of nail fungus on Lamisil, started 05-10-18. Patient states that she is doing well with no adverse reaction. Patient has no other pedal complaints at this time.   Review of Systems  All other systems reviewed and are negative.    Patient Active Problem List   Diagnosis Date Noted  . Insomnia 03/25/2018  . Situational anxiety 03/25/2018  . Encounter for Medicare annual wellness exam 02/19/2017  . Tobacco chew use 05/15/2016  . Malignant neoplasm of upper-outer quadrant of right breast in female, estrogen receptor positive ([place]) 04/21/2016  . Hypothyroidism 04/18/2016  . Obesity (BMI 30.0-34.9) 10/11/2015  . Medication management 09/24/2014  . Cystocele, midline 03/23/2014  . Essential hypertension 12/10/2013  . Mixed hyperlipidemia 12/10/2013  . Prediabetes 12/10/2013  . Vitamin D deficiency 12/10/2013  . DJD  12/10/2013  . DDD, lumbar 12/10/2013  . Fibromyalgia Syndrome 12/10/2013    Current Outpatient Medications on File Prior to Visit  Medication Sig Dispense Refill  . ALPRAZolam (XANAX) 0.5 MG tablet Take 1/2 to 1 tablet 2 to 3 x / day ONLY if needed for Anxiety Attacks and please try to limit to 5 days /week to avoid addiction 90 tablet 0  . cetirizine (ZYRTEC) 10 MG tablet Take 10 mg by mouth daily.     . Cholecalciferol (VITAMIN D-3) 5000 UNITS TABS Take 5,000 Units by mouth 2 (two) times daily.     Marland Kitchen ezetimibe (ZETIA) 10 MG tablet Take 1 tablet daily for Cholesterol 90 tablet 1  . fluticasone (FLONASE) 50 MCG/ACT nasal spray 2 SPRAY EACH NOSTRIL TWICE DAILY AS NEEDED FOR ALLERGIES    . gabapentin (NEURONTIN) 600 MG tablet Take 1/2 to 1 tablet 2 to 3 x / day as need to control pain 270 tablet 1  . hyoscyamine (LEVSIN SL) 0.125 MG SL tablet DISSOLVE 1 TABLET IN MOUTH EVERY 6 HOURS AS NEEDED 60 tablet 3  . levothyroxine (SYNTHROID) 100 MCG tablet Take 1 tablet on an empty  stomach with only water for 30-60 minutes and no antacids for 4 hours 90 tablet 1  . Multiple Vitamins-Minerals (GERITABS PO) Take 1 tablet by mouth daily. Reported on 06/08/2016    . potassium chloride SA (K-DUR,KLOR-CON) 20 MEQ tablet TAKE 1 TABLET BY MOUTH TWICE DAILY 180 tablet 1  . ranitidine (ZANTAC) 300 MG tablet TAKE ONE TABLET BY MOUTH TWICE DAILY 180 tablet 3  . rosuvastatin (CRESTOR) 40 MG tablet TAKE ONE HALF TABLET BY MOUTH ONCE IN EVENING 45 tablet 1  . tamoxifen (NOLVADEX) 20 MG tablet Take 1 tablet (20 mg total) by mouth daily. 90 tablet 4  . terbinafine (LAMISIL) 250 MG tablet Take 1 tablet (250 mg total) by mouth daily. 90 tablet 0  . triazolam (HALCION) 0.25 MG tablet Take 1/2-1 tablet ONLY if needed for sleep & please try to limit to 5 days /week to avoid addiction 20 tablet 0   No current facility-administered medications on file prior to visit.     Allergies  Allergen Reactions  . Iodinated Diagnostic Agents Other (See Comments)    Unknown   . Lipitor [Atorvastatin] Other (See Comments)    Muscle and legs aches  . Shellfish Allergy Diarrhea and Nausea And Vomiting  . Betadine [Povidone Iodine] Rash    Objective: Physical Exam  General: Well developed, nourished, no acute distress, awake, alert and oriented x 3  Vascular: Dorsalis pedis artery 2/4 bilateral, Posterior  tibial artery 2/4 bilateral, skin temperature warm to warm proximal to distal bilateral lower extremities, no varicosities, pedal hair present bilateral.  Neurological: Gross sensation present via light touch bilateral.   Dermatological: Skin is warm, dry, and supple bilateral, Right 1st toenail is short thick, and discolored with trauma lines and mild subungal debris and early clearance noted at proximal nail bed, no detachment of right hallux nail appears well attached and growing out normally at this time, no webspace macerations present bilateral, no open lesions present bilateral, no  callus/corns/hyperkeratotic tissue present bilateral. No signs of infection bilateral.  Musculoskeletal: No symptomatic boney deformities noted bilateral. Muscular strength within normal limits without painon range of motion. No pain with calf compression bilateral.  Assessment and Plan:  Problem List Items Addressed This Visit    None    Visit Diagnoses    Nail fungus    -  Primary   Pain of right great toe       Onycholysis          -Examined patient -Cont with Lamisil; a new set of LFTs were ordered; will call patient to stop medication if abnormal. If in normal range to finish off the course of the medication   -Advised good hygiene habits and educated patient on proper foot care to prevent re-infection -Patient to return in 6-8 weeks for final follow up evaluation after finishing lamisil or sooner if symptoms worsen.  Landis Martins, DPM

## 2018-07-31 ENCOUNTER — Telehealth: Payer: Self-pay | Admitting: *Deleted

## 2018-07-31 NOTE — Telephone Encounter (Signed)
-----   Message from Landis Martins, [place] sent at 07/31/2018  7:25 AM EDT ----- Let patient know that her bloodwork was within normal limits and to continue with her Lamisil medication until she has finished it. No refills are needed at this time.  -Dr. Cannon Kettle

## 2018-07-31 NOTE — Telephone Encounter (Signed)
I informed pt of Dr. Stover's review of results and orders. 

## 2018-08-01 DIAGNOSIS — C50911 Malignant neoplasm of unspecified site of right female breast: Secondary | ICD-10-CM | POA: Diagnosis not present

## 2018-09-19 ENCOUNTER — Other Ambulatory Visit: Payer: Self-pay | Admitting: *Deleted

## 2018-09-19 MED ORDER — TAMOXIFEN CITRATE 20 MG PO TABS: 20.0000 mg | ORAL_TABLET | Freq: Every day | ORAL | 4 refills | Status: DC

## 2018-09-24 ENCOUNTER — Ambulatory Visit: Payer: Medicare Other | Admitting: Sports Medicine

## 2018-09-24 ENCOUNTER — Encounter: Payer: Self-pay | Admitting: Sports Medicine

## 2018-09-24 DIAGNOSIS — B351 Tinea unguium: Secondary | ICD-10-CM

## 2018-09-24 DIAGNOSIS — M79674 Pain in right toe(s): Secondary | ICD-10-CM

## 2018-09-24 NOTE — Progress Notes (Signed)
Subjective: Desiree Anderson is a 62 y.o. female patient seen today in office for follow up evaluation of nail fungus completed Lamisil 08/10/2018.  Patient states that she is doing well with no adverse reaction and thinks that her nail is starting to grow in and look better and is growing and attached versus lifting at this time.  Patient denies redness warmth swelling drainage or any acute symptoms at the right great toenail. Patient has no other pedal complaints at this time.     Patient Active Problem List   Diagnosis Date Noted  . Insomnia 03/25/2018  . Situational anxiety 03/25/2018  . Encounter for Medicare annual wellness exam 02/19/2017  . Tobacco chew use 05/15/2016  . Malignant neoplasm of upper-outer quadrant of right breast in female, estrogen receptor positive ([place]) 04/21/2016  . Hypothyroidism 04/18/2016  . Obesity (BMI 30.0-34.9) 10/11/2015  . Medication management 09/24/2014  . Cystocele, midline 03/23/2014  . Essential hypertension 12/10/2013  . Mixed hyperlipidemia 12/10/2013  . Prediabetes 12/10/2013  . Vitamin D deficiency 12/10/2013  . DJD  12/10/2013  . DDD, lumbar 12/10/2013  . Fibromyalgia Syndrome 12/10/2013    Current Outpatient Medications on File Prior to Visit  Medication Sig Dispense Refill  . ALPRAZolam (XANAX) 0.5 MG tablet Take 1/2 to 1 tablet 2 to 3 x / day ONLY if needed for Anxiety Attacks and please try to limit to 5 days /week to avoid addiction 90 tablet 0  . cetirizine (ZYRTEC) 10 MG tablet Take 10 mg by mouth daily.     . Cholecalciferol (VITAMIN D-3) 5000 UNITS TABS Take 5,000 Units by mouth 2 (two) times daily.     Marland Kitchen ezetimibe (ZETIA) 10 MG tablet Take 1 tablet daily for Cholesterol 90 tablet 1  . fluticasone (FLONASE) 50 MCG/ACT nasal spray 2 SPRAY EACH NOSTRIL TWICE DAILY AS NEEDED FOR ALLERGIES    . gabapentin (NEURONTIN) 600 MG tablet Take 1/2 to 1 tablet 2 to 3 x / day as need to control pain 270 tablet 1  . hyoscyamine (LEVSIN SL)  0.125 MG SL tablet DISSOLVE 1 TABLET IN MOUTH EVERY 6 HOURS AS NEEDED 60 tablet 3  . levothyroxine (SYNTHROID) 100 MCG tablet Take 1 tablet on an empty stomach with only water for 30-60 minutes and no antacids for 4 hours 90 tablet 1  . Multiple Vitamins-Minerals (GERITABS PO) Take 1 tablet by mouth daily. Reported on 06/08/2016    . potassium chloride SA (K-DUR,KLOR-CON) 20 MEQ tablet TAKE 1 TABLET BY MOUTH TWICE DAILY 180 tablet 1  . ranitidine (ZANTAC) 300 MG tablet TAKE ONE TABLET BY MOUTH TWICE DAILY 180 tablet 3  . rosuvastatin (CRESTOR) 40 MG tablet TAKE ONE HALF TABLET BY MOUTH ONCE IN EVENING 45 tablet 1  . tamoxifen (NOLVADEX) 20 MG tablet Take 1 tablet (20 mg total) by mouth daily. 90 tablet 4  . terbinafine (LAMISIL) 250 MG tablet Take 1 tablet (250 mg total) by mouth daily. 90 tablet 0  . triazolam (HALCION) 0.25 MG tablet Take 1/2-1 tablet ONLY if needed for sleep & please try to limit to 5 days /week to avoid addiction 20 tablet 0   No current facility-administered medications on file prior to visit.     Allergies  Allergen Reactions  . Iodinated Diagnostic Agents Other (See Comments)    Unknown   . Lipitor [Atorvastatin] Other (See Comments)    Muscle and legs aches  . Shellfish Allergy Diarrhea and Nausea And Vomiting  . Betadine [Povidone Iodine] Rash  Objective: Physical Exam  General: Well developed, nourished, no acute distress, awake, alert and oriented x 3  Vascular: Dorsalis pedis artery 2/4 bilateral, Posterior tibial artery 2/4 bilateral, skin temperature warm to warm proximal to distal bilateral lower extremities, no varicosities, pedal hair present bilateral.  Neurological: Gross sensation present via light touch bilateral.   Dermatological: Skin is warm, dry, and supple bilateral, Right 1st toenail is short thick, and discolored distally with proximal clearance and clinical improvement in appearance of nail, no detachment of right hallux nail appears well  attached and growing out normally at this time, no webspace macerations present bilateral, no open lesions present bilateral, no callus/corns/hyperkeratotic tissue present bilateral. No signs of infection bilateral.  Musculoskeletal: No symptomatic boney deformities noted bilateral. Muscular strength within normal limits without painon range of motion. No pain with calf compression bilateral.  Assessment and Plan:  Problem List Items Addressed This Visit    None    Visit Diagnoses    Nail fungus    -  Primary   Pain of right great toe          -Examined patient -Lamisil completed no additional medication needed at this time -Advised good hygiene habits and educated patient on proper foot care to prevent re-infection like previous -Patient to return in 1 year if needed or sooner if symptoms worsen.  Landis Martins, DPM

## 2018-09-29 NOTE — Progress Notes (Signed)
Patient ID: Desiree Anderson, female   DOB: Jun 06, 1956, 62 y.o.   MRN: [id number]  Assessment and Plan:  Hypertension:  -Continue medication,  -monitor blood pressure at home.  -Continue DASH diet.   -Reminder to go to the ER if any CP, SOB, nausea, dizziness, severe HA, changes vision/speech, left arm numbness and tingling, and jaw pain.  Cholesterol: -Continue diet and exercise.  -Check cholesterol.   Pre-diabetes: -Continue diet and exercise.  -Check A1C  Nose bleeds Likely from nasonex, stop, do saline/humidifier check labs  Hypothyroidism Check labs, continue meds same for now, 163mcg daily  Continue diet and meds as discussed. Further disposition pending results of labs. Future Appointments  Date Time Provider [place]  01/02/2019  2:00 PM Unk Pinto, MD [facility] None  04/01/2019 11:15 AM Liane Comber, NP [facility] None  07/09/2019 12:00 PM Magrinat, Virgie Dad, MD [facility] None    HPI 62 y.o. female  presents for 3 month follow up with hypertension, hyperlipidemia, prediabetes and vitamin D.  She uses nasonex daily, had several nose bleeds but stopped nasonex and this has helped, no fever, chills, no bleeding any where else, no bruises.    Her blood pressure has been controlled at home, today their BP is BP: 136/72.   She does workout, she walks a little every day. She denies chest pain, shortness of breath, dizziness.     She is on cholesterol medication, crestor 40 and zetia was added last visit and denies myalgias. Her cholesterol is at goal. The cholesterol last visit was:   Lab Results  Component Value Date   CHOL 243 (H) 06/28/2018   HDL 61 06/28/2018   LDLCALC 150 (H) 06/28/2018   TRIG 183 (H) 06/28/2018   CHOLHDL 4.0 06/28/2018   She is on thyroid medication. Her medication was changed last visit, she is on 161mcg daily, she changed her antacid to mid day and is taking the thyroid first thing in the morning.   Lab Results  Component Value  Date   TSH 13.34 (H) 06/28/2018  .   She has been working on diet and exercise for prediabetes, and denies foot ulcerations, hyperglycemia, hypoglycemia , increased appetite, nausea, paresthesia of the feet, polydipsia, polyuria, visual disturbances, vomiting and weight loss. Last A1C in the office was:  Lab Results  Component Value Date   HGBA1C 5.7 (H) 06/28/2018   Patient is on Vitamin D supplement.  Lab Results  Component Value Date   VD25OH 63 06/28/2018     BMI is Body mass index is 32.22 kg/m., she is working on diet and exercise. Wt Readings from Last 3 Encounters:  09/30/18 179 lb (81.2 kg)  07/03/18 179 lb (81.2 kg)  06/28/18 181 lb (82.1 kg)      Current Medications:  Current Outpatient Medications on File Prior to Visit  Medication Sig Dispense Refill  . ALPRAZolam (XANAX) 0.5 MG tablet Take 1/2 to 1 tablet 2 to 3 x / day ONLY if needed for Anxiety Attacks and please try to limit to 5 days /week to avoid addiction 90 tablet 0  . cetirizine (ZYRTEC) 10 MG tablet Take 10 mg by mouth daily.     . Cholecalciferol (VITAMIN D-3) 5000 UNITS TABS Take 5,000 Units by mouth 2 (two) times daily.     Marland Kitchen ezetimibe (ZETIA) 10 MG tablet Take 1 tablet daily for Cholesterol 90 tablet 1  . fluticasone (FLONASE) 50 MCG/ACT nasal spray 2 SPRAY EACH NOSTRIL TWICE DAILY AS NEEDED FOR ALLERGIES    .  hyoscyamine (LEVSIN SL) 0.125 MG SL tablet DISSOLVE 1 TABLET IN MOUTH EVERY 6 HOURS AS NEEDED 60 tablet 3  . levothyroxine (SYNTHROID) 100 MCG tablet Take 1 tablet on an empty stomach with only water for 30-60 minutes and no antacids for 4 hours 90 tablet 1  . Multiple Vitamins-Minerals (GERITABS PO) Take 1 tablet by mouth daily. Reported on 06/08/2016    . potassium chloride SA (K-DUR,KLOR-CON) 20 MEQ tablet TAKE 1 TABLET BY MOUTH TWICE DAILY 180 tablet 1  . ranitidine (ZANTAC) 300 MG tablet TAKE ONE TABLET BY MOUTH TWICE DAILY 180 tablet 3  . rosuvastatin (CRESTOR) 40 MG tablet TAKE ONE HALF  TABLET BY MOUTH ONCE IN EVENING 45 tablet 1  . tamoxifen (NOLVADEX) 20 MG tablet Take 1 tablet (20 mg total) by mouth daily. 90 tablet 4  . gabapentin (NEURONTIN) 600 MG tablet Take 1/2 to 1 tablet 2 to 3 x / day as need to control pain 270 tablet 1   No current facility-administered medications on file prior to visit.     Medical History:  Past Medical History:  Diagnosis Date  . Anxiety   . Breast cancer ([place])   . Complication of anesthesia    hypotension  . DDD (degenerative disc disease), lumbar   . Erosion of vaginal mesh ([place])   . Fibromyalgia   . GERD (gastroesophageal reflux disease)   . History of gastric ulcer   . History of radiation therapy 07/06/16- 08/22/16   Right Breast  . Hypertension   . Hypothyroidism   . Personal history of radiation therapy   . Rectocele     Allergies:  Allergies  Allergen Reactions  . Iodinated Diagnostic Agents Other (See Comments)    Unknown   . Lipitor [Atorvastatin] Other (See Comments)    Muscle and legs aches  . Shellfish Allergy Diarrhea and Nausea And Vomiting  . Betadine [Povidone Iodine] Rash     Review of Systems:  Review of Systems  Constitutional: Negative for chills, fever and malaise/fatigue.  HENT: Negative for congestion, ear pain and sore throat.   Eyes: Negative.   Respiratory: Negative for cough, shortness of breath and wheezing.   Cardiovascular: Negative for chest pain, palpitations and leg swelling.  Gastrointestinal: Negative for abdominal pain, blood in stool, constipation, diarrhea, heartburn and melena.  Genitourinary: Negative for dysuria, frequency, hematuria and urgency.  Musculoskeletal: Negative for back pain.  Skin: Negative.   Neurological: Negative for dizziness, sensory change, loss of consciousness and headaches.    Family history- Review and unchanged  Social history- Review and unchanged  Physical Exam: BP 136/72   Pulse 80   Temp (!) 97.5 F (36.4 C)   Resp 16   Ht 5' 2.5" (1.588  m)   Wt 179 lb (81.2 kg)   SpO2 98%   BMI 32.22 kg/m  Wt Readings from Last 3 Encounters:  09/30/18 179 lb (81.2 kg)  07/03/18 179 lb (81.2 kg)  06/28/18 181 lb (82.1 kg)    General Appearance: Well nourished well developed, in no apparent distress. Eyes: PERRLA, EOMs, conjunctiva no swelling or erythema ENT/Mouth: Ear canals normal without obstruction, swelling, erythma, discharge.  TMs normal bilaterally.  Oropharynx moist, clear, without exudate, or postoropharyngeal swelling. Nasal mucosa erythematous, swollen, no bleeding vessels, no perforation, no visible polyps.  Neck: Supple, thyroid normal,no cervical adenopathy  Respiratory: Respiratory effort normal, Breath sounds clear A&P without rhonchi, wheeze, or rale.  No retractions, no accessory usage. Cardio: RRR with no MRGs. Brisk peripheral  pulses without edema.  Abdomen: Soft, + BS, nontender, no guarding, rebound, hernias, masses. Musculoskeletal: Full ROM, 5/5 strength, Normal gait Skin: Warm, dry without rashes, lesions, ecchymosis.  Neuro: Awake and oriented X 3, Cranial nerves intact. Normal muscle tone, no cerebellar symptoms. Psych: Normal affect, Insight and Judgment appropriate.     Vicie Mutters, PA-C 11:53 AM [facility]

## 2018-09-30 ENCOUNTER — Encounter: Payer: Self-pay | Admitting: Physician Assistant

## 2018-09-30 ENCOUNTER — Ambulatory Visit: Payer: Medicare Other | Admitting: Physician Assistant

## 2018-09-30 VITALS — BP 136/72 | HR 80 | Temp 97.5°F | Resp 16 | Ht 62.5 in | Wt 179.0 lb

## 2018-09-30 DIAGNOSIS — E039 Hypothyroidism, unspecified: Secondary | ICD-10-CM

## 2018-09-30 DIAGNOSIS — Z23 Encounter for immunization: Secondary | ICD-10-CM

## 2018-09-30 DIAGNOSIS — I10 Essential (primary) hypertension: Secondary | ICD-10-CM

## 2018-09-30 DIAGNOSIS — E782 Mixed hyperlipidemia: Secondary | ICD-10-CM | POA: Diagnosis not present

## 2018-09-30 DIAGNOSIS — R7303 Prediabetes: Secondary | ICD-10-CM | POA: Diagnosis not present

## 2018-09-30 DIAGNOSIS — Z79899 Other long term (current) drug therapy: Secondary | ICD-10-CM

## 2018-09-30 DIAGNOSIS — E669 Obesity, unspecified: Secondary | ICD-10-CM

## 2018-09-30 NOTE — Patient Instructions (Addendum)
Can do a steroid nasal spary 1-2 sparys at night each nostril. Remember to spray each nostril twice towards the outer part of your eye.  Do not sniff but instead pinch your nose and tilt your head back to help the medicine get into your sinuses.  The best time to do this is at bedtime.   Stop if you get blurred vision or nose bleeds.   The nasonex can cause nose bleeds, always stop for a while and can restart WITH saline nasal spray EVERY OTHER day.   Your LDL could improve, ideally we want it under a 100.  Your LDL is the bad cholesterol that can lead to heart attack and stroke. To lower your number you can decrease your fatty foods, red meat, cheese, milk and increase fiber like whole grains and veggies. You can also add a fiber supplement like Citracel or Benefiber, these do not cause gas and bloating and are safe to use. Especially if you have a strong family history of heart disease or stroke or you have evidence of plaque on any imaging like a chest xray, we may discuss at your next office visit putting you on a medication to get your number below 100.    Hypothyroidism Hypothyroidism is a disorder of the thyroid. The thyroid is a large gland that is located in the lower front of the neck. The thyroid releases hormones that control how the body works. With hypothyroidism, the thyroid does not make enough of these hormones. What are the causes? Causes of hypothyroidism may include:  Viral infections.  Pregnancy.  Your own defense system (immune system) attacking your thyroid.  Certain medicines.  Birth defects.  Past radiation treatments to your head or neck.  Past treatment with radioactive iodine.  Past surgical removal of part or all of your thyroid.  Problems with the gland that is located in the center of your brain (pituitary).  What are the signs or symptoms? Signs and symptoms of hypothyroidism may include:  Feeling as though you have no energy (lethargy).  Inability  to tolerate cold.  Weight gain that is not explained by a change in diet or exercise habits.  Dry skin.  Coarse hair.  Menstrual irregularity.  Slowing of thought processes.  Constipation.  Sadness or depression.  How is this diagnosed? Your health care provider may diagnose hypothyroidism with blood tests and ultrasound tests. How is this treated? Hypothyroidism is treated with medicine that replaces the hormones that your body does not make. After you begin treatment, it may take several weeks for symptoms to go away. Follow these instructions at home:  Take medicines only as directed by your health care provider.  If you start taking any new medicines, tell your health care provider.  Keep all follow-up visits as directed by your health care provider. This is important. As your condition improves, your dosage needs may change. You will need to have blood tests regularly so that your health care provider can watch your condition. Contact a health care provider if:  Your symptoms do not get better with treatment.  You are taking thyroid replacement medicine and: ? You sweat excessively. ? You have tremors. ? You feel anxious. ? You lose weight rapidly. ? You cannot tolerate heat. ? You have emotional swings. ? You have diarrhea. ? You feel weak. Get help right away if:  You develop chest pain.  You develop an irregular heartbeat.  You develop a rapid heartbeat. This information is not intended to  replace advice given to you by your health care provider. Make sure you discuss any questions you have with your health care provider. Document Released: 11/13/2005 Document Revised: 04/20/2016 Document Reviewed: 03/31/2014 Elsevier Interactive Patient Education  2018 [employer].

## 2018-10-01 LAB — CBC WITH DIFFERENTIAL/PLATELET
BASOS ABS: 43 {cells}/uL (ref 0–200)
Basophils Relative: 0.6 %
EOS PCT: 1 %
Eosinophils Absolute: 71 cells/uL (ref 15–500)
HEMATOCRIT: 41.3 % (ref 35.0–45.0)
Hemoglobin: 13.3 g/dL (ref 11.7–15.5)
Lymphs Abs: 3231 cells/uL (ref 850–3900)
MCH: 27.3 pg (ref 27.0–33.0)
MCHC: 32.2 g/dL (ref 32.0–36.0)
MCV: 84.6 fL (ref 80.0–100.0)
MPV: 10.6 fL (ref 7.5–12.5)
Monocytes Relative: 6.4 %
NEUTROS PCT: 46.5 %
Neutro Abs: 3302 cells/uL (ref 1500–7800)
PLATELETS: 234 10*3/uL (ref 140–400)
RBC: 4.88 10*6/uL (ref 3.80–5.10)
RDW: 12.3 % (ref 11.0–15.0)
TOTAL LYMPHOCYTE: 45.5 %
WBC mixed population: 454 cells/uL (ref 200–950)
WBC: 7.1 10*3/uL (ref 3.8–10.8)

## 2018-10-01 LAB — COMPLETE METABOLIC PANEL WITH GFR
AG Ratio: 1.6 (calc) (ref 1.0–2.5)
ALKALINE PHOSPHATASE (APISO): 58 U/L (ref 33–130)
ALT: 22 U/L (ref 6–29)
AST: 25 U/L (ref 10–35)
Albumin: 4 g/dL (ref 3.6–5.1)
BUN: 10 mg/dL (ref 7–25)
CALCIUM: 9.1 mg/dL (ref 8.6–10.4)
CO2: 24 mmol/L (ref 20–32)
CREATININE: 0.65 mg/dL (ref 0.50–0.99)
Chloride: 103 mmol/L (ref 98–110)
GFR, Est African American: 112 mL/min/{1.73_m2} (ref >=60)
GFR, Est Non African American: 96 mL/min/{1.73_m2} (ref >=60)
Globulin: 2.5 g/dL (calc) (ref 1.9–3.7)
Glucose, Bld: 84 mg/dL (ref 65–99)
Potassium: 3.9 mmol/L (ref 3.5–5.3)
Sodium: 140 mmol/L (ref 135–146)
Total Bilirubin: 0.4 mg/dL (ref 0.2–1.2)
Total Protein: 6.5 g/dL (ref 6.1–8.1)

## 2018-10-01 LAB — HEMOGLOBIN A1C
EAG (MMOL/L): 6.5 (calc)
HEMOGLOBIN A1C: 5.7 %{Hb} — AB (ref <5.7)
MEAN PLASMA GLUCOSE: 117 (calc)

## 2018-10-01 LAB — LIPID PANEL
CHOL/HDL RATIO: 2.8 (calc) (ref <5.0)
CHOLESTEROL: 122 mg/dL (ref <200)
HDL: 43 mg/dL — AB (ref >50)
LDL CHOLESTEROL (CALC): 59 mg/dL
Non-HDL Cholesterol (Calc): 79 mg/dL (calc) (ref <130)
Triglycerides: 115 mg/dL (ref <150)

## 2018-10-01 LAB — TSH: TSH: 0.03 m[IU]/L — AB (ref 0.40–4.50)

## 2018-10-01 NOTE — Addendum Note (Signed)
Addended by: Vicie Mutters R on: 10/01/2018 08:37 AM   Modules accepted: Orders

## 2018-10-26 ENCOUNTER — Other Ambulatory Visit: Payer: Self-pay | Admitting: Adult Health

## 2018-10-31 ENCOUNTER — Ambulatory Visit: Payer: Medicare Other

## 2018-10-31 ENCOUNTER — Telehealth: Payer: Self-pay | Admitting: Physician Assistant

## 2018-10-31 DIAGNOSIS — E039 Hypothyroidism, unspecified: Secondary | ICD-10-CM

## 2018-10-31 LAB — TSH: TSH: 0.07 m[IU]/L — AB (ref 0.40–4.50)

## 2018-10-31 MED ORDER — FAMOTIDINE 40 MG PO TABS: 40.0000 mg | ORAL_TABLET | Freq: Two times a day (BID) | ORAL | 1 refills | Status: DC

## 2018-10-31 NOTE — Telephone Encounter (Signed)
-----   Message from Elenor Quinones, [place] sent at 10/31/2018 10:19 AM EST ----- Regarding: zantac recall Pt would like a NEW med sent to Pharmacy to replace her ZANTAC due to recall.   Thank you!

## 2018-10-31 NOTE — Progress Notes (Signed)
Reports for LAB----TSH Reports meds   LEVOTHYROXINE 100MCG DAILY EXCEPT ON Sunday-no meds  at this time. Reports concerns rash on neck for 2wks  at this time. Pt spoke with provider & was given advise at that time on how to care for rash on neck. Pt voiced understanding at that time & was taken to the lab waiting area for BLD lab work.  Vitals taken at intake & entered.

## 2018-11-01 ENCOUNTER — Other Ambulatory Visit: Payer: Self-pay | Admitting: Physician Assistant

## 2018-11-01 DIAGNOSIS — E039 Hypothyroidism, unspecified: Secondary | ICD-10-CM

## 2018-11-07 ENCOUNTER — Telehealth: Payer: Self-pay | Admitting: Physician Assistant

## 2018-11-07 MED ORDER — CIMETIDINE 400 MG PO TABS: 400.0000 mg | ORAL_TABLET | Freq: Two times a day (BID) | ORAL | 3 refills | Status: DC

## 2018-11-07 NOTE — Telephone Encounter (Signed)
-----   Message from Elenor Quinones, [place] sent at 11/06/2018 10:15 AM EST ----- Regarding: refill Per pharmacy request   FAMOTIDINE 40 mgs are on backorder can you switch to something else per pharmacy request

## 2018-11-29 ENCOUNTER — Other Ambulatory Visit: Payer: Medicare Other

## 2018-11-29 DIAGNOSIS — E039 Hypothyroidism, unspecified: Secondary | ICD-10-CM | POA: Diagnosis not present

## 2018-11-30 LAB — TSH: TSH: 0.47 mIU/L (ref 0.40–4.50)

## 2018-12-03 ENCOUNTER — Other Ambulatory Visit: Payer: Self-pay

## 2018-12-03 MED ORDER — FAMOTIDINE 40 MG PO TABS: 40.0000 mg | ORAL_TABLET | Freq: Two times a day (BID) | ORAL | 1 refills | Status: DC

## 2018-12-11 ENCOUNTER — Other Ambulatory Visit: Payer: Self-pay

## 2018-12-18 ENCOUNTER — Other Ambulatory Visit: Payer: Self-pay | Admitting: Internal Medicine

## 2019-01-01 ENCOUNTER — Encounter: Payer: Self-pay | Admitting: Internal Medicine

## 2019-01-01 NOTE — Progress Notes (Signed)
[facility] Unk Pinto, M.D.     Uvaldo Bristle. Silverio Lay, P.A.-C Liane Comber, [place] [address] [place] [address] Telephone [phone] Telefax [phone] Annual Screening/Preventative Visit & Comprehensive Evaluation &  Examination     This very nice 63 y.o. MWF  presents for a Screening /Preventative Visit & comprehensive evaluation and management of multiple medical co-morbidities.  Patient has been followed for HTN, HLD, Prediabetes  and Vitamin D Deficiency. She also has GERD controlled with diet and her meds.       Patient has hx/o Rt Breast Cancer s/p Mastectomy (04/2016) post Radiation w/o ChemoTx. She is on Tamoxifen x 5 years and  followed by Dr Jana Hakim       Patient has been on SS Disability for Fibromyalgia and  Chronic Pain Syndrome from DDD/DJD and Interstitial Cystitis since 2005. She has tapered off of her Chronic Opioids.         HTN predates circa 2004. Patient's BP has been controlled at home and patient denies any cardiac symptoms as chest pain, palpitations, shortness of breath, dizziness or ankle swelling. Today's BP is elevated at 156/82.      Patient's hyperlipidemia is controlled with diet and medications. Patient denies myalgias or other medication SE's. Last lipids were at goal: Lab Results  Component Value Date   CHOL 122 09/30/2018   HDL 43 (L) 09/30/2018   LDLCALC 59 09/30/2018   TRIG 115 09/30/2018   CHOLHDL 2.8 09/30/2018      Patient has Obesity (BMI 31+) and  hx/o prediabetes (A1c 6.1% / 2011)  and patient denies reactive hypoglycemic symptoms, visual blurring, diabetic polys or paresthesias. Last A1c was almost to goal:  Lab Results  Component Value Date   HGBA1C 5.7 (H) 09/30/2018      Patient and has been on thyroid replacement since 2002.      Finally, patient has history of Vitamin D Deficiency ("11" / 2008)  and last Vitamin D was at goal: Lab  Results  Component Value Date   VD25OH 63 06/28/2018   Current Outpatient Medications on File Prior to Visit  Medication Sig  . ALPRAZolam (XANAX) 0.5 MG tablet Take 1/2 to 1 tablet 2 to 3 x / day ONLY if needed for Anxiety Attacks and please try to limit to 5 days /week to avoid addiction  . cetirizine (ZYRTEC) 10 MG tablet Take 10 mg by mouth daily.   . Cholecalciferol (VITAMIN D-3) 5000 UNITS TABS Take 5,000 Units by mouth 2 (two) times daily.   . cimetidine (TAGAMET) 400 MG tablet Take 1 tablet (400 mg total) by mouth 2 (two) times daily.  Marland Kitchen ezetimibe (ZETIA) 10 MG tablet Take 1 tablet daily for Cholesterol  . famotidine (PEPCID) 40 MG tablet Take 1 tablet (40 mg total) by mouth 2 (two) times daily. (Patient taking differently: Take 40 mg by mouth as needed. )  . fluticasone (FLONASE) 50 MCG/ACT nasal spray 2 SPRAY EACH NOSTRIL TWICE DAILY AS NEEDED FOR ALLERGIES  . hyoscyamine (LEVSIN SL) 0.125 MG SL tablet DISSOLVE 1 TABLET IN MOUTH EVERY 6 HOURS AS NEEDED  . levothyroxine (SYNTHROID) 100 MCG tablet Take 1 tablet on an empty stomach with only water for 30-60 minutes and no antacids for 4 hours  . Multiple Vitamins-Minerals (GERITABS PO) Take 1 tablet by mouth daily. Reported on 06/08/2016  . potassium chloride SA (K-DUR,KLOR-CON) 20 MEQ tablet TAKE 1 TABLET BY MOUTH TWICE DAILY  . rosuvastatin (  CRESTOR) 20 MG tablet Take 1 tablet daily for Cholesterol  . tamoxifen (NOLVADEX) 20 MG tablet Take 1 tablet (20 mg total) by mouth daily.  Marland Kitchen gabapentin (NEURONTIN) 600 MG tablet Take 1/2 to 1 tablet 2 to 3 x / day as need to control pain   No current facility-administered medications on file prior to visit.    Allergies  Allergen Reactions  . Iodinated Diagnostic Agents Other (See Comments)    Unknown   . Lipitor [Atorvastatin] Other (See Comments)    Muscle and legs aches  . Shellfish Allergy Diarrhea and Nausea And Vomiting  . Betadine [Povidone Iodine] Rash   Past Medical History:   Diagnosis Date  . Anxiety   . Breast cancer ([place])   . Complication of anesthesia    hypotension  . DDD (degenerative disc disease), lumbar   . Erosion of vaginal mesh ([place])   . Fibromyalgia   . GERD (gastroesophageal reflux disease)   . History of gastric ulcer   . History of radiation therapy 07/06/16- 08/22/16   Right Breast  . Hypertension   . Hypothyroidism   . Personal history of radiation therapy   . Rectocele    Health Maintenance  Topic Date Due  . Hepatitis C Screening  June 10, 1956  . HIV Screening  11/30/1972  . PAP SMEAR-Modifier  04/11/2019  . Fecal DNA (Cologuard)  03/05/2020  . MAMMOGRAM  04/26/2020  . TETANUS/TDAP  03/15/2021  . INFLUENZA VACCINE  Completed   Immunization History  Administered Date(s) Administered  . Influenza Inj Mdck Quad With Preservative 09/04/2017, 09/30/2018  . Influenza Split 09/18/2013, 09/25/2014  . Influenza, Seasonal, Injecte, Preservative Fre 10/11/2015  . Influenza,inj,quad, With Preservative 08/28/2016, 08/27/2017  . Influenza-Unspecified 08/27/2016  . Pneumococcal-Unspecified 11/27/2000  . Tdap 03/16/2011   Cologard  03/05/2017 - Negative - due 3 yr f/u in Apr 2021  Last MGM - 04/26/2018  Past Surgical History:  Procedure Laterality Date  . BLADDER TACK  2003  . BREAST BIOPSY Right 04/18/2016  . BREAST LUMPECTOMY Right 05/18/2016  . Breast mass removal Left 1985   Benign  . CARDIOVASCULAR STRESS TEST  07-02-2013  DR DALTON MCLEAN   LOW RISK NUCLEAR STUDY WITH A SMALL, MILD APICAL SEPTAL FIXED PERFUSION DEFECT .  GIVEN NORMAL WALL FUNCTION , SUSPECT REPRESENTS ATTENUATION/  EF 74%/  NO ISCHEMIA  . CATARACT EXTRACTION W/ INTRAOCULAR LENS IMPLANT Left 01/2014  . CATARACT EXTRACTION W/ INTRAOCULAR LENS IMPLANT Right 12/2017   Dr. Manuella Ghazi  . CHOLECYSTECTOMY N/A 12/10/2017   Procedure: LAPAROSCOPIC CHOLECYSTECTOMY;  Surgeon: Rolm Bookbinder, MD;  Location: [facility];  Service: General;  Laterality: N/A;  . CYSTO/   HYDRODISTENTION/  BLADDER BX/   INSTILLATION THERAPY  08-29-2010  . EXCISION OF MESH N/A 03/23/2014   Procedure: EXCISION OF VAGINAL AND PERI-URETHRAL MESH, EXTRACTION FROM URETHRA TO APEX, insertion of XENFORM in the vaginal vault;  Surgeon: Ailene Rud, MD;  Location: [facility];  Service: Urology;  Laterality: N/A;  . EXCISION VAGINAL CYST N/A 04/06/2014   Procedure: VAGINAL SPECULUM EXAMINATION/POSSIBLE DRAINAGE OF HEMATOMA;  Surgeon: Ailene Rud, MD;  Location: [facility];  Service: Urology;  Laterality: N/A;  . INCONTINENCE SURGERY  2009  &  2005  . RADIOACTIVE SEED GUIDED PARTIAL MASTECTOMY WITH AXILLARY SENTINEL LYMPH NODE BIOPSY Right 05/18/2016   Procedure: RADIOACTIVE SEED GUIDED RIGHT BREAST LUMPECTOMY WITH AXILLARY SENTINEL LYMPH NODE BIOPSY;  Surgeon: Rolm Bookbinder, MD;  Location: [facility];  Service: General;  Laterality: Right;  RADIOACTIVE SEED GUIDED RIGHT BREAST LUMPECTOMY WITH AXILLARY SENTINEL LYMPH NODE BIOPSY  . TRANSTHORACIC ECHOCARDIOGRAM  06-16-2013   GRADE I DIASTOLIC DYSFUNCTION/  EF 55-60%/  MILD AR/  TRIVIAL MR  &  TR  . VAGINAL HYSTERECTOMY  2000   w/  unilateral salpingoophorectomy  . VEIN BYPASS SURGERY  AGE 89   RIGHT LEG   Family History  Problem Relation Age of Onset  . Diabetes Mother   . Diverticulitis Mother   . Lung cancer Brother   . Intestinal polyp Sister    Social History   Tobacco Use  . Smoking status: Former Smoker    Years: 20.00    Types: Cigarettes    Last attempt to quit: 11/28/1995    Years since quitting: 23.1  . Smokeless tobacco: Current User    Types: Snuff  . Tobacco comment: dips daily  Substance Use Topics  . Alcohol use: No  . Drug use: No    ROS Constitutional: Denies fever, chills, weight loss/gain, headaches, insomnia,  night sweats, and change in appetite. Does c/o fatigue. Eyes: Denies redness, blurred vision, diplopia, discharge, itchy, watery eyes.   ENT: Denies discharge, congestion, post nasal drip, epistaxis, sore throat, earache, hearing loss, dental pain, Tinnitus, Vertigo, Sinus pain, snoring.  Cardio: Denies chest pain, palpitations, irregular heartbeat, syncope, dyspnea, diaphoresis, orthopnea, PND, claudication, edema Respiratory: denies cough, dyspnea, DOE, pleurisy, hoarseness, laryngitis, wheezing.  Gastrointestinal: Denies dysphagia, heartburn, reflux, water brash, pain, cramps, nausea, vomiting, bloating, diarrhea, constipation, hematemesis, melena, hematochezia, jaundice, hemorrhoids Genitourinary: Denies dysuria, frequency, urgency, nocturia, hesitancy, discharge, hematuria, flank pain Breast: Breast lumps, nipple discharge, bleeding.  Musculoskeletal: Denies arthralgia, myalgia, stiffness, Jt. Swelling, pain, limp, and strain/sprain. Denies falls. Skin: Denies puritis, rash, hives, warts, acne, eczema, changing in skin lesion Neuro: No weakness, tremor, incoordination, spasms, paresthesia, pain Psychiatric: Denies confusion, memory loss, sensory loss. Denies Depression. Endocrine: Denies change in weight, skin, hair change, nocturia, and paresthesia, diabetic polys, visual blurring, hyper / hypo glycemic episodes.  Heme/Lymph: No excessive bleeding, bruising, enlarged lymph nodes.  Physical Exam  BP (!) 156/82   Pulse 62   Temp (!) 97.5 F (36.4 C)   Ht 5' 2.5" (1.588 m)   Wt 176 lb (79.8 kg)   SpO2 97%   BMI 31.68 kg/m   General Appearance: Well nourished, well groomed and in no apparent distress.  Eyes: PERRLA, EOMs, conjunctiva no swelling or erythema, normal fundi and vessels. Sinuses: No frontal/maxillary tenderness ENT/Mouth: EACs patent / TMs  nl. Nares clear without erythema, swelling, mucoid exudates. Oral hygiene is good. No erythema, swelling, or exudate. Tongue normal, non-obstructing. Tonsils not swollen or erythematous. Hearing normal.  Neck: Supple, thyroid not palpable. No bruits, nodes or  JVD. Respiratory: Respiratory effort normal.  BS equal and clear bilateral without rales, rhonci, wheezing or stridor. Cardio: Heart sounds are normal with regular rate and rhythm and no murmurs, rubs or gallops. Peripheral pulses are normal and equal bilaterally without edema. No aortic or femoral bruits. Chest: symmetric with normal excursions and percussion. Breasts:  Dr Vanessa Kick Abdomen: Flat, soft with bowel sounds active. Nontender, no guarding, rebound, hernias, masses, or organomegaly.  Lymphatics: Non tender without lymphadenopathy.  Genitourinary: Dr Vanessa Kick Musculoskeletal: Full ROM all peripheral extremities, joint stability, 5/5 strength, and normal gait. Skin: Warm and dry without rashes, lesions, cyanosis, clubbing or  ecchymosis.  Neuro: Cranial nerves intact, reflexes equal bilaterally. Normal muscle tone, no cerebellar symptoms. Sensation intact.  Pysch: Alert and oriented  X 3, normal affect, Insight and Judgment appropriate.   Assessment and Plan  1. Annual Preventative Screening Examination  2. Essential hypertension  - EKG 12-Lead - Urinalysis, Routine w reflex microscopic - Microalbumin / creatinine urine ratio - CBC with Differential/Platelet - COMPLETE METABOLIC PANEL WITH GFR - Magnesium - TSH  3. Hyperlipidemia, mixed  - EKG 12-Lead - Lipid panel - TSH  4. Abnormal glucose  - EKG 12-Lead - Hemoglobin A1c - Insulin, random  5. Vitamin D deficiency  - VITAMIN D 25 Hydroxyl  6. Gastroesophageal reflux disease  - CBC with Differential/Platelet  7. Hypothyroidism  - TSH  8. Prediabetes  - Hemoglobin A1c  9. Fibromyalgia Syndrome  10. History of breast cancer  11. Medication management  - EKG 12-Lead - Urinalysis, Routine w reflex microscopic - Microalbumin / creatinine urine ratio - CBC with Differential/Platelet - COMPLETE METABOLIC PANEL WITH GFR - Magnesium - Lipid panel - TSH - Hemoglobin A1c - Insulin, random -  VITAMIN D 25 Hydroxyl  12. Screening for colorectal cancer  - POC Hemoccult Bld/Stl   13. Screening for ischemic heart disease         Patient was counseled in prudent diet to achieve/maintain BMI less than 25 for weight control, BP monitoring, regular exercise and medications. Discussed med's effects and SE's. Screening labs and tests as requested with regular follow-up as recommended. Over 40 minutes of exam, counseling, chart review and high complex critical decision making was performed.

## 2019-01-01 NOTE — Patient Instructions (Signed)

## 2019-01-02 ENCOUNTER — Ambulatory Visit (INDEPENDENT_AMBULATORY_CARE_PROVIDER_SITE_OTHER): Payer: Medicare Other | Admitting: Internal Medicine

## 2019-01-02 ENCOUNTER — Encounter: Payer: Self-pay | Admitting: Internal Medicine

## 2019-01-02 VITALS — BP 156/82 | HR 62 | Temp 97.5°F | Ht 62.5 in | Wt 176.0 lb

## 2019-01-02 DIAGNOSIS — R7303 Prediabetes: Secondary | ICD-10-CM

## 2019-01-02 DIAGNOSIS — I10 Essential (primary) hypertension: Secondary | ICD-10-CM | POA: Diagnosis not present

## 2019-01-02 DIAGNOSIS — Z1212 Encounter for screening for malignant neoplasm of rectum: Secondary | ICD-10-CM

## 2019-01-02 DIAGNOSIS — K219 Gastro-esophageal reflux disease without esophagitis: Secondary | ICD-10-CM

## 2019-01-02 DIAGNOSIS — Z0001 Encounter for general adult medical examination with abnormal findings: Secondary | ICD-10-CM

## 2019-01-02 DIAGNOSIS — Z79899 Other long term (current) drug therapy: Secondary | ICD-10-CM

## 2019-01-02 DIAGNOSIS — Z136 Encounter for screening for cardiovascular disorders: Secondary | ICD-10-CM

## 2019-01-02 DIAGNOSIS — E039 Hypothyroidism, unspecified: Secondary | ICD-10-CM | POA: Diagnosis not present

## 2019-01-02 DIAGNOSIS — R7309 Other abnormal glucose: Secondary | ICD-10-CM

## 2019-01-02 DIAGNOSIS — Z0000 Encounter for general adult medical examination without abnormal findings: Secondary | ICD-10-CM

## 2019-01-02 DIAGNOSIS — E559 Vitamin D deficiency, unspecified: Secondary | ICD-10-CM

## 2019-01-02 DIAGNOSIS — E782 Mixed hyperlipidemia: Secondary | ICD-10-CM

## 2019-01-02 DIAGNOSIS — M797 Fibromyalgia: Secondary | ICD-10-CM

## 2019-01-02 DIAGNOSIS — Z1211 Encounter for screening for malignant neoplasm of colon: Secondary | ICD-10-CM

## 2019-01-02 DIAGNOSIS — Z853 Personal history of malignant neoplasm of breast: Secondary | ICD-10-CM

## 2019-01-03 ENCOUNTER — Other Ambulatory Visit: Payer: Self-pay | Admitting: Internal Medicine

## 2019-01-03 DIAGNOSIS — E039 Hypothyroidism, unspecified: Secondary | ICD-10-CM

## 2019-01-03 LAB — URINALYSIS, ROUTINE W REFLEX MICROSCOPIC
Bilirubin Urine: NEGATIVE
Glucose, UA: NEGATIVE
HGB URINE DIPSTICK: NEGATIVE
Leukocytes, UA: NEGATIVE
Nitrite: NEGATIVE
Protein, ur: NEGATIVE
Specific Gravity, Urine: 1.017 (ref 1.001–1.03)
pH: <=5 (ref 5.0–8.0)

## 2019-01-03 LAB — INSULIN, RANDOM: Insulin: 119.2 u[IU]/mL — ABNORMAL HIGH (ref 2.0–19.6)

## 2019-01-03 LAB — CBC WITH DIFFERENTIAL/PLATELET
Absolute Monocytes: 518 cells/uL (ref 200–950)
Basophils Absolute: 58 cells/uL (ref 0–200)
Basophils Relative: 0.6 %
Eosinophils Absolute: 29 cells/uL (ref 15–500)
Eosinophils Relative: 0.3 %
HCT: 38.8 % (ref 35.0–45.0)
Hemoglobin: 13 g/dL (ref 11.7–15.5)
Lymphs Abs: 4339 cells/uL — ABNORMAL HIGH (ref 850–3900)
MCH: 27.5 pg (ref 27.0–33.0)
MCHC: 33.5 g/dL (ref 32.0–36.0)
MCV: 82.2 fL (ref 80.0–100.0)
MONOS PCT: 5.4 %
MPV: 10.6 fL (ref 7.5–12.5)
Neutro Abs: 4656 cells/uL (ref 1500–7800)
Neutrophils Relative %: 48.5 %
Platelets: 274 10*3/uL (ref 140–400)
RBC: 4.72 10*6/uL (ref 3.80–5.10)
RDW: 13.3 % (ref 11.0–15.0)
Total Lymphocyte: 45.2 %
WBC: 9.6 10*3/uL (ref 3.8–10.8)

## 2019-01-03 LAB — COMPLETE METABOLIC PANEL WITH GFR
AG RATIO: 1.9 (calc) (ref 1.0–2.5)
ALT: 33 U/L — ABNORMAL HIGH (ref 6–29)
AST: 27 U/L (ref 10–35)
Albumin: 4.2 g/dL (ref 3.6–5.1)
Alkaline phosphatase (APISO): 55 U/L (ref 37–153)
BUN: 11 mg/dL (ref 7–25)
CO2: 29 mmol/L (ref 20–32)
Calcium: 9.2 mg/dL (ref 8.6–10.4)
Chloride: 104 mmol/L (ref 98–110)
Creat: 0.65 mg/dL (ref 0.50–0.99)
GFR, Est African American: 111 mL/min/{1.73_m2} (ref >=60)
GFR, Est Non African American: 96 mL/min/{1.73_m2} (ref >=60)
Globulin: 2.2 g/dL (calc) (ref 1.9–3.7)
Glucose, Bld: 139 mg/dL — ABNORMAL HIGH (ref 65–99)
Potassium: 4.3 mmol/L (ref 3.5–5.3)
Sodium: 143 mmol/L (ref 135–146)
Total Bilirubin: 0.3 mg/dL (ref 0.2–1.2)
Total Protein: 6.4 g/dL (ref 6.1–8.1)

## 2019-01-03 LAB — MICROALBUMIN / CREATININE URINE RATIO
Creatinine, Urine: 173 mg/dL (ref 20–275)
Microalb Creat Ratio: 5 mcg/mg creat (ref <30)
Microalb, Ur: 0.8 mg/dL

## 2019-01-03 LAB — LIPID PANEL
Cholesterol: 143 mg/dL (ref <200)
HDL: 46 mg/dL — AB (ref >=50)
LDL Cholesterol (Calc): 75 mg/dL (calc)
Non-HDL Cholesterol (Calc): 97 mg/dL (calc) (ref <130)
Total CHOL/HDL Ratio: 3.1 (calc) (ref <5.0)
Triglycerides: 135 mg/dL (ref <150)

## 2019-01-03 LAB — MAGNESIUM: Magnesium: 2 mg/dL (ref 1.5–2.5)

## 2019-01-03 LAB — HEMOGLOBIN A1C
Hgb A1c MFr Bld: 5.9 % of total Hgb — ABNORMAL HIGH (ref <5.7)
Mean Plasma Glucose: 123 (calc)
eAG (mmol/L): 6.8 (calc)

## 2019-01-03 LAB — VITAMIN D 25 HYDROXY (VIT D DEFICIENCY, FRACTURES): Vit D, 25-Hydroxy: 91 ng/mL (ref 30–100)

## 2019-01-03 LAB — TSH: TSH: 0.29 mIU/L — ABNORMAL LOW (ref 0.40–4.50)

## 2019-01-03 MED ORDER — LEVOTHYROXINE SODIUM 100 MCG PO TABS: ORAL_TABLET | ORAL | 1 refills | Status: DC

## 2019-01-04 ENCOUNTER — Other Ambulatory Visit: Payer: Self-pay | Admitting: Adult Health

## 2019-01-05 ENCOUNTER — Encounter: Payer: Self-pay | Admitting: Internal Medicine

## 2019-01-25 ENCOUNTER — Other Ambulatory Visit: Payer: Self-pay | Admitting: Internal Medicine

## 2019-01-25 DIAGNOSIS — E039 Hypothyroidism, unspecified: Secondary | ICD-10-CM

## 2019-03-10 ENCOUNTER — Other Ambulatory Visit: Payer: Self-pay | Admitting: Gastroenterology

## 2019-03-15 ENCOUNTER — Other Ambulatory Visit: Payer: Self-pay | Admitting: Internal Medicine

## 2019-04-01 ENCOUNTER — Ambulatory Visit: Payer: Self-pay | Admitting: Adult Health

## 2019-04-14 NOTE — Progress Notes (Signed)
MEDICARE ANNUAL WELLNESS VISIT AND FOLLOW UP  Assessment:   Desiree Anderson was seen today for follow-up and medicare wellness.  Diagnoses and all orders for this visit:  Medicare annual wellness visit, subsequent Yearly  Essential hypertension No medications at this time Monitor blood pressure at home; call if consistently over 130/80 Continue DASH diet.   Reminder to go to the ER if any CP, SOB, nausea, dizziness, severe HA, changes vision/speech, left arm numbness and tingling and jaw pain. -     CBC with Differential/Platelet -     COMPLETE METABOLIC PANEL WITH GFR -     Magnesium  Hyperlipidemia, mixed Continue medications: Crestor 20mg  and zetia 10mg  Continue low cholesterol diet and exercise.  Check lipid panel.  -     Lipid panel  Vitamin D deficiency Continue supplementation -     VITAMIN D 25 Hydroxy (Vit-D Deficiency, Fractures)  Hypothyroidism, unspecified type Will check labs today, some hyper symptoms -     levothyroxine (SYNTHROID) 100 MCG tablet; Take half tablet (39mcg)  daily on an empty stomach with only water for 30 minutes & no Antacid meds for 4 hours.  Gastroesophageal reflux disease, esophagitis presence not specified Doing well at this time Continue PPI/H2 blocker, diet discussed  Fibromyalgia Syndrome Management with gabapentin -     Magnesium  Cystocele, midline Follows with Dr Harrington Challenger for this  Hypokalemia Taking Rx supplementation Will check labs  Anxiety Doing well on current regiment Taking Alprazolam 0.5mg  half tablet Discussed taking only PRN and limiting to 5 days a week to avoid addiction  Alopecia Will check labs, TSH imbalance? -     Vitamin B12  Malaise and fatigue TSH, electrolytes? Will check labs -     Vitamin B12 -Vitamin D -TSH   Abnormal glucose Discussed dietary and exercise modifications -     Hemoglobin A1c -     Insulin, random  Medication management -     CBC with Differential/Platelet -     COMPLETE  METABOLIC PANEL WITH GFR -     Magnesium -     Lipid panel -     TSH -     Hemoglobin A1c -     Insulin, random -     VITAMIN D 25 Hydroxy (Vit-D Deficiency, Fractures) -     Vitamin B12  BMI 31.0-31.9,adult -     Hemoglobin A1c -     Insulin, random    Over 40 minutes of exam, counseling, chart review and critical decision making was performed Future Appointments  Date Time Provider [place]  05/21/2019 10:30 AM [facility] None  07/09/2019 12:00 PM Magrinat, Virgie Dad, MD [facility] None  07/17/2019  9:30 AM Unk Pinto, MD [facility] None  01/26/2020  2:00 PM Unk Pinto, MD [facility] None  05/04/2020 11:15 AM Garnet Sierras, NP [facility] None     Plan:   During the course of the visit the patient was educated and counseled about appropriate screening and preventive services including:    Pneumococcal vaccine   Prevnar 13  Influenza vaccine  Td vaccine  Screening electrocardiogram  Bone densitometry screening  Colorectal cancer screening  Diabetes screening  Glaucoma screening  Nutrition counseling   Advanced directives: requested   Subjective:  Desiree Anderson is a 63 y.o. female who presents for Medicare Annual Wellness Visit and 3 month follow up on HTN, HLD, hypothyroidism, GERD, weight and vitamin D Defciency. She has history of fibromyalgia and chronic pain from DDD/DJD.  Also  has history of breast cancer, right, s/p mastectomy (04/2016) with radiation and chemo.  She is taking Tamoxifen x5 years and follows with Dr Jana Hakim.  HTN predates 2004.  Her blood pressure has been controlled at home, today their BP is BP: (!) 148/88 She does workout. She denies chest pain, shortness of breath, dizziness.   She is on cholesterol medication and denies myalgias. Her cholesterol is not at goal. The cholesterol last visit was:   Lab Results  Component Value Date   CHOL 163 04/15/2019   HDL 56 04/15/2019   LDLCALC 84 04/15/2019    TRIG 134 04/15/2019   CHOLHDL 2.9 04/15/2019    She has been working on diet and exercise for abnormal glucose/prediabetes, and denies hyperglycemia, hypoglycemia , nausea, polydipsia, polyuria and visual disturbances. Last A1C in the office was:  Lab Results  Component Value Date   HGBA1C 5.9 (H) 01/02/2019   Last GFR:   Lab Results  Component Value Date   GFRNONAA 96 01/02/2019   Lab Results  Component Value Date   GFRAA 111 01/02/2019   Patient is on Vitamin D supplement.  Deficiency noted in 09/2007.  Last check she was at goal. Lab Results  Component Value Date   VD25OH 9 01/02/2019     She has been on thyroid replacement since 2002. Currently taking levothyroxine 39mcg daily.  Her medication was changed last visit. Lab Results  Component Value Date   TSH 0.29 (L) 04/15/2019      Medication Review: Current Outpatient Medications on File Prior to Visit  Medication Sig Dispense Refill  . ALPRAZolam (XANAX) 0.5 MG tablet TAKE 1/2-1 TABLET BY MOUTH 2-3 TIMES DAILY ONLY IF NEEDED FOR ANXIETY ATTACKS AND PLEASE TRY TO LIMIT TO 5 DAYS A WEEK TO AVOID ADDICTION 90 tablet 0  . cetirizine (ZYRTEC) 10 MG tablet Take 10 mg by mouth daily.     . Cholecalciferol (VITAMIN D-3) 5000 UNITS TABS Take 5,000 Units by mouth 2 (two) times daily.     . cimetidine (TAGAMET) 400 MG tablet Take 1 tablet (400 mg total) by mouth 2 (two) times daily. 60 tablet 3  . ezetimibe (ZETIA) 10 MG tablet Take 1 tablet daily for Cholesterol 90 tablet 1  . famotidine (PEPCID) 40 MG tablet Take 1 tablet (40 mg total) by mouth 2 (two) times daily. (Patient taking differently: Take 40 mg by mouth as needed. ) 60 tablet 1  . fluticasone (FLONASE) 50 MCG/ACT nasal spray USE TWO SPRAY(S) IN EACH NOSTRIL ONCE DAILY AS NEEDED 48 g 3  . gabapentin (NEURONTIN) 600 MG tablet Take 1/2 to 1 tablet 2 to 3 x / day as need to control pain 270 tablet 1  . hyoscyamine (LEVSIN SL) 0.125 MG SL tablet DISSOLVE 1 TABLET IN  MOUTH EVERY 6 HOURS AS NEEDED 60 tablet 0  . Multiple Vitamins-Minerals (GERITABS PO) Take 1 tablet by mouth daily. Reported on 06/08/2016    . potassium chloride SA (K-DUR,KLOR-CON) 20 MEQ tablet TAKE 1 TABLET BY MOUTH TWICE DAILY 180 tablet 1  . rosuvastatin (CRESTOR) 20 MG tablet Take 1 tablet daily for Cholesterol 90 tablet 1  . tamoxifen (NOLVADEX) 20 MG tablet Take 1 tablet (20 mg total) by mouth daily. 90 tablet 4   No current facility-administered medications on file prior to visit.     Allergies  Allergen Reactions  . Iodinated Diagnostic Agents Other (See Comments)    Unknown   . Lipitor [Atorvastatin] Other (See Comments)    Muscle  and legs aches  . Shellfish Allergy Diarrhea and Nausea And Vomiting  . Betadine [Povidone Iodine] Rash    Current Problems (verified) Patient Active Problem List   Diagnosis Date Noted  . Insomnia 03/25/2018  . Situational anxiety 03/25/2018  . Encounter for Medicare annual wellness exam 02/19/2017  . Tobacco chew use 05/15/2016  . Malignant neoplasm of upper-outer quadrant of right breast in female, estrogen receptor positive ([place]) 04/21/2016  . Hypothyroidism 04/18/2016  . Obesity (BMI 30.0-34.9) 10/11/2015  . Medication management 09/24/2014  . Cystocele, midline 03/23/2014  . Essential hypertension 12/10/2013  . Mixed hyperlipidemia 12/10/2013  . Prediabetes 12/10/2013  . Vitamin D deficiency 12/10/2013  . DJD  12/10/2013  . DDD, lumbar 12/10/2013  . Fibromyalgia Syndrome 12/10/2013    Screening Tests Immunization History  Administered Date(s) Administered  . Influenza Inj Mdck Quad With Preservative 09/04/2017, 09/30/2018  . Influenza Split 09/18/2013, 09/25/2014  . Influenza, Seasonal, Injecte, Preservative Fre 10/11/2015  . Influenza,inj,quad, With Preservative 08/28/2016, 08/27/2017  . Influenza-Unspecified 08/27/2016  . Pneumococcal-Unspecified 11/27/2000  . Tdap 03/16/2011    Preventative care: Last colonoscopy:over  13 years ago,  02/2017 Cologuard Last mammogram: 2019, scheduled for 2020 Last pap smear/pelvic exam: 2017   DEXA: At age age 29years  Prior vaccinations: TD or Tdap: 2012  Influenza: 2019 Pneumococcal: 2002 Prevnar13: Due at age 60 Shingles/Zostavax: DUE, discussed Shingrix   Names of Other Physician/Practitioners you currently use: 1. [facility] here for primary care 2. Eye Exam Dr Manuella Ghazi 02/2018 3. Dental Exam Dr Minerva Fester, 5 years ago, DUE  Patient Care Team: Unk Pinto, MD as PCP - General (Internal Medicine) Fay Records, MD as Consulting Physician (Cardiology) [name], MD as Consulting Physician (Orthopedic Surgery) Carolan Clines, MD (Inactive) as Consulting Physician (Urology) Vanessa Kick, MD as Consulting Physician (Obstetrics and Gynecology) Magrinat, Virgie Dad, MD as Consulting Physician (Oncology) Rolm Bookbinder, MD as Consulting Physician (General Surgery) Thea Silversmith, MD as Consulting Physician (Radiation Oncology)  SURGICAL HISTORY She  has a past surgical history that includes Incontinence surgery (2009  &  2005); BLADDER TACK (2003); Vein bypass surgery (AGE 61); transthoracic echocardiogram (06-16-2013); Cardiovascular stress test (07-02-2013  [facility]); CYSTO/  HYDRODISTENTION/  BLADDER BX/   INSTILLATION THERAPY (08-29-2010); Vaginal hysterectomy (2000); Cataract extraction w/ intraocular lens implant (Left, 01/2014); Excision of mesh (N/A, 03/23/2014); Excision vaginal cyst (N/A, 04/06/2014); Radioactive seed guided mastectomy with axillary sentinel lymph node biopsy (Right, 05/18/2016); Breast lumpectomy (Right, 05/18/2016); Breast biopsy (Right, 04/18/2016); Cholecystectomy (N/A, 12/10/2017); Cataract extraction w/ intraocular lens implant (Right, 12/2017); and Breast mass removal (Left, 1985). FAMILY HISTORY Her family history includes Diabetes in her mother; Diverticulitis in her mother; Intestinal  polyp in her sister; Lung cancer in her brother. SOCIAL HISTORY She  reports that she quit smoking about 23 years ago. Her smoking use included cigarettes. She quit after 20.00 years of use. Her smokeless tobacco use includes snuff. She reports that she does not drink alcohol or use drugs.   MEDICARE WELLNESS OBJECTIVES: Physical activity: Current Exercise Habits: The patient does not participate in regular exercise at present, Exercise limited by: None identified Cardiac risk factors: Cardiac Risk Factors include: advanced age (>71men, >65 women);dyslipidemia Depression/mood screen:   Depression screen [facility] 2/9 04/15/2019  Decreased Interest 0  Down, Depressed, Hopeless 0  PHQ - 2 Score 0    ADLs:  In your present state of health, do you have any difficulty performing the following activities: 04/15/2019 01/01/2019  Hearing? N N  Vision? N N  Difficulty concentrating or making decisions? N N  Walking or climbing stairs? N N  Dressing or bathing? N N  Doing errands, shopping? N N  Preparing Food and eating ? N -  Using the Toilet? N -  In the past six months, have you accidently leaked urine? N -  Do you have problems with loss of bowel control? N -  Managing your Medications? N -  Managing your Finances? N -  Housekeeping or managing your Housekeeping? N -  Some recent data might be hidden     Cognitive Testing  Alert? Yes  Normal Appearance?Yes  Oriented to person? Yes  Place? Yes   Time? Yes  Recall of three objects?  Yes  Can perform simple calculations? Yes  Displays appropriate judgment?Yes  Can read the correct time from a watch face?Yes  EOL planning: Does Patient Have a Medical Advance Directive?: No Would patient like information on creating a medical advance directive?: Yes (Inpatient - patient defers creating a medical advance directive at this time - Information given)     Objective:     Today's Vitals   04/15/19 1013  BP: (!) 148/88  Pulse: 90  Temp: (!)  97.3 F (36.3 C)  SpO2: 99%  Weight: 176 lb (79.8 kg)  Height: 5' 2.5" (1.588 m)   Body mass index is 31.68 kg/m.  General appearance: alert, no distress, WD/WN, female HEENT: normocephalic, sclerae anicteric, TMs pearly, nares patent, no discharge or erythema, pharynx normal Oral cavity: MMM, no lesions Neck: supple, no lymphadenopathy, no thyromegaly, no masses Heart: RRR, normal S1, S2, no murmurs Lungs: CTA bilaterally, no wheezes, rhonchi, or rales Abdomen: +bs, soft, non tender, non distended, no masses, no hepatomegaly, no splenomegaly Musculoskeletal: nontender, no swelling, no obvious deformity Extremities: no edema, no cyanosis, no clubbing Pulses: 2+ symmetric, upper and lower extremities, normal cap refill Neurological: alert, oriented x 3, CN2-12 intact, strength normal upper extremities and lower extremities, sensation normal throughout, DTRs 2+ throughout, no cerebellar signs, gait normal Psychiatric: normal affect, behavior normal, pleasant   Medicare Attestation I have personally reviewed: [name] medical and social history Their use of alcohol, tobacco or illicit drugs Their current medications and supplements [name] functional ability including ADLs,fall risks, home safety risks, cognitive, and hearing and visual impairment Diet and physical activities Evidence for depression or mood disorders  [name] weight, height, BMI, and visual acuity have been recorded in the chart.  I have made referrals, counseling, and provided education to the patient based on review of the above and I have provided the patient with a written personalized care plan for preventive services.     Garnet Sierras, NP [facility] 04/15/2019  10:43 AM

## 2019-04-15 ENCOUNTER — Other Ambulatory Visit: Payer: Self-pay

## 2019-04-15 ENCOUNTER — Ambulatory Visit: Payer: Self-pay | Admitting: Adult Health

## 2019-04-15 ENCOUNTER — Ambulatory Visit (INDEPENDENT_AMBULATORY_CARE_PROVIDER_SITE_OTHER): Payer: Medicare Other | Admitting: Adult Health Nurse Practitioner

## 2019-04-15 ENCOUNTER — Encounter: Payer: Self-pay | Admitting: Adult Health Nurse Practitioner

## 2019-04-15 VITALS — BP 148/88 | HR 90 | Temp 97.3°F | Ht 62.5 in | Wt 176.0 lb

## 2019-04-15 DIAGNOSIS — Z0001 Encounter for general adult medical examination with abnormal findings: Secondary | ICD-10-CM

## 2019-04-15 DIAGNOSIS — M797 Fibromyalgia: Secondary | ICD-10-CM

## 2019-04-15 DIAGNOSIS — E876 Hypokalemia: Secondary | ICD-10-CM

## 2019-04-15 DIAGNOSIS — E559 Vitamin D deficiency, unspecified: Secondary | ICD-10-CM | POA: Diagnosis not present

## 2019-04-15 DIAGNOSIS — I10 Essential (primary) hypertension: Secondary | ICD-10-CM | POA: Diagnosis not present

## 2019-04-15 DIAGNOSIS — E782 Mixed hyperlipidemia: Secondary | ICD-10-CM

## 2019-04-15 DIAGNOSIS — R5381 Other malaise: Secondary | ICD-10-CM

## 2019-04-15 DIAGNOSIS — R7309 Other abnormal glucose: Secondary | ICD-10-CM | POA: Diagnosis not present

## 2019-04-15 DIAGNOSIS — Z79899 Other long term (current) drug therapy: Secondary | ICD-10-CM

## 2019-04-15 DIAGNOSIS — R5383 Other fatigue: Secondary | ICD-10-CM

## 2019-04-15 DIAGNOSIS — Z0000 Encounter for general adult medical examination without abnormal findings: Secondary | ICD-10-CM

## 2019-04-15 DIAGNOSIS — R6889 Other general symptoms and signs: Secondary | ICD-10-CM

## 2019-04-15 DIAGNOSIS — Z6831 Body mass index (BMI) 31.0-31.9, adult: Secondary | ICD-10-CM

## 2019-04-15 DIAGNOSIS — F419 Anxiety disorder, unspecified: Secondary | ICD-10-CM

## 2019-04-15 DIAGNOSIS — E039 Hypothyroidism, unspecified: Secondary | ICD-10-CM

## 2019-04-15 DIAGNOSIS — L659 Nonscarring hair loss, unspecified: Secondary | ICD-10-CM

## 2019-04-15 DIAGNOSIS — N8111 Cystocele, midline: Secondary | ICD-10-CM

## 2019-04-15 DIAGNOSIS — K219 Gastro-esophageal reflux disease without esophagitis: Secondary | ICD-10-CM

## 2019-04-15 NOTE — Patient Instructions (Signed)
Desiree Anderson , Thank you for taking time to come for your Medicare Wellness Visit. I appreciate your ongoing commitment to your health goals. Please review the following plan we discussed and let me know if I can assist you in the future.    This is a list of the screening recommended for you and due dates:  Health Maintenance  Topic Date Due    Hepatitis C: One time screening is recommended by Center for Disease Control  (CDC) for  adults born from [birth year] through 1963.   08-04-1956   HIV Screening  11/30/1972   Pap Smear  04/11/2019   Flu Shot  06/28/2019   Cologuard (Stool DNA test)  03/05/2020   Mammogram  04/26/2020   Tetanus Vaccine  03/15/2021   You are due for your Mammogram this year, which you report is scheduled.  You are due for a Shingles vaccination.  The Zostavax or Shingrix.  You can get this from your local pharmacy.  Call to check against your insurance and coverage.  We will contact you in 1-3 days with your lab results.   Ask insurance and pharmacy about shingrix - new vaccine   Can go to [email or web address] for more information  Shingrix Vaccination  Two vaccines are licensed and recommended to prevent shingles in the [place] Zoster vaccine live (ZVL, Zostavax) has been in use since 2006. Recombinant zoster vaccine (RZV, Shingrix), has been in use since 2017 and is recommended by ACIP as the preferred shingles vaccine.  What Everyone Should Know about Shingles Vaccine (Shingrix) One of the Recommended Vaccines by Disease Shingles vaccination is the only way to protect against shingles and postherpetic neuralgia (PHN), the most common complication from shingles. CDC recommends that healthy adults 50 years and older get two doses of the shingles vaccine called Shingrix (recombinant zoster vaccine), separated by 2 to 6 months, to prevent shingles and the complications from the disease. Your doctor or pharmacist can  give you Shingrix as a shot in your upper arm. Shingrix provides strong protection against shingles and PHN. Two doses of Shingrix is more than 90% effective at preventing shingles and PHN. Protection stays above 85% for at least the first four years after you get vaccinated. Shingrix is the preferred vaccine, over Zostavax (zoster vaccine live), a shingles vaccine in use since 2006. Zostavax may still be used to prevent shingles in healthy adults 61 years and older. For example, you could use Zostavax if a person is allergic to Shingrix, prefers Zostavax, or requests immediate vaccination and Shingrix is unavailable. Who Should Get Shingrix? Healthy adults 50 years and older should get two doses of Shingrix, separated by 2 to 6 months. You should get Shingrix even if in the past you  had shingles   received Zostavax   are not sure if you had chickenpox There is no maximum age for getting Shingrix. If you had shingles in the past, you can get Shingrix to help prevent future occurrences of the disease. There is no specific length of time that you need to wait after having shingles before you can receive Shingrix, but generally you should make sure the shingles rash has gone away before getting vaccinated. You can get Shingrix whether or not you remember having had chickenpox in the past. Studies show that more than 99% of Americans 40 years and older have had chickenpox, even if they dont remember having the disease. Chickenpox and shingles are related because they are caused by the same virus (varicella zoster  virus). After a person recovers from chickenpox, the virus stays dormant (inactive) in the body. It can reactivate years later and cause shingles. If you had Zostavax in the recent past, you should wait at least eight weeks before getting Shingrix. Talk to your healthcare provider to determine the best time to get Shingrix. Shingrix is available in doctors offices and pharmacies. To find  doctors offices or pharmacies near you that offer the vaccine, visit HealthMap Vaccine FinderExternal. If you have questions about Shingrix, talk with your healthcare provider. Vaccine for Those 12 Years and Older  Shingrix reduces the risk of shingles and PHN by more than 90% in people 70 and older. CDC recommends the vaccine for healthy adults 28 and older.  Who Should Not Get Shingrix? You should not get Shingrix if you:  have ever had a severe allergic reaction to any component of the vaccine or after a dose of Shingrix   tested negative for immunity to varicella zoster virus. If you test negative, you should get chickenpox vaccine.   currently have shingles   currently are pregnant or breastfeeding. Women who are pregnant or breastfeeding should wait to get Shingrix.   receive specific antiviral drugs (acyclovir, famciclovir, or valacyclovir) 24 hours before vaccination (avoid use of these antiviral drugs for 14 days after vaccination)- zoster vaccine live only If you have a minor acute (starts suddenly) illness, such as a cold, you may get Shingrix. But if you have a moderate or severe acute illness, you should usually wait until you recover before getting the vaccine. This includes anyone with a temperature of 101.66F or higher. The side effects of the Shingrix are temporary, and usually last 2 to 3 days. While you may experience pain for a few days after getting Shingrix, the pain will be less severe than having shingles and the complications from the disease. How Well Does Shingrix Work? Two doses of Shingrix provides strong protection against shingles and postherpetic neuralgia (PHN), the most common complication of shingles.  In adults 32 to 61 years old who got two doses, Shingrix was 97% effective in preventing shingles; among adults 70 years and older, Shingrix was 91% effective.   In adults 110 to 61 years old who got two doses, Shingrix was 91% effective in preventing PHN;  among adults 70 years and older, Shingrix was 89% effective. Shingrix protection remained high (more than 85%) in people 70 years and older throughout the four years following vaccination. Since your risk of shingles and PHN increases as you get older, it is important to have strong protection against shingles in your older years. Top of Page  What Are the Possible Side Effects of Shingrix? Studies show that Shingrix is safe. The vaccine helps your body create a strong defense against shingles. As a result, you are likely to have temporary side effects from getting the shots. The side effects may affect your ability to do normal daily activities for 2 to 3 days. Most people got a sore arm with mild or moderate pain after getting Shingrix, and some also had redness and swelling where they got the shot. Some people felt tired, had muscle pain, a headache, shivering, fever, stomach pain, or nausea. About 1 out of 6 people who got Shingrix experienced side effects that prevented them from doing regular activities. Symptoms went away on their own in about 2 to 3 days. Side effects were more common in younger people. You might have a reaction to the first or second dose of  Shingrix, or both doses. If you experience side effects, you may choose to take over-the-counter pain medicine such as ibuprofen or acetaminophen. If you experience side effects from Shingrix, you should report them to the Vaccine Adverse Event Reporting System (VAERS). Your doctor might file this report, or you can do it yourself through the VAERS websiteExternal, or by calling [phone]. If you have any questions about side effects from Shingrix, talk with your doctor. The shingles vaccine does not contain thimerosal (a preservative containing mercury). Top of Page  When Should I See a Doctor Because of the Side Effects I Experience From Shingrix? In clinical trials, Shingrix was not associated with serious adverse events. In fact,  serious side effects from vaccines are extremely rare. For example, for every 1 million doses of a vaccine given, only one or two people may have a severe allergic reaction. Signs of an allergic reaction happen within minutes or hours after vaccination and include hives, swelling of the face and throat, difficulty breathing, a fast heartbeat, dizziness, or weakness. If you experience these or any other life-threatening symptoms, see a doctor right away. Shingrix causes a strong response in your immune system, so it may produce short-term side effects more intense than you are used to from other vaccines. These side effects can be uncomfortable, but they are expected and usually go away on their own in 2 or 3 days. Top of Page  How Can I Pay For Shingrix? There are several ways shingles vaccine may be paid for: Medicare  Medicare Part D plans cover the shingles vaccine, but there may be a cost to you depending on your plan. There may be a copay for the vaccine, or you may need to pay in full then get reimbursed for a certain amount.   Medicare Part B does not cover the shingles vaccine. Medicaid  Medicaid may or may not cover the vaccine. Contact your insurer to find out. Private health insurance  Many private health insurance plans will cover the vaccine, but there may be a cost to you depending on your plan. Contact your insurer to find out. Vaccine assistance programs  Some pharmaceutical companies provide vaccines to eligible adults who cannot afford them. You may want to check with the vaccine manufacturer, [employer], about Shingrix. If you do not currently have health insurance, learn more about affordable health coverage optionsExternal. To find doctors offices or pharmacies near you that offer the vaccine, visit HealthMap Vaccine FinderExternal.

## 2019-04-16 LAB — LIPID PANEL
Cholesterol: 163 mg/dL (ref <200)
HDL: 56 mg/dL (ref >=50)
LDL Cholesterol (Calc): 84 mg/dL (calc)
Non-HDL Cholesterol (Calc): 107 mg/dL (calc) (ref <130)
Total CHOL/HDL Ratio: 2.9 (calc) (ref <5.0)
Triglycerides: 134 mg/dL (ref <150)

## 2019-04-16 LAB — COMPLETE METABOLIC PANEL WITH GFR
AG Ratio: 1.8 (calc) (ref 1.0–2.5)
ALT: 25 U/L (ref 6–29)
AST: 25 U/L (ref 10–35)
Albumin: 4.1 g/dL (ref 3.6–5.1)
Alkaline phosphatase (APISO): 56 U/L (ref 37–153)
BUN: 10 mg/dL (ref 7–25)
CO2: 28 mmol/L (ref 20–32)
Calcium: 8.9 mg/dL (ref 8.6–10.4)
Chloride: 103 mmol/L (ref 98–110)
Creat: 0.73 mg/dL (ref 0.50–0.99)
GFR, Est African American: 103 mL/min/{1.73_m2} (ref >=60)
GFR, Est Non African American: 89 mL/min/{1.73_m2} (ref >=60)
Globulin: 2.3 g/dL (calc) (ref 1.9–3.7)
Glucose, Bld: 97 mg/dL (ref 65–99)
Potassium: 4 mmol/L (ref 3.5–5.3)
Sodium: 141 mmol/L (ref 135–146)
Total Bilirubin: 0.4 mg/dL (ref 0.2–1.2)
Total Protein: 6.4 g/dL (ref 6.1–8.1)

## 2019-04-16 LAB — VITAMIN B12: Vitamin B-12: 344 pg/mL (ref 200–1100)

## 2019-04-16 LAB — CBC WITH DIFFERENTIAL/PLATELET
Absolute Monocytes: 470 cells/uL (ref 200–950)
Basophils Absolute: 41 cells/uL (ref 0–200)
Basophils Relative: 0.5 %
Eosinophils Absolute: 113 cells/uL (ref 15–500)
Eosinophils Relative: 1.4 %
HCT: 42.4 % (ref 35.0–45.0)
Hemoglobin: 13.9 g/dL (ref 11.7–15.5)
Lymphs Abs: 3564 cells/uL (ref 850–3900)
MCH: 27.6 pg (ref 27.0–33.0)
MCHC: 32.8 g/dL (ref 32.0–36.0)
MCV: 84.1 fL (ref 80.0–100.0)
MPV: 10.8 fL (ref 7.5–12.5)
Monocytes Relative: 5.8 %
Neutro Abs: 3912 cells/uL (ref 1500–7800)
Neutrophils Relative %: 48.3 %
Platelets: 230 10*3/uL (ref 140–400)
RBC: 5.04 10*6/uL (ref 3.80–5.10)
RDW: 13.2 % (ref 11.0–15.0)
Total Lymphocyte: 44 %
WBC: 8.1 10*3/uL (ref 3.8–10.8)

## 2019-04-16 LAB — HEMOGLOBIN A1C
Hgb A1c MFr Bld: 5.7 % of total Hgb — ABNORMAL HIGH (ref <5.7)
Mean Plasma Glucose: 117 (calc)
eAG (mmol/L): 6.5 (calc)

## 2019-04-16 LAB — MAGNESIUM: Magnesium: 1.9 mg/dL (ref 1.5–2.5)

## 2019-04-16 LAB — INSULIN, RANDOM: Insulin: 19.7 u[IU]/mL — ABNORMAL HIGH

## 2019-04-16 LAB — TSH: TSH: 15.79 mIU/L — ABNORMAL HIGH (ref 0.40–4.50)

## 2019-04-16 LAB — VITAMIN D 25 HYDROXY (VIT D DEFICIENCY, FRACTURES): Vit D, 25-Hydroxy: 85 ng/mL (ref 30–100)

## 2019-04-17 MED ORDER — LEVOTHYROXINE SODIUM 100 MCG PO TABS: ORAL_TABLET | ORAL | 3 refills | Status: DC

## 2019-04-22 ENCOUNTER — Other Ambulatory Visit: Payer: Self-pay | Admitting: Adult Health

## 2019-05-20 ENCOUNTER — Other Ambulatory Visit: Payer: Self-pay | Admitting: Obstetrics and Gynecology

## 2019-05-20 ENCOUNTER — Other Ambulatory Visit: Payer: Self-pay | Admitting: Oncology

## 2019-05-20 DIAGNOSIS — Z9889 Other specified postprocedural states: Secondary | ICD-10-CM

## 2019-05-21 ENCOUNTER — Other Ambulatory Visit: Payer: Self-pay

## 2019-05-21 ENCOUNTER — Other Ambulatory Visit: Payer: Medicare Other

## 2019-05-21 DIAGNOSIS — E039 Hypothyroidism, unspecified: Secondary | ICD-10-CM | POA: Diagnosis not present

## 2019-05-22 ENCOUNTER — Other Ambulatory Visit: Payer: Self-pay | Admitting: Adult Health Nurse Practitioner

## 2019-05-22 DIAGNOSIS — E039 Hypothyroidism, unspecified: Secondary | ICD-10-CM

## 2019-05-22 LAB — TSH: TSH: 5.92 mIU/L — ABNORMAL HIGH (ref 0.40–4.50)

## 2019-05-22 NOTE — Progress Notes (Signed)
Last TSH 5.92.  Dosing changed to 82mcg two days a week and half tablet (84mcg) five days a week.  Re-check in 4-6 weeks.  Garnet Sierras, NP [facility] 05/22/2019  9:21 AM

## 2019-05-28 ENCOUNTER — Ambulatory Visit
Admission: RE | Admit: 2019-05-28 | Discharge: 2019-05-28 | Disposition: A | Discharge status: Home / Self Care | Payer: Medicare Other | Source: Ambulatory Visit | Attending: Oncology | Admitting: Oncology

## 2019-05-28 ENCOUNTER — Other Ambulatory Visit: Payer: Self-pay

## 2019-05-28 DIAGNOSIS — Z853 Personal history of malignant neoplasm of breast: Secondary | ICD-10-CM | POA: Diagnosis not present

## 2019-05-28 DIAGNOSIS — R928 Other abnormal and inconclusive findings on diagnostic imaging of breast: Secondary | ICD-10-CM | POA: Diagnosis not present

## 2019-05-28 DIAGNOSIS — Z9889 Other specified postprocedural states: Secondary | ICD-10-CM

## 2019-06-12 ENCOUNTER — Other Ambulatory Visit: Payer: Self-pay

## 2019-06-12 MED ORDER — ALPRAZOLAM 0.5 MG PO TABS: ORAL_TABLET | ORAL | 0 refills | Status: DC

## 2019-06-12 NOTE — Telephone Encounter (Signed)
Refill request for Alprazolam. In que for review.

## 2019-06-26 ENCOUNTER — Other Ambulatory Visit: Payer: Medicare Other

## 2019-06-26 ENCOUNTER — Other Ambulatory Visit: Payer: Self-pay

## 2019-06-26 DIAGNOSIS — E039 Hypothyroidism, unspecified: Secondary | ICD-10-CM

## 2019-06-27 ENCOUNTER — Other Ambulatory Visit: Payer: Self-pay | Admitting: Adult Health Nurse Practitioner

## 2019-06-27 DIAGNOSIS — E039 Hypothyroidism, unspecified: Secondary | ICD-10-CM

## 2019-06-27 LAB — TSH: TSH: 59.11 mIU/L — ABNORMAL HIGH (ref 0.40–4.50)

## 2019-06-27 MED ORDER — LEVOTHYROXINE SODIUM 50 MCG PO TABS: 50.0000 ug | ORAL_TABLET | Freq: Every day | ORAL | 1 refills | Status: DC

## 2019-06-27 NOTE — Progress Notes (Signed)
Spoke with patient via phone with TSH results 59 (5).  Asym[ptomatic, denies any changes to medications and verbalizes correct timing and dosing of medication.  Levothyroxine dosing changed from 60mcg two days a week and 76mcg 5 days a week. To levothyroxine 69mcg daily.  Patient agrees with plan of care, all quiestions answered and new Rx sent.  Follow up in 6 weeks for TSH lab recheck.  Garnet Sierras, NP [facility] 06/27/2019  10:13 AM

## 2019-07-08 NOTE — Progress Notes (Signed)
[facility]  [phone] Fax:([phone]) [phone]     ID: Desiree Anderson DOB: December 19, 1955  MR#: [id number]  VFI#:[id number]  Patient Care Team: Lucky Cowboy, MD as PCP - General (Internal Medicine) Pricilla Riffle, MD as Consulting Physician (Cardiology) Eldred Manges, MD as Consulting Physician (Orthopedic Surgery) Jethro Bolus, MD (Inactive) as Consulting Physician (Urology) Waynard Reeds, MD as Consulting Physician (Obstetrics and Gynecology) Jeptha Hinnenkamp, Valentino Hue, MD as Consulting Physician (Oncology) Emelia Loron, MD as Consulting Physician (General Surgery) Lurline Hare, MD as Consulting Physician (Radiation Oncology) PCP: Lucky Cowboy, MD GYN: OTHER MD:  CHIEF COMPLAINT: Estrogen receptor positive invasive breast cancer  CURRENT TREATMENT: Tamoxifen   BREAST CANCER HISTORY: From the original intake note:  Desiree Anderson had routine screening mammography showing a possible area of distortion in the right breast. On 04/18/2016 she had diagnostic right mammography with tomography [facility] Breast density was category C. In the upper outer quadrant of the right breast there was a persistent area of architectural distortion which was not palpable on exam. Ultrasonography confirmed a 1.0 cm irregular mass at the 11:00 position 3 cm from the nipple. There was no right axillary lymphadenopathy by ultrasonography.  Biopsy of the right breast mass in question was obtained the same day and showed (S [id number] [id number]) an invasive ductal carcinoma, grade 2, estrogen and progesterone receptor both 100% positive with strong staining intensity, with an MIB-1 of 5%, and no HER-2 amplification, the signals ratio being 1.57, the number per cell 3.85.  Her subsequent history is as detailed below   INTERVAL HISTORY: Desiree Anderson returns today for follow-up and treatment of her estrogen positive bresat cancer.  She continues on  tamoxifen. She has hot flashes with diaphoresis. She does not have issues with vaginal dryness.   Since her last visit here, she underwent a digital diagnostic bilateral mammogram with tomography on 05/28/2019 showing: Breast Density Category B. There is no evidence of malignancy in either breast.     REVIEW OF SYSTEMS: Desiree Anderson notes that she has been trying to keep herself busy around the house amid the pandemic. She has a decent sized garden, which she has someone to help with. She notes that she has gained a little weight since 12/2018. 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 She notes a "new" bump on her right breast that she believes has changed in the last few weeks. The patient denies unusual headaches, visual changes, nausea, vomiting, or dizziness. There has been no unusual cough, phlegm production, or pleurisy. This been no change in bowel or bladder habits. The patient denies unexplained fatigue or unexplained weight loss, bleeding, rash, or fever. A detailed review of systems was otherwise noncontributory.    PAST MEDICAL HISTORY: Past Medical History:  Diagnosis Date  . Anxiety   . Breast cancer (HCC)   . Complication of anesthesia    hypotension  . DDD (degenerative disc disease), lumbar   . Erosion of vaginal mesh (HCC)   . Fibromyalgia   . GERD (gastroesophageal reflux disease)   . History of gastric ulcer   . History of radiation therapy 07/06/16- 08/22/16   Right Breast  . Hypertension   . Hypothyroidism   . Personal history of radiation therapy   . Rectocele     PAST SURGICAL HISTORY: Past Surgical History:  Procedure Laterality Date  . BLADDER TACK  2003  . BREAST BIOPSY Right 04/18/2016  . BREAST  LUMPECTOMY Right 05/18/2016  . Breast mass removal Left 1985   Benign  . CARDIOVASCULAR STRESS TEST  07-02-2013  DR DALTON MCLEAN   LOW RISK NUCLEAR STUDY WITH A SMALL, MILD APICAL SEPTAL FIXED PERFUSION DEFECT .  GIVEN NORMAL  WALL FUNCTION , SUSPECT REPRESENTS ATTENUATION/  EF 74%/  NO ISCHEMIA  . CATARACT EXTRACTION W/ INTRAOCULAR LENS IMPLANT Left 01/2014  . CATARACT EXTRACTION W/ INTRAOCULAR LENS IMPLANT Right 12/2017   Dr. Sherryll Burger  . CHOLECYSTECTOMY N/A 12/10/2017   Procedure: LAPAROSCOPIC CHOLECYSTECTOMY;  Surgeon: Emelia Loron, MD;  Location: [facility] OR;  Service: General;  Laterality: N/A;  . CYSTO/  HYDRODISTENTION/  BLADDER BX/   INSTILLATION THERAPY  08-29-2010  . EXCISION OF MESH N/A 03/23/2014   Procedure: EXCISION OF VAGINAL AND PERI-URETHRAL MESH, EXTRACTION FROM URETHRA TO APEX, insertion of XENFORM in the vaginal vault;  Surgeon: Kathi Ludwig, MD;  Location: [facility];  Service: Urology;  Laterality: N/A;  . EXCISION VAGINAL CYST N/A 04/06/2014   Procedure: VAGINAL SPECULUM EXAMINATION/POSSIBLE DRAINAGE OF HEMATOMA;  Surgeon: Kathi Ludwig, MD;  Location: [facility];  Service: Urology;  Laterality: N/A;  . INCONTINENCE SURGERY  2009  &  2005  . RADIOACTIVE SEED GUIDED PARTIAL MASTECTOMY WITH AXILLARY SENTINEL LYMPH NODE BIOPSY Right 05/18/2016   Procedure: RADIOACTIVE SEED GUIDED RIGHT BREAST LUMPECTOMY WITH AXILLARY SENTINEL LYMPH NODE BIOPSY;  Surgeon: Emelia Loron, MD;  Location: [facility];  Service: General;  Laterality: Right;  RADIOACTIVE SEED GUIDED RIGHT BREAST LUMPECTOMY WITH AXILLARY SENTINEL LYMPH NODE BIOPSY  . TRANSTHORACIC ECHOCARDIOGRAM  06-16-2013   GRADE I DIASTOLIC DYSFUNCTION/  EF 55-60%/  MILD AR/  TRIVIAL MR  &  TR  . VAGINAL HYSTERECTOMY  2000   w/  unilateral salpingoophorectomy  . VEIN BYPASS SURGERY  AGE 13   RIGHT LEG    FAMILY HISTORY Family History  Problem Relation Age of Onset  . Diabetes Mother   . Diverticulitis Mother   . Lung cancer Brother   . Intestinal polyp Sister   [name] father died at the age of 50 from a myocardial infarction. He was not a smoker however. [name] mother died  with renal failure at the age of 13. The patient had 4 brothers, 3 sisters. The only cancer in the family was one brother diagnosed with lung cancer at the age of 76. There is no history of breast or ovarian cancer in the family.   GYNECOLOGIC HISTORY:  No LMP recorded. Patient has had an ablation. Menarche age 45, first live birth age 28, the patient is GX P2. She stopped having periods approximately 2003. She took hormone replacement approximately 3 years. She also used birth control pills remotely for approximately 20 years, with no complications.   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 Her husband Reita Cliche ran the family business, produce and plants, in [employer], for 17 years. He is now retired. Daughter Human resources officer lives in [place] and is a homemaker. Son Dena Billet Goins lives in [place] and has multiple health problems. The patient has 2 granddaughters. She also has 2 grandchildren who have died. She is a Investment banker, operational.    ADVANCED DIRECTIVES: Not in place   HEALTH MAINTENANCE: Social History   Tobacco Use  . Smoking status: Former Smoker    Years: 20.00    Types: Cigarettes  Quit date: 11/28/1995    Years since quitting: 23.6  . Smokeless tobacco: Current User    Types: Snuff  . Tobacco comment: dips daily  Substance Use Topics  . Alcohol use: No  . Drug use: No     Colonoscopy: 2007/ [place]  PAP:  Bone density: 2012/ [place]  Lipid panel:  Allergies  Allergen Reactions  . Iodinated Diagnostic Agents Other (See Comments)    Unknown   . Lipitor [Atorvastatin] Other (See Comments)    Muscle and legs aches  . Shellfish Allergy Diarrhea and Nausea And Vomiting  . Betadine [Povidone Iodine] Rash    Current Outpatient Medications  Medication Sig Dispense Refill  . ALPRAZolam (XANAX) 0.5 MG tablet TAKE 1/2-1 TABLET BY MOUTH 2-3 TIMES DAILY ONLY IF NEEDED FOR ANXIETY  ATTACKS AND PLEASE TRY TO LIMIT TO 5 DAYS A WEEK TO AVOID ADDICTION 90 tablet 0  . cetirizine (ZYRTEC) 10 MG tablet Take 10 mg by mouth daily.     . Cholecalciferol (VITAMIN D-3) 5000 UNITS TABS Take 5,000 Units by mouth 2 (two) times daily.     . cimetidine (TAGAMET) 400 MG tablet Take 1 tablet (400 mg total) by mouth 2 (two) times daily. 60 tablet 3  . ezetimibe (ZETIA) 10 MG tablet Take 1 tablet daily for Cholesterol 90 tablet 1  . famotidine (PEPCID) 40 MG tablet Take 1 tablet (40 mg total) by mouth 2 (two) times daily. (Patient taking differently: Take 40 mg by mouth as needed. ) 60 tablet 1  . fluticasone (FLONASE) 50 MCG/ACT nasal spray USE TWO SPRAY(S) IN EACH NOSTRIL ONCE DAILY AS NEEDED 48 g 3  . gabapentin (NEURONTIN) 600 MG tablet Take 1/2 to 1 tablet 2 to 3 x / day as need to control pain 270 tablet 1  . hyoscyamine (LEVSIN SL) 0.125 MG SL tablet DISSOLVE 1 TABLET IN MOUTH EVERY 6 HOURS AS NEEDED 60 tablet 0  . levothyroxine (SYNTHROID) 50 MCG tablet Take 1 tablet (50 mcg total) by mouth daily. on empty stomach. 90 tablet 1  . Multiple Vitamins-Minerals (GERITABS PO) Take 1 tablet by mouth daily. Reported on 06/08/2016    . potassium chloride SA (K-DUR) 20 MEQ tablet Take 1 tablet by mouth twice daily 180 tablet 1  . rosuvastatin (CRESTOR) 20 MG tablet Take 1 tablet daily for Cholesterol 90 tablet 1  . tamoxifen (NOLVADEX) 20 MG tablet Take 1 tablet (20 mg total) by mouth daily. 90 tablet 4  . venlafaxine XR (EFFEXOR-XR) 37.5 MG 24 hr capsule Take 1 capsule (37.5 mg total) by mouth daily with breakfast. 90 capsule 4   No current facility-administered medications for this visit.     OBJECTIVE: Middle aged white woman who appears stated age  Vitals:   07/09/19 1205  BP: (!) 144/69  Pulse: 77  Resp: 18  Temp: 98 F (36.7 C)  SpO2: 100%   Body mass index is 32.18 kg/m. ECOG FS:1 - Symptomatic but completely ambulatory  Sclerae unicteric, EOMs intact Wearing a mask No  cervical or supraclavicular adenopathy Lungs no rales or rhonchi Heart regular rate and rhythm Abd soft, nontender, positive bowel sounds MSK no focal spinal tenderness, no upper extremity lymphedema Neuro: nonfocal, well oriented, appropriate affect Breasts: In the upper portion and slightly laterally of the right breast is a large firm area that seems to me a little bit more easily palpable than prior.  There is no erythema or tenderness.  The nipple is unchanged.  The right axilla  is benign.  The left breast is unremarkable.  The left axilla is benign.   LAB RESULTS:  CMP     Component Value Date/Time   NA 141 04/15/2019 1053   NA 144 07/03/2017 1241   K 4.0 04/15/2019 1053   K 3.8 07/03/2017 1241   CL 103 04/15/2019 1053   CO2 28 04/15/2019 1053   CO2 27 07/03/2017 1241   GLUCOSE 97 04/15/2019 1053   GLUCOSE 142 (H) 07/03/2017 1241   BUN 10 04/15/2019 1053   BUN 11.2 07/03/2017 1241   CREATININE 0.73 04/15/2019 1053   CREATININE 0.9 07/03/2017 1241   CALCIUM 8.9 04/15/2019 1053   CALCIUM 9.2 07/03/2017 1241   PROT 6.4 04/15/2019 1053   PROT 6.7 07/03/2017 1241   ALBUMIN 3.4 (L) 12/10/2017 0749   ALBUMIN 3.7 07/03/2017 1241   AST 25 04/15/2019 1053   AST 22 07/03/2017 1241   ALT 25 04/15/2019 1053   ALT 37 07/03/2017 1241   ALKPHOS 49 12/10/2017 0749   ALKPHOS 55 07/03/2017 1241   BILITOT 0.4 04/15/2019 1053   BILITOT 0.29 07/03/2017 1241   GFRNONAA 89 04/15/2019 1053   GFRAA 103 04/15/2019 1053    INo results found for: SPEP, UPEP  Lab Results  Component Value Date   WBC 8.1 04/15/2019   NEUTROABS 3,912 04/15/2019   HGB 13.9 04/15/2019   HCT 42.4 04/15/2019   MCV 84.1 04/15/2019   PLT 230 04/15/2019      Chemistry      Component Value Date/Time   NA 141 04/15/2019 1053   NA 144 07/03/2017 1241   K 4.0 04/15/2019 1053   K 3.8 07/03/2017 1241   CL 103 04/15/2019 1053   CO2 28 04/15/2019 1053   CO2 27 07/03/2017 1241   BUN 10 04/15/2019 1053   BUN  11.2 07/03/2017 1241   CREATININE 0.73 04/15/2019 1053   CREATININE 0.9 07/03/2017 1241      Component Value Date/Time   CALCIUM 8.9 04/15/2019 1053   CALCIUM 9.2 07/03/2017 1241   ALKPHOS 49 12/10/2017 0749   ALKPHOS 55 07/03/2017 1241   AST 25 04/15/2019 1053   AST 22 07/03/2017 1241   ALT 25 04/15/2019 1053   ALT 37 07/03/2017 1241   BILITOT 0.4 04/15/2019 1053   BILITOT 0.29 07/03/2017 1241       No results found for: LABCA2  No components found for: LABCA125  No results for input(s): INR in the last 168 hours.  Urinalysis    Component Value Date/Time   COLORURINE YELLOW 01/02/2019 1458   APPEARANCEUR CLEAR 01/02/2019 1458   LABSPEC 1.017 01/02/2019 1458   PHURINE < OR = 5.0 01/02/2019 1458   GLUCOSEU NEGATIVE 01/02/2019 1458   HGBUR NEGATIVE 01/02/2019 1458   BILIRUBINUR NEGATIVE 11/15/2016 1436   KETONESUR TRACE (A) 01/02/2019 1458   PROTEINUR NEGATIVE 01/02/2019 1458   NITRITE NEGATIVE 01/02/2019 1458   LEUKOCYTESUR NEGATIVE 01/02/2019 1458      ELIGIBLE FOR AVAILABLE RESEARCH PROTOCOL: no   STUDIES: Mammography results discussed with the patient  ASSESSMENT: 63 y.o. [place] woman status post right breast upper outer quadrant biopsy 04/18/2016 for a clinical T1b N0, stage IA invasive ductal carcinoma, grade 2, estrogen and progesterone receptor strongly positive, HER-2 nonamplified, with an MIB-1 of 5%.   (1) Status post right lumpectomy and sentinel lymph node sampling 05/18/2016 for a pT1c pN0, stage IA invasive ductal carcinoma, grade 1, with negative margins, Repeat HER-2 again negative  (2) Oncotype DX score  of 12 predicts a 10 year risk of recurrence outside the breast of 8% the patient's only systemic therapy is tamoxifen for 5 years. It also protects no benefit from adjuvant chemotherapy. He  (3) adjuvant radiation 07/06/2016-08/22/2016 Site/dose:   1) right breast / 45 Gy in 25 fractions, 2) right breast boost / 16 Gy in 8 fractions  (4)  started tamoxifen 09/27/2016   PLAN: Desiree Anderson is now a little over 3 years out from definitive surgery for her breast cancer with no evidence of disease recurrence.  This is very favorable.  She is doing fair on tamoxifen but does have significant hot flashes.  We are going to try venlafaxine, initially at 37.5 mg daily.  If she tolerates that well and wishes to intensify treatment we will go to 75 mg  She is very concerned about the changes she feels in the right breast.  We did just have mammography early July and everything in that area was fine but I am concerned that that area does seem to me to be in the right breast a little bit better defined than prior so we are going to go ahead and obtain a right breast ultrasound.  Expecting that to be benign she will return to see me in a year.  She knows to call for any other issues that may develop before then.  Tyger Oka, Valentino Hue, MD  07/09/19 12:26 PM Medical Oncology and Hematology [facility] [address] [place], [place] [address] Tel. [phone]    Fax. [phone]  I, Mal Misty am acting as a Neurosurgeon for Lowella Dell, MD.   I, Ruthann Cancer MD, have reviewed the above documentation for accuracy and completeness, and I agree with the above.

## 2019-07-09 ENCOUNTER — Inpatient Hospital Stay: Payer: Medicare Other | Attending: Oncology | Admitting: Oncology

## 2019-07-09 ENCOUNTER — Other Ambulatory Visit: Payer: Self-pay

## 2019-07-09 VITALS — BP 144/69 | HR 77 | Temp 98.0°F | Resp 18 | Ht 62.5 in | Wt 178.8 lb

## 2019-07-09 DIAGNOSIS — Z8711 Personal history of peptic ulcer disease: Secondary | ICD-10-CM | POA: Diagnosis not present

## 2019-07-09 DIAGNOSIS — Z8249 Family history of ischemic heart disease and other diseases of the circulatory system: Secondary | ICD-10-CM | POA: Diagnosis not present

## 2019-07-09 DIAGNOSIS — M797 Fibromyalgia: Secondary | ICD-10-CM | POA: Insufficient documentation

## 2019-07-09 DIAGNOSIS — Z170 Estrogen receptor positive status [ER+]: Secondary | ICD-10-CM | POA: Diagnosis not present

## 2019-07-09 DIAGNOSIS — Z801 Family history of malignant neoplasm of trachea, bronchus and lung: Secondary | ICD-10-CM | POA: Diagnosis not present

## 2019-07-09 DIAGNOSIS — Z8379 Family history of other diseases of the digestive system: Secondary | ICD-10-CM | POA: Diagnosis not present

## 2019-07-09 DIAGNOSIS — M25519 Pain in unspecified shoulder: Secondary | ICD-10-CM | POA: Diagnosis not present

## 2019-07-09 DIAGNOSIS — E039 Hypothyroidism, unspecified: Secondary | ICD-10-CM | POA: Insufficient documentation

## 2019-07-09 DIAGNOSIS — C50411 Malignant neoplasm of upper-outer quadrant of right female breast: Secondary | ICD-10-CM | POA: Diagnosis not present

## 2019-07-09 DIAGNOSIS — R232 Flushing: Secondary | ICD-10-CM | POA: Insufficient documentation

## 2019-07-09 DIAGNOSIS — R61 Generalized hyperhidrosis: Secondary | ICD-10-CM | POA: Insufficient documentation

## 2019-07-09 DIAGNOSIS — I10 Essential (primary) hypertension: Secondary | ICD-10-CM | POA: Insufficient documentation

## 2019-07-09 DIAGNOSIS — Z87891 Personal history of nicotine dependence: Secondary | ICD-10-CM | POA: Insufficient documentation

## 2019-07-09 DIAGNOSIS — Z833 Family history of diabetes mellitus: Secondary | ICD-10-CM | POA: Diagnosis not present

## 2019-07-09 DIAGNOSIS — Z79899 Other long term (current) drug therapy: Secondary | ICD-10-CM | POA: Diagnosis not present

## 2019-07-09 DIAGNOSIS — Z79810 Long term (current) use of selective estrogen receptor modulators (SERMs): Secondary | ICD-10-CM | POA: Diagnosis not present

## 2019-07-09 DIAGNOSIS — Z8371 Family history of colonic polyps: Secondary | ICD-10-CM | POA: Diagnosis not present

## 2019-07-09 MED ORDER — TAMOXIFEN CITRATE 20 MG PO TABS: 20.0000 mg | ORAL_TABLET | Freq: Every day | ORAL | 4 refills | Status: DC

## 2019-07-09 MED ORDER — VENLAFAXINE HCL ER 37.5 MG PO CP24: 37.5000 mg | ORAL_CAPSULE | Freq: Every day | ORAL | 4 refills | Status: DC

## 2019-07-10 ENCOUNTER — Telehealth: Payer: Self-pay | Admitting: Oncology

## 2019-07-10 NOTE — Telephone Encounter (Signed)
I left a message regarding schedule  

## 2019-07-16 ENCOUNTER — Encounter: Payer: Self-pay | Admitting: Internal Medicine

## 2019-07-16 NOTE — Patient Instructions (Signed)

## 2019-07-16 NOTE — Progress Notes (Signed)
History of Present Illness:      This very nice 63 y.o. MWF presents for 6 month follow up with HTN, HLD, Pre-Diabetes and Vitamin D Deficiency.  She has GERD controlled on her meds.  Patient has hx/o of  Lumpectomy  (04/2016) for Rt Breast cancer s/p ChemoRadiation and is followed on Tamoxifen for 5 years by Dr Jana Hakim.       Patient is on SS Disability since 2004 (age 13) for Chronic Pain Syndrome due to Fibromyalgia, DJD / DDD and Interstitial.           Patient is treated for HTN (2004) & BP has been controlled at home. Today's BP was initially slightly elevated & rechecked at goal - 134/74. Patient has had no complaints of any cardiac type chest pain, palpitations, dyspnea / orthopnea / PND, dizziness, claudication, or dependent edema.      Hyperlipidemia is controlled with diet & Rosuvastatin / Zetia. Patient denies myalgias or other med SE's. Current Lipids are at goal: Lab Results  Component Value Date   CHOL 157 07/17/2019   HDL 51 07/17/2019   LDLCALC 84 07/17/2019   TRIG 123 07/17/2019   CHOLHDL 3.1 07/17/2019       Also, the patient has history of PreDiabete (A1c 6.1% / 2011) and has had no symptoms of reactive hypoglycemia, diabetic polys, paresthesias or visual blurring.  Current A1c is near goal: Lab Results  Component Value Date   HGBA1C 5.8 (H) 07/17/2019        Patient was dx'd Hypothyroid in 2002 and has been on thyroid replacement ever since.       Further, the patient also has history of Vitamin D Deficiency ("11" / 2008) and supplements vitamin D without any suspected side-effects. Last vitamin D is at goal: Lab Results  Component Value Date   VD25OH 62 07/17/2019   Current Outpatient Medications on File Prior to Visit  Medication Sig  . ALPRAZolam (XANAX) 0.5 MG tablet TAKE 1/2-1 TABLET BY MOUTH 2-3 TIMES DAILY ONLY IF NEEDED FOR ANXIETY ATTACKS AND PLEASE TRY TO LIMIT TO 5 DAYS A WEEK TO AVOID ADDICTION  . cetirizine (ZYRTEC) 10 MG tablet Take 10 mg by  mouth daily.   . Cholecalciferol (VITAMIN D-3) 5000 UNITS TABS Take 5,000 Units by mouth 2 (two) times daily.   Marland Kitchen ezetimibe (ZETIA) 10 MG tablet Take 1 tablet daily for Cholesterol  . famotidine (PEPCID) 40 MG tablet Take 1 tablet (40 mg total) by mouth 2 (two) times daily. (Patient taking differently: Take 40 mg by mouth as needed. )  . fluticasone (FLONASE) 50 MCG/ACT nasal spray USE TWO SPRAY(S) IN EACH NOSTRIL ONCE DAILY AS NEEDED  . hyoscyamine (LEVSIN SL) 0.125 MG SL tablet DISSOLVE 1 TABLET IN MOUTH EVERY 6 HOURS AS NEEDED  . levothyroxine (SYNTHROID) 50 MCG tablet Take 1 tablet (50 mcg total) by mouth daily. on empty stomach.  . Multiple Vitamins-Minerals (GERITABS PO) Take 1 tablet by mouth daily. Reported on 06/08/2016  . potassium chloride SA (K-DUR) 20 MEQ tablet Take 1 tablet by mouth twice daily  . rosuvastatin (CRESTOR) 20 MG tablet Take 1 tablet daily for Cholesterol  . tamoxifen (NOLVADEX) 20 MG tablet Take 1 tablet (20 mg total) by mouth daily.  Marland Kitchen venlafaxine XR (EFFEXOR-XR) 37.5 MG 24 hr capsule Take 1 capsule (37.5 mg total) by mouth daily with breakfast.  . gabapentin (NEURONTIN) 600 MG tablet Take 1/2 to 1 tablet 2 to 3 x /  day as need to control pain   No current facility-administered medications on file prior to visit.    Allergies  Allergen Reactions  . Iodinated Diagnostic Agents Other (See Comments)    Unknown   . Lipitor [Atorvastatin] Other (See Comments)    Muscle and legs aches  . Shellfish Allergy Diarrhea and Nausea And Vomiting  . Betadine [Povidone Iodine] Rash   PMHx:   Past Medical History:  Diagnosis Date  . Anxiety   . Breast cancer ([place])   . Complication of anesthesia    hypotension  . DDD (degenerative disc disease), lumbar   . Erosion of vaginal mesh ([place])   . Fibromyalgia   . GERD (gastroesophageal reflux disease)   . History of gastric ulcer   . History of radiation therapy 07/06/16- 08/22/16   Right Breast  . Hypertension   .  Hypothyroidism   . Personal history of radiation therapy   . Rectocele    Immunization History  Administered Date(s) Administered  . Influenza Inj Mdck Quad With Preservative 09/04/2017, 09/30/2018  . Influenza Split 09/18/2013, 09/25/2014  . Influenza, Seasonal, Injecte, Preservative Fre 10/11/2015  . Influenza,inj,quad, With Preservative 08/28/2016, 08/27/2017  . Influenza-Unspecified 08/27/2016  . Pneumococcal-Unspecified 11/27/2000  . Tdap 03/16/2011   Cologard  - 03/05/2017 - Negative - due 3 yr f/u in Apr 2021  Last MGM - 05/28/2019  Past Surgical History:  Procedure Laterality Date  . BLADDER TACK  2003  . BREAST BIOPSY Right 04/18/2016  . BREAST LUMPECTOMY Right 05/18/2016  . Breast mass removal Left 1985   Benign  . CARDIOVASCULAR STRESS TEST  07-02-2013  DR DALTON MCLEAN   LOW RISK NUCLEAR STUDY WITH A SMALL, MILD APICAL SEPTAL FIXED PERFUSION DEFECT .  GIVEN NORMAL WALL FUNCTION , SUSPECT REPRESENTS ATTENUATION/  EF 74%/  NO ISCHEMIA  . CATARACT EXTRACTION W/ INTRAOCULAR LENS IMPLANT Left 01/2014  . CATARACT EXTRACTION W/ INTRAOCULAR LENS IMPLANT Right 12/2017   Dr. Manuella Ghazi  . CHOLECYSTECTOMY N/A 12/10/2017   Procedure: LAPAROSCOPIC CHOLECYSTECTOMY;  Surgeon: Rolm Bookbinder, MD;  Location: [facility];  Service: General;  Laterality: N/A;  . CYSTO/  HYDRODISTENTION/  BLADDER BX/   INSTILLATION THERAPY  08-29-2010  . EXCISION OF MESH N/A 03/23/2014   Procedure: EXCISION OF VAGINAL AND PERI-URETHRAL MESH, EXTRACTION FROM URETHRA TO APEX, insertion of XENFORM in the vaginal vault;  Surgeon: Ailene Rud, MD;  Location: [facility];  Service: Urology;  Laterality: N/A;  . EXCISION VAGINAL CYST N/A 04/06/2014   Procedure: VAGINAL SPECULUM EXAMINATION/POSSIBLE DRAINAGE OF HEMATOMA;  Surgeon: Ailene Rud, MD;  Location: [facility];  Service: Urology;  Laterality: N/A;  . INCONTINENCE SURGERY  2009  &  2005  . RADIOACTIVE SEED GUIDED  PARTIAL MASTECTOMY WITH AXILLARY SENTINEL LYMPH NODE BIOPSY Right 05/18/2016   Procedure: RADIOACTIVE SEED GUIDED RIGHT BREAST LUMPECTOMY WITH AXILLARY SENTINEL LYMPH NODE BIOPSY;  Surgeon: Rolm Bookbinder, MD;  Location: [facility];  Service: General;  Laterality: Right;  RADIOACTIVE SEED GUIDED RIGHT BREAST LUMPECTOMY WITH AXILLARY SENTINEL LYMPH NODE BIOPSY  . TRANSTHORACIC ECHOCARDIOGRAM  06-16-2013   GRADE I DIASTOLIC DYSFUNCTION/  EF 55-60%/  MILD AR/  TRIVIAL MR  &  TR  . VAGINAL HYSTERECTOMY  2000   w/  unilateral salpingoophorectomy  . VEIN BYPASS SURGERY  AGE 24   RIGHT LEG   FHx:    Reviewed / unchanged  SHx:    Reviewed / unchanged   Systems Review:  Constitutional: Denies  fever, chills, wt changes, headaches, insomnia, fatigue, night sweats, change in appetite. Eyes: Denies redness, blurred vision, diplopia, discharge, itchy, watery eyes.  ENT: Denies discharge, congestion, post nasal drip, epistaxis, sore throat, earache, hearing loss, dental pain, tinnitus, vertigo, sinus pain, snoring.  CV: Denies chest pain, palpitations, irregular heartbeat, syncope, dyspnea, diaphoresis, orthopnea, PND, claudication or edema. Respiratory: denies cough, dyspnea, DOE, pleurisy, hoarseness, laryngitis, wheezing.  Gastrointestinal: Denies dysphagia, odynophagia, heartburn, reflux, water brash, abdominal pain or cramps, nausea, vomiting, bloating, diarrhea, constipation, hematemesis, melena, hematochezia  or hemorrhoids. Genitourinary: Denies dysuria, frequency, urgency, nocturia, hesitancy, discharge, hematuria or flank pain. Musculoskeletal: Denies arthralgias, myalgias, stiffness, jt. swelling, pain, limping or strain/sprain.  Skin: Denies pruritus, rash, hives, warts, acne, eczema or change in skin lesion(s). Neuro: No weakness, tremor, incoordination, spasms, paresthesia or pain. Psychiatric: Denies confusion, memory loss or sensory loss. Endo: Denies change in weight,  skin or hair change.  Heme/Lymph: No excessive bleeding, bruising or enlarged lymph nodes.  Physical Exam  BP 134/74   Pulse 76   Temp (!) 97.5 F (36.4 C)   Resp 16   Ht 5' 2.5" (1.588 m)   Wt 178 lb 9.6 oz (81 kg)   BMI 32.15 kg/m   Appears over nourished, well groomed  and in no distress.  Eyes: PERRLA, EOMs, conjunctiva no swelling or erythema. Sinuses: No frontal/maxillary tenderness ENT/Mouth: EAC's clear, TM's nl w/o erythema, bulging. Nares clear w/o erythema, swelling, exudates. Oropharynx clear without erythema or exudates. Oral hygiene is good. Tongue normal, non obstructing. Hearing intact.  Neck: Supple. Thyroid not palpable. Car 2+/2+ without bruits, nodes or JVD. Chest: Respirations nl with BS clear & equal w/o rales, rhonchi, wheezing or stridor.  Cor: Heart sounds normal w/ regular rate and rhythm without sig. murmurs, gallops, clicks or rubs. Peripheral pulses normal and equal  without edema.  Abdomen: Soft & bowel sounds normal. Non-tender w/o guarding, rebound, hernias, masses or organomegaly.  Lymphatics: Unremarkable.  Musculoskeletal: Full ROM all peripheral extremities, joint stability, 5/5 strength and normal gait.  Skin: Warm, dry without exposed rashes, lesions or ecchymosis apparent.  Neuro: Cranial nerves intact, reflexes equal bilaterally. Sensory-motor testing grossly intact. Tendon reflexes grossly intact.  Pysch: Alert & oriented x 3.  Insight and judgement nl & appropriate. No ideations.  Assessment and Plan:  1. Essential hypertension  - Continue medication, monitor blood pressure at home.  - Continue DASH diet.  Reminder to go to the ER if any CP,  SOB, nausea, dizziness, severe HA, changes vision/speech.  - CBC with Differential/Platelet - COMPLETE METABOLIC PANEL WITH GFR - Magnesium - TSH  2. Hyperlipidemia, mixed  - Continue diet/meds, exercise,& lifestyle modifications.  - Continue monitor periodic cholesterol/liver & renal  functions   - Lipid panel - TSH  3. Abnormal glucose  - Continue diet, exercise  - Lifestyle modifications.  - Monitor appropriate labs.  - Hemoglobin A1c - Insulin, random  4. Vitamin D deficiency  - Continue supplementation.  - VITAMIN D 25 Hydroxyl  5. Prediabetes  - Hemoglobin A1c - Insulin, random  6. Medication management  - CBC with Differential/Platelet - COMPLETE METABOLIC PANEL WITH GFR - Magnesium - Lipid panel - TSH - Hemoglobin A1c - Insulin, random - VITAMIN D 25 Hydroxyl       Discussed  regular exercise, BP monitoring, weight control to achieve/maintain BMI less than 25 and discussed med and SE's. Recommended labs to assess and monitor clinical status with further disposition pending results of labs.  I discussed  the assessment and treatment plan with the patient. The patient was provided an opportunity to ask questions and all were answered. The patient agreed with the plan and demonstrated an understanding of the instructions.  I provided over 30 minutes of exam, counseling, chart review and  complex critical decision making.  Kirtland Bouchard, MD

## 2019-07-17 ENCOUNTER — Other Ambulatory Visit: Payer: Self-pay

## 2019-07-17 ENCOUNTER — Ambulatory Visit (INDEPENDENT_AMBULATORY_CARE_PROVIDER_SITE_OTHER): Payer: Medicare Other | Admitting: Internal Medicine

## 2019-07-17 VITALS — BP 134/74 | HR 76 | Temp 97.5°F | Resp 16 | Ht 62.5 in | Wt 178.6 lb

## 2019-07-17 DIAGNOSIS — E782 Mixed hyperlipidemia: Secondary | ICD-10-CM | POA: Diagnosis not present

## 2019-07-17 DIAGNOSIS — R7303 Prediabetes: Secondary | ICD-10-CM

## 2019-07-17 DIAGNOSIS — R7309 Other abnormal glucose: Secondary | ICD-10-CM

## 2019-07-17 DIAGNOSIS — I10 Essential (primary) hypertension: Secondary | ICD-10-CM

## 2019-07-17 DIAGNOSIS — Z79899 Other long term (current) drug therapy: Secondary | ICD-10-CM

## 2019-07-17 DIAGNOSIS — E559 Vitamin D deficiency, unspecified: Secondary | ICD-10-CM | POA: Diagnosis not present

## 2019-07-18 LAB — COMPLETE METABOLIC PANEL WITH GFR
AG Ratio: 1.8 (calc) (ref 1.0–2.5)
ALT: 23 U/L (ref 6–29)
AST: 19 U/L (ref 10–35)
Albumin: 4 g/dL (ref 3.6–5.1)
Alkaline phosphatase (APISO): 56 U/L (ref 37–153)
BUN: 11 mg/dL (ref 7–25)
CO2: 26 mmol/L (ref 20–32)
Calcium: 9 mg/dL (ref 8.6–10.4)
Chloride: 101 mmol/L (ref 98–110)
Creat: 0.68 mg/dL (ref 0.50–0.99)
GFR, Est African American: 109 mL/min/{1.73_m2} (ref >=60)
GFR, Est Non African American: 94 mL/min/{1.73_m2} (ref >=60)
Globulin: 2.2 g/dL (calc) (ref 1.9–3.7)
Glucose, Bld: 96 mg/dL (ref 65–99)
Potassium: 4 mmol/L (ref 3.5–5.3)
Sodium: 139 mmol/L (ref 135–146)
Total Bilirubin: 0.3 mg/dL (ref 0.2–1.2)
Total Protein: 6.2 g/dL (ref 6.1–8.1)

## 2019-07-18 LAB — CBC WITH DIFFERENTIAL/PLATELET
Absolute Monocytes: 565 cells/uL (ref 200–950)
Basophils Absolute: 57 cells/uL (ref 0–200)
Basophils Relative: 0.5 %
Eosinophils Absolute: 102 cells/uL (ref 15–500)
Eosinophils Relative: 0.9 %
HCT: 42.5 % (ref 35.0–45.0)
Hemoglobin: 13.6 g/dL (ref 11.7–15.5)
Lymphs Abs: 6000 cells/uL — ABNORMAL HIGH (ref 850–3900)
MCH: 27.4 pg (ref 27.0–33.0)
MCHC: 32 g/dL (ref 32.0–36.0)
MCV: 85.7 fL (ref 80.0–100.0)
MPV: 10.5 fL (ref 7.5–12.5)
Monocytes Relative: 5 %
Neutro Abs: 4577 cells/uL (ref 1500–7800)
Neutrophils Relative %: 40.5 %
Platelets: 287 10*3/uL (ref 140–400)
RBC: 4.96 10*6/uL (ref 3.80–5.10)
RDW: 13.1 % (ref 11.0–15.0)
Total Lymphocyte: 53.1 %
WBC: 11.3 10*3/uL — ABNORMAL HIGH (ref 3.8–10.8)

## 2019-07-18 LAB — HEMOGLOBIN A1C
Hgb A1c MFr Bld: 5.8 % of total Hgb — ABNORMAL HIGH (ref <5.7)
Mean Plasma Glucose: 120 (calc)
eAG (mmol/L): 6.6 (calc)

## 2019-07-18 LAB — MAGNESIUM: Magnesium: 2 mg/dL (ref 1.5–2.5)

## 2019-07-18 LAB — LIPID PANEL
Cholesterol: 157 mg/dL (ref <200)
HDL: 51 mg/dL (ref >=50)
LDL Cholesterol (Calc): 84 mg/dL (calc)
Non-HDL Cholesterol (Calc): 106 mg/dL (calc) (ref <130)
Total CHOL/HDL Ratio: 3.1 (calc) (ref <5.0)
Triglycerides: 123 mg/dL (ref <150)

## 2019-07-18 LAB — TSH: TSH: 55.71 mIU/L — ABNORMAL HIGH (ref 0.40–4.50)

## 2019-07-18 LAB — INSULIN, RANDOM: Insulin: 21.9 u[IU]/mL — ABNORMAL HIGH

## 2019-07-18 LAB — VITAMIN D 25 HYDROXY (VIT D DEFICIENCY, FRACTURES): Vit D, 25-Hydroxy: 91 ng/mL (ref 30–100)

## 2019-07-19 ENCOUNTER — Other Ambulatory Visit: Payer: Self-pay | Admitting: Internal Medicine

## 2019-07-19 DIAGNOSIS — E039 Hypothyroidism, unspecified: Secondary | ICD-10-CM

## 2019-07-20 ENCOUNTER — Encounter: Payer: Self-pay | Admitting: Internal Medicine

## 2019-07-22 ENCOUNTER — Ambulatory Visit
Admission: RE | Admit: 2019-07-22 | Discharge: 2019-07-22 | Disposition: A | Discharge status: Home / Self Care | Payer: Medicare Other | Source: Ambulatory Visit | Attending: Oncology | Admitting: Oncology

## 2019-07-22 ENCOUNTER — Other Ambulatory Visit: Payer: Self-pay

## 2019-07-22 DIAGNOSIS — Z170 Estrogen receptor positive status [ER+]: Secondary | ICD-10-CM

## 2019-07-22 DIAGNOSIS — N6011 Diffuse cystic mastopathy of right breast: Secondary | ICD-10-CM | POA: Diagnosis not present

## 2019-07-22 DIAGNOSIS — C50411 Malignant neoplasm of upper-outer quadrant of right female breast: Secondary | ICD-10-CM

## 2019-07-31 ENCOUNTER — Ambulatory Visit (INDEPENDENT_AMBULATORY_CARE_PROVIDER_SITE_OTHER): Payer: Medicare Other

## 2019-07-31 ENCOUNTER — Other Ambulatory Visit: Payer: Self-pay

## 2019-07-31 DIAGNOSIS — E039 Hypothyroidism, unspecified: Secondary | ICD-10-CM | POA: Diagnosis not present

## 2019-07-31 NOTE — Progress Notes (Signed)
Patient presents to the office for a nurse visit to have labs done. Patient states that she has been taking Levothyroxine, 124mcg daily instructed. Also takes famotidine around 2pm or later every day. Patient also states that she does not take a biotin supplement or anything that has it in it. Vitals taken and recorded.

## 2019-08-01 LAB — TSH: TSH: 4.98 mIU/L — ABNORMAL HIGH (ref 0.40–4.50)

## 2019-10-03 IMAGING — US US ABDOMEN COMPLETE
1 series · 13 of 25 positions shown · non-contrast
Comparison: 07/03/2013

CLINICAL DATA: Right upper quadrant pain for 1 month. Elevated
liver function tests.

EXAM:
ABDOMEN ULTRASOUND COMPLETE

[Series 1: us abdomen complete · 0.23mm/px · 13 of 58 slices shown]
[im 1/58]
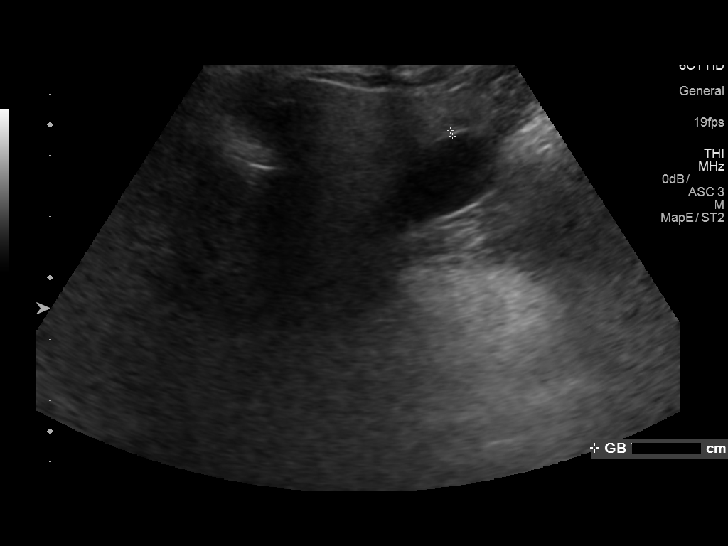
[im 5/58]
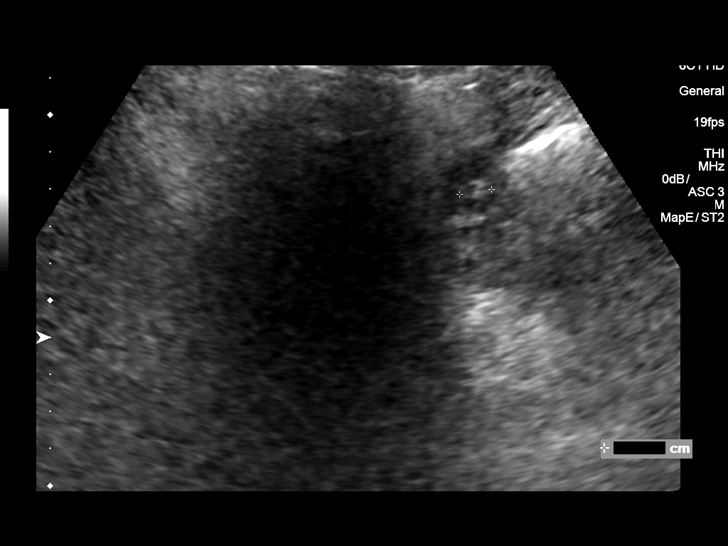
[im 10/58]
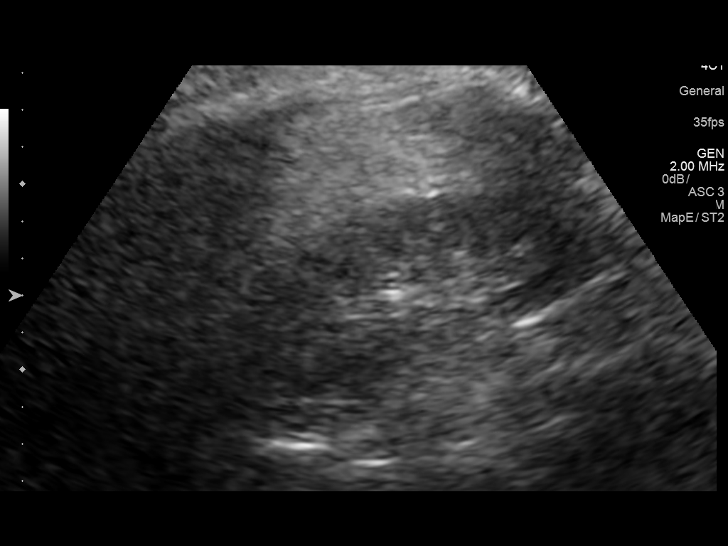
[im 15/58]
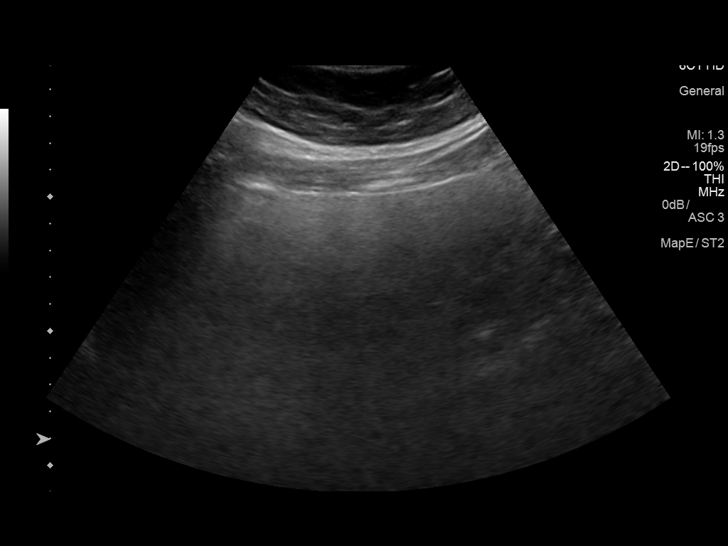
[im 20/58]
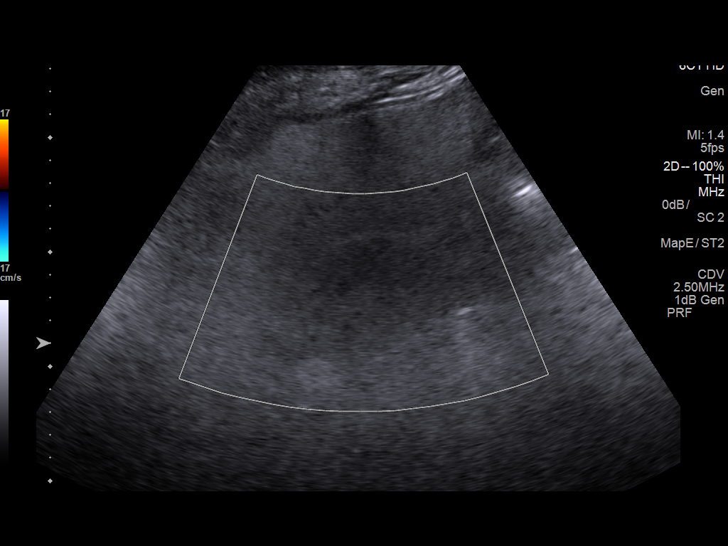
[im 24/58]
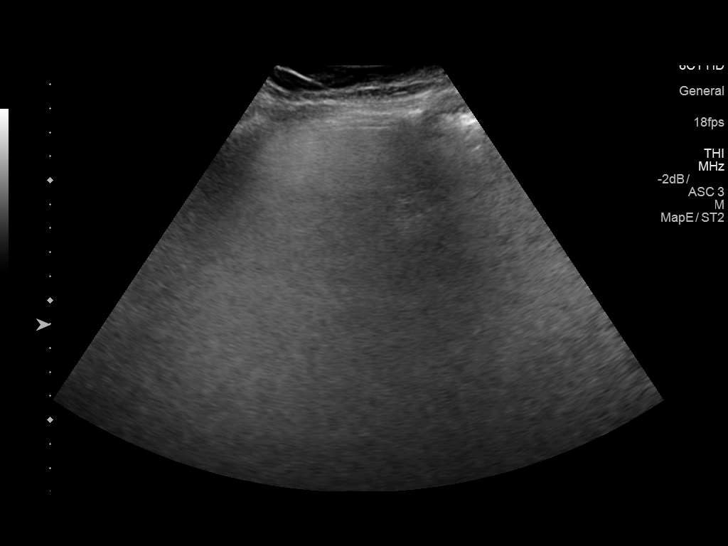
[im 29/58]
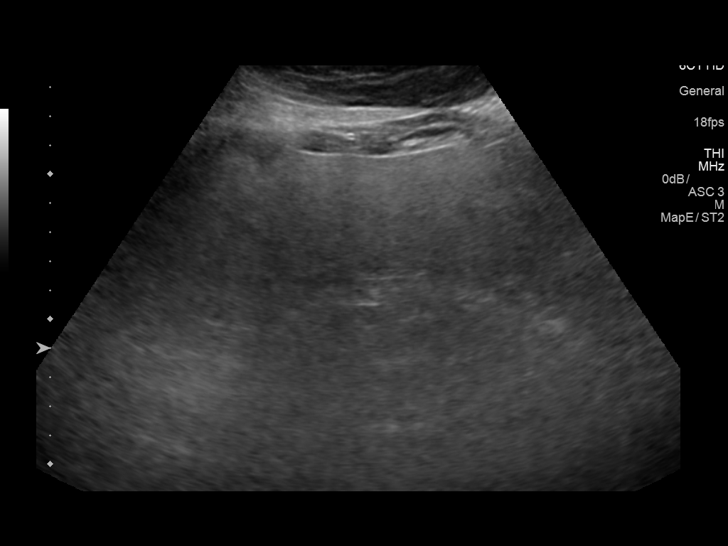
[im 34/58]
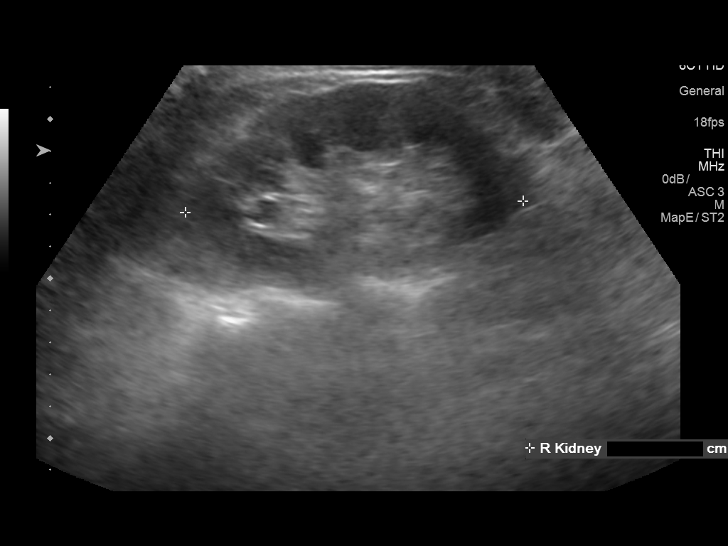
[im 39/58]
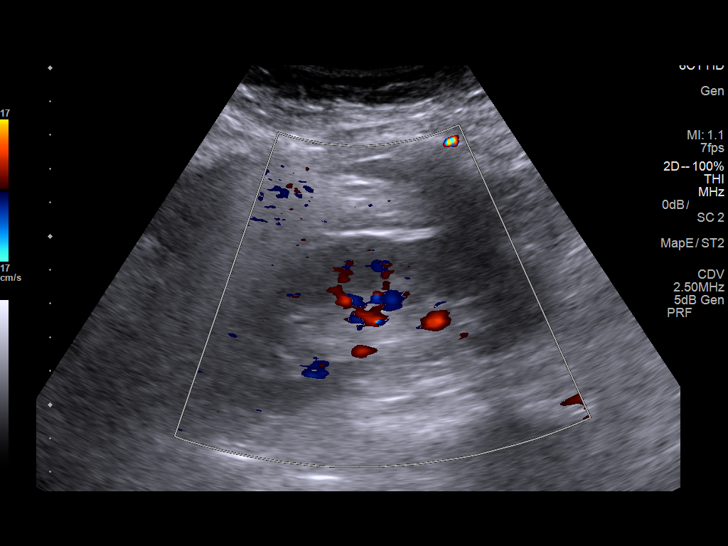
[im 43/58]
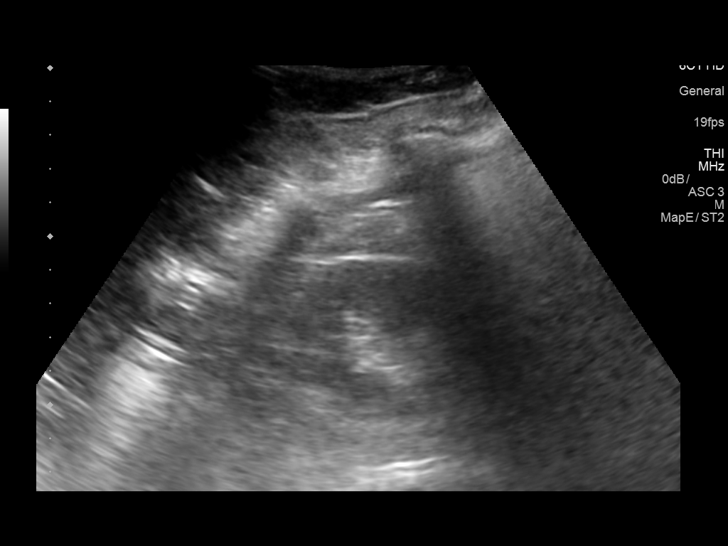
[im 48/58]
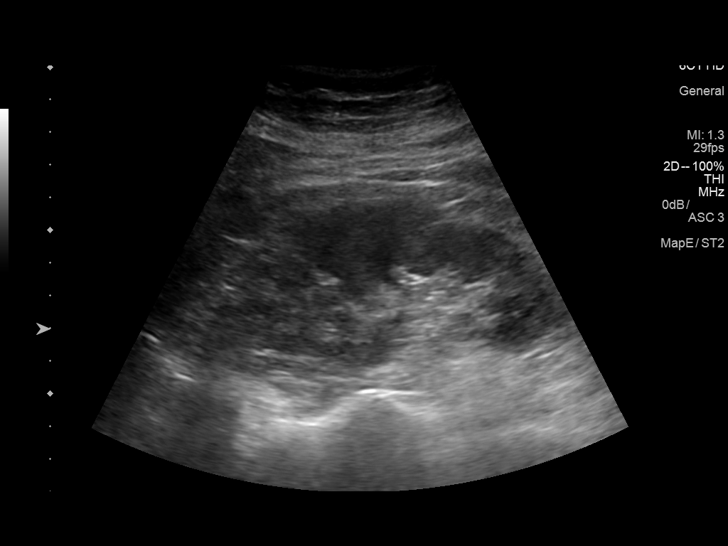
[im 53/58]
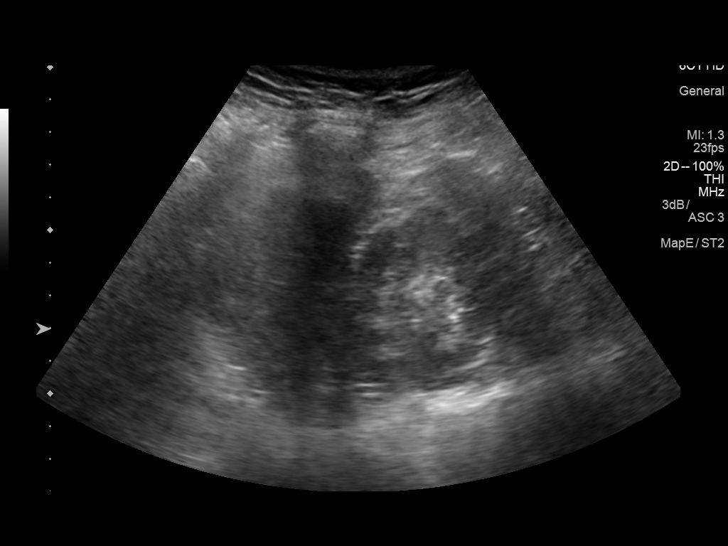
[im 58/58]
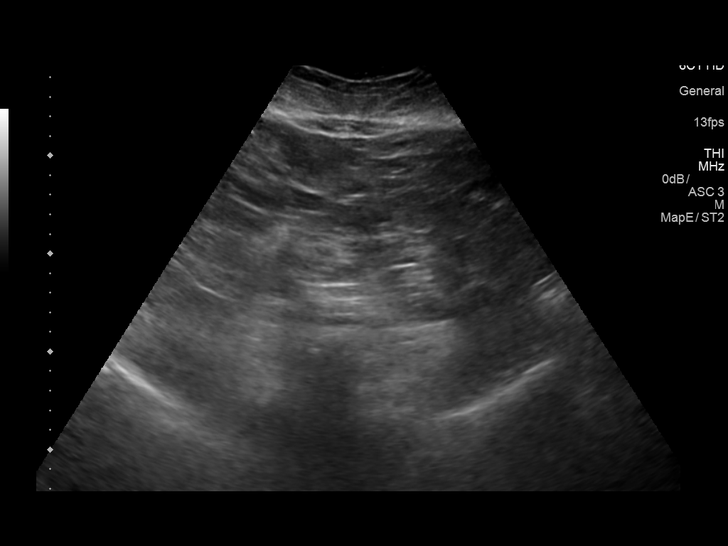

[13 of 25 positions shown; findings below may reference images not displayed]

FINDINGS: Gallbladder: 1 cm hyperechoic structure is seen in the gallbladder
fundus which shows no evidence of mobility or acoustic shadowing
this shows no significant change since previous study and most
likely represents a gallbladder polyp. No definite shadowing
gallstones identified. No evidence of gallbladder wall thickening or
pericholecystic fluid.

Common bile duct: Diameter: Not well visualized due to hepatic
steatosis and patient habitus.

Liver: Diffusely increased echogenicity of the hepatic parenchyma,
consistent with hepatic steatosis. No focal mass lesion identified.
Minimal perihepatic ascites noted. Portal vein is not visualized due
to hepatic steatosis.

IVC: No abnormality visualized.

Pancreas: Visualized portion unremarkable.

Spleen: Size and appearance within normal limits.

Right Kidney: Length: 10.6 cm. Echogenicity within normal limits. No
mass or hydronephrosis visualized.

Left Kidney: Length: 10.4 cm. Echogenicity within normal limits. No
mass or hydronephrosis visualized.

Abdominal aorta: Not visualized due to patient habitus and overlying
bowel gas .

Other findings: None.
IMPRESSION: Probable 1 cm gallbladder polyp, similar to previous study in [id number].
No sonographic signs of cholecystitis or biliary dilatation.

Hepatic steatosis and minimal perihepatic ascites.

## 2019-10-20 ENCOUNTER — Other Ambulatory Visit: Payer: Self-pay | Admitting: Internal Medicine

## 2019-10-20 NOTE — Patient Instructions (Addendum)
We will contact you in 1-3 days with your lab results.  We have sent in Cipro and Flagyl to help with your diverticulitis flare.  Check your blood pressure twice a day once in the morning and also in the evening.  Write these down.  Goal for you is 130/80 or less.  If it is 150/90 or higher daily please let us know.  You can try samples of Myrbetriq '25mg'$  once a night.  See if this helps with your urinary symptoms at night.     Vit D  & Vit C 1,000 mg   are recommended to help protect  against the Covid-19 and other Corona viruses.    Also it's recommended  to take  Zinc 50 mg  to help  protect against the Covid-19   and best place to get  is also on [email or web address]  and don't pay more than 6-8 cents /pill !  ================================ Coronavirus (COVID-19) Are you at risk?  Are you at risk for the Coronavirus (COVID-19)?  To be considered HIGH RISK for Coronavirus (COVID-19), you have to meet the following criteria:  . Traveled to [place], [place], [place], [place] or [place]; or in the [place] to [place], [place], [place]  . or [place]; and have fever, cough, and shortness of breath within the last 2 weeks of travel OR . Been in close contact with a person diagnosed with COVID-19 within the last 2 weeks and have  . fever, cough,and shortness of breath .  . IF YOU DO NOT MEET THESE CRITERIA, YOU ARE CONSIDERED LOW RISK FOR COVID-19.  What to do if you are HIGH RISK for COVID-19?  Marland Kitchen If you are having a medical emergency, call 911. . Seek medical care right away. Before you go to a doctor's office, urgent care or emergency department, .  call ahead and tell them about your recent travel, contact with someone diagnosed with COVID-19  .  and your symptoms.  . You should receive instructions from your physician's office regarding next steps of care.  . When you arrive at healthcare provider, tell the healthcare staff immediately you have returned from   . visiting [place], [place], [place], [place] or [place]; or traveled in the [place] to [place], [place],  . [place] or [place] in the last two weeks or you have been in close contact with a person diagnosed with  . COVID-19 in the last 2 weeks.   . Tell the health care staff about your symptoms: fever, cough and shortness of breath. . After you have been seen by a medical provider, you will be either: o Tested for (COVID-19) and discharged home on quarantine except to seek medical care if  o symptoms worsen, and asked to  - Stay home and avoid contact with others until you get your results (4-5 days)  - Avoid travel on public transportation if possible (such as bus, train, or airplane) or o Sent to the Emergency Department by EMS for evaluation, COVID-19 testing  and  o possible admission depending on your condition and test results.  What to do if you are LOW RISK for COVID-19?  Reduce your risk of any infection by using the same precautions used for avoiding the common cold or flu:  Marland Kitchen Wash your hands often with soap and warm water for at least 20 seconds.  If soap and water are not readily available,  . use an alcohol-based hand sanitizer with at least  60% alcohol.  . If coughing or sneezing, cover your mouth and nose by coughing or sneezing into the elbow areas of your shirt or coat, .  into a tissue or into your sleeve (not your hands). . Avoid shaking hands with others and consider head nods or verbal greetings only. . Avoid touching your eyes, nose, or mouth with unwashed hands.  . Avoid close contact with people who are sick. . Avoid places or events with large numbers of people in one location, like concerts or sporting events. . Carefully consider travel plans you have or are making. . If you are planning any travel outside or inside the [place], visit the CDC's Travelers' Health webpage for the latest health notices. . If you have some symptoms but not all symptoms,  continue to monitor at home and seek medical attention  . if your symptoms worsen. . If you are having a medical emergency, call 911. >>>>>>>>>>>>>>>>>>>>>>> Preventive Care for Adults  A healthy lifestyle and preventive care can promote health and wellness. Preventive health guidelines for women include the following key practices.  A routine yearly physical is a good way to check with your health care provider about your health and preventive screening. It is a chance to share any concerns and updates on your health and to receive a thorough exam.  Visit your dentist for a routine exam and preventive care every 6 months. Brush your teeth twice a day and floss once a day. Good oral hygiene prevents tooth decay and gum disease.  The frequency of eye exams is based on your age, health, family medical history, use of contact lenses, and other factors. Follow your health care provider's recommendations for frequency of eye exams.  Eat a healthy diet. Foods like vegetables, fruits, whole grains, low-fat dairy products, and lean protein foods contain the nutrients you need without too many calories. Decrease your intake of foods high in solid fats, added sugars, and salt. Eat the right amount of calories for you. Get information about a proper diet from your health care provider, if necessary.  Regular physical exercise is one of the most important things you can do for your health. Most adults should get at least 150 minutes of moderate-intensity exercise (any activity that increases your heart rate and causes you to sweat) each week. In addition, most adults need muscle-strengthening exercises on 2 or more days a week.  Maintain a healthy weight. The body mass index (BMI) is a screening tool to identify possible weight problems. It provides an estimate of body fat based on height and weight. Your health care provider can find your BMI and can help you achieve or maintain a healthy weight. For adults  20 years and older:  A BMI below 18.5 is considered underweight.  A BMI of 18.5 to 24.9 is normal.  A BMI of 25 to 29.9 is considered overweight.  A BMI of 30 and above is considered obese.  Maintain normal blood lipids and cholesterol levels by exercising and minimizing your intake of saturated fat. Eat a balanced diet with plenty of fruit and vegetables. Blood tests for lipids and cholesterol should begin at age 44 and be repeated every 5 years. If your lipid or cholesterol levels are high, you are over 50, or you are at high risk for heart disease, you may need your cholesterol levels checked more frequently. Ongoing high lipid and cholesterol levels should be treated with medicines if diet and exercise are not working.  If you  smoke, find out from your health care provider how to quit. If you do not use tobacco, do not start.  Lung cancer screening is recommended for adults aged 9-80 years who are at high risk for developing lung cancer because of a history of smoking. A yearly low-dose CT scan of the lungs is recommended for people who have at least a 30-pack-year history of smoking and are a current smoker or have quit within the past 15 years. A pack year of smoking is smoking an average of 1 pack of cigarettes a day for 1 year (for example: 1 pack a day for 30 years or 2 packs a day for 15 years). Yearly screening should continue until the smoker has stopped smoking for at least 15 years. Yearly screening should be stopped for people who develop a health problem that would prevent them from having lung cancer treatment.  High blood pressure causes heart disease and increases the risk of stroke. Your blood pressure should be checked at least every 1 to 2 years. Ongoing high blood pressure should be treated with medicines if weight loss and exercise do not work.  If you are 88-54 years old, ask your health care provider if you should take aspirin to prevent strokes.  Diabetes screening  involves taking a blood sample to check your fasting blood sugar level. This should be done once every 3 years, after age 73, if you are within normal weight and without risk factors for diabetes. Testing should be considered at a younger age or be carried out more frequently if you are overweight and have at least 1 risk factor for diabetes.  Breast cancer screening is essential preventive care for women. You should practice "breast self-awareness." This means understanding the normal appearance and feel of your breasts and may include breast self-examination. Any changes detected, no matter how small, should be reported to a health care provider. Women in their 37s and 30s should have a clinical breast exam (CBE) by a health care provider as part of a regular health exam every 1 to 3 years. After age 66, women should have a CBE every year. Starting at age 72, women should consider having a mammogram (breast X-ray test) every year. Women who have a family history of breast cancer should talk to their health care provider about genetic screening. Women at a high risk of breast cancer should talk to their health care providers about having an MRI and a mammogram every year.  Breast cancer gene (BRCA)-related cancer risk assessment is recommended for women who have family members with BRCA-related cancers. BRCA-related cancers include breast, ovarian, tubal, and peritoneal cancers. Having family members with these cancers may be associated with an increased risk for harmful changes (mutations) in the breast cancer genes BRCA1 and BRCA2. Results of the assessment will determine the need for genetic counseling and BRCA1 and BRCA2 testing.  Routine pelvic exams to screen for cancer are no longer recommended for nonpregnant women who are considered low risk for cancer of the pelvic organs (ovaries, uterus, and vagina) and who do not have symptoms. Ask your health care provider if a screening pelvic exam is right for  you.  If you have had past treatment for cervical cancer or a condition that could lead to cancer, you need Pap tests and screening for cancer for at least 20 years after your treatment. If Pap tests have been discontinued, your risk factors (such as having a new sexual partner) need to be reassessed to  determine if screening should be resumed. Some women have medical problems that increase the chance of getting cervical cancer. In these cases, your health care provider may recommend more frequent screening and Pap tests.  Colorectal cancer can be detected and often prevented. Most routine colorectal cancer screening begins at the age of 33 years and continues through age 51 years. However, your health care provider may recommend screening at an earlier age if you have risk factors for colon cancer. On a yearly basis, your health care provider may provide home test kits to check for hidden blood in the stool. Use of a small camera at the end of a tube, to directly examine the colon (sigmoidoscopy or colonoscopy), can detect the earliest forms of colorectal cancer. Talk to your health care provider about this at age 20, when routine screening begins.  Direct exam of the colon should be repeated every 5-10 years through age 2 years, unless early forms of pre-cancerous polyps or small growths are found.  Hepatitis C blood testing is recommended for all people born from [birth year] through 1965 and any individual with known risks for hepatitis C.  Pra  Osteoporosis is a disease in which the bones lose minerals and strength with aging. This can result in serious bone fractures or breaks. The risk of osteoporosis can be identified using a bone density scan. Women ages 47 years and over and women at risk for fractures or osteoporosis should discuss screening with their health care providers. Ask your health care provider whether you should take a calcium supplement or vitamin D to reduce the rate of  osteoporosis.  Menopause can be associated with physical symptoms and risks. Hormone replacement therapy is available to decrease symptoms and risks. You should talk to your health care provider about whether hormone replacement therapy is right for you.  Use sunscreen. Apply sunscreen liberally and repeatedly throughout the day. You should seek shade when your shadow is shorter than you. Protect yourself by wearing long sleeves, pants, a wide-brimmed hat, and sunglasses year round, whenever you are outdoors.  Once a month, do a whole body skin exam, using a mirror to look at the skin on your back. Tell your health care provider of new moles, moles that have irregular borders, moles that are larger than a pencil eraser, or moles that have changed in shape or color.  Stay current with required vaccines (immunizations).  Influenza vaccine. All adults should be immunized every year.  Tetanus, diphtheria, and acellular pertussis (Td, Tdap) vaccine. Pregnant women should receive 1 dose of Tdap vaccine during each pregnancy. The dose should be obtained regardless of the length of time since the last dose. Immunization is preferred during the 27th-36th week of gestation. An adult who has not previously received Tdap or who does not know her vaccine status should receive 1 dose of Tdap. This initial dose should be followed by tetanus and diphtheria toxoids (Td) booster doses every 10 years. Adults with an unknown or incomplete history of completing a 3-dose immunization series with Td-containing vaccines should begin or complete a primary immunization series including a Tdap dose. Adults should receive a Td booster every 10 years.  Varicella vaccine. An adult without evidence of immunity to varicella should receive 2 doses or a second dose if she has previously received 1 dose. Pregnant females who do not have evidence of immunity should receive the first dose after pregnancy. This first dose should be  obtained before leaving the health care facility. The second  dose should be obtained 4-8 weeks after the first dose.  Human papillomavirus (HPV) vaccine. Females aged 13-26 years who have not received the vaccine previously should obtain the 3-dose series. The vaccine is not recommended for use in pregnant females. However, pregnancy testing is not needed before receiving a dose. If a female is found to be pregnant after receiving a dose, no treatment is needed. In that case, the remaining doses should be delayed until after the pregnancy. Immunization is recommended for any person with an immunocompromised condition through the age of 9 years if she did not get any or all doses earlier. During the 3-dose series, the second dose should be obtained 4-8 weeks after the first dose. The third dose should be obtained 24 weeks after the first dose and 16 weeks after the second dose.  Zoster vaccine. One dose is recommended for adults aged 67 years or older unless certain conditions are present.  Measles, mumps, and rubella (MMR) vaccine. Adults born before [birth year] generally are considered immune to measles and mumps. Adults born in [birth year] or later should have 1 or more doses of MMR vaccine unless there is a contraindication to the vaccine or there is laboratory evidence of immunity to each of the three diseases. A routine second dose of MMR vaccine should be obtained at least 28 days after the first dose for students attending postsecondary schools, health care workers, or international travelers. People who received inactivated measles vaccine or an unknown type of measles vaccine during 1963-1967 should receive 2 doses of MMR vaccine. People who received inactivated mumps vaccine or an unknown type of mumps vaccine before 1979 and are at high risk for mumps infection should consider immunization with 2 doses of MMR vaccine. For females of childbearing age, rubella immunity should be determined. If there is no evidence  of immunity, females who are not pregnant should be vaccinated. If there is no evidence of immunity, females who are pregnant should delay immunization until after pregnancy. Unvaccinated health care workers born before [birth year] who lack laboratory evidence of measles, mumps, or rubella immunity or laboratory confirmation of disease should consider measles and mumps immunization with 2 doses of MMR vaccine or rubella immunization with 1 dose of MMR vaccine.  Pneumococcal 13-valent conjugate (PCV13) vaccine. When indicated, a person who is uncertain of her immunization history and has no record of immunization should receive the PCV13 vaccine. An adult aged 44 years or older who has certain medical conditions and has not been previously immunized should receive 1 dose of PCV13 vaccine. This PCV13 should be followed with a dose of pneumococcal polysaccharide (PPSV23) vaccine. The PPSV23 vaccine dose should be obtained at least 1 or more year(s) after the dose of PCV13 vaccine. An adult aged 46 years or older who has certain medical conditions and previously received 1 or more doses of PPSV23 vaccine should receive 1 dose of PCV13. The PCV13 vaccine dose should be obtained 1 or more years after the last PPSV23 vaccine dose.    Pneumococcal polysaccharide (PPSV23) vaccine. When PCV13 is also indicated, PCV13 should be obtained first. All adults aged 80 years and older should be immunized. An adult younger than age 32 years who has certain medical conditions should be immunized. Any person who resides in a nursing home or long-term care facility should be immunized. An adult smoker should be immunized. People with an immunocompromised condition and certain other conditions should receive both PCV13 and PPSV23 vaccines. People with human immunodeficiency virus (HIV) infection  should be immunized as soon as possible after diagnosis. Immunization during chemotherapy or radiation therapy should be avoided. Routine use of  PPSV23 vaccine is not recommended for American Indians, [place] Natives, or people younger than 65 years unless there are medical conditions that require PPSV23 vaccine. When indicated, people who have unknown immunization and have no record of immunization should receive PPSV23 vaccine. One-time revaccination 5 years after the first dose of PPSV23 is recommended for people aged 19-64 years who have chronic kidney failure, nephrotic syndrome, asplenia, or immunocompromised conditions. People who received 1-2 doses of PPSV23 before age 25 years should receive another dose of PPSV23 vaccine at age 7 years or later if at least 5 years have passed since the previous dose. Doses of PPSV23 are not needed for people immunized with PPSV23 at or after age 80 years.  Preventive Services / Frequency   Ages 50 to 81 years  Blood pressure check.  Lipid and cholesterol check.  Lung cancer screening. / Every year if you are aged 20-80 years and have a 30-pack-year history of smoking and currently smoke or have quit within the past 15 years. Yearly screening is stopped once you have quit smoking for at least 15 years or develop a health problem that would prevent you from having lung cancer treatment.  Clinical breast exam.** / Every year after age 54 years.   BRCA-related cancer risk assessment.** / For women who have family members with a BRCA-related cancer (breast, ovarian, tubal, or peritoneal cancers).  Mammogram.** / Every year beginning at age 35 years and continuing for as long as you are in good health. Consult with your health care provider.  Pap test.** / Every 3 years starting at age 25 years through age 30 or 24 years with a history of 3 consecutive normal Pap tests.  HPV screening.** / Every 3 years from ages 46 years through ages 70 to 57 years with a history of 3 consecutive normal Pap tests.  Fecal occult blood test (FOBT) of stool. / Every year beginning at age 5 years and continuing until  age 58 years. You may not need to do this test if you get a colonoscopy every 10 years.  Flexible sigmoidoscopy or colonoscopy.** / Every 5 years for a flexible sigmoidoscopy or every 10 years for a colonoscopy beginning at age 68 years and continuing until age 52 years.  Hepatitis C blood test.** / For all people born from [birth year] through 1963 and any individual with known risks for hepatitis C.  Skin self-exam. / Monthly.  Influenza vaccine. / Every year.  Tetanus, diphtheria, and acellular pertussis (Tdap/Td) vaccine.** / Consult your health care provider. Pregnant women should receive 1 dose of Tdap vaccine during each pregnancy. 1 dose of Td every 10 years.  Varicella vaccine.** / Consult your health care provider. Pregnant females who do not have evidence of immunity should receive the first dose after pregnancy.  Zoster vaccine.** / 1 dose for adults aged 13 years or older.  Pneumococcal 13-valent conjugate (PCV13) vaccine.** / Consult your health care provider.  Pneumococcal polysaccharide (PPSV23) vaccine.** / 1 to 2 doses if you smoke cigarettes or if you have certain conditions.  Meningococcal vaccine.** / Consult your health care provider.  Hepatitis A vaccine.** / Consult your health care provider.  Hepatitis B vaccine.** / Consult your health care provider. Screening for abdominal aortic aneurysm (AAA)  by ultrasound is recommended for people over 50 who have history of high blood pressure or who are current  or former smokers. ++++++++++++++++++ Recommend Adult Low Dose Aspirin or  coated  Aspirin 81 mg daily  To reduce risk of Colon Cancer 40 %,  Skin Cancer 26 % ,  Melanoma 46%  and  Pancreatic cancer 60% +++++++++++++++++++ Vitamin D goal  is between 70-100.  Please make sure that you are taking your Vitamin D as directed.  It is very important as a natural anti-inflammatory  helping hair, skin, and nails, as well as reducing stroke and heart attack risk.  It  helps your bones and helps with mood. It also decreases numerous cancer risks so please take it as directed.  Low Vit D is associated with a 200-300% higher risk for CANCER  and 200-300% higher risk for HEART   ATTACK  &  STROKE.   .....................................Marland Kitchen It is also associated with higher death rate at younger ages,  autoimmune diseases like Rheumatoid arthritis, Lupus, Multiple Sclerosis.    Also many other serious conditions, like depression, Alzheimer's Dementia, infertility, muscle aches, fatigue, fibromyalgia - just to name a few. ++++++++++++++++++ Recommend the book "The END of DIETING" by Dr Excell Seltzer  & the book "The END of DIABETES " by Dr Excell Seltzer At [email or web address] - get book & Audio CD's    Being diabetic has a  300% increased risk for heart attack, stroke, cancer, and alzheimer- type vascular dementia. It is very important that you work harder with diet by avoiding all foods that are white. Avoid white rice (brown & wild rice is OK), white potatoes (sweetpotatoes in moderation is OK), White bread or wheat bread or anything made out of white flour like bagels, donuts, rolls, buns, biscuits, cakes, pastries, cookies, pizza crust, and pasta (made from white flour & egg whites) - vegetarian pasta or spinach or wheat pasta is OK. Multigrain breads like Arnold's or [employer], or multigrain sandwich thins or flatbreads.  Diet, exercise and weight loss can reverse and cure diabetes in the early stages.  Diet, exercise and weight loss is very important in the control and prevention of complications of diabetes which affects every system in your body, ie. Brain - dementia/stroke, eyes - glaucoma/blindness, heart - heart attack/heart failure, kidneys - dialysis, stomach - gastric paralysis, intestines - malabsorption, nerves - severe painful neuritis, circulation - gangrene & loss of a leg(s), and finally cancer and Alzheimers.    I recommend avoid fried & greasy foods,   sweets/candy, white rice (brown or wild rice or Quinoa is OK), white potatoes (sweet potatoes are OK) - anything made from white flour - bagels, doughnuts, rolls, buns, biscuits,white and wheat breads, pizza crust and traditional pasta made of white flour & egg white(vegetarian pasta or spinach or wheat pasta is OK).  Multi-grain bread is OK - like multi-grain flat bread or sandwich thins. Avoid alcohol in excess. Exercise is also important.    Eat all the vegetables you want - avoid meat, especially red meat and dairy - especially cheese.  Cheese is the most concentrated form of trans-fats which is the worst thing to clog up our arteries. Veggie cheese is OK which can be found in the fresh produce section at [employer] or [employer] or [place]  ++++++++++++++++++++++ DASH Eating Plan  DASH stands for "Dietary Approaches to Stop Hypertension."   The DASH eating plan is a healthy eating plan that has been shown to reduce high blood pressure (hypertension). Additional health benefits may include reducing the risk of type 2 diabetes mellitus, heart disease, and stroke. The  DASH eating plan may also help with weight loss. WHAT DO I NEED TO KNOW ABOUT THE DASH EATING PLAN? For the DASH eating plan, you will follow these general guidelines:  Choose foods with a percent daily value for sodium of less than 5% (as listed on the food label).  Use salt-free seasonings or herbs instead of table salt or sea salt.  Check with your health care provider or pharmacist before using salt substitutes.  Eat lower-sodium products, often labeled as "lower sodium" or "no salt added."  Eat fresh foods.  Eat more vegetables, fruits, and low-fat dairy products.  Choose whole grains. Look for the word "whole" as the first word in the ingredient list.  Choose fish   Limit sweets, desserts, sugars, and sugary drinks.  Choose heart-healthy fats.  Eat veggie cheese   Eat more home-cooked food and less  restaurant, buffet, and fast food.  Limit fried foods.  Cook foods using methods other than frying.  Limit canned vegetables. If you do use them, rinse them well to decrease the sodium.  When eating at a restaurant, ask that your food be prepared with less salt, or no salt if possible.                      WHAT FOODS CAN I EAT? Read Dr Fara Olden Fuhrman's books on The End of Dieting & The End of Diabetes  Grains Whole grain or whole wheat bread. Brown rice. Whole grain or whole wheat pasta. Quinoa, bulgur, and whole grain cereals. Low-sodium cereals. Corn or whole wheat flour tortillas. Whole grain cornbread. Whole grain crackers. Low-sodium crackers.  Vegetables Fresh or frozen vegetables (raw, steamed, roasted, or grilled). Low-sodium or reduced-sodium tomato and vegetable juices. Low-sodium or reduced-sodium tomato sauce and paste. Low-sodium or reduced-sodium canned vegetables.   Fruits All fresh, canned (in natural juice), or frozen fruits.  Protein Products  All fish and seafood.  Dried beans, peas, or lentils. Unsalted nuts and seeds. Unsalted canned beans.  Dairy Low-fat dairy products, such as skim or 1% milk, 2% or reduced-fat cheeses, low-fat ricotta or cottage cheese, or plain low-fat yogurt. Low-sodium or reduced-sodium cheeses.  Fats and Oils Tub margarines without trans fats. Light or reduced-fat mayonnaise and salad dressings (reduced sodium). Avocado. Safflower, olive, or canola oils. Natural peanut or almond butter.  Other Unsalted popcorn and pretzels. The items listed above may not be a complete list of recommended foods or beverages. Contact your dietitian for more options.  ++++++++++++++++++  WHAT FOODS ARE NOT RECOMMENDED? Grains/ White flour or wheat flour White bread. White pasta. White rice. Refined cornbread. Bagels and croissants. Crackers that contain trans fat.  Vegetables  Creamed or fried vegetables. Vegetables in a . Regular canned vegetables.  Regular canned tomato sauce and paste. Regular tomato and vegetable juices.  Fruits Dried fruits. Canned fruit in light or heavy syrup. Fruit juice.  Meat and Other Protein Products Meat in general - RED meat & White meat.  Fatty cuts of meat. Ribs, chicken wings, all processed meats as bacon, sausage, bologna, salami, fatback, hot dogs, bratwurst and packaged luncheon meats.  Dairy Whole or 2% milk, cream, half-and-half, and cream cheese. Whole-fat or sweetened yogurt. Full-fat cheeses or blue cheese. Non-dairy creamers and whipped toppings. Processed cheese, cheese spreads, or cheese curds.  Condiments Onion and garlic salt, seasoned salt, table salt, and sea salt. Canned and packaged gravies. Worcestershire sauce. Tartar sauce. Barbecue sauce. Teriyaki sauce. Soy sauce, including reduced sodium. Steak sauce.  Fish sauce. Oyster sauce. Cocktail sauce. Horseradish. Ketchup and mustard. Meat flavorings and tenderizers. Bouillon cubes. Hot sauce. Tabasco sauce. Marinades. Taco seasonings. Relishes.  Fats and Oils Butter, stick margarine, lard, shortening and bacon fat. Coconut, palm kernel, or palm oils. Regular salad dressings.  Pickles and olives. Salted popcorn and pretzels.  The items listed above may not be a complete list of foods and beverages to avoid.

## 2019-10-20 NOTE — Progress Notes (Signed)
3 MONTH FOLLOW UP  Assessment / Plan:   Desiree Anderson was seen today for 3 month follow-up   Diagnoses and all orders for this visit:  Essential hypertension No medications at this time Monitor blood pressure at home; call if consistently over 130/80 Continue DASH diet.   Reminder to go to the ER if any CP, SOB, nausea, dizziness, severe HA, changes vision/speech, left arm numbness and tingling and jaw pain. -     CBC with Differential/Platelet -     COMPLETE METABOLIC PANEL WITH GFR -     Magnesium  Hyperlipidemia, mixed Continue medications: Crestor 20mg  and zetia 10mg  Continue low cholesterol diet and exercise.  Check lipid panel.  -     Lipid panel  Vitamin D deficiency Continue supplementation -     VITAMIN D 25 Hydroxy (Vit-D Deficiency, Fractures)  Hypothyroidism, unspecified type Will check labs today, some hyper symptoms -     levothyroxine (SYNTHROID) 100 MCG tablet; Take half tablet (4mcg)  daily on an empty stomach with only water for 30 minutes & no Antacid meds for 4 hours.  Gastroesophageal reflux disease, esophagitis presence not specified Doing well at this time Continue PPI/H2 blocker, diet discussed  Hypokalemia Taking Rx supplementation Will check labs  Anxiety Doing well on current regiment Taking Alprazolam 0.5mg  half tablet Discussed taking only PRN and limiting to 5 days a week to avoid addiction   Abnormal glucose Discussed dietary and exercise modifications -     Hemoglobin A1c -     Insulin, random  Medication management -     CBC with Differential/Platelet -     COMPLETE METABOLIC PANEL WITH GFR -     Magnesium -     Lipid panel -     TSH -     Hemoglobin A1c -     Insulin, random -     VITAMIN D 25 Hydroxy (Vit-D Deficiency, Fractures) -     Vitamin B12   History of breast cancer  Malignant neoplasm of upper-outer quadrant of right breast in female, estrogen receptor positive (HCC) Regular mammograms, follows with oncology Dr  Billy Fischer Taking Tamoxifen  Insomnia, unspecified type Doing well at this time Discussed good sleep hygiene, decrease stimulation prior to sleep Increase day time activity Avoid caffeine in evenings Try OTC Melatonin 10mg  or benedryl  Need for influenza vaccination -     Flu vaccine greater than or equal to 3yo with preservative IM Received today  Diverticulitis -     ciprofloxacin (CIPRO) 500 MG tablet; Take 1 tablet (500 mg total) by mouth 2 (two) times daily for 7 days. -     metroNIDAZOLE (FLAGYL) 500 MG tablet; Take 1 tablet (500 mg total) by mouth 3 (three) times daily for 7 days. The 'BRAT' diet is suggested, then progress to diet as tolerated. Call if bloody stools, persistent diarrhea, not voiding regularly, unable to take oral fluids, vomiting, high fever, severe weakness, abdominal pain or failure to improve in 2-3 days.  Has Levsin 0.125mg  Q 6 PRN Discussed probiotic   BMI 32.0-32.9,adult -     Hemoglobin A1c -     Insulin, random  Over 30 minutes of interview exam, counseling, chart review and critical decision making was performed  Future Appointments  Date Time Provider [place]  01/21/2020 10:45 AM Vicie Mutters, PA-C [facility] None  01/26/2020  2:00 PM Unk Pinto, MD [facility] None  05/04/2020 11:15 AM Garnet Sierras, NP [facility] None  07/08/2020 11:00 AM [facility]  [facility] None  07/08/2020 11:30 AM Magrinat, Virgie Dad, MD [facility] None      Subjective:  Desiree Anderson is a 63 y.o. female who presents for 3 month follow up on HTN, HLD, hypothyroidism, GERD, weight and vitamin D Deficency.   She has history of fibromyalgia and chronic pain from DDD/DJD.  Also has history of breast cancer, right, s/p mastectomy (04/2016) with radiation and chemo.  She is taking Tamoxifen x5 years and follows with Dr Jana Hakim.  Last appointment was 07/09/19.  She had an ultrasound of right breast reagrding palpable changes.  Oil cysts noted as well as  fat changes. July 2021 next scheduled mammogram.  Getting up twice during the night to use the bathroom.  Denies any dysuria, fevers or hematuria.   She reports that three weeks ago she started having a flare of abdominal pain after she eats.  She is having lots of gas early in the morning and when she lays down.  She takes a PPI to help with reflux (Pepsid 40mg ) daily.  She has tried small meals and increased her liquids.  She has had food like [place] and white bread.  She is having painful bowel movements.  AT times she is having 8 or more episodes of diarrhea daily.  She has been increasing her fluid intake related to this.  She has not been out of the home much related to COVID-19 restricts but this has prevented her from leaving related to use of public bathroom.  Some have closed or not open for use which is problematic when she is having urgency related to bowel movements.   HTN predates 2004.  Her blood pressure has been controlled at home, today their BP is BP: 140/84 She does workout. She denies chest pain, shortness of breath, dizziness.   She is on cholesterol medication and denies myalgias. Her cholesterol is not at goal. The cholesterol last visit was:   Lab Results  Component Value Date   CHOL 157 07/17/2019   HDL 51 07/17/2019   LDLCALC 84 07/17/2019   TRIG 123 07/17/2019   CHOLHDL 3.1 07/17/2019    She has been working on diet and exercise for abnormal glucose/prediabetes, and denies hyperglycemia, hypoglycemia , nausea, polydipsia, polyuria and visual disturbances. Last A1C in the office was:  Lab Results  Component Value Date   HGBA1C 5.9 (H) 01/02/2019   Last GFR:   Lab Results  Component Value Date   GFRNONAA 96 01/02/2019   Lab Results  Component Value Date   GFRAA 111 01/02/2019   Patient is on Vitamin D supplement.  Deficiency noted in 09/2007.  Last check she was at goal. Lab Results  Component Value Date   VD25OH 14 01/02/2019     She has been on  thyroid replacement since 2002. Currently taking levothyroxine 12mcg daily.  Her medication was changed last visit. Lab Results  Component Value Date   TSH 0.29 (L) 04/15/2019      Medication Review: Current Outpatient Medications on File Prior to Visit  Medication Sig Dispense Refill  . ALPRAZolam (XANAX) 0.5 MG tablet TAKE 1/2-1 TABLET BY MOUTH 2-3 TIMES DAILY ONLY IF NEEDED FOR ANXIETY ATTACKS AND PLEASE TRY TO LIMIT TO 5 DAYS A WEEK TO AVOID ADDICTION 90 tablet 0  . Ascorbic Acid (VITAMIN C) 1000 MG tablet Take 1,000 mg by mouth daily.    . cetirizine (ZYRTEC) 10 MG tablet Take 10 mg by mouth daily.     . Cholecalciferol (VITAMIN D-3)  5000 UNITS TABS Take 5,000 Units by mouth 2 (two) times daily.     Marland Kitchen ezetimibe (ZETIA) 10 MG tablet Take 1 tablet daily for Cholesterol 90 tablet 1  . famotidine (PEPCID) 40 MG tablet Take 1 tablet (40 mg total) by mouth 2 (two) times daily. (Patient taking differently: Take 40 mg by mouth as needed. ) 60 tablet 1  . fluticasone (FLONASE) 50 MCG/ACT nasal spray USE TWO SPRAY(S) IN EACH NOSTRIL ONCE DAILY AS NEEDED 48 g 3  . hyoscyamine (LEVSIN SL) 0.125 MG SL tablet DISSOLVE 1 TABLET IN MOUTH EVERY 6 HOURS AS NEEDED 60 tablet 0  . Multiple Vitamins-Minerals (GERITABS PO) Take 1 tablet by mouth daily. Reported on 06/08/2016    . potassium chloride SA (KLOR-CON) 20 MEQ tablet Take 1 tablet 2 x /day for Potassium 180 tablet 3  . rosuvastatin (CRESTOR) 20 MG tablet Take 1 tablet daily for Cholesterol 90 tablet 1  . tamoxifen (NOLVADEX) 20 MG tablet Take 1 tablet (20 mg total) by mouth daily. 90 tablet 4  . venlafaxine XR (EFFEXOR-XR) 37.5 MG 24 hr capsule Take 1 capsule (37.5 mg total) by mouth daily with breakfast. 90 capsule 4  . zinc gluconate 50 MG tablet Take 50 mg by mouth daily.    Marland Kitchen gabapentin (NEURONTIN) 600 MG tablet Take 1/2 to 1 tablet 2 to 3 x / day as need to control pain 270 tablet 1   No current facility-administered medications on file prior  to visit.     Allergies  Allergen Reactions  . Iodinated Diagnostic Agents Other (See Comments)    Unknown   . Lipitor [Atorvastatin] Other (See Comments)    Muscle and legs aches  . Shellfish Allergy Diarrhea and Nausea And Vomiting  . Betadine [Povidone Iodine] Rash    Current Problems (verified) Patient Active Problem List   Diagnosis Date Noted  . Insomnia 03/25/2018  . Situational anxiety 03/25/2018  . Encounter for Medicare annual wellness exam 02/19/2017  . Tobacco chew use 05/15/2016  . Malignant neoplasm of upper-outer quadrant of right breast in female, estrogen receptor positive ([facility]) 04/21/2016  . Hypothyroidism 04/18/2016  . Obesity (BMI 30.0-34.9) 10/11/2015  . Medication management 09/24/2014  . Cystocele, midline 03/23/2014  . Essential hypertension 12/10/2013  . Mixed hyperlipidemia 12/10/2013  . Prediabetes 12/10/2013  . Vitamin D deficiency 12/10/2013  . DJD  12/10/2013  . DDD, lumbar 12/10/2013  . Fibromyalgia Syndrome 12/10/2013    Screening Tests Immunization History  Administered Date(s) Administered  . Influenza Inj Mdck Quad With Preservative 09/04/2017, 09/30/2018  . Influenza Split 09/18/2013, 09/25/2014, 10/21/2019  . Influenza, Seasonal, Injecte, Preservative Fre 10/11/2015  . Influenza,inj,quad, With Preservative 08/28/2016, 08/27/2017  . Influenza-Unspecified 08/27/2016  . Pneumococcal-Unspecified 11/27/2000  . Tdap 03/16/2011    Preventative care: Last colonoscopy:over 13 years ago,  02/2017 Cologuard Last mammogram: 2019, scheduled for 2020 Last pap smear/pelvic exam: 2017   DEXA: At age age 22years  Prior vaccinations: TD or Tdap: 2012  Influenza: 09/2019, received today Pneumococcal: 2002 Prevnar13: Due at age 24 Shingles/Zostavax: DUE, discussed Shingrix   Names of Other Physician/Practitioners you currently use: 1. [facility] here for primary care 2. Eye Exam Dr Manuella Ghazi 02/2018 3.  Dental Exam Dr Minerva Fester, 5 years ago, DUE  Patient Care Team: Unk Pinto, MD as PCP - General (Internal Medicine) Fay Records, MD as Consulting Physician (Cardiology) [name], MD as Consulting Physician (Orthopedic Surgery) Carolan Clines, MD (Inactive) as Consulting Physician (Urology)  Vanessa Kick, MD as Consulting Physician (Obstetrics and Gynecology) Magrinat, Virgie Dad, MD as Consulting Physician (Oncology) Rolm Bookbinder, MD as Consulting Physician (General Surgery) Thea Silversmith, MD as Consulting Physician (Radiation Oncology)  SURGICAL HISTORY She  has a past surgical history that includes Incontinence surgery (2009  &  2005); BLADDER TACK (2003); Vein bypass surgery (AGE 76); transthoracic echocardiogram (06-16-2013); Cardiovascular stress test (07-02-2013  DR [facility]); CYSTO/  HYDRODISTENTION/  BLADDER BX/   INSTILLATION THERAPY (08-29-2010); Vaginal hysterectomy (2000); Cataract extraction w/ intraocular lens implant (Left, 01/2014); Excision of mesh (N/A, 03/23/2014); Excision vaginal cyst (N/A, 04/06/2014); Radioactive seed guided mastectomy with axillary sentinel lymph node biopsy (Right, 05/18/2016); Breast lumpectomy (Right, 05/18/2016); Breast biopsy (Right, 04/18/2016); Cholecystectomy (N/A, 12/10/2017); Cataract extraction w/ intraocular lens implant (Right, 12/2017); and Breast mass removal (Left, 1985). FAMILY HISTORY Her family history includes Diabetes in her mother; Diverticulitis in her mother; Intestinal polyp in her sister; Lung cancer in her brother. SOCIAL HISTORY She  reports that she quit smoking about 23 years ago. Her smoking use included cigarettes. She quit after 20.00 years of use. Her smokeless tobacco use includes snuff. She reports that she does not drink alcohol or use drugs.     Objective:     Today's Vitals   10/21/19 1113  BP: 140/84  Pulse: 72  Resp: 16  Temp: (!) 97.5 F (36.4 C)  Weight: 178 lb 12.8 oz (81.1 kg)    Body mass index is 32.18 kg/m.  General appearance: alert, no distress, WD/WN, female HEENT: normocephalic, sclerae anicteric, TMs pearly, nares patent, no discharge or erythema, pharynx normal Oral cavity: MMM, no lesions Neck: supple, no lymphadenopathy, no thyromegaly, no masses Heart: RRR, normal S1, S2, no murmurs Lungs: CTA bilaterally, no wheezes, rhonchi, or rales Abdomen: +bs, soft, non tender, non distended, no masses, no hepatomegaly, no splenomegaly Musculoskeletal: nontender, no swelling, no obvious deformity Extremities: no edema, no cyanosis, no clubbing Pulses: 2+ symmetric, upper and lower extremities, normal cap refill Neurological: alert, oriented x 3, CN2-12 intact, strength normal upper extremities and lower extremities, sensation normal throughout, DTRs 2+ throughout, no cerebellar signs, gait normal Psychiatric: normal affect, behavior normal, pleasant     Garnet Sierras, NP [facility] 10/21/2019  11:25 AM

## 2019-10-21 ENCOUNTER — Other Ambulatory Visit: Payer: Self-pay

## 2019-10-21 ENCOUNTER — Encounter: Payer: Self-pay | Admitting: Adult Health Nurse Practitioner

## 2019-10-21 ENCOUNTER — Ambulatory Visit (INDEPENDENT_AMBULATORY_CARE_PROVIDER_SITE_OTHER): Payer: Medicare Other | Admitting: Adult Health Nurse Practitioner

## 2019-10-21 VITALS — BP 140/84 | HR 72 | Temp 97.5°F | Resp 16 | Wt 178.8 lb

## 2019-10-21 DIAGNOSIS — Z23 Encounter for immunization: Secondary | ICD-10-CM

## 2019-10-21 DIAGNOSIS — K219 Gastro-esophageal reflux disease without esophagitis: Secondary | ICD-10-CM

## 2019-10-21 DIAGNOSIS — F419 Anxiety disorder, unspecified: Secondary | ICD-10-CM

## 2019-10-21 DIAGNOSIS — E876 Hypokalemia: Secondary | ICD-10-CM

## 2019-10-21 DIAGNOSIS — E039 Hypothyroidism, unspecified: Secondary | ICD-10-CM | POA: Diagnosis not present

## 2019-10-21 DIAGNOSIS — E559 Vitamin D deficiency, unspecified: Secondary | ICD-10-CM | POA: Diagnosis not present

## 2019-10-21 DIAGNOSIS — E782 Mixed hyperlipidemia: Secondary | ICD-10-CM

## 2019-10-21 DIAGNOSIS — K5792 Diverticulitis of intestine, part unspecified, without perforation or abscess without bleeding: Secondary | ICD-10-CM

## 2019-10-21 DIAGNOSIS — R7309 Other abnormal glucose: Secondary | ICD-10-CM | POA: Diagnosis not present

## 2019-10-21 DIAGNOSIS — I10 Essential (primary) hypertension: Secondary | ICD-10-CM

## 2019-10-21 DIAGNOSIS — Z6832 Body mass index (BMI) 32.0-32.9, adult: Secondary | ICD-10-CM

## 2019-10-21 DIAGNOSIS — C50411 Malignant neoplasm of upper-outer quadrant of right female breast: Secondary | ICD-10-CM

## 2019-10-21 DIAGNOSIS — Z79899 Other long term (current) drug therapy: Secondary | ICD-10-CM

## 2019-10-21 DIAGNOSIS — Z170 Estrogen receptor positive status [ER+]: Secondary | ICD-10-CM

## 2019-10-21 DIAGNOSIS — Z853 Personal history of malignant neoplasm of breast: Secondary | ICD-10-CM

## 2019-10-21 DIAGNOSIS — G4700 Insomnia, unspecified: Secondary | ICD-10-CM

## 2019-10-21 MED ORDER — LEVOTHYROXINE SODIUM 100 MCG PO TABS: 100.0000 ug | ORAL_TABLET | Freq: Every day | ORAL | 1 refills | Status: DC

## 2019-10-21 MED ORDER — CIPROFLOXACIN HCL 500 MG PO TABS: 500.0000 mg | ORAL_TABLET | Freq: Two times a day (BID) | ORAL | 0 refills | Status: AC

## 2019-10-21 MED ORDER — METRONIDAZOLE 500 MG PO TABS: 500.0000 mg | ORAL_TABLET | Freq: Three times a day (TID) | ORAL | 0 refills | Status: AC

## 2019-10-22 ENCOUNTER — Other Ambulatory Visit: Payer: Self-pay | Admitting: Adult Health Nurse Practitioner

## 2019-10-22 DIAGNOSIS — E039 Hypothyroidism, unspecified: Secondary | ICD-10-CM

## 2019-10-22 LAB — COMPLETE METABOLIC PANEL WITH GFR
AG Ratio: 1.7 (calc) (ref 1.0–2.5)
ALT: 28 U/L (ref 6–29)
AST: 28 U/L (ref 10–35)
Albumin: 3.9 g/dL (ref 3.6–5.1)
Alkaline phosphatase (APISO): 56 U/L (ref 37–153)
BUN: 9 mg/dL (ref 7–25)
CO2: 25 mmol/L (ref 20–32)
Calcium: 9.2 mg/dL (ref 8.6–10.4)
Chloride: 105 mmol/L (ref 98–110)
Creat: 0.55 mg/dL (ref 0.50–0.99)
GFR, Est African American: 117 mL/min/{1.73_m2} (ref >=60)
GFR, Est Non African American: 101 mL/min/{1.73_m2} (ref >=60)
Globulin: 2.3 g/dL (calc) (ref 1.9–3.7)
Glucose, Bld: 94 mg/dL (ref 65–99)
Potassium: 4.6 mmol/L (ref 3.5–5.3)
Sodium: 144 mmol/L (ref 135–146)
Total Bilirubin: 0.4 mg/dL (ref 0.2–1.2)
Total Protein: 6.2 g/dL (ref 6.1–8.1)

## 2019-10-22 LAB — HEMOGLOBIN A1C
Hgb A1c MFr Bld: 5.8 % of total Hgb — ABNORMAL HIGH (ref <5.7)
Mean Plasma Glucose: 120 (calc)
eAG (mmol/L): 6.6 (calc)

## 2019-10-22 LAB — VITAMIN D 25 HYDROXY (VIT D DEFICIENCY, FRACTURES): Vit D, 25-Hydroxy: 90 ng/mL (ref 30–100)

## 2019-10-22 LAB — CBC WITH DIFFERENTIAL/PLATELET
Absolute Monocytes: 553 cells/uL (ref 200–950)
Basophils Absolute: 32 cells/uL (ref 0–200)
Basophils Relative: 0.4 %
Eosinophils Absolute: 87 {cells}/uL (ref 15–500)
Eosinophils Relative: 1.1 %
HCT: 41.2 % (ref 35.0–45.0)
Hemoglobin: 13.3 g/dL (ref 11.7–15.5)
Lymphs Abs: 4155 cells/uL — ABNORMAL HIGH (ref 850–3900)
MCH: 27 pg (ref 27.0–33.0)
MCHC: 32.3 g/dL (ref 32.0–36.0)
MCV: 83.6 fL (ref 80.0–100.0)
MPV: 10.9 fL (ref 7.5–12.5)
Monocytes Relative: 7 %
Neutro Abs: 3073 cells/uL (ref 1500–7800)
Neutrophils Relative %: 38.9 %
Platelets: 225 10*3/uL (ref 140–400)
RBC: 4.93 10*6/uL (ref 3.80–5.10)
RDW: 12.6 % (ref 11.0–15.0)
Total Lymphocyte: 52.6 %
WBC: 7.9 10*3/uL (ref 3.8–10.8)

## 2019-10-22 LAB — LIPID PANEL
Cholesterol: 131 mg/dL (ref <200)
HDL: 45 mg/dL — ABNORMAL LOW (ref >=50)
LDL Cholesterol (Calc): 65 mg/dL (calc)
Non-HDL Cholesterol (Calc): 86 mg/dL (calc) (ref <130)
Total CHOL/HDL Ratio: 2.9 (calc) (ref <5.0)
Triglycerides: 120 mg/dL (ref <150)

## 2019-10-22 LAB — MAGNESIUM: Magnesium: 1.8 mg/dL (ref 1.5–2.5)

## 2019-10-22 LAB — TSH: TSH: 0.06 mIU/L — ABNORMAL LOW (ref 0.40–4.50)

## 2019-11-09 ENCOUNTER — Other Ambulatory Visit: Payer: Self-pay | Admitting: Internal Medicine

## 2019-11-20 ENCOUNTER — Encounter: Payer: Self-pay | Admitting: Physician Assistant

## 2019-11-20 ENCOUNTER — Ambulatory Visit: Payer: Medicare Other | Admitting: Physician Assistant

## 2019-11-20 VITALS — BP 154/84 | HR 82 | Temp 98.4°F | Ht 62.5 in | Wt 181.0 lb

## 2019-11-20 DIAGNOSIS — R197 Diarrhea, unspecified: Secondary | ICD-10-CM | POA: Diagnosis not present

## 2019-11-20 DIAGNOSIS — R1032 Left lower quadrant pain: Secondary | ICD-10-CM

## 2019-11-20 MED ORDER — CHOLESTYRAMINE 4 G PO PACK: 4.0000 g | PACK | Freq: Two times a day (BID) | ORAL | 5 refills | Status: DC

## 2019-11-20 NOTE — Progress Notes (Signed)
Chief Complaint: Diarrhea, abdominal pain  HPI:    [name] is a 63 year old female with past medical history as listed below, known to Dr. Loletha Carrow, who presents to clinic today with a complaint of diarrhea and abdominal pain.    11/09/2017 patient seen in clinic for abdominal pain and diarrhea.  At that time reported many years of IBS symptoms with tendencies to postprandial urgency and semiformed stool.  This has been escalating for a few months.  She was prescribed Cipro and Flagyl by PCP and thought this is perhaps colitis.  Describe considerable improvement after that treatment back to her baseline IBS.  At that point she was given a trial of Levsin.  Cholestyramine was also discussed as this may have provided her some benefit previously.  She was also referred to surgery for 10 mm gallbladder polyp.    12/10/2017 lap cholecystectomy.    Today, the patient presents to clinic and explains that she has had diarrhea greater than 10 stools before lunch and often up to 20-30 stools a day for a few years, though this has gotten worse over the past 2 months.  Explains that she finished a round of Cipro and Flagyl on December 2 after 7 days but this did not help like it did the last time.  Explains that she has also been on Cholestyramine in the past which may have offered some benefit.  Does remember the Levsin but tells me it only worked for about an hour before she went back to having loose stools.  Has been using Imodium as needed about once every couple of days because she was unsure this is safe for her and it does help to make her able to go out and do things.  Explained that she has not had a colonoscopy for greater than 8 years, did have a Cologuard done 3 years ago.  Along with diarrhea experiences a generalized abdominal pain which is worse on the left side of her abdomen and into her left lower quadrant.  This pain has been getting worse over the past couple of weeks.    Denies fever, chills or  blood in her stool.  Past Medical History:  Diagnosis Date  . Anxiety   . Breast cancer ([place])   . Complication of anesthesia    hypotension  . DDD (degenerative disc disease), lumbar   . Erosion of vaginal mesh ([place])   . Fibromyalgia   . GERD (gastroesophageal reflux disease)   . History of gastric ulcer   . History of radiation therapy 07/06/16- 08/22/16   Right Breast  . Hypertension   . Hypothyroidism   . Personal history of radiation therapy   . Rectocele     Past Surgical History:  Procedure Laterality Date  . BLADDER TACK  2003  . BREAST BIOPSY Right 04/18/2016  . BREAST LUMPECTOMY Right 05/18/2016  . Breast mass removal Left 1985   Benign  . CARDIOVASCULAR STRESS TEST  07-02-2013  DR DALTON MCLEAN   LOW RISK NUCLEAR STUDY WITH A SMALL, MILD APICAL SEPTAL FIXED PERFUSION DEFECT .  GIVEN NORMAL WALL FUNCTION , SUSPECT REPRESENTS ATTENUATION/  EF 74%/  NO ISCHEMIA  . CATARACT EXTRACTION W/ INTRAOCULAR LENS IMPLANT Left 01/2014  . CATARACT EXTRACTION W/ INTRAOCULAR LENS IMPLANT Right 12/2017   Dr. Manuella Ghazi  . CHOLECYSTECTOMY N/A 12/10/2017   Procedure: LAPAROSCOPIC CHOLECYSTECTOMY;  Surgeon: Rolm Bookbinder, MD;  Location: [facility];  Service: General;  Laterality: N/A;  . CYSTO/  HYDRODISTENTION/  BLADDER  BX/   INSTILLATION THERAPY  08-29-2010  . EXCISION OF MESH N/A 03/23/2014   Procedure: EXCISION OF VAGINAL AND PERI-URETHRAL MESH, EXTRACTION FROM URETHRA TO APEX, insertion of XENFORM in the vaginal vault;  Surgeon: Ailene Rud, MD;  Location: [facility];  Service: Urology;  Laterality: N/A;  . EXCISION VAGINAL CYST N/A 04/06/2014   Procedure: VAGINAL SPECULUM EXAMINATION/POSSIBLE DRAINAGE OF HEMATOMA;  Surgeon: Ailene Rud, MD;  Location: [facility];  Service: Urology;  Laterality: N/A;  . INCONTINENCE SURGERY  2009  &  2005  . RADIOACTIVE SEED GUIDED PARTIAL MASTECTOMY WITH AXILLARY SENTINEL LYMPH NODE BIOPSY Right 05/18/2016    Procedure: RADIOACTIVE SEED GUIDED RIGHT BREAST LUMPECTOMY WITH AXILLARY SENTINEL LYMPH NODE BIOPSY;  Surgeon: Rolm Bookbinder, MD;  Location: [place];  Service: General;  Laterality: Right;  RADIOACTIVE SEED GUIDED RIGHT BREAST LUMPECTOMY WITH AXILLARY SENTINEL LYMPH NODE BIOPSY  . TRANSTHORACIC ECHOCARDIOGRAM  06-16-2013   GRADE I DIASTOLIC DYSFUNCTION/  EF 55-60%/  MILD AR/  TRIVIAL MR  &  TR  . VAGINAL HYSTERECTOMY  2000   w/  unilateral salpingoophorectomy  . VEIN BYPASS SURGERY  AGE 58   RIGHT LEG    Current Outpatient Medications  Medication Sig Dispense Refill  . ALPRAZolam (XANAX) 0.5 MG tablet TAKE 1/2-1 TABLET BY MOUTH 2-3 TIMES DAILY ONLY IF NEEDED FOR ANXIETY ATTACKS AND PLEASE TRY TO LIMIT TO 5 DAYS A WEEK TO AVOID ADDICTION 90 tablet 0  . Ascorbic Acid (VITAMIN C) 1000 MG tablet Take 1,000 mg by mouth daily.    . cetirizine (ZYRTEC) 10 MG tablet Take 10 mg by mouth daily.     . Cholecalciferol (VITAMIN D-3) 5000 UNITS TABS Take 5,000 Units by mouth 2 (two) times daily.     Marland Kitchen ezetimibe (ZETIA) 10 MG tablet Take 1 tablet daily for Cholesterol 90 tablet 1  . famotidine (PEPCID) 40 MG tablet Take 1 tablet (40 mg total) by mouth 2 (two) times daily. (Patient taking differently: Take 40 mg by mouth as needed. ) 60 tablet 1  . fluticasone (FLONASE) 50 MCG/ACT nasal spray USE TWO SPRAY(S) IN EACH NOSTRIL ONCE DAILY AS NEEDED 48 g 3  . hyoscyamine (LEVSIN SL) 0.125 MG SL tablet DISSOLVE 1 TABLET IN MOUTH EVERY 6 HOURS AS NEEDED 60 tablet 0  . levothyroxine (SYNTHROID) 100 MCG tablet Take 1 tablet (100 mcg total) by mouth daily. on empty stomach. 90 tablet 1  . Multiple Vitamins-Minerals (GERITABS PO) Take 1 tablet by mouth daily. Reported on 06/08/2016    . potassium chloride SA (KLOR-CON) 20 MEQ tablet Take 1 tablet 2 x /day for Potassium 180 tablet 3  . rosuvastatin (CRESTOR) 20 MG tablet Take 1 tablet Daily for Cholesterol 90 tablet 3  . tamoxifen (NOLVADEX) 20 MG  tablet Take 1 tablet (20 mg total) by mouth daily. 90 tablet 4  . venlafaxine XR (EFFEXOR-XR) 37.5 MG 24 hr capsule Take 1 capsule (37.5 mg total) by mouth daily with breakfast. 90 capsule 4  . zinc gluconate 50 MG tablet Take 50 mg by mouth daily.    . cholestyramine (QUESTRAN) 4 g packet Take 1 packet (4 g total) by mouth 2 (two) times daily. 60 each 5  . gabapentin (NEURONTIN) 600 MG tablet Take 1/2 to 1 tablet 2 to 3 x / day as need to control pain 270 tablet 1   No current facility-administered medications for this visit.    Allergies as of 11/20/2019 - Review  Complete 11/20/2019  Allergen Reaction Noted  . Iodine  11/20/2019  . Lipitor [atorvastatin] Other (See Comments) 01/05/2016  . Shellfish allergy Diarrhea and Nausea And Vomiting 09/18/2012  . Betadine [povidone iodine] Rash 05/10/2016    Family History  Problem Relation Age of Onset  . Diabetes Mother   . Diverticulitis Mother   . Lung cancer Brother   . Intestinal polyp Sister     Social History   Socioeconomic History  . Marital status: Married    Spouse name: Not on file  . Number of children: 2  . Years of education: Not on file  . Highest education level: Not on file  Occupational History  . Not on file  Tobacco Use  . Smoking status: Former Smoker    Years: 20.00    Types: Cigarettes    Quit date: 11/28/1995    Years since quitting: 23.9  . Smokeless tobacco: Current User    Types: Snuff  . Tobacco comment: dips daily  Substance and Sexual Activity  . Alcohol use: No  . Drug use: No  . Sexual activity: Not on file  Other Topics Concern  . Not on file  Social History Narrative  . Not on file   Social Determinants of Health   Financial Resource Strain:   . Difficulty of Paying Living Expenses: Not on file  Food Insecurity:   . Worried About Charity fundraiser in the Last Year: Not on file  . Ran Out of Food in the Last Year: Not on file  Transportation Needs:   . Lack of Transportation  (Medical): Not on file  . Lack of Transportation (Non-Medical): Not on file  Physical Activity:   . Days of Exercise per Week: Not on file  . Minutes of Exercise per Session: Not on file  Stress:   . Feeling of Stress : Not on file  Social Connections:   . Frequency of Communication with Friends and Family: Not on file  . Frequency of Social Gatherings with Friends and Family: Not on file  . Attends Religious Services: Not on file  . Active Member of Clubs or Organizations: Not on file  . Attends Archivist Meetings: Not on file  . Marital Status: Not on file  Intimate Partner Violence:   . Fear of Current or Ex-Partner: Not on file  . Emotionally Abused: Not on file  . Physically Abused: Not on file  . Sexually Abused: Not on file    Review of Systems:    Constitutional: No weight loss, fever or chills Cardiovascular: No chest pain Respiratory: No SOB  Gastrointestinal: See HPI and otherwise negative   Physical Exam:  Vital signs: BP (!) 154/84   Pulse 82   Temp 98.4 F (36.9 C)   Ht 5' 2.5" (1.588 m)   Wt 181 lb (82.1 kg)   BMI 32.58 kg/m   Constitutional:   Pleasant Caucasian female appears to be in NAD, Well developed, Well nourished, alert and cooperative Respiratory: Respirations even and unlabored. Lungs clear to auscultation bilaterally.   No wheezes, crackles, or rhonchi.  Cardiovascular: Normal S1, S2. No MRG. Regular rate and rhythm. No peripheral edema, cyanosis or pallor.  Gastrointestinal:  Soft, nondistended, marked ttp in LLQ with involuntary guarding, mild ttp in LUQ, Normal bowel sounds. No appreciable masses or hepatomegaly. Rectal:  Not performed.  Psychiatric: Demonstrates good judgement and reason without abnormal affect or behaviors.  MOST RECENT LABS AND IMAGING: CBC    Component Value  Date/Time   WBC 7.9 10/21/2019 1151   RBC 4.93 10/21/2019 1151   HGB 13.3 10/21/2019 1151   HGB 13.6 07/03/2017 1241   HCT 41.2 10/21/2019 1151    HCT 41.9 07/03/2017 1241   PLT 225 10/21/2019 1151   PLT 257 07/03/2017 1241   MCV 83.6 10/21/2019 1151   MCV 85.3 07/03/2017 1241   MCH 27.0 10/21/2019 1151   MCHC 32.3 10/21/2019 1151   RDW 12.6 10/21/2019 1151   RDW 13.6 07/03/2017 1241   LYMPHSABS 4,155 (H) 10/21/2019 1151   LYMPHSABS 5.6 (H) 07/03/2017 1241   MONOABS 0.7 07/03/2017 1241   EOSABS 87 10/21/2019 1151   EOSABS 0.0 07/03/2017 1241   BASOSABS 32 10/21/2019 1151   BASOSABS 0.0 07/03/2017 1241    CMP     Component Value Date/Time   NA 144 10/21/2019 1151   NA 144 07/03/2017 1241   K 4.6 10/21/2019 1151   K 3.8 07/03/2017 1241   CL 105 10/21/2019 1151   CO2 25 10/21/2019 1151   CO2 27 07/03/2017 1241   GLUCOSE 94 10/21/2019 1151   GLUCOSE 142 (H) 07/03/2017 1241   BUN 9 10/21/2019 1151   BUN 11.2 07/03/2017 1241   CREATININE 0.55 10/21/2019 1151   CREATININE 0.9 07/03/2017 1241   CALCIUM 9.2 10/21/2019 1151   CALCIUM 9.2 07/03/2017 1241   PROT 6.2 10/21/2019 1151   PROT 6.7 07/03/2017 1241   ALBUMIN 3.4 (L) 12/10/2017 0749   ALBUMIN 3.7 07/03/2017 1241   AST 28 10/21/2019 1151   AST 22 07/03/2017 1241   ALT 28 10/21/2019 1151   ALT 37 07/03/2017 1241   ALKPHOS 49 12/10/2017 0749   ALKPHOS 55 07/03/2017 1241   BILITOT 0.4 10/21/2019 1151   BILITOT 0.29 07/03/2017 1241   GFRNONAA 101 10/21/2019 1151   GFRAA 117 10/21/2019 1151    Assessment: 1.  Left sided abdominal pain: See below 2.  Diarrhea: Chronic for the patient, greater than 15 stools a day, worse over the past couple of weeks, no benefit after Cipro/Flagyl, marked left lower quadrant TTP on exam today; concern for diverticulitis/colitis versus IBS-D versus infectious cause  Plan: 1.  Discussed with patient that due to the severity of her pain on exam today I recommend we do a CT abdomen pelvis with contrast.  She was in agreement. 2.  Also ordered extensive stool studies including a GI pathogen panel, fecal pancreatic elastase, Lactoferrin  and O&P. 3.  We will start the patient on Cholestyramine 4 g packets twice daily. 4.  Discussed with the patient that she can continue to use Imodium, can use this twice a day if she sees benefit. 5.  If above imaging and stool studies are negative then recommend a colonoscopy for further evaluation of inflammatory bowel disease.  She is in agreement. 6.  Also briefly discussed putting her on a schedule Dicyclomine 20 mg 4 times daily.  We will wait to see if the Cholestyramine helps and try not to add too many things at once. 7.  Patient to follow in clinic with me in 3 to 4 weeks or sooner if necessary.  Ellouise Newer, PA-C [facility] 11/20/2019, 12:08 PM  Cc: Unk Pinto, MD

## 2019-11-20 NOTE — Patient Instructions (Addendum)
If you are age 61 or older, your body mass index should be between 23-30. Your Body mass index is 32.58 kg/m. If this is out of the aforementioned range listed, please consider follow up with your Primary Care Provider.  If you are age 45 or younger, your body mass index should be between 19-25. Your Body mass index is 32.58 kg/m. If this is out of the aformentioned range listed, please consider follow up with your Primary Care Provider.   Your provider has requested that you go to the basement level for lab work before leaving today. Press "B" on the elevator. The lab is located at the first door on the left as you exit the elevator.  We have sent the following medications to your pharmacy for you to pick up at your convenience: 1. Cholestyramine twice daily for a month. 2. Continue Imodium as needed.  You have been scheduled for a CT scan of the abdomen and pelvis at [place] are scheduled on Tuesday 11/25/19 at 9:30 am. You should arrive 15 minutes prior to your appointment time for registration. Please follow the written instructions below on the day of your exam:  WARNING: IF YOU ARE ALLERGIC TO IODINE/X-RAY DYE, PLEASE NOTIFY RADIOLOGY IMMEDIATELY AT [phone]! YOU WILL BE GIVEN A 13 HOUR PREMEDICATION PREP.  1) Do not eat or drink anything after 5:30 am  (4 hours prior to your test) 2) You have been given 2 bottles of oral contrast to drink. The solution may taste better if refrigerated, but do NOT add ice or any other liquid to this solution. Shake well before drinking.    Drink 1 bottle of contrast @ 7:30 am (2 hours prior to your exam)  Drink 1 bottle of contrast @ 8:30 am (1 hour prior to your exam)  You may take any medications as prescribed with a small amount of water, if necessary. If you take any of the following medications: METFORMIN, GLUCOPHAGE, GLUCOVANCE, AVANDAMET, RIOMET, FORTAMET, Josephine MET, JANUMET, GLUMETZA or METAGLIP, you MAY be asked to  HOLD this medication 48 hours AFTER the exam.  The purpose of you drinking the oral contrast is to aid in the visualization of your intestinal tract. The contrast solution may cause some diarrhea. Depending on your individual set of symptoms, you may also receive an intravenous injection of x-ray contrast/dye. This test typically takes 30-45 minutes to complete.

## 2019-11-20 NOTE — Progress Notes (Signed)
____________________________________________________________  Attending physician addendum:  Thank you for sending this case to me. I have reviewed the entire note, and the outlined plan seems appropriate.  If she has difficulty tolerating the cholestyramine, consider changing to [facility]  Wilfrid Lund, MD  ____________________________________________________________

## 2019-11-24 ENCOUNTER — Other Ambulatory Visit: Payer: Self-pay

## 2019-11-24 ENCOUNTER — Telehealth: Payer: Self-pay | Admitting: Physician Assistant

## 2019-11-24 DIAGNOSIS — R1032 Left lower quadrant pain: Secondary | ICD-10-CM

## 2019-11-24 NOTE — Telephone Encounter (Signed)
Patient called and wanted to make sure we knew she has an Iodine alg. She knows she gets a rash with the topical but isn't sure if she has had a reaction to IV or PO contrast. Desiree Newer PA and Dr. Loletha Anderson are out and her CT- abd/pelvis with contrast is tomorrow.  As DOD please advise

## 2019-11-24 NOTE — Telephone Encounter (Signed)
Pt called stating that she is scheduled for a Ct scan tomorrow. She wanted to make sure that radiology is aware that she is allergic to iodine.

## 2019-11-24 NOTE — Telephone Encounter (Signed)
So I do not think she can have the contrast IV though it could depend upon what her reactions are so please clarify with her and radiology  If the answer is no IV contrast w/o a steroid prep we would need to reschedule with a prep vs going ahead without IV contrast   Since she was quite tender on 12/24 I favor the latter(CT w/o IV contrast)

## 2019-11-25 ENCOUNTER — Ambulatory Visit (HOSPITAL_COMMUNITY)
Admission: RE | Admit: 2019-11-25 | Discharge: 2019-11-25 | Disposition: A | Discharge status: Home / Self Care | Payer: Medicare Other | Source: Ambulatory Visit | Attending: Physician Assistant | Admitting: Physician Assistant

## 2019-11-25 ENCOUNTER — Other Ambulatory Visit: Payer: Self-pay

## 2019-11-25 DIAGNOSIS — R1032 Left lower quadrant pain: Secondary | ICD-10-CM | POA: Insufficient documentation

## 2019-11-25 DIAGNOSIS — K5730 Diverticulosis of large intestine without perforation or abscess without bleeding: Secondary | ICD-10-CM | POA: Diagnosis not present

## 2019-12-10 ENCOUNTER — Other Ambulatory Visit: Payer: Self-pay | Admitting: Oncology

## 2019-12-11 ENCOUNTER — Other Ambulatory Visit: Payer: Self-pay | Admitting: Adult Health

## 2019-12-11 ENCOUNTER — Other Ambulatory Visit: Payer: Self-pay | Admitting: Gastroenterology

## 2019-12-11 ENCOUNTER — Other Ambulatory Visit: Payer: Self-pay

## 2019-12-11 MED ORDER — AZATHIOPRINE 100 MG PO TABS: 100.0000 mg | ORAL_TABLET | Freq: Every day | ORAL | 3 refills | Status: DC

## 2019-12-16 ENCOUNTER — Other Ambulatory Visit: Payer: Medicare Other

## 2019-12-16 DIAGNOSIS — R1032 Left lower quadrant pain: Secondary | ICD-10-CM | POA: Diagnosis not present

## 2019-12-16 DIAGNOSIS — R197 Diarrhea, unspecified: Secondary | ICD-10-CM

## 2019-12-17 LAB — OVA AND PARASITE EXAMINATION
CONCENTRATE RESULT:: NONE SEEN
MICRO NUMBER:: 10056726
SPECIMEN QUALITY:: ADEQUATE
TRICHROME RESULT:: NONE SEEN

## 2019-12-18 ENCOUNTER — Ambulatory Visit: Payer: Medicare Other | Admitting: Physician Assistant

## 2019-12-18 ENCOUNTER — Encounter: Payer: Self-pay | Admitting: Physician Assistant

## 2019-12-18 ENCOUNTER — Ambulatory Visit: Payer: Self-pay | Admitting: Physician Assistant

## 2019-12-18 VITALS — BP 110/92 | HR 84 | Temp 97.0°F | Ht 62.5 in | Wt 178.2 lb

## 2019-12-18 DIAGNOSIS — Z01818 Encounter for other preprocedural examination: Secondary | ICD-10-CM

## 2019-12-18 DIAGNOSIS — R194 Change in bowel habit: Secondary | ICD-10-CM

## 2019-12-18 DIAGNOSIS — R1032 Left lower quadrant pain: Secondary | ICD-10-CM

## 2019-12-18 MED ORDER — NA SULFATE-K SULFATE-MG SULF 17.5-3.13-1.6 GM/177ML PO SOLN: 1.0000 | Freq: Once | ORAL | 0 refills | Status: AC

## 2019-12-18 NOTE — Patient Instructions (Addendum)
If you are age 62 or older, your body mass index should be between 23-30. Your Body mass index is 32.07 kg/m. If this is out of the aforementioned range listed, please consider follow up with your Primary Care Provider.  If you are age 45 or younger, your body mass index should be between 19-25. Your Body mass index is 32.07 kg/m. If this is out of the aformentioned range listed, please consider follow up with your Primary Care Provider.   You have been scheduled for a colonoscopy. Please follow written instructions given to you at your visit today.  Please pick up your prep supplies at the pharmacy within the next 1-3 days. If you use inhalers (even only as needed), please bring them with you on the day of your procedure.  Continue Hyoscyamine and Miralax as needed.

## 2019-12-18 NOTE — Progress Notes (Signed)
____________________________________________________________  Attending physician addendum:  Thank you for sending this case to me. I have reviewed the entire note, and the outlined plan seems appropriate.  With the recent behavior of this condition, seems increasingly likely to be from her underlying functional bowel disorder.  Wilfrid Lund, MD  ____________________________________________________________

## 2019-12-18 NOTE — Progress Notes (Signed)
Chief Complaint: Follow-up left lower quadrant abdominal pain and diarrhea  HPI:    Desiree Anderson is a 64 year old female, known to Dr. Rachael Darby, who returns to clinic today for follow-up of her left lower quadrant abdominal pain and diarrhea.    11/20/2019 patient seen in clinic and described diarrhea greater than 10 stools before lunch and often up to 20-30 stools a day for a few years which are gotten worse over the past 2 months.  Along with this describes some left lower quadrant pain.  At that time due to severity of her pain on exam a CT abdomen pelvis was done with contrast.  Also ordered extensive stool studies including GI pathogen panel, fecal pancreatic elastase, lactoferrin and O&P.  Patient was started on cholestyramine 4 g packets twice daily.  It was recommended that she have a colonoscopy for further evaluation of IBD if stool studies and imaging were negative.  Also briefly discussed putting her on schedule dicyclomine 20 mg 4 times a day.  It was recommended he wait to see if cholestyramine helped before adding this medicine.    11/25/2019 CT abdomen pelvis without contrast showed sigmoid diverticulosis without evidence of diverticulitis and hepatic steatosis.    12/16/2019 stool studies including ova and parasites and stool stool lactoferrin were negative/normal.  GI pathogen panel and pancreatic elastase are still pending.    Today, the patient tells me that she is better in some aspects today.  Explains that ever since drinking the contrast for the CT and having 2 bouts of very urgent diarrhea afterwards she has had no diarrhea since.  In fact, she has sometimes battled with constipation over the past couple weeks.  Explains that she will still tend to have some mushy solid stool after eating on most days.  She is currently avoiding cheese, greasy food and fried food but has been doing this for years and it never made a difference before.  Did use the Cholestyramine a few times when she  was having diarrhea and it seemed to help.  Currently, her largest complaint is of this radical change in bowel habits as well as a continued left lower quadrant discomfort which is constant for her.  It is rated as a 6-7/10 constantly and sometimes is worse.  Tells me she can feel this all the time and it feels like a cramping in this area.  Does use Hyoscyamine occasionally which often helps with this pain.    Denies fever, chills, weight loss or symptoms that awaken her from sleep.  Past Medical History:  Diagnosis Date  . Anxiety   . Breast cancer ([place])   . Complication of anesthesia    hypotension  . DDD (degenerative disc disease), lumbar   . Erosion of vaginal mesh ([place])   . Fibromyalgia   . GERD (gastroesophageal reflux disease)   . History of gastric ulcer   . History of radiation therapy 07/06/16- 08/22/16   Right Breast  . Hypertension   . Hypothyroidism   . Personal history of radiation therapy   . Rectocele     Past Surgical History:  Procedure Laterality Date  . BLADDER TACK  2003  . BREAST BIOPSY Right 04/18/2016  . BREAST LUMPECTOMY Right 05/18/2016  . Breast mass removal Left 1985   Benign  . CARDIOVASCULAR STRESS TEST  07-02-2013  DR DALTON MCLEAN   LOW RISK NUCLEAR STUDY WITH A SMALL, MILD APICAL SEPTAL FIXED PERFUSION DEFECT .  GIVEN NORMAL WALL FUNCTION , SUSPECT REPRESENTS  ATTENUATION/  EF 74%/  NO ISCHEMIA  . CATARACT EXTRACTION W/ INTRAOCULAR LENS IMPLANT Left 01/2014  . CATARACT EXTRACTION W/ INTRAOCULAR LENS IMPLANT Right 12/2017   Dr. Manuella Ghazi  . CHOLECYSTECTOMY N/A 12/10/2017   Procedure: LAPAROSCOPIC CHOLECYSTECTOMY;  Surgeon: Rolm Bookbinder, MD;  Location: [facility];  Service: General;  Laterality: N/A;  . CYSTO/  HYDRODISTENTION/  BLADDER BX/   INSTILLATION THERAPY  08-29-2010  . EXCISION OF MESH N/A 03/23/2014   Procedure: EXCISION OF VAGINAL AND PERI-URETHRAL MESH, EXTRACTION FROM URETHRA TO APEX, insertion of XENFORM in the vaginal vault;  Surgeon:  Ailene Rud, MD;  Location: [facility];  Service: Urology;  Laterality: N/A;  . EXCISION VAGINAL CYST N/A 04/06/2014   Procedure: VAGINAL SPECULUM EXAMINATION/POSSIBLE DRAINAGE OF HEMATOMA;  Surgeon: Ailene Rud, MD;  Location: [facility];  Service: Urology;  Laterality: N/A;  . INCONTINENCE SURGERY  2009  &  2005  . RADIOACTIVE SEED GUIDED PARTIAL MASTECTOMY WITH AXILLARY SENTINEL LYMPH NODE BIOPSY Right 05/18/2016   Procedure: RADIOACTIVE SEED GUIDED RIGHT BREAST LUMPECTOMY WITH AXILLARY SENTINEL LYMPH NODE BIOPSY;  Surgeon: Rolm Bookbinder, MD;  Location: [facility];  Service: General;  Laterality: Right;  RADIOACTIVE SEED GUIDED RIGHT BREAST LUMPECTOMY WITH AXILLARY SENTINEL LYMPH NODE BIOPSY  . TRANSTHORACIC ECHOCARDIOGRAM  06-16-2013   GRADE I DIASTOLIC DYSFUNCTION/  EF 55-60%/  MILD AR/  TRIVIAL MR  &  TR  . VAGINAL HYSTERECTOMY  2000   w/  unilateral salpingoophorectomy  . VEIN BYPASS SURGERY  AGE 72   RIGHT LEG    Current Outpatient Medications  Medication Sig Dispense Refill  . ALPRAZolam (XANAX) 0.5 MG tablet TAKE 1/2-1 TABLET BY MOUTH 2-3 TIMES DAILY IF NEEDED FOR ANXIETY ATTACKS AND PLEASE TRY TO LIMIT TO 5 DAYS A WEEK TO AVOID ADDICTION 90 tablet 0  . Ascorbic Acid (VITAMIN C) 1000 MG tablet Take 1,000 mg by mouth daily.    . cetirizine (ZYRTEC) 10 MG tablet Take 10 mg by mouth daily.     . Cholecalciferol (VITAMIN D-3) 5000 UNITS TABS Take 5,000 Units by mouth 2 (two) times daily.     . cholestyramine (QUESTRAN) 4 g packet Take 1 packet (4 g total) by mouth 2 (two) times daily. 60 each 5  . ezetimibe (ZETIA) 10 MG tablet Take 1 tablet daily for Cholesterol 90 tablet 1  . famotidine (PEPCID) 40 MG tablet Take 1 tablet (40 mg total) by mouth 2 (two) times daily. (Patient taking differently: Take 40 mg by mouth as needed. ) 60 tablet 1  . fluticasone (FLONASE) 50 MCG/ACT nasal spray USE TWO SPRAY(S) IN EACH NOSTRIL  ONCE DAILY AS NEEDED 48 g 3  . gabapentin (NEURONTIN) 600 MG tablet Take 1/2 to 1 tablet 2 to 3 x / day as need to control pain 270 tablet 1  . hyoscyamine (LEVSIN SL) 0.125 MG SL tablet DISSOLVE 1 TABLET IN MOUTH EVERY 6 HOURS AS NEEDED 60 tablet 0  . levothyroxine (SYNTHROID) 100 MCG tablet Take 1 tablet (100 mcg total) by mouth daily. on empty stomach. 90 tablet 1  . Multiple Vitamins-Minerals (GERITABS PO) Take 1 tablet by mouth daily. Reported on 06/08/2016    . potassium chloride SA (KLOR-CON) 20 MEQ tablet Take 1 tablet 2 x /day for Potassium 180 tablet 3  . rosuvastatin (CRESTOR) 20 MG tablet Take 1 tablet Daily for Cholesterol 90 tablet 3  . tamoxifen (NOLVADEX) 20 MG tablet Take 1 tablet by mouth once daily  90 tablet 0  . venlafaxine XR (EFFEXOR-XR) 37.5 MG 24 hr capsule Take 1 capsule (37.5 mg total) by mouth daily with breakfast. 90 capsule 4  . zinc gluconate 50 MG tablet Take 50 mg by mouth daily.     No current facility-administered medications for this visit.    Allergies as of 12/18/2019 - Review Complete 11/20/2019  Allergen Reaction Noted  . Iodine  11/20/2019  . Lipitor [atorvastatin] Other (See Comments) 01/05/2016  . Shellfish allergy Diarrhea and Nausea And Vomiting 09/18/2012  . Betadine [povidone iodine] Rash 05/10/2016    Family History  Problem Relation Age of Onset  . Diabetes Mother   . Diverticulitis Mother   . Lung cancer Brother   . Intestinal polyp Sister     Social History   Socioeconomic History  . Marital status: Married    Spouse name: Not on file  . Number of children: 2  . Years of education: Not on file  . Highest education level: Not on file  Occupational History  . Not on file  Tobacco Use  . Smoking status: Former Smoker    Years: 20.00    Types: Cigarettes    Quit date: 11/28/1995    Years since quitting: 24.0  . Smokeless tobacco: Current User    Types: Snuff  . Tobacco comment: dips daily  Substance and Sexual Activity  .  Alcohol use: No  . Drug use: No  . Sexual activity: Not on file  Other Topics Concern  . Not on file  Social History Narrative  . Not on file   Social Determinants of Health   Financial Resource Strain:   . Difficulty of Paying Living Expenses: Not on file  Food Insecurity:   . Worried About Charity fundraiser in the Last Year: Not on file  . Ran Out of Food in the Last Year: Not on file  Transportation Needs:   . Lack of Transportation (Medical): Not on file  . Lack of Transportation (Non-Medical): Not on file  Physical Activity:   . Days of Exercise per Week: Not on file  . Minutes of Exercise per Session: Not on file  Stress:   . Feeling of Stress : Not on file  Social Connections:   . Frequency of Communication with Friends and Family: Not on file  . Frequency of Social Gatherings with Friends and Family: Not on file  . Attends Religious Services: Not on file  . Active Member of Clubs or Organizations: Not on file  . Attends Archivist Meetings: Not on file  . Marital Status: Not on file  Intimate Partner Violence:   . Fear of Current or Ex-Partner: Not on file  . Emotionally Abused: Not on file  . Physically Abused: Not on file  . Sexually Abused: Not on file    Review of Systems:    Constitutional: No weight loss, fever or chills Cardiovascular: No chest pain Respiratory: No SOB Gastrointestinal: See HPI and otherwise negative   Physical Exam:  Vital signs: BP (!) 110/92   Pulse 84   Temp (!) 97 F (36.1 C)   Ht 5' 2.5" (1.588 m)   Wt 178 lb 3.2 oz (80.8 kg)   BMI 32.07 kg/m   Constitutional:   Pleasant Caucasian female appears to be in NAD, Well developed, Well nourished, alert and cooperative Respiratory: Respirations even and unlabored. Lungs clear to auscultation bilaterally.   No wheezes, crackles, or rhonchi.  Cardiovascular: Normal S1, S2. No MRG.  Regular rate and rhythm. No peripheral edema, cyanosis or pallor.  Gastrointestinal:  Soft,  nondistended, Moderate LLQ ttp, No rebound or guarding. Normal bowel sounds. No appreciable masses or hepatomegaly. Rectal:  Not performed.  Psychiatric: Demonstrates good judgement and reason without abnormal affect or behaviors.  RELEVANT LABS AND IMAGING: CBC    Component Value Date/Time   WBC 7.9 10/21/2019 1151   RBC 4.93 10/21/2019 1151   HGB 13.3 10/21/2019 1151   HGB 13.6 07/03/2017 1241   HCT 41.2 10/21/2019 1151   HCT 41.9 07/03/2017 1241   PLT 225 10/21/2019 1151   PLT 257 07/03/2017 1241   MCV 83.6 10/21/2019 1151   MCV 85.3 07/03/2017 1241   MCH 27.0 10/21/2019 1151   MCHC 32.3 10/21/2019 1151   RDW 12.6 10/21/2019 1151   RDW 13.6 07/03/2017 1241   LYMPHSABS 4,155 (H) 10/21/2019 1151   LYMPHSABS 5.6 (H) 07/03/2017 1241   MONOABS 0.7 07/03/2017 1241   EOSABS 87 10/21/2019 1151   EOSABS 0.0 07/03/2017 1241   BASOSABS 32 10/21/2019 1151   BASOSABS 0.0 07/03/2017 1241    CMP     Component Value Date/Time   NA 144 10/21/2019 1151   NA 144 07/03/2017 1241   K 4.6 10/21/2019 1151   K 3.8 07/03/2017 1241   CL 105 10/21/2019 1151   CO2 25 10/21/2019 1151   CO2 27 07/03/2017 1241   GLUCOSE 94 10/21/2019 1151   GLUCOSE 142 (H) 07/03/2017 1241   BUN 9 10/21/2019 1151   BUN 11.2 07/03/2017 1241   CREATININE 0.55 10/21/2019 1151   CREATININE 0.9 07/03/2017 1241   CALCIUM 9.2 10/21/2019 1151   CALCIUM 9.2 07/03/2017 1241   PROT 6.2 10/21/2019 1151   PROT 6.7 07/03/2017 1241   ALBUMIN 3.4 (L) 12/10/2017 0749   ALBUMIN 3.7 07/03/2017 1241   AST 28 10/21/2019 1151   AST 22 07/03/2017 1241   ALT 28 10/21/2019 1151   ALT 37 07/03/2017 1241   ALKPHOS 49 12/10/2017 0749   ALKPHOS 55 07/03/2017 1241   BILITOT 0.4 10/21/2019 1151   BILITOT 0.29 07/03/2017 1241   GFRNONAA 101 10/21/2019 1151   GFRAA 117 10/21/2019 1151    Assessment: 1.  Change in bowel habits: Was having chronic diarrhea for 2 years up to 20 stools a day, now however since drinking contrast for  CT has gone the other way; most likely IBS with mixture of bile salt diarrhea at times 2.  Left lower quadrant pain: Continues, chronic for the patient, recent CT of the abdomen without contrast was normal, labs normal, Hyoscyamine does help some; consider IBS versus other  Plan: 1.  Scheduled patient for diagnostic colonoscopy for left lower quadrant pain and change in bowel habits in the [place] with Dr. Loletha Carrow.  Did discuss risks, benefits, limitations and alternatives and the patient agrees to proceed.  Patient will be Covid tested 2 days prior to time of procedure. 2.  Continue to use Hyoscyamine as needed for pain. 3.  Discussed using MiraLAX as needed for constipation. 4.  Patient to follow in clinic per recommendations from Dr. Loletha Carrow after time of procedure.  Ellouise Newer, PA-C [facility] 12/18/2019, 10:34 AM  Cc: Unk Pinto, MD

## 2019-12-26 ENCOUNTER — Other Ambulatory Visit: Payer: Self-pay

## 2019-12-26 ENCOUNTER — Ambulatory Visit (INDEPENDENT_AMBULATORY_CARE_PROVIDER_SITE_OTHER): Payer: Self-pay

## 2019-12-26 ENCOUNTER — Other Ambulatory Visit: Payer: Self-pay | Admitting: Gastroenterology

## 2019-12-26 DIAGNOSIS — Z1159 Encounter for screening for other viral diseases: Secondary | ICD-10-CM

## 2019-12-26 LAB — GASTROINTESTINAL PATHOGEN PANEL PCR
C. difficile Tox A/B, PCR: NOT DETECTED
Campylobacter, PCR: NOT DETECTED
Cryptosporidium, PCR: NOT DETECTED
E coli (ETEC) LT/ST PCR: NOT DETECTED
E coli (STEC) stx1/stx2, PCR: NOT DETECTED
E coli 0157, PCR: NOT DETECTED
Giardia lamblia, PCR: NOT DETECTED
Norovirus, PCR: NOT DETECTED
Rotavirus A, PCR: NOT DETECTED
Salmonella, PCR: NOT DETECTED
Shigella, PCR: NOT DETECTED

## 2019-12-26 LAB — FECAL LACTOFERRIN, QUANT
Fecal Lactoferrin: NEGATIVE
MICRO NUMBER:: 10056824
SPECIMEN QUALITY:: ADEQUATE

## 2019-12-26 LAB — PANCREATIC ELASTASE, FECAL: Pancreatic Elastase-1, Stool: >500 mcg/g

## 2019-12-29 LAB — SARS CORONAVIRUS 2 (TAT 6-24 HRS): SARS Coronavirus 2: NEGATIVE

## 2019-12-30 ENCOUNTER — Ambulatory Visit (AMBULATORY_SURGERY_CENTER): Payer: Medicare Other | Admitting: Gastroenterology

## 2019-12-30 ENCOUNTER — Encounter: Payer: Self-pay | Admitting: Gastroenterology

## 2019-12-30 ENCOUNTER — Other Ambulatory Visit: Payer: Self-pay

## 2019-12-30 VITALS — BP 113/74 | HR 73 | Temp 96.9°F | Resp 20 | Ht 62.0 in | Wt 178.0 lb

## 2019-12-30 DIAGNOSIS — R194 Change in bowel habit: Secondary | ICD-10-CM | POA: Diagnosis not present

## 2019-12-30 DIAGNOSIS — K529 Noninfective gastroenteritis and colitis, unspecified: Secondary | ICD-10-CM | POA: Diagnosis not present

## 2019-12-30 DIAGNOSIS — R1032 Left lower quadrant pain: Secondary | ICD-10-CM | POA: Diagnosis not present

## 2019-12-30 MED ORDER — SODIUM CHLORIDE 0.9 % IV SOLN: 500.0000 mL | INTRAVENOUS | Status: DC

## 2019-12-30 NOTE — Progress Notes (Signed)
PT taken to PACU. Monitors in place. VSS. Report given to RN. 

## 2019-12-30 NOTE — Patient Instructions (Addendum)
YOU HAD AN ENDOSCOPIC PROCEDURE TODAY AT [facility]:   Refer to the procedure report that was given to you for any specific questions about what was found during the examination.  If the procedure report does not answer your questions, please call your gastroenterologist to clarify.  If you requested that your care partner not be given the details of your procedure findings, then the procedure report has been included in a sealed envelope for you to review at your convenience later.  YOU SHOULD EXPECT: Some feelings of bloating in the abdomen. Passage of more gas than usual.  Walking can help get rid of the air that was put into your GI tract during the procedure and reduce the bloating. If you had a lower endoscopy (such as a colonoscopy or flexible sigmoidoscopy) you may notice spotting of blood in your stool or on the toilet paper. If you underwent a bowel prep for your procedure, you may not have a normal bowel movement for a few days.  Please Note:  You might notice some irritation and congestion in your nose or some drainage.  This is from the oxygen used during your procedure.  There is no need for concern and it should clear up in a day or so.  SYMPTOMS TO REPORT IMMEDIATELY:   Following lower endoscopy (colonoscopy or flexible sigmoidoscopy):  Excessive amounts of blood in the stool  Significant tenderness or worsening of abdominal pains  Swelling of the abdomen that is new, acute  Fever of 100F or higher  For urgent or emergent issues, a gastroenterologist can be reached at any hour by calling [phone].   DIET:  We do recommend a small meal at first, but then you may proceed to your regular diet.  Drink plenty of fluids but you should avoid alcoholic beverages for 24 hours.  ACTIVITY:  You should plan to take it easy for the rest of today and you should NOT DRIVE or use heavy machinery until tomorrow (because of the sedation medicines used during the test).     FOLLOW UP: Our staff will call the number listed on your records 48-72 hours following your procedure to check on you and address any questions or concerns that you may have regarding the information given to you following your procedure. If we do not reach you, we will leave a message.  We will attempt to reach you two times.  During this call, we will ask if you have developed any symptoms of COVID 19. If you develop any symptoms (ie: fever, flu-like symptoms, shortness of breath, cough etc.) before then, please call [phone].  If you test positive for Covid 19 in the 2 weeks post procedure, please call and report this information to [place].    If any biopsies were taken you will be contacted by phone or by letter within the next 1-3 weeks.  Please call us at [phone] if you have not heard about the biopsies in 3 weeks.   Food Guidelines for those with chronic digestive trouble:  Many people have difficulty digesting certain foods, causing a variety of distressing and embarrassing symptoms such as abdominal pain, bloating and gas.  These foods may need to be avoided or consumed in small amounts.  Here are some tips that might be helpful for you.  1.   Lactose intolerance is the difficulty or complete inability to digest lactose, the natural sugar in milk and anything made from milk.  This condition is harmless, common,  and can begin any time during life.  Some people can digest a modest amount of lactose while others cannot tolerate any.  Also, not all dairy products contain equal amounts of lactose.  For example, hard cheeses such as parmesan have less lactose than soft cheeses such as cheddar.  Yogurt has less lactose than milk or cheese.  Many packaged foods (even many brands of bread) have milk, so read ingredient lists carefully.  It is difficult to test for lactose intolerance, so just try avoiding lactose as much as possible for a week and see what happens with your symptoms.  If you  seem to be lactose intolerant, the best plan is to avoid it (but make sure you get calcium from another source).  The next best thing is to use lactase enzyme supplements, available over the counter everywhere.  Just know that many lactose intolerant people need to take several tablets with each serving of dairy to avoid symptoms.  Lastly, a lot of restaurant food is made with milk or butter.  Many are things you might not suspect, such as mashed potatoes, rice and pasta (cooked with butter) and "grilled" items.  If you are lactose intolerant, it never hurts to ask your server what has milk or butter.  2.   Fiber is an important part of your diet, but not all fiber is well-tolerated.  Insoluble fiber such as bran is often consumed by normal gut bacteria and converted into gas.  Soluble fiber such as oats, squash, carrots and green beans are typically tolerated better.  3.   Some types of carbohydrates can be poorly digested.  Examples include: fructose (apples, cherries, pears, raisins and other dried fruits), fructans (onions, zucchini, large amounts of wheat), sorbitol/mannitol/xylitol and sucralose/Splenda (common artificial sweeteners), and raffinose (lentils, broccoli, cabbage, asparagus, brussel sprouts, many types of beans).  Do a Development worker, community for [employer] and you will find helpful information. Beano, a dietary supplement, will often help with raffinose-containing foods.  As with lactase tablets, you may need several per serving.  4.   Whenever possible, avoid processed food&meats and chemical additives.  High fructose corn syrup, a common sweetener, may be difficult to digest.  Eggs and soy (comes from the soybean, and added to many foods now) are other common bloating/gassy foods.  5.  Regarding gluten:  gluten is a protein mainly found in wheat, but also rye and barley.  There is a condition called celiac sprue, which is an inflammatory reaction in the small intestine causing a variety of  digestive symptoms.  Blood testing is highly reliable to look for this condition, and sometimes upper endoscopy with small bowel biopsies may be necessary to make the diagnosis.  Many patients who test negative for celiac sprue report improvement in their digestive symptoms when they switch to a gluten-free diet.  However, in these "non-celiac gluten sensitive" patients, the true role of gluten in their symptoms is unclear.  Reducing carbohydrates in general may decrease the gas and bloating caused when gut bacteria consume carbs. Also, some of these patients may actually be intolerant of the baker's yeast in bread products rather than the gluten.  Flatbread and other reduced yeast breads might therefore be tolerated.  There is no specific testing available for most food intolerances, which are discovered mainly by dietary elimination.  Please do not embark on a gluten free diet unless directed by your doctor, as it is highly restrictive, and may lead to nutritional deficiencies if not carefully monitored.  Lastly, beware of internet claims offering "personalized" tests for food intolerances.  Such testing has no reliable scientific evidence to support its reliability and correlation to symptoms.    6.  The best advice is old advice, especially for those with chronic digestive trouble - try to eat "clean".  Balanced diet, avoid processed food, plenty of fruits and vegetables, cut down the sugar, minimal alcohol, avoid tobacco. Make time to care for yourself, get enough sleep, exercise when you can, reduce stress.  Your guts will thank you for it.   - Dr. [facility] _____________________________________________________ SIGNATURES/CONFIDENTIALITY: Dennis Bast and/or your care partner have signed paperwork which will be entered into your electronic medical record.  These signatures attest to the fact that that the information above on your After Visit Summary has been reviewed and is  understood.  Full responsibility of the confidentiality of this discharge information lies with you and/or your care-partner.

## 2019-12-30 NOTE — Progress Notes (Signed)
Called to room to assist during endoscopic procedure.  Patient ID and intended procedure confirmed with present staff. Received instructions for my participation in the procedure from the performing physician.  

## 2019-12-30 NOTE — Op Note (Signed)
[facility] Patient Name: Desiree Anderson Procedure Date: 12/30/2019 8:48 AM MRN: [id number] Endoscopist: Sherilyn Cooter L. Myrtie Neither , MD Age: 64 Referring MD:  Date of Birth: 1956/03/23 Gender: Female Account #: [id number] Procedure:                Colonoscopy Indications:              Abdominal pain in the left lower quadrant, Change                            in bowel habits (chronic, svere diarrhea                            alternating with periods of constipation) - recent                            CT scan with sigmoid diverticulisis without                            diverticulitis Medicines:                Monitored Anesthesia Care Procedure:                Pre-Anesthesia Assessment:                           - Prior to the procedure, a History and Physical                            was performed, and patient medications and                            allergies were reviewed. [name] tolerance of                            previous anesthesia was also reviewed. The risks                            and benefits of the procedure and the sedation                            options and risks were discussed with the patient.                            All questions were answered, and informed consent                            was obtained. Prior Anticoagulants: The patient has                            taken no previous anticoagulant or antiplatelet                            agents. ASA Grade Assessment: II - A patient with  mild systemic disease. After reviewing the risks                            and benefits, the patient was deemed in                            satisfactory condition to undergo the procedure.                           After obtaining informed consent, the colonoscope                            was passed under direct vision. Throughout the                            procedure, the patient's blood pressure, pulse, and                oxygen saturations were monitored continuously. The                            Colonoscope was introduced through the anus and                            advanced to the the terminal ileum, with                            identification of the appendiceal orifice and IC                            valve. The colonoscopy was somewhat difficult due                            to multiple diverticula in the colon. Successful                            completion of the procedure was aided by using                            manual pressure and water inflation. The patient                            tolerated the procedure well. The quality of the                            bowel preparation was good. The terminal ileum,                            ileocecal valve, appendiceal orifice, and rectum                            were photographed. The quality of the bowel                            preparation was evaluated  using the BBPS [facility]                            Bowel Preparation Scale) with scores of: Right                            Colon = 2, Transverse Colon = 2 and Left Colon = 2.                            The total BBPS score equals 6. Scope In: 8:58:47 AM Scope Out: 9:13:45 AM Scope Withdrawal Time: 0 hours 9 minutes 4 seconds  Total Procedure Duration: 0 hours 14 minutes 58 seconds  Findings:                 The perianal and digital rectal examinations were                            normal.                           The terminal ileum appeared normal.                           Normal mucosa was found in the entire colon.                            Biopsies for histology were taken with a cold                            forceps from the right colon and left colon for                            evaluation of microscopic colitis.                           Multiple diverticula were found in the sigmoid                            colon, with associated haustral thickening  and                            tortuosity.                           Retroflexion in the rectum was not performed due to                            anatomy.                           The exam was otherwise without abnormality. Complications:            No immediate complications. Estimated Blood Loss:     Estimated blood loss was minimal. Impression:               - The examined portion of the ileum was normal.                           -  Normal mucosa in the entire examined colon.                            Biopsied.                           - Diverticulosis in the sigmoid colon.                           - The examination was otherwise normal.                           Overall, symptoms most consistent with IBS. Medical                            therapy made challenging by alternating bowel                            habits. Recommendation:           - Patient has a contact number available for                            emergencies. The signs and symptoms of potential                            delayed complications were discussed with the                            patient. Return to normal activities tomorrow.                            Written discharge instructions were provided to the                            patient.                           - Resume previous diet.                           - Continue present medications.                           - Await pathology results.                           - Repeat colonoscopy in 10 years for screening                            purposes. Huie Ghuman L. Myrtie Neither, MD 12/30/2019 9:20:57 AM This report has been signed electronically.

## 2019-12-30 NOTE — Progress Notes (Signed)
Pt's states no medical or surgical changes since previsit or office visit. 

## 2020-01-01 ENCOUNTER — Telehealth: Payer: Self-pay

## 2020-01-01 NOTE — Telephone Encounter (Signed)
  Follow up Call-  Call back number 12/30/2019  Post procedure Call Back phone  # [phone]  Permission to leave phone message Yes  Some recent data might be hidden     Patient questions:  Do you have a fever, pain , or abdominal swelling? No. Pain Score  0 *  Have you tolerated food without any problems? Yes.    Have you been able to return to your normal activities? Yes.    Do you have any questions about your discharge instructions: Diet   No. Medications  No. Follow up visit  No.  Do you have questions or concerns about your Care? No.  Actions: * If pain score is 4 or above: No action needed, pain <4.  1. Have you developed a fever since your procedure? no  2.   Have you had an respiratory symptoms (SOB or cough) since your procedure? no  3.   Have you tested positive for COVID 19 since your procedure no  4.   Have you had any family members/close contacts diagnosed with the COVID 19 since your procedure?  no   If yes to any of these questions please route to Joylene John, RN and Alphonsa Gin, Therapist, sports.

## 2020-01-21 ENCOUNTER — Ambulatory Visit: Payer: Medicare Other | Admitting: Physician Assistant

## 2020-01-25 ENCOUNTER — Encounter: Payer: Self-pay | Admitting: Internal Medicine

## 2020-01-25 NOTE — Progress Notes (Signed)
Annual Screening/Preventative Visit & Comprehensive Evaluation &  Examination     This very nice 64 y.o.  MWF  presents for a Screening /Preventative Visit & comprehensive evaluation and management of multiple medical co-morbidities.  Patient has been followed for HTN, HLD, Prediabetes  and Vitamin D Deficiency. Patient has hx/o Rt Breast Cancer in 2017 and is followed bt Dr Jana Hakim on Tamoxifen (5 years - 2022).  Patient has GERD controlled with diet & her meds.      Patient has been on SS Disability since 2005 for Fibromyalgia and Chronic Pain Syndrome from DDD/DJD and Interstitial Cystitis. She has tapered off of her Chronic Opioids.          HTN predates since 2004. Patient's BP has been controlled and patient denies any cardiac symptoms as chest pain, palpitations, shortness of breath, dizziness or ankle swelling. Today's BP is borderline elevated high normal - 138/90.      Patient's hyperlipidemia is controlled with diet and Rosuvastatin / Ezetimibe. Patient denies myalgias or other medication SE's. Last lipids were at goal:  Lab Results  Component Value Date   CHOL 131 10/21/2019   HDL 45 (L) 10/21/2019   LDLCALC 65 10/21/2019   TRIG 120 10/21/2019   CHOLHDL 2.9 10/21/2019       Patient has moderate obesity (BMI 31+) and consequent prediabetes (A1c 6.1% / 2011)  and patient denies reactive hypoglycemic symptoms, visual blurring, diabetic polys or paresthesias. Last A1c was not at goal:  Lab Results  Component Value Date   HGBA1C 5.8 (H) 10/21/2019      Patient was dx'd Hypothyroid in 2002 and has been on Thyroid Replacement since.      Finally, patient has history of Vitamin D Deficiency ("11" / 2008)  and last Vitamin D was at goal:  Lab Results  Component Value Date   VD25OH 90 10/21/2019    Current Outpatient Medications on File Prior to Visit  Medication Sig  . ALPRAZolam (XANAX) 0.5 MG tablet TAKE 1/2-1 TABLET BY MOUTH 2-3 TIMES DAILY IF NEEDED FOR ANXIETY ATTACKS  AND PLEASE TRY TO LIMIT TO 5 DAYS A WEEK TO AVOID ADDICTION  . Ascorbic Acid (VITAMIN C) 1000 MG tablet Take 1,000 mg by mouth daily.  . cetirizine (ZYRTEC) 10 MG tablet Take 10 mg by mouth daily.   . Cholecalciferol (VITAMIN D-3) 5000 UNITS TABS Take 5,000 Units by mouth 2 (two) times daily.   . cholestyramine (QUESTRAN) 4 g packet Take 1 packet (4 g total) by mouth 2 (two) times daily.  Marland Kitchen ezetimibe (ZETIA) 10 MG tablet Take 1 tablet daily for Cholesterol  . fluticasone (FLONASE) 50 MCG/ACT nasal spray USE TWO SPRAY(S) IN EACH NOSTRIL ONCE DAILY AS NEEDED  . hyoscyamine (LEVSIN SL) 0.125 MG SL tablet DISSOLVE 1 TABLET IN MOUTH EVERY 6 HOURS AS NEEDED  . levothyroxine (SYNTHROID) 100 MCG tablet Take 1 tablet (100 mcg total) by mouth daily. on empty stomach.  . potassium chloride SA (KLOR-CON) 20 MEQ tablet Take 1 tablet 2 x /day for Potassium  . rosuvastatin (CRESTOR) 20 MG tablet Take 1 tablet Daily for Cholesterol  . tamoxifen (NOLVADEX) 20 MG tablet Take 1 tablet by mouth once daily  . venlafaxine XR (EFFEXOR-XR) 37.5 MG 24 hr capsule Take 1 capsule (37.5 mg total) by mouth daily with breakfast.  . zinc gluconate 50 MG tablet Take 50 mg by mouth daily.  . famotidine (PEPCID) 40 MG tablet Take 1 tablet (40 mg total) by mouth 2 (two) times  daily. (Patient taking differently: Take 40 mg by mouth as needed. )  . gabapentin (NEURONTIN) 600 MG tablet Take 1/2 to 1 tablet 2 to 3 x / day as need to control pain  . Multiple Vitamins-Minerals (GERITABS PO) Take 1 tablet by mouth daily. Reported on 06/08/2016   No current facility-administered medications on file prior to visit.   Allergies  Allergen Reactions  . Iodine   . Lipitor [Atorvastatin] Other (See Comments)    Muscle and legs aches  . Shellfish Allergy Diarrhea and Nausea And Vomiting  . Betadine [Povidone Iodine] Rash   Past Medical History:  Diagnosis Date  . Anxiety   . Breast cancer ([place])   . Complication of anesthesia     hypotension  . DDD (degenerative disc disease), lumbar   . Erosion of vaginal mesh ([place])   . Fibromyalgia   . GERD (gastroesophageal reflux disease)   . History of gastric ulcer   . History of radiation therapy 07/06/16- 08/22/16   Right Breast  . Hypertension   . Hypothyroidism   . Personal history of radiation therapy   . Rectocele    Health Maintenance  Topic Date Due  . Hepatitis C Screening  February 24, 1956  . HIV Screening  11/30/1972  . PAP SMEAR-Modifier  04/11/2019  . Fecal DNA (Cologuard)  03/05/2020  . TETANUS/TDAP  03/15/2021  . MAMMOGRAM  05/27/2021  . INFLUENZA VACCINE  Completed   Immunization History  Administered Date(s) Administered  . Influenza Inj Mdck Quad With Preservative 09/04/2017, 09/30/2018  . Influenza Split 09/18/2013, 09/25/2014, 10/21/2019  . Influenza, Seasonal, Injecte, Preservative Fre 10/11/2015  . Influenza,inj,quad, With Preservative 08/28/2016, 08/27/2017  . Influenza-Unspecified 08/27/2016  . Pneumococcal-Unspecified 11/27/2000  . Tdap 03/16/2011   Cologard  03/05/2017 - Negative  Colon - 02.02.2021 - Dr Loletha Carrow - recc 10 yr f/u - due Feb 2031  Last MGM - 07.01.2020 and Rt Breast U/S on 08.25.2020  Past Surgical History:  Procedure Laterality Date  . BLADDER TACK  2003  . BREAST BIOPSY Right 04/18/2016  . BREAST LUMPECTOMY Right 05/18/2016  . Breast mass removal Left 1985   Benign  . CARDIOVASCULAR STRESS TEST  07-02-2013  DR DALTON MCLEAN   LOW RISK NUCLEAR STUDY WITH A SMALL, MILD APICAL SEPTAL FIXED PERFUSION DEFECT .  GIVEN NORMAL WALL FUNCTION , SUSPECT REPRESENTS ATTENUATION/  EF 74%/  NO ISCHEMIA  . CATARACT EXTRACTION W/ INTRAOCULAR LENS IMPLANT Left 01/2014  . CATARACT EXTRACTION W/ INTRAOCULAR LENS IMPLANT Right 12/2017   Dr. Manuella Ghazi  . CHOLECYSTECTOMY N/A 12/10/2017   Procedure: LAPAROSCOPIC CHOLECYSTECTOMY;  Surgeon: Rolm Bookbinder, MD;  Location: [place];  Service: General;  Laterality: N/A;  . CYSTO/  HYDRODISTENTION/   BLADDER BX/   INSTILLATION THERAPY  08-29-2010  . EXCISION OF MESH N/A 03/23/2014   Procedure: EXCISION OF VAGINAL AND PERI-URETHRAL MESH, EXTRACTION FROM URETHRA TO APEX, insertion of XENFORM in the vaginal vault;  Surgeon: Ailene Rud, MD;  Location: [facility];  Service: Urology;  Laterality: N/A;  . EXCISION VAGINAL CYST N/A 04/06/2014   Procedure: VAGINAL SPECULUM EXAMINATION/POSSIBLE DRAINAGE OF HEMATOMA;  Surgeon: Ailene Rud, MD;  Location[facility];  Service: Urology;  Laterality: N/A;  . INCONTINENCE SURGERY  2009  &  2005  . RADIOACTIVE SEED GUIDED PARTIAL MASTECTOMY WITH AXILLARY SENTINEL LYMPH NODE BIOPSY Right 05/18/2016   Procedure: RADIOACTIVE SEED GUIDED RIGHT BREAST LUMPECTOMY WITH AXILLARY SENTINEL LYMPH NODE BIOPSY;  Surgeon: Rolm Bookbinder, MD;  Location: [facility];  Service: General;  Laterality: Right;  RADIOACTIVE SEED GUIDED RIGHT BREAST LUMPECTOMY WITH AXILLARY SENTINEL LYMPH NODE BIOPSY  . TRANSTHORACIC ECHOCARDIOGRAM  06-16-2013   GRADE I DIASTOLIC DYSFUNCTION/  EF 55-60%/  MILD AR/  TRIVIAL MR  &  TR  . VAGINAL HYSTERECTOMY  2000   w/  unilateral salpingoophorectomy  . VEIN BYPASS SURGERY  AGE 22   RIGHT LEG   Family History  Problem Relation Age of Onset  . Diabetes Mother   . Diverticulitis Mother   . Lung cancer Brother   . Intestinal polyp Sister   . Diverticulitis Sister    Social History   Tobacco Use  . Smoking status: Former Smoker    Years: 20.00    Types: Cigarettes    Quit date: 11/28/1995    Years since quitting: 24.1  . Smokeless tobacco: Current User    Types: Snuff  . Tobacco comment: dips daily  Substance Use Topics  . Alcohol use: No  . Drug use: No    ROS Constitutional: Denies fever, chills, weight loss/gain, headaches, insomnia,  night sweats, and change in appetite. Does c/o fatigue. Eyes: Denies redness, blurred vision, diplopia, discharge, itchy, watery eyes.    ENT: Denies discharge, congestion, post nasal drip, epistaxis, sore throat, earache, hearing loss, dental pain, Tinnitus, Vertigo, Sinus pain, snoring.  Cardio: Denies chest pain, palpitations, irregular heartbeat, syncope, dyspnea, diaphoresis, orthopnea, PND, claudication, edema Respiratory: denies cough, dyspnea, DOE, pleurisy, hoarseness, laryngitis, wheezing.  Gastrointestinal: Denies dysphagia, heartburn, reflux, water brash, pain, cramps, nausea, vomiting, bloating, diarrhea, constipation, hematemesis, melena, hematochezia, jaundice, hemorrhoids Genitourinary: Denies dysuria, frequency, urgency, nocturia, hesitancy, discharge, hematuria, flank pain Breast: Breast lumps, nipple discharge, bleeding.  Musculoskeletal: Denies arthralgia, myalgia, stiffness, Jt. Swelling, pain, limp, and strain/sprain. Denies falls. Skin: Denies puritis, rash, hives, warts, acne, eczema, changing in skin lesion Neuro: No weakness, tremor, incoordination, spasms, paresthesia, pain Psychiatric: Denies confusion, memory loss, sensory loss. Denies Depression. Endocrine: Denies change in weight, skin, hair change, nocturia, and paresthesia, diabetic polys, visual blurring, hyper / hypo glycemic episodes.  Heme/Lymph: No excessive bleeding, bruising, enlarged lymph nodes.  Physical Exam  BP 138/90   Pulse 88   Temp (!) 97.3 F (36.3 C)   Resp 16   Ht 5\' 3"  (1.6 m)   Wt 178 lb 6.4 oz (80.9 kg)   BMI 31.60 kg/m   General Appearance: Over nourished, well groomed and in no apparent distress.  Eyes: PERRLA, EOMs, conjunctiva no swelling or erythema, normal fundi and vessels. Sinuses: No frontal/maxillary tenderness ENT/Mouth: EACs patent / TMs  nl. Nares clear without erythema, swelling, mucoid exudates. Oral hygiene is good. No erythema, swelling, or exudate. Tongue normal, non-obstructing. Tonsils not swollen or erythematous. Hearing normal.  Neck: Supple, thyroid not palpable. No bruits, nodes or  JVD. Respiratory: Respiratory effort normal.  BS equal and clear bilateral without rales, rhonci, wheezing or stridor. Cardio: Heart sounds are normal with regular rate and rhythm and no murmurs, rubs or gallops. Peripheral pulses are normal and equal bilaterally without edema. No aortic or femoral bruits. Chest: symmetric with normal excursions and percussion. Breasts: Symmetric, without lumps, nipple discharge, retractions, or fibrocystic changes.  Abdomen: Flat, soft with bowel sounds active. Nontender, no guarding, rebound, hernias, masses, or organomegaly.  Lymphatics: Non tender without lymphadenopathy.  Genitourinary:  Musculoskeletal: Full ROM all peripheral extremities, joint stability, 5/5 strength, and normal gait. Skin: Warm and dry without rashes, lesions, cyanosis, clubbing or  ecchymosis.  Neuro: Cranial nerves intact,  reflexes equal bilaterally. Normal muscle tone, no cerebellar symptoms. Sensation intact.  Pysch: Alert and oriented X 3, normal affect, Insight and Judgment appropriate.   Assessment and Plan  1. Annual Preventative Screening Examination  2. Essential hypertension  - EKG 12-Lead - Urinalysis, Routine w reflex microscopic - Microalbumin / creatinine urine ratio - CBC with Differential/Platelet - COMPLETE METABOLIC PANEL WITH GFR - Magnesium - TSH  3. Hyperlipidemia, mixed  - EKG 12-Lead - Lipid panel - TSH  4. Abnormal glucose  - EKG 12-Lead - Hemoglobin A1c - Insulin, random  5. Vitamin D deficiency  - VITAMIN D 25 Hydroxy  6. Prediabetes  - EKG 12-Lead - Hemoglobin A1c - Insulin, random  7. Gastroesophageal reflux disease  - CBC with Differential/Platelet  8. History of breast cancer  9. Fibromyalgia Syndrome  10. Screening for ischemic heart disease  - EKG 12-Lead  11. Former smoker  - EKG 12-Lead  12. Medication management  - Urinalysis, Routine w reflex microscopic - Microalbumin / creatinine urine ratio - CBC  with Differential/Platelet - COMPLETE METABOLIC PANEL WITH GFR - Magnesium - Lipid panel - TSH - Hemoglobin A1c - Insulin, random - VITAMIN D 25 Hydroxy           Patient was counseled in prudent diet to achieve/maintain BMI less than 25 for weight control, BP monitoring, regular exercise and medications. Discussed med's effects and SE's. Screening labs and tests as requested with regular follow-up as recommended. Over 40 minutes of exam, counseling, chart review and high complex critical decision making was performed.   Kirtland Bouchard, MD

## 2020-01-25 NOTE — Patient Instructions (Signed)

## 2020-01-26 ENCOUNTER — Ambulatory Visit (INDEPENDENT_AMBULATORY_CARE_PROVIDER_SITE_OTHER): Payer: Medicare Other | Admitting: Internal Medicine

## 2020-01-26 ENCOUNTER — Other Ambulatory Visit: Payer: Self-pay

## 2020-01-26 VITALS — BP 138/90 | HR 88 | Temp 97.3°F | Resp 16 | Ht 63.0 in | Wt 178.4 lb

## 2020-01-26 DIAGNOSIS — R7309 Other abnormal glucose: Secondary | ICD-10-CM

## 2020-01-26 DIAGNOSIS — Z136 Encounter for screening for cardiovascular disorders: Secondary | ICD-10-CM | POA: Diagnosis not present

## 2020-01-26 DIAGNOSIS — I10 Essential (primary) hypertension: Secondary | ICD-10-CM | POA: Diagnosis not present

## 2020-01-26 DIAGNOSIS — K219 Gastro-esophageal reflux disease without esophagitis: Secondary | ICD-10-CM

## 2020-01-26 DIAGNOSIS — Z853 Personal history of malignant neoplasm of breast: Secondary | ICD-10-CM

## 2020-01-26 DIAGNOSIS — E782 Mixed hyperlipidemia: Secondary | ICD-10-CM

## 2020-01-26 DIAGNOSIS — R7303 Prediabetes: Secondary | ICD-10-CM | POA: Diagnosis not present

## 2020-01-26 DIAGNOSIS — Z87891 Personal history of nicotine dependence: Secondary | ICD-10-CM | POA: Diagnosis not present

## 2020-01-26 DIAGNOSIS — Z0000 Encounter for general adult medical examination without abnormal findings: Secondary | ICD-10-CM | POA: Diagnosis not present

## 2020-01-26 DIAGNOSIS — Z79899 Other long term (current) drug therapy: Secondary | ICD-10-CM

## 2020-01-26 DIAGNOSIS — Z0001 Encounter for general adult medical examination with abnormal findings: Secondary | ICD-10-CM

## 2020-01-26 DIAGNOSIS — E559 Vitamin D deficiency, unspecified: Secondary | ICD-10-CM

## 2020-01-26 DIAGNOSIS — M797 Fibromyalgia: Secondary | ICD-10-CM

## 2020-01-26 MED ORDER — KETOCONAZOLE 2 % EX CREA: TOPICAL_CREAM | CUTANEOUS | 1 refills | Status: DC

## 2020-01-27 LAB — LIPID PANEL
Cholesterol: 125 mg/dL (ref <200)
HDL: 48 mg/dL — ABNORMAL LOW (ref >=50)
LDL Cholesterol (Calc): 53 mg/dL (calc)
Non-HDL Cholesterol (Calc): 77 mg/dL (calc) (ref <130)
Total CHOL/HDL Ratio: 2.6 (calc) (ref <5.0)
Triglycerides: 154 mg/dL — ABNORMAL HIGH (ref <150)

## 2020-01-27 LAB — URINALYSIS, ROUTINE W REFLEX MICROSCOPIC
Bilirubin Urine: NEGATIVE
Glucose, UA: NEGATIVE
Hgb urine dipstick: NEGATIVE
Ketones, ur: NEGATIVE
Leukocytes,Ua: NEGATIVE
Nitrite: NEGATIVE
Protein, ur: NEGATIVE
Specific Gravity, Urine: 1.005 (ref 1.001–1.03)
pH: 6.5 (ref 5.0–8.0)

## 2020-01-27 LAB — COMPLETE METABOLIC PANEL WITH GFR
AG Ratio: 1.7 (calc) (ref 1.0–2.5)
ALT: 31 U/L — ABNORMAL HIGH (ref 6–29)
AST: 39 U/L — ABNORMAL HIGH (ref 10–35)
Albumin: 4.1 g/dL (ref 3.6–5.1)
Alkaline phosphatase (APISO): 63 U/L (ref 37–153)
BUN: 10 mg/dL (ref 7–25)
CO2: 26 mmol/L (ref 20–32)
Calcium: 9.4 mg/dL (ref 8.6–10.4)
Chloride: 103 mmol/L (ref 98–110)
Creat: 0.55 mg/dL (ref 0.50–0.99)
GFR, Est African American: 117 mL/min/{1.73_m2} (ref >=60)
GFR, Est Non African American: 101 mL/min/{1.73_m2} (ref >=60)
Globulin: 2.4 g/dL (calc) (ref 1.9–3.7)
Glucose, Bld: 68 mg/dL (ref 65–99)
Potassium: 4.2 mmol/L (ref 3.5–5.3)
Sodium: 141 mmol/L (ref 135–146)
Total Bilirubin: 0.3 mg/dL (ref 0.2–1.2)
Total Protein: 6.5 g/dL (ref 6.1–8.1)

## 2020-01-27 LAB — CBC WITH DIFFERENTIAL/PLATELET
Absolute Monocytes: 495 cells/uL (ref 200–950)
Basophils Absolute: 40 cells/uL (ref 0–200)
Basophils Relative: 0.4 %
Eosinophils Absolute: 81 cells/uL (ref 15–500)
Eosinophils Relative: 0.8 %
HCT: 42.9 % (ref 35.0–45.0)
Hemoglobin: 13.9 g/dL (ref 11.7–15.5)
Lymphs Abs: 4515 cells/uL — ABNORMAL HIGH (ref 850–3900)
MCH: 27 pg (ref 27.0–33.0)
MCHC: 32.4 g/dL (ref 32.0–36.0)
MCV: 83.3 fL (ref 80.0–100.0)
MPV: 10.3 fL (ref 7.5–12.5)
Monocytes Relative: 4.9 %
Neutro Abs: 4969 cells/uL (ref 1500–7800)
Neutrophils Relative %: 49.2 %
Platelets: 265 10*3/uL (ref 140–400)
RBC: 5.15 10*6/uL — ABNORMAL HIGH (ref 3.80–5.10)
RDW: 13.2 % (ref 11.0–15.0)
Total Lymphocyte: 44.7 %
WBC: 10.1 10*3/uL (ref 3.8–10.8)

## 2020-01-27 LAB — MAGNESIUM: Magnesium: 2 mg/dL (ref 1.5–2.5)

## 2020-01-27 LAB — TSH: TSH: 0.42 mIU/L (ref 0.40–4.50)

## 2020-01-27 LAB — VITAMIN D 25 HYDROXY (VIT D DEFICIENCY, FRACTURES): Vit D, 25-Hydroxy: 75 ng/mL (ref 30–100)

## 2020-01-27 LAB — MICROALBUMIN / CREATININE URINE RATIO
Creatinine, Urine: 31 mg/dL (ref 20–275)
Microalb, Ur: <0.2 mg/dL

## 2020-01-27 LAB — HEMOGLOBIN A1C
Hgb A1c MFr Bld: 5.9 % of total Hgb — ABNORMAL HIGH (ref <5.7)
Mean Plasma Glucose: 123 (calc)
eAG (mmol/L): 6.8 (calc)

## 2020-01-27 LAB — INSULIN, RANDOM: Insulin: 27.2 u[IU]/mL — ABNORMAL HIGH

## 2020-01-29 ENCOUNTER — Ambulatory Visit: Payer: Medicare Other | Admitting: Physician Assistant

## 2020-01-29 ENCOUNTER — Encounter: Payer: Self-pay | Admitting: Physician Assistant

## 2020-01-29 VITALS — BP 120/80 | HR 81 | Temp 98.6°F | Ht 62.25 in | Wt 175.0 lb

## 2020-01-29 DIAGNOSIS — K582 Mixed irritable bowel syndrome: Secondary | ICD-10-CM | POA: Diagnosis not present

## 2020-01-29 DIAGNOSIS — R1032 Left lower quadrant pain: Secondary | ICD-10-CM | POA: Diagnosis not present

## 2020-01-29 MED ORDER — DICYCLOMINE HCL 10 MG PO CAPS: 10.0000 mg | ORAL_CAPSULE | Freq: Three times a day (TID) | ORAL | 5 refills | Status: DC

## 2020-01-29 NOTE — Progress Notes (Signed)
Chief Complaint: Follow-up left lower quadrant abdominal pain, diarrhea  HPI:    [name] is a 64 year old female with a past medical history as listed below, known to Dr. Simona Huh, who returns again today for follow-up of her left lower quadrant abdominal pain.    12/18/2019 patient seen in clinic with left lower quadrant abdominal pain and diarrhea.At time of follow-up patient was doing better ever since drinking the contrast for the CT.  Did use the cholestyramine a few times and it helped with the diarrhea.  At that time scheduled for colon and continued on hyoscyamine as needed for pain.    12/30/2019 colonoscopy for change in bowel habits and left lower abdominal pain.  At that time patient previously been seen 12/24 with diarrhea greater than 10 stools before lunch and often up to 20-30 stools a day for few years which she gotten worse over the past 2 months.  CT the abdomen pelvis was done with contrast and was normal.  Stool studies were normal.  Patient was given cholestyramine twice daily.  Impression showed examined portion of the ileum was normal, normal mucosa in the entire examined colon, diverticulosis in the sigmoid colon otherwise normal.  Discussed that overall symptoms are consistent with IBS.  Repeat recommended in 10 years.  Biopsies were normal.      Today, the patient presents clinic and explains that she feels like she is doing better.  She is using a half a packet of Cholestyramine every other day which is helping to keep her bowel movements more normal.  She still will have sometimes 4-6 bowel movements a day though and typically these are urgent after eating.  Also continues to complain of left lower quadrant pain which is rated as a 6-7/10 and seems to be best when she is on a completely empty stomach.  This does worsen before bowel movement but does not go away afterwards.  Tells me the Hyoscyamine did help when she used it occasionally.    Social history positive for being from  a family of 8 children that her mother was pregnant 12 times, excited that she gets to watch her new baby niece over the weekend.    Denies fever, chills, blood in her stool or symptoms that awaken her from sleep.  Past Medical History:  Diagnosis Date  . Anxiety   . Breast cancer ([place])   . Complication of anesthesia    hypotension  . DDD (degenerative disc disease), lumbar   . Erosion of vaginal mesh ([place])   . Fibromyalgia   . GERD (gastroesophageal reflux disease)   . History of gastric ulcer   . History of radiation therapy 07/06/16- 08/22/16   Right Breast  . Hypertension   . Hypothyroidism   . Personal history of radiation therapy   . Rectocele     Past Surgical History:  Procedure Laterality Date  . BLADDER TACK  2003  . BREAST BIOPSY Right 04/18/2016  . BREAST LUMPECTOMY Right 05/18/2016  . Breast mass removal Left 1985   Benign  . CARDIOVASCULAR STRESS TEST  07-02-2013  DR DALTON MCLEAN   LOW RISK NUCLEAR STUDY WITH A SMALL, MILD APICAL SEPTAL FIXED PERFUSION DEFECT .  GIVEN NORMAL WALL FUNCTION , SUSPECT REPRESENTS ATTENUATION/  EF 74%/  NO ISCHEMIA  . CATARACT EXTRACTION W/ INTRAOCULAR LENS IMPLANT Left 01/2014  . CATARACT EXTRACTION W/ INTRAOCULAR LENS IMPLANT Right 12/2017   Dr. Manuella Ghazi  . CHOLECYSTECTOMY N/A 12/10/2017   Procedure: LAPAROSCOPIC CHOLECYSTECTOMY;  Surgeon: Rolm Bookbinder, MD;  Location: [facility];  Service: General;  Laterality: N/A;  . CYSTO/  HYDRODISTENTION/  BLADDER BX/   INSTILLATION THERAPY  08-29-2010  . EXCISION OF MESH N/A 03/23/2014   Procedure: EXCISION OF VAGINAL AND PERI-URETHRAL MESH, EXTRACTION FROM URETHRA TO APEX, insertion of XENFORM in the vaginal vault;  Surgeon: Ailene Rud, MD;  Location: [facility];  Service: Urology;  Laterality: N/A;  . EXCISION VAGINAL CYST N/A 04/06/2014   Procedure: VAGINAL SPECULUM EXAMINATION/POSSIBLE DRAINAGE OF HEMATOMA;  Surgeon: Ailene Rud, MD;  Location: [facility];  Service: Urology;  Laterality: N/A;  . INCONTINENCE SURGERY  2009  &  2005  . RADIOACTIVE SEED GUIDED PARTIAL MASTECTOMY WITH AXILLARY SENTINEL LYMPH NODE BIOPSY Right 05/18/2016   Procedure: RADIOACTIVE SEED GUIDED RIGHT BREAST LUMPECTOMY WITH AXILLARY SENTINEL LYMPH NODE BIOPSY;  Surgeon: Rolm Bookbinder, MD;  Location: [facility];  Service: General;  Laterality: Right;  RADIOACTIVE SEED GUIDED RIGHT BREAST LUMPECTOMY WITH AXILLARY SENTINEL LYMPH NODE BIOPSY  . TRANSTHORACIC ECHOCARDIOGRAM  06-16-2013   GRADE I DIASTOLIC DYSFUNCTION/  EF 55-60%/  MILD AR/  TRIVIAL MR  &  TR  . VAGINAL HYSTERECTOMY  2000   w/  unilateral salpingoophorectomy  . VEIN BYPASS SURGERY  AGE 25   RIGHT LEG    Current Outpatient Medications  Medication Sig Dispense Refill  . ALPRAZolam (XANAX) 0.5 MG tablet TAKE 1/2-1 TABLET BY MOUTH 2-3 TIMES DAILY IF NEEDED FOR ANXIETY ATTACKS AND PLEASE TRY TO LIMIT TO 5 DAYS A WEEK TO AVOID ADDICTION 90 tablet 0  . Ascorbic Acid (VITAMIN C) 1000 MG tablet Take 1,000 mg by mouth daily.    . cetirizine (ZYRTEC) 10 MG tablet Take 10 mg by mouth daily.     . Cholecalciferol (VITAMIN D-3) 5000 UNITS TABS Take 5,000 Units by mouth 2 (two) times daily.     . cholestyramine (QUESTRAN) 4 g packet Take 1 packet (4 g total) by mouth 2 (two) times daily. 60 each 5  . ezetimibe (ZETIA) 10 MG tablet Take 1 tablet daily for Cholesterol 90 tablet 1  . famotidine (PEPCID) 40 MG tablet Take 1 tablet (40 mg total) by mouth 2 (two) times daily. (Patient taking differently: Take 40 mg by mouth as needed. ) 60 tablet 1  . fluticasone (FLONASE) 50 MCG/ACT nasal spray USE TWO SPRAY(S) IN EACH NOSTRIL ONCE DAILY AS NEEDED 48 g 3  . gabapentin (NEURONTIN) 600 MG tablet Take 1/2 to 1 tablet 2 to 3 x / day as need to control pain 270 tablet 1  . hyoscyamine (LEVSIN SL) 0.125 MG SL tablet DISSOLVE 1 TABLET IN MOUTH EVERY 6 HOURS AS NEEDED 60 tablet 0  . ketoconazole  (NIZORAL) 2 % cream Apply Daily to Ringworm Skin  Infection 30 g 1  . levothyroxine (SYNTHROID) 100 MCG tablet Take 1 tablet (100 mcg total) by mouth daily. on empty stomach. 90 tablet 1  . Multiple Vitamins-Minerals (GERITABS PO) Take 1 tablet by mouth daily. Reported on 06/08/2016    . potassium chloride SA (KLOR-CON) 20 MEQ tablet Take 1 tablet 2 x /day for Potassium 180 tablet 3  . rosuvastatin (CRESTOR) 20 MG tablet Take 1 tablet Daily for Cholesterol 90 tablet 3  . tamoxifen (NOLVADEX) 20 MG tablet Take 1 tablet by mouth once daily 90 tablet 0  . venlafaxine XR (EFFEXOR-XR) 37.5 MG 24 hr capsule Take 1 capsule (37.5 mg total) by mouth daily with breakfast. 90  capsule 4  . zinc gluconate 50 MG tablet Take 50 mg by mouth daily.     No current facility-administered medications for this visit.    Allergies as of 01/29/2020 - Review Complete 01/25/2020  Allergen Reaction Noted  . Iodine  11/20/2019  . Lipitor [atorvastatin] Other (See Comments) 01/05/2016  . Shellfish allergy Diarrhea and Nausea And Vomiting 09/18/2012  . Betadine [povidone iodine] Rash 05/10/2016    Family History  Problem Relation Age of Onset  . Diabetes Mother   . Diverticulitis Mother   . Lung cancer Brother   . Intestinal polyp Sister   . Diverticulitis Sister     Social History   Socioeconomic History  . Marital status: Married    Spouse name: Not on file  . Number of children: 2  . Years of education: Not on file  . Highest education level: Not on file  Occupational History  . Not on file  Tobacco Use  . Smoking status: Former Smoker    Years: 20.00    Types: Cigarettes    Quit date: 11/28/1995    Years since quitting: 24.1  . Smokeless tobacco: Current User    Types: Snuff  . Tobacco comment: dips daily  Substance and Sexual Activity  . Alcohol use: No  . Drug use: No  . Sexual activity: Not on file  Other Topics Concern  . Not on file  Social History Narrative  . Not on file   Social  Determinants of Health   Financial Resource Strain:   . Difficulty of Paying Living Expenses: Not on file  Food Insecurity:   . Worried About Charity fundraiser in the Last Year: Not on file  . Ran Out of Food in the Last Year: Not on file  Transportation Needs:   . Lack of Transportation (Medical): Not on file  . Lack of Transportation (Non-Medical): Not on file  Physical Activity:   . Days of Exercise per Week: Not on file  . Minutes of Exercise per Session: Not on file  Stress:   . Feeling of Stress : Not on file  Social Connections:   . Frequency of Communication with Friends and Family: Not on file  . Frequency of Social Gatherings with Friends and Family: Not on file  . Attends Religious Services: Not on file  . Active Member of Clubs or Organizations: Not on file  . Attends Archivist Meetings: Not on file  . Marital Status: Not on file  Intimate Partner Violence:   . Fear of Current or Ex-Partner: Not on file  . Emotionally Abused: Not on file  . Physically Abused: Not on file  . Sexually Abused: Not on file    Review of Systems:    Constitutional: No weight loss, fever or chills Cardiovascular: No chest pain Respiratory: No SOB  Gastrointestinal: See HPI and otherwise negative   Physical Exam:  Vital signs: BP 120/80   Pulse 81   Temp 98.6 F (37 C)   Ht 5' 2.25" (1.581 m)   Wt 175 lb (79.4 kg)   BMI 31.75 kg/m   Constitutional:   Pleasant overweight Caucasian female appears to be in NAD, Well developed, Well nourished, alert and cooperative Respiratory: Respirations even and unlabored. Lungs clear to auscultation bilaterally.   No wheezes, crackles, or rhonchi.  Cardiovascular: Normal S1, S2. No MRG. Regular rate and rhythm. No peripheral edema, cyanosis or pallor.  Gastrointestinal:  Soft, nondistended, mild LLQ ttp. No rebound or guarding.  Normal bowel sounds. No appreciable masses or hepatomegaly. Rectal:  Not performed.  Psychiatric:  Demonstrates good judgement and reason without abnormal affect or behaviors.  MOST RECENT LABS AND IMAGING: CBC    Component Value Date/Time   WBC 10.1 01/26/2020 1524   RBC 5.15 (H) 01/26/2020 1524   HGB 13.9 01/26/2020 1524   HGB 13.6 07/03/2017 1241   HCT 42.9 01/26/2020 1524   HCT 41.9 07/03/2017 1241   PLT 265 01/26/2020 1524   PLT 257 07/03/2017 1241   MCV 83.3 01/26/2020 1524   MCV 85.3 07/03/2017 1241   MCH 27.0 01/26/2020 1524   MCHC 32.4 01/26/2020 1524   RDW 13.2 01/26/2020 1524   RDW 13.6 07/03/2017 1241   LYMPHSABS 4,515 (H) 01/26/2020 1524   LYMPHSABS 5.6 (H) 07/03/2017 1241   MONOABS 0.7 07/03/2017 1241   EOSABS 81 01/26/2020 1524   EOSABS 0.0 07/03/2017 1241   BASOSABS 40 01/26/2020 1524   BASOSABS 0.0 07/03/2017 1241    CMP     Component Value Date/Time   NA 141 01/26/2020 1524   NA 144 07/03/2017 1241   K 4.2 01/26/2020 1524   K 3.8 07/03/2017 1241   CL 103 01/26/2020 1524   CO2 26 01/26/2020 1524   CO2 27 07/03/2017 1241   GLUCOSE 68 01/26/2020 1524   GLUCOSE 142 (H) 07/03/2017 1241   BUN 10 01/26/2020 1524   BUN 11.2 07/03/2017 1241   CREATININE 0.55 01/26/2020 1524   CREATININE 0.9 07/03/2017 1241   CALCIUM 9.4 01/26/2020 1524   CALCIUM 9.2 07/03/2017 1241   PROT 6.5 01/26/2020 1524   PROT 6.7 07/03/2017 1241   ALBUMIN 3.4 (L) 12/10/2017 0749   ALBUMIN 3.7 07/03/2017 1241   AST 39 (H) 01/26/2020 1524   AST 22 07/03/2017 1241   ALT 31 (H) 01/26/2020 1524   ALT 37 07/03/2017 1241   ALKPHOS 49 12/10/2017 0749   ALKPHOS 55 07/03/2017 1241   BILITOT 0.3 01/26/2020 1524   BILITOT 0.29 07/03/2017 1241   GFRNONAA 101 01/26/2020 1524   GFRAA 117 01/26/2020 1524    Assessment: 1.  IBS mixed: With both diarrhea and constipation, currently diarrhea 2.  Left lower quadrant pain: Continues per patient and constant, is worse before a bowel movement, recent colonoscopy with diverticulosis and otherwise normal; consider relation to IBS  Plan: 1.   Reviewed recent colonoscopy.  Discussed the diagnosis of IBS with the patient. 2.  Continue Cholestyramine half packet every other day titrated to formed bowel movements. 3.  Stop Hyoscyamine. 4.  Start Dicyclomine 10 mg 4 times daily, 20 to 30 minutes before meals and at bedtime #120 with a refill.  Discussed with the patient that we can titrate this up/down accordingly. 5.  Patient to follow in clinic with me in 4 to 6 weeks to see how she is doing.  If the Dicyclomine is not helping with the pain she may need further evaluation for other origin.  Ellouise Newer, PA-C [facility] 01/29/2020, 10:52 AM  Cc: Unk Pinto, MD

## 2020-01-29 NOTE — Progress Notes (Signed)
____________________________________________________________  Attending physician addendum:  Thank you for sending this case to me. I have reviewed the entire note, and the outlined plan seems appropriate.  Koleen Celia Danis, MD  ____________________________________________________________  

## 2020-01-29 NOTE — Patient Instructions (Addendum)
If you are age 62 or older, your body mass index should be between 23-30. Your Body mass index is 31.75 kg/m. If this is out of the aforementioned range listed, please consider follow up with your Primary Care Provider.  If you are age 58 or younger, your body mass index should be between 19-25. Your Body mass index is 31.75 kg/m. If this is out of the aformentioned range listed, please consider follow up with your Primary Care Provider.   We have sent the following medications to your pharmacy for you to pick up at your convenience: Dicyclomine 10 mg 20-30 minutes before meals and at bedtime.   Stop Hyoscyamine.   Continue to use Cholestyramine.    Follow up in 4-6 weeks.   Thank you for choosing me and [facility].  Ellouise Newer, [place]

## 2020-02-14 ENCOUNTER — Other Ambulatory Visit: Payer: Self-pay | Admitting: Internal Medicine

## 2020-02-14 DIAGNOSIS — M797 Fibromyalgia: Secondary | ICD-10-CM

## 2020-02-16 ENCOUNTER — Other Ambulatory Visit: Payer: Self-pay

## 2020-02-16 ENCOUNTER — Encounter: Payer: Self-pay | Admitting: Adult Health

## 2020-02-16 ENCOUNTER — Ambulatory Visit (INDEPENDENT_AMBULATORY_CARE_PROVIDER_SITE_OTHER): Payer: Medicare Other | Admitting: Adult Health

## 2020-02-16 VITALS — BP 140/86 | HR 79 | Temp 97.3°F | Wt 177.0 lb

## 2020-02-16 DIAGNOSIS — B372 Candidiasis of skin and nail: Secondary | ICD-10-CM | POA: Diagnosis not present

## 2020-02-16 NOTE — Progress Notes (Signed)
Assessment and Plan:  Desiree Anderson was seen today for acute visit.  Diagnoses and all orders for this visit:  Candidal dermatitis Rash under breast most suggestive of candidal interigo Continue keeping area dry, clean; dry thoroughly after bathing, try applying ketokonazole 2% cream which is already prescribed for separate concern to this area BID, keep 100% cotton towel under breast to help dry and keep skin separated Avoid excessively restrictive clothing Follow up if not improving and can try alternative or oral fluconazole   Further disposition pending results of labs. Discussed med's effects and SE's.   Over 15 minutes of exam, counseling, chart review, and critical decision making was performed.   Future Appointments  Date Time Provider [place]  02/26/2020 11:00 AM Levin Erp, [place] [facility]  05/04/2020 11:15 AM Garnet Sierras, NP [facility] None  07/08/2020 11:00 AM [facility] 1 [facility] None  07/08/2020 11:30 AM Magrinat, Virgie Dad, MD [facility] None  08/09/2020 10:30 AM Unk Pinto, MD [facility] None  02/02/2021  2:00 PM Unk Pinto, MD [facility] None    ------------------------------------------------------------------------------------------------------------------   HPI BP 140/86   Pulse 79   Temp (!) 97.3 F (36.3 C)   Wt 177 lb (80.3 kg)   SpO2 98%   BMI 32.11 kg/m   64 y.o.female with hx of breast cancer on tamoxifen followed by Dr. Jana Hakim, fibromyagia presents for evaluation of rash under her breast. Reports this began approx 2 weeks ago, burning discomfort, has been trying to keep dry and applying gold bond powder without improvement.   Incidentally she is currently prescribed ketokonazole cream for ringworm of leg, hasn't tried this cream on the area.   Past Medical History:  Diagnosis Date  . Anxiety   . Breast cancer ([place])   . Complication of anesthesia    hypotension  . DDD (degenerative disc disease), lumbar    . Erosion of vaginal mesh ([place])   . Fibromyalgia   . GERD (gastroesophageal reflux disease)   . History of gastric ulcer   . History of radiation therapy 07/06/16- 08/22/16   Right Breast  . Hypertension   . Hypothyroidism   . Personal history of radiation therapy   . Rectocele      Allergies  Allergen Reactions  . Iodine   . Lipitor [Atorvastatin] Other (See Comments)    Muscle and legs aches  . Shellfish Allergy Diarrhea and Nausea And Vomiting  . Betadine [Povidone Iodine] Rash    Current Outpatient Medications on File Prior to Visit  Medication Sig  . ALPRAZolam (XANAX) 0.5 MG tablet TAKE 1/2-1 TABLET BY MOUTH 2-3 TIMES DAILY IF NEEDED FOR ANXIETY ATTACKS AND PLEASE TRY TO LIMIT TO 5 DAYS A WEEK TO AVOID ADDICTION  . Ascorbic Acid (VITAMIN C) 1000 MG tablet Take 1,000 mg by mouth daily.  . cetirizine (ZYRTEC) 10 MG tablet Take 10 mg by mouth daily.   . Cholecalciferol (VITAMIN D-3) 5000 UNITS TABS Take 5,000 Units by mouth 2 (two) times daily.   . cholestyramine (QUESTRAN) 4 g packet Take 1 packet (4 g total) by mouth 2 (two) times daily.  Marland Kitchen dicyclomine (BENTYL) 10 MG capsule Take 1 capsule (10 mg total) by mouth 4 (four) times daily -  before meals and at bedtime.  Marland Kitchen ezetimibe (ZETIA) 10 MG tablet Take 1 tablet Daily for Cholesterol  . fluticasone (FLONASE) 50 MCG/ACT nasal spray USE TWO SPRAY(S) IN EACH NOSTRIL ONCE DAILY AS NEEDED  . gabapentin (NEURONTIN) 600 MG tablet Take 1/2 to 1 tablet  2 to 3 x /day as needed to control pain  . hyoscyamine (LEVSIN SL) 0.125 MG SL tablet DISSOLVE 1 TABLET IN MOUTH EVERY 6 HOURS AS NEEDED  . ketoconazole (NIZORAL) 2 % cream Apply Daily to Ringworm Skin  Infection  . levothyroxine (SYNTHROID) 100 MCG tablet Take 1 tablet (100 mcg total) by mouth daily. on empty stomach.  . Multiple Vitamins-Minerals (GERITABS PO) Take 1 tablet by mouth daily. Reported on 06/08/2016  . potassium chloride SA (KLOR-CON) 20 MEQ tablet Take 1 tablet 2 x /day  for Potassium  . rosuvastatin (CRESTOR) 20 MG tablet Take 1 tablet Daily for Cholesterol  . tamoxifen (NOLVADEX) 20 MG tablet Take 1 tablet by mouth once daily  . venlafaxine XR (EFFEXOR-XR) 37.5 MG 24 hr capsule Take 1 capsule (37.5 mg total) by mouth daily with breakfast.  . zinc gluconate 50 MG tablet Take 50 mg by mouth daily.  . famotidine (PEPCID) 40 MG tablet Take 1 tablet (40 mg total) by mouth 2 (two) times daily. (Patient taking differently: Take 40 mg by mouth as needed. )   No current facility-administered medications on file prior to visit.    ROS: all negative except above.   Physical Exam:  BP 140/86   Pulse 79   Temp (!) 97.3 F (36.3 C)   Wt 177 lb (80.3 kg)   SpO2 98%   BMI 32.11 kg/m   General Appearance: Well nourished, in no apparent distress. Eyes: conjunctiva no swelling or erythema ENT/Mouth: Hearing normal.  Neck: Supple, thyroid normal.  Respiratory: Respiratory effort normal, BS equal bilaterally without rales, rhonchi, wheezing or stridor.  Cardio: RRR with no MRGs. Brisk peripheral pulses without edema.  Abdomen: Soft, + BS.  Non tender, no guarding, rebound, hernias, masses. Lymphatics: Non tender without lymphadenopathy.  Musculoskeletal: normal gait.  Skin: Warm, dry; erythematous rash under R pendulous breast with symmetrical disinct borders, no lesions, no discharge Neuro: Normal muscle tone Psych: Awake and oriented X 3, normal affect, Insight and Judgment appropriate.     Izora Ribas, NP 3:06 PM [facility]

## 2020-02-16 NOTE — Patient Instructions (Signed)
  Put cream on twice a day, keep area dry, can put in fluffy towel etc, 100% cotton is best to help dry area out.     Skin Yeast Infection  A skin yeast infection is a condition in which there is an overgrowth of yeast (candida) that normally lives on the skin. This condition usually occurs in areas of the skin that are constantly warm and moist, such as the armpits or the groin. What are the causes? This condition is caused by a change in the normal balance of the yeast and bacteria that live on the skin. What increases the risk? You are more likely to develop this condition if you:  Are obese.  Are pregnant.  Take birth control pills.  Have diabetes.  Take antibiotic medicines.  Take steroid medicines.  Are malnourished.  Have a weak body defense system (immune system).  Are 42 years of age or older.  Wear tight clothing. What are the signs or symptoms? The most common symptom of this condition is itchiness in the affected area. Other symptoms include:  Red, swollen area of the skin.  Bumps on the skin. How is this diagnosed?  This condition is diagnosed with a medical history and physical exam.  Your health care provider may check for yeast by taking light scrapings of the skin to be viewed under a microscope. How is this treated? This condition is treated with medicine. Medicines may be prescribed or be available over the counter. The medicines may be:  Taken by mouth (orally).  Applied as a cream or powder to your skin. Follow these instructions at home:   Take or apply over-the-counter and prescription medicines only as told by your health care provider.  Maintain a healthy weight. If you need help losing weight, talk with your health care provider.  Keep your skin clean and dry.  If you have diabetes, keep your blood sugar under control.  Keep all follow-up visits as told by your health care provider. This is important. Contact a health care provider  if:  Your symptoms go away and then return.  Your symptoms do not get better with treatment.  Your symptoms get worse.  Your rash spreads.  You have a fever or chills.  You have new symptoms.  You have new warmth or redness of your skin. Summary  A skin yeast infection is a condition in which there is an overgrowth of yeast (candida) that normally lives on the skin. This condition is caused by a change in the normal balance of the yeast and bacteria that live on the skin.  Take or apply over-the-counter and prescription medicines only as told by your health care provider.  Keep your skin clean and dry.  Contact a health care provider if your symptoms do not get better with treatment. This information is not intended to replace advice given to you by your health care provider. Make sure you discuss any questions you have with your health care provider. Document Revised: 04/02/2018 Document Reviewed: 04/02/2018 Elsevier Patient Education  [place].

## 2020-02-23 ENCOUNTER — Ambulatory Visit: Payer: Medicare Other | Admitting: Physician Assistant

## 2020-02-26 ENCOUNTER — Telehealth: Payer: Self-pay | Admitting: Physician Assistant

## 2020-02-26 ENCOUNTER — Ambulatory Visit: Payer: Medicare Other | Admitting: Physician Assistant

## 2020-02-26 ENCOUNTER — Other Ambulatory Visit (INDEPENDENT_AMBULATORY_CARE_PROVIDER_SITE_OTHER): Payer: Medicare Other

## 2020-02-26 ENCOUNTER — Encounter: Payer: Self-pay | Admitting: Physician Assistant

## 2020-02-26 VITALS — BP 132/86 | HR 83 | Temp 97.2°F | Ht 62.25 in | Wt 174.5 lb

## 2020-02-26 DIAGNOSIS — R319 Hematuria, unspecified: Secondary | ICD-10-CM

## 2020-02-26 DIAGNOSIS — K582 Mixed irritable bowel syndrome: Secondary | ICD-10-CM | POA: Diagnosis not present

## 2020-02-26 DIAGNOSIS — R8271 Bacteriuria: Secondary | ICD-10-CM | POA: Diagnosis not present

## 2020-02-26 LAB — URINALYSIS, ROUTINE W REFLEX MICROSCOPIC
Bilirubin Urine: NEGATIVE
Ketones, ur: NEGATIVE
Nitrite: NEGATIVE
Specific Gravity, Urine: >=1.03 — AB (ref 1.000–1.030)
Total Protein, Urine: NEGATIVE
Urine Glucose: NEGATIVE
Urobilinogen, UA: 0.2 (ref 0.0–1.0)
pH: 5.5 (ref 5.0–8.0)

## 2020-02-26 MED ORDER — NITROFURANTOIN MONOHYD MACRO 100 MG PO CAPS: 100.0000 mg | ORAL_CAPSULE | Freq: Two times a day (BID) | ORAL | 0 refills | Status: AC

## 2020-02-26 NOTE — Telephone Encounter (Signed)
Spoke with nurse at [facility] and they are having patient come into the office to provide another sample so they can do a culture on her urine. Informed the nurse we would go ahead and start treatment this weekend per [facility] and if they need to change once culture is back they can. Nurse at [facility] agreed with plan.

## 2020-02-26 NOTE — Telephone Encounter (Signed)
Per Desiree Desiree, [place] send in Macrobid 100 mg twice daily for 5 days. Patient informed. Script sent to pharmacy.

## 2020-02-26 NOTE — Patient Instructions (Signed)
If you are age 62 or older, your body mass index should be between 23-30. Your Body mass index is 31.66 kg/m. If this is out of the aforementioned range listed, please consider follow up with your Primary Care Provider.  If you are age 52 or younger, your body mass index should be between 19-25. Your Body mass index is 31.66 kg/m. If this is out of the aformentioned range listed, please consider follow up with your Primary Care Provider.   Your provider has requested that you go to the basement level for lab work before leaving today. Press "B" on the elevator. The lab is located at the first door on the left as you exit the elevator.  Continue Dicyclomine and Cholestyramine as needed.

## 2020-02-26 NOTE — Progress Notes (Signed)
Chief Complaint: Follow-up left lower quadrant abdominal pain and diarrhea  HPI:    [name] is a 64 year old female, known to Dr. Loletha Carrow, with a past medical history as listed below, who presents to clinic today for follow-up of her left lower quadrant abdominal pain and diarrhea.      12/18/2019 patient seen in clinic with left lower quadrant abdominal pain and diarrhea.At time of follow-up patient was doing better ever since drinking the contrast for the CT.  Did use the cholestyramine a few times and it helped with the diarrhea.  At that time scheduled for colon and continued on hyoscyamine as needed for pain.    12/30/2019 colonoscopy for change in bowel habits and left lower abdominal pain.  At that time patient previously been seen 12/24 with diarrhea greater than 10 stools before lunch and often up to 20-30 stools a day for few years which she gotten worse over the past 2 months.  CT the abdomen pelvis was done with contrast and was normal.  Stool studies were normal.  Patient was given cholestyramine twice daily.  Impression showed examined portion of the ileum was normal, normal mucosa in the entire examined colon, diverticulosis in the sigmoid colon otherwise normal.  Discussed that overall symptoms are consistent with IBS.  Repeat recommended in 10 years.  Biopsies were normal.     01/29/2020 patient seen in clinic and described that she was feeling better.  She is doing the half a pack of cholestyramine every other day which seemed to help her bowel movements stay more normal.  She was still sometimes have 4-6 bowel movements a day though and typically's were urgent after eating.  Also complained of left lower quadrant pain which is rated 6-7/10 and seemed to be better when she is on a completely empty stomach.  That time was noted she had mixed IBS with both diarrhea and constipation with some left lower quadrant pain thought related after normal colonoscopy with only diverticulosis and a normal  CT.  She is continued on cholestyramine half packet every other day titrated to formed bowel movements.  Stopped hyoscyamine.  Started Dicyclomine 10 mg 4 times daily.    Today, the patient tells me that she is completely better from a GI standpoint.  She has decreased her Dicyclomine to 10 mg twice a day once in the morning and once before she goes to bed and is using a half a packet of Cholestyramine every other day and is having regular bowel movements.  Tells me they are solid and she has no urgency after eating.  She is very happy with the results.  Does tell me though that she started with some blood in her urine on Wednesday of last week and also some pain with urination.  Tells me she thinks she may have a UTI.  She has been trying to treat this naturally with garlic and another herb.    Denies fever, chills, weight loss or blood in her stool.   Past Medical History:  Diagnosis Date  . Anxiety   . Breast cancer ([place])   . Complication of anesthesia    hypotension  . DDD (degenerative disc disease), lumbar   . Erosion of vaginal mesh ([place])   . Fibromyalgia   . GERD (gastroesophageal reflux disease)   . History of gastric ulcer   . History of radiation therapy 07/06/16- 08/22/16   Right Breast  . Hypertension   . Hypothyroidism   . Personal history of  radiation therapy   . Rectocele     Past Surgical History:  Procedure Laterality Date  . BLADDER TACK  2003  . BREAST BIOPSY Right 04/18/2016  . BREAST LUMPECTOMY Right 05/18/2016  . Breast mass removal Left 1985   Benign  . CARDIOVASCULAR STRESS TEST  07-02-2013  DR DALTON MCLEAN   LOW RISK NUCLEAR STUDY WITH A SMALL, MILD APICAL SEPTAL FIXED PERFUSION DEFECT .  GIVEN NORMAL WALL FUNCTION , SUSPECT REPRESENTS ATTENUATION/  EF 74%/  NO ISCHEMIA  . CATARACT EXTRACTION W/ INTRAOCULAR LENS IMPLANT Left 01/2014  . CATARACT EXTRACTION W/ INTRAOCULAR LENS IMPLANT Right 12/2017   Dr. Manuella Ghazi  . CHOLECYSTECTOMY N/A 12/10/2017   Procedure:  LAPAROSCOPIC CHOLECYSTECTOMY;  Surgeon: Rolm Bookbinder, MD;  Location: [facility];  Service: General;  Laterality: N/A;  . CYSTO/  HYDRODISTENTION/  BLADDER BX/   INSTILLATION THERAPY  08-29-2010  . EXCISION OF MESH N/A 03/23/2014   Procedure: EXCISION OF VAGINAL AND PERI-URETHRAL MESH, EXTRACTION FROM URETHRA TO APEX, insertion of XENFORM in the vaginal vault;  Surgeon: Ailene Rud, MD;  Location: [facility];  Service: Urology;  Laterality: N/A;  . EXCISION VAGINAL CYST N/A 04/06/2014   Procedure: VAGINAL SPECULUM EXAMINATION/POSSIBLE DRAINAGE OF HEMATOMA;  Surgeon: Ailene Rud, MD;  Location: [facility];  Service: Urology;  Laterality: N/A;  . INCONTINENCE SURGERY  2009  &  2005  . RADIOACTIVE SEED GUIDED PARTIAL MASTECTOMY WITH AXILLARY SENTINEL LYMPH NODE BIOPSY Right 05/18/2016   Procedure: RADIOACTIVE SEED GUIDED RIGHT BREAST LUMPECTOMY WITH AXILLARY SENTINEL LYMPH NODE BIOPSY;  Surgeon: Rolm Bookbinder, MD;  Location: [facility];  Service: General;  Laterality: Right;  RADIOACTIVE SEED GUIDED RIGHT BREAST LUMPECTOMY WITH AXILLARY SENTINEL LYMPH NODE BIOPSY  . TRANSTHORACIC ECHOCARDIOGRAM  06-16-2013   GRADE I DIASTOLIC DYSFUNCTION/  EF 55-60%/  MILD AR/  TRIVIAL MR  &  TR  . VAGINAL HYSTERECTOMY  2000   w/  unilateral salpingoophorectomy  . VEIN BYPASS SURGERY  AGE 65   RIGHT LEG    Current Outpatient Medications  Medication Sig Dispense Refill  . ALPRAZolam (XANAX) 0.5 MG tablet TAKE 1/2-1 TABLET BY MOUTH 2-3 TIMES DAILY IF NEEDED FOR ANXIETY ATTACKS AND PLEASE TRY TO LIMIT TO 5 DAYS A WEEK TO AVOID ADDICTION 90 tablet 0  . Ascorbic Acid (VITAMIN C) 1000 MG tablet Take 1,000 mg by mouth daily.    . cetirizine (ZYRTEC) 10 MG tablet Take 10 mg by mouth daily.     . Cholecalciferol (VITAMIN D-3) 5000 UNITS TABS Take 5,000 Units by mouth 2 (two) times daily.     . cholestyramine (QUESTRAN) 4 g packet Take 1 packet (4 g total)  by mouth 2 (two) times daily. 60 each 5  . dicyclomine (BENTYL) 10 MG capsule Take 1 capsule (10 mg total) by mouth 4 (four) times daily -  before meals and at bedtime. 90 capsule 5  . ezetimibe (ZETIA) 10 MG tablet Take 1 tablet Daily for Cholesterol 90 tablet 0  . famotidine (PEPCID) 40 MG tablet Take 1 tablet (40 mg total) by mouth 2 (two) times daily. (Patient taking differently: Take 40 mg by mouth as needed. ) 60 tablet 1  . fluticasone (FLONASE) 50 MCG/ACT nasal spray USE TWO SPRAY(S) IN EACH NOSTRIL ONCE DAILY AS NEEDED 48 g 3  . gabapentin (NEURONTIN) 600 MG tablet Take 1/2 to 1 tablet   2 to 3 x /day as needed to control pain 270 tablet 0  .  hyoscyamine (LEVSIN SL) 0.125 MG SL tablet DISSOLVE 1 TABLET IN MOUTH EVERY 6 HOURS AS NEEDED 60 tablet 0  . ketoconazole (NIZORAL) 2 % cream Apply Daily to Ringworm Skin  Infection 30 g 1  . levothyroxine (SYNTHROID) 100 MCG tablet Take 1 tablet (100 mcg total) by mouth daily. on empty stomach. 90 tablet 1  . Multiple Vitamins-Minerals (GERITABS PO) Take 1 tablet by mouth daily. Reported on 06/08/2016    . potassium chloride SA (KLOR-CON) 20 MEQ tablet Take 1 tablet 2 x /day for Potassium 180 tablet 3  . rosuvastatin (CRESTOR) 20 MG tablet Take 1 tablet Daily for Cholesterol 90 tablet 3  . tamoxifen (NOLVADEX) 20 MG tablet Take 1 tablet by mouth once daily 90 tablet 0  . venlafaxine XR (EFFEXOR-XR) 37.5 MG 24 hr capsule Take 1 capsule (37.5 mg total) by mouth daily with breakfast. 90 capsule 4  . zinc gluconate 50 MG tablet Take 50 mg by mouth daily.     No current facility-administered medications for this visit.    Allergies as of 02/26/2020 - Review Complete 02/16/2020  Allergen Reaction Noted  . Iodine  11/20/2019  . Lipitor [atorvastatin] Other (See Comments) 01/05/2016  . Shellfish allergy Diarrhea and Nausea And Vomiting 09/18/2012  . Betadine [povidone iodine] Rash 05/10/2016    Family History  Problem Relation Age of Onset  .  Diabetes Mother   . Diverticulitis Mother   . Lung cancer Brother   . Intestinal polyp Sister   . Diverticulitis Sister     Social History   Socioeconomic History  . Marital status: Married    Spouse name: Not on file  . Number of children: 2  . Years of education: Not on file  . Highest education level: Not on file  Occupational History  . Not on file  Tobacco Use  . Smoking status: Former Smoker    Years: 20.00    Types: Cigarettes    Quit date: 11/28/1995    Years since quitting: 24.2  . Smokeless tobacco: Current User    Types: Snuff  . Tobacco comment: dips daily  Substance and Sexual Activity  . Alcohol use: No  . Drug use: No  . Sexual activity: Not on file  Other Topics Concern  . Not on file  Social History Narrative  . Not on file   Social Determinants of Health   Financial Resource Strain:   . Difficulty of Paying Living Expenses:   Food Insecurity:   . Worried About Charity fundraiser in the Last Year:   . Arboriculturist in the Last Year:   Transportation Needs:   . Film/video editor (Medical):   Marland Kitchen Lack of Transportation (Non-Medical):   Physical Activity:   . Days of Exercise per Week:   . Minutes of Exercise per Session:   Stress:   . Feeling of Stress :   Social Connections:   . Frequency of Communication with Friends and Family:   . Frequency of Social Gatherings with Friends and Family:   . Attends Religious Services:   . Active Member of Clubs or Organizations:   . Attends Archivist Meetings:   Marland Kitchen Marital Status:   Intimate Partner Violence:   . Fear of Current or Ex-Partner:   . Emotionally Abused:   Marland Kitchen Physically Abused:   . Sexually Abused:     Review of Systems:    Constitutional: No weight loss, fever or chills Cardiovascular: No chest pain  Respiratory: No SOB  Gastrointestinal: See HPI and otherwise negative   Physical Exam:  Vital signs: BP 132/86 (BP Location: Left Arm, Patient Position: Sitting, Cuff  Size: Normal)   Pulse 83   Temp (!) 97.2 F (36.2 C)   Ht 5' 2.25" (1.581 m)   Wt 174 lb 8 oz (79.2 kg)   SpO2 99%   BMI 31.66 kg/m   Constitutional:   Pleasant Caucasian female appears to be in NAD, Well developed, Well nourished, alert and cooperative Respiratory: Respirations even and unlabored. Lungs clear to auscultation bilaterally.   No wheezes, crackles, or rhonchi.  Cardiovascular: Normal S1, S2. No MRG. Regular rate and rhythm. No peripheral edema, cyanosis or pallor.  Gastrointestinal:  Soft, nondistended, nontender. No rebound or guarding. Normal bowel sounds. No appreciable masses or hepatomegaly. Rectal:  Not performed.  Psychiatric: Demonstrates good judgement and reason without abnormal affect or behaviors.  No recent labs/imaging.  Assessment: 1.  IBS mixed: Currently under control with Dicyclomine 10 mg twice daily and Cholestyramine half packet every other day 2.  Hematuria/dysuria: Patient thinks she may have a UTI  Plan: 1.  Ordered a urinalysis with reflex.  Discussed with the patient that if this is normal then would recommend she follow with her urologist/PCP. 2.  Continue Dicyclomine 10 mg twice daily and Cholestyramine half packet every other day 3.  Patient will follow with [place] as needed in the future.  Ellouise Newer, PA-C [facility] 02/26/2020, 11:04 AM  Cc: Unk Pinto, MD

## 2020-02-26 NOTE — Telephone Encounter (Signed)
Patient called she said the Urologist can not see her until the 22nd she would like to know if she can get something to help her in the meantime.

## 2020-03-02 NOTE — Progress Notes (Signed)
____________________________________________________________  Attending physician addendum:  Thank you for sending this case to me. I have reviewed the entire note, and the outlined plan seems appropriate.  Elya Diloreto Danis, MD  ____________________________________________________________  

## 2020-03-07 ENCOUNTER — Other Ambulatory Visit: Payer: Self-pay | Admitting: Internal Medicine

## 2020-03-11 ENCOUNTER — Other Ambulatory Visit: Payer: Self-pay

## 2020-03-11 ENCOUNTER — Encounter: Payer: Self-pay | Admitting: Podiatry

## 2020-03-11 ENCOUNTER — Ambulatory Visit (INDEPENDENT_AMBULATORY_CARE_PROVIDER_SITE_OTHER): Payer: Medicare Other | Admitting: Podiatry

## 2020-03-11 VITALS — Temp 97.2°F

## 2020-03-11 DIAGNOSIS — B351 Tinea unguium: Secondary | ICD-10-CM

## 2020-03-11 DIAGNOSIS — L03031 Cellulitis of right toe: Secondary | ICD-10-CM | POA: Diagnosis not present

## 2020-03-11 DIAGNOSIS — L601 Onycholysis: Secondary | ICD-10-CM | POA: Diagnosis not present

## 2020-03-11 MED ORDER — DOXYCYCLINE HYCLATE 100 MG PO TABS: 100.0000 mg | ORAL_TABLET | Freq: Two times a day (BID) | ORAL | 0 refills | Status: DC

## 2020-03-11 NOTE — Progress Notes (Signed)
Subjective:   Patient ID: Desiree Anderson, female   DOB: 64 y.o.   MRN: [id number]   HPI Patient states she started to develop redness recently in her right big toe and states that she is concerned about infection and does not remember trauma.  Has had previous procedures done about a year and a half ago   ROS      Objective:  Physical Exam  Neurovascular status intact with irritation right hallux with redness at the base with no proximal edema erythema drainage noted with mild to moderate discomfort noted in the lateral border right hallux     Assessment:  Localized paronychia infection right hallux lateral border     Plan:  H&P reviewed condition and went ahead today and anesthetized toe 60 mg like Marcaine mixture sterile prep applied and using sterile instrumentation remove the lateral border flushed the area out and instructed on soaks.  Reappoint for [place] to recheck

## 2020-03-11 NOTE — Patient Instructions (Signed)

## 2020-03-17 DIAGNOSIS — N3000 Acute cystitis without hematuria: Secondary | ICD-10-CM | POA: Diagnosis not present

## 2020-03-22 ENCOUNTER — Other Ambulatory Visit: Payer: Self-pay | Admitting: Internal Medicine

## 2020-03-22 DIAGNOSIS — E039 Hypothyroidism, unspecified: Secondary | ICD-10-CM

## 2020-05-03 ENCOUNTER — Ambulatory Visit: Payer: Medicare Other | Admitting: Adult Health

## 2020-05-04 ENCOUNTER — Other Ambulatory Visit: Payer: Self-pay

## 2020-05-04 ENCOUNTER — Ambulatory Visit (INDEPENDENT_AMBULATORY_CARE_PROVIDER_SITE_OTHER): Payer: Medicare Other | Admitting: Adult Health Nurse Practitioner

## 2020-05-04 ENCOUNTER — Encounter: Payer: Self-pay | Admitting: Adult Health Nurse Practitioner

## 2020-05-04 VITALS — BP 126/78 | HR 85 | Temp 97.5°F | Ht 62.0 in | Wt 166.0 lb

## 2020-05-04 DIAGNOSIS — E559 Vitamin D deficiency, unspecified: Secondary | ICD-10-CM

## 2020-05-04 DIAGNOSIS — K219 Gastro-esophageal reflux disease without esophagitis: Secondary | ICD-10-CM | POA: Diagnosis not present

## 2020-05-04 DIAGNOSIS — E039 Hypothyroidism, unspecified: Secondary | ICD-10-CM | POA: Diagnosis not present

## 2020-05-04 DIAGNOSIS — F419 Anxiety disorder, unspecified: Secondary | ICD-10-CM

## 2020-05-04 DIAGNOSIS — Z79899 Other long term (current) drug therapy: Secondary | ICD-10-CM

## 2020-05-04 DIAGNOSIS — Z0001 Encounter for general adult medical examination with abnormal findings: Secondary | ICD-10-CM | POA: Diagnosis not present

## 2020-05-04 DIAGNOSIS — I10 Essential (primary) hypertension: Secondary | ICD-10-CM | POA: Diagnosis not present

## 2020-05-04 DIAGNOSIS — E782 Mixed hyperlipidemia: Secondary | ICD-10-CM | POA: Diagnosis not present

## 2020-05-04 DIAGNOSIS — K5790 Diverticulosis of intestine, part unspecified, without perforation or abscess without bleeding: Secondary | ICD-10-CM

## 2020-05-04 DIAGNOSIS — K580 Irritable bowel syndrome with diarrhea: Secondary | ICD-10-CM

## 2020-05-04 DIAGNOSIS — R6889 Other general symptoms and signs: Secondary | ICD-10-CM

## 2020-05-04 DIAGNOSIS — R7309 Other abnormal glucose: Secondary | ICD-10-CM

## 2020-05-04 DIAGNOSIS — Z170 Estrogen receptor positive status [ER+]: Secondary | ICD-10-CM

## 2020-05-04 DIAGNOSIS — J301 Allergic rhinitis due to pollen: Secondary | ICD-10-CM

## 2020-05-04 DIAGNOSIS — C50411 Malignant neoplasm of upper-outer quadrant of right female breast: Secondary | ICD-10-CM | POA: Diagnosis not present

## 2020-05-04 DIAGNOSIS — N8111 Cystocele, midline: Secondary | ICD-10-CM

## 2020-05-04 DIAGNOSIS — Z0000 Encounter for general adult medical examination without abnormal findings: Secondary | ICD-10-CM

## 2020-05-04 DIAGNOSIS — M797 Fibromyalgia: Secondary | ICD-10-CM

## 2020-05-04 DIAGNOSIS — Z6830 Body mass index (BMI) 30.0-30.9, adult: Secondary | ICD-10-CM

## 2020-05-04 DIAGNOSIS — E876 Hypokalemia: Secondary | ICD-10-CM

## 2020-05-04 MED ORDER — FAMOTIDINE 40 MG PO TABS: 40.0000 mg | ORAL_TABLET | ORAL | Status: DC | PRN

## 2020-05-04 NOTE — Patient Instructions (Addendum)
  All of your health screening are up to date.   ALLERGY MEDICATIONS OVER THE COUNTER  Please pick one of the over the counter allergy medications below and take it once daily for allergies.  Claritin or loratadine cheapest but likely the weakest  Zyrtec (certizine) at night because it can make you sleepy The strongest is allegra (fexafinadine)  Cheapest at [employer], [employer], [employer]  Rotate between these to find the most drying for you.  You may need to change them based on the season or your specific trigger.  You may use sudafed, nasal decongestant.  Please monitor your blood pressure as this can elevate your blood pressure.  If this occurs, please stop taking the medication.  Continue flonase once a day.   Monitor you left toe.  Any increase in redness, pain or fever, contact podiatry for follow up.  Continue to soak in epsom salt as directed.   We will contact you with your lab results in 1-3 days.

## 2020-05-04 NOTE — Progress Notes (Signed)
MEDICARE ANNUAL WELLNESS VISIT AND 3 MONTH FOLLOW UP  Assessment:   Desiree Anderson was seen today for follow-up and medicare wellness.  Diagnoses and all orders for this visit:  Medicare annual wellness visit, subsequent Yearly Mammogram, scheduled 05/2020  Essential hypertension No medications at this time Monitor blood pressure at home; call if consistently over 130/80 Continue DASH diet.   Reminder to go to the ER if any CP, SOB, nausea, dizziness, severe HA, changes vision/speech, left arm numbness and tingling and jaw pain. -     CBC with Differential/Platelet -     COMPLETE METABOLIC PANEL WITH GFR -     Magnesium  Hyperlipidemia, mixed Continue medications: Crestor 20mg  and zetia 10mg  Continue low cholesterol diet and exercise.  Check lipid panel.  -     Lipid panel  Vitamin D deficiency Continue supplementation -     VITAMIN D 25 Hydroxy (Vit-D Deficiency, Fractures)  Hypothyroidism, unspecified type Will check labs today, some hyper symptoms -     levothyroxine (SYNTHROID) 100 MCG tablet; Take half tablet (68mcg)  Mon & Thurs and whole tablet five days a week. Take on an empty stomach with only water for 30 minutes & no Antacid meds for 4 hours.  Gastroesophageal reflux disease, esophagitis presence not specified Doing well at this time Continue: famotidine 40mg  BID Diet discussed Monitor for triggers Avoid food with high acid content Avoid excessive cafeine Increase water intake  Fibromyalgia Syndrome Doing well at this time Management with gabapentin at night few times a week when she has flares  Cystocele, midline Follows with Dr Harrington Challenger for this  Diverticulosis Doing well IBS-D Doing well Has changed diertary intake Taking hyoscyamine 0.125mg  once a week  Hypokalemia Taking Rx supplementation -CMP  Anxiety Doing well on current regiment: Effexor-XR 37.5mg  daily and Alprazolam 0.5mg  half tablet Discussed taking only PRN and limiting to 5 days a week  to avoid addiction  Abnormal glucose Discussed dietary and exercise modifications -     Hemoglobin A1c  Medication management Continued  Seasonal allergic rhinitis Continue Flonase daily Consider changing antihistamines May use sudafed as directed, monitor blood pressure.  BMI 30.0-30.9,adult Discussed dietary and exercise modifications   Over 40 minutes of face to face exam, counseling, chart review and critical decision making was performed.  Future Appointments  Date Time Provider [place]  07/08/2020 11:00 AM [facility] 1 [facility] None  07/08/2020 11:30 AM Magrinat, Virgie Dad, MD [facility] None  08/09/2020 11:00 AM Unk Pinto, MD [facility] None  02/02/2021  2:00 PM Unk Pinto, MD [facility] None     Plan:   During the course of the visit the patient was educated and counseled about appropriate screening and preventive services including:    Pneumococcal vaccine   Prevnar 13  Influenza vaccine  Td vaccine  Screening electrocardiogram  Bone densitometry screening  Colorectal cancer screening  Diabetes screening  Glaucoma screening  Nutrition counseling   Advanced directives: requested   Subjective:  Desiree Anderson is a 64 y.o. female who presents for Medicare Annual Wellness Visit and 3 month follow up on HTN, HLD, hypothyroidism, GERD, weight and vitamin D Defciency. She has history of fibromyalgia and chronic pain from DDD/DJD.  Also has history of breast cancer, right, s/p mastectomy (04/2016) with radiation and chemo.  She is taking Tamoxifen x5 years and follows with Dr Desiree Anderson.  Four weeks ago, at Dr Jena Gauss office, had ingrown toenail.  She completed an antibiotic treatment.  She reports she has been soaking  once a day in epsom salts.  She reports it has improved but still small amount of redness at the base of her left toenail.  Has been taking Zyrtec daily.  She started taking flonase daily,  She reports some pressure  behind her head.   HTN predates 2004.  Her blood pressure has been controlled at home, today their BP is BP: 126/78 She does workout. She denies chest pain, shortness of breath, dizziness.   She is on cholesterol medication and denies myalgias. Her cholesterol is not at goal. The cholesterol last visit was:   Lab Results  Component Value Date   CHOL 125 01/26/2020   HDL 48 (L) 01/26/2020   LDLCALC 53 01/26/2020   TRIG 154 (H) 01/26/2020   CHOLHDL 2.6 01/26/2020    She has been working on diet and exercise for abnormal glucose/prediabetes, and denies hyperglycemia, hypoglycemia , nausea, polydipsia, polyuria and visual disturbances. Last A1C in the office was:  Lab Results  Component Value Date   HGBA1C 5.9 (H) 01/02/2019   Last GFR:   Lab Results  Component Value Date   GFRNONAA 96 01/02/2019   Lab Results  Component Value Date   GFRAA 111 01/02/2019   Patient is on Vitamin D supplement.  Deficiency noted in 09/2007.  Last check she was at goal. Lab Results  Component Value Date   VD25OH 43 01/02/2019     She has been on thyroid replacement since 2002. Currently taking levothyroxine 61mcg daily.  Her medication was changed last visit. Lab Results  Component Value Date   TSH 0.29 (L) 04/15/2019      Medication Review: Current Outpatient Medications on File Prior to Visit  Medication Sig Dispense Refill  . ALPRAZolam (XANAX) 0.5 MG tablet TAKE 1/2-1 TABLET BY MOUTH 2-3 TIMES DAILY IF NEEDED FOR ANXIETY ATTACKS AND PLEASE TRY TO LIMIT TO 5 DAYS A WEEK TO AVOID ADDICTION 90 tablet 0  . Ascorbic Acid (VITAMIN C) 1000 MG tablet Take 1,000 mg by mouth daily.    . cetirizine (ZYRTEC) 10 MG tablet Take 10 mg by mouth daily.     . Cholecalciferol (VITAMIN D-3) 5000 UNITS TABS Take 5,000 Units by mouth 2 (two) times daily.     . cholestyramine (QUESTRAN) 4 g packet Take 1 packet (4 g total) by mouth 2 (two) times daily. 60 each 5  . ezetimibe (ZETIA) 10 MG tablet Take 1 tablet  daily for Cholesterol 90 tablet 0  . fluticasone (FLONASE) 50 MCG/ACT nasal spray USE TWO SPRAY(S) IN EACH NOSTRIL ONCE DAILY AS NEEDED 48 g 3  . gabapentin (NEURONTIN) 600 MG tablet Take 1/2 to 1 tablet   2 to 3 x /day as needed to control pain 270 tablet 0  . hyoscyamine (LEVSIN SL) 0.125 MG SL tablet DISSOLVE 1 TABLET IN MOUTH EVERY 6 HOURS AS NEEDED 60 tablet 0  . ketoconazole (NIZORAL) 2 % cream Apply Daily to Ringworm Skin  Infection 30 g 1  . levothyroxine (SYNTHROID) 100 MCG tablet Take 1 tablet daily on an empty stomach with only water for 30 minutes & no Antacid meds, Calcium or Magnesium for 4 hours & avoid Biotin 90 tablet 0  . Multiple Vitamin (MULTIVITAMIN PO) Take 1 tablet by mouth daily. Takes [employer]    . potassium chloride SA (KLOR-CON) 20 MEQ tablet Take 1 tablet 2 x /day for Potassium 180 tablet 3  . rosuvastatin (CRESTOR) 20 MG tablet Take 1 tablet Daily for Cholesterol 90 tablet 3  .  tamoxifen (NOLVADEX) 20 MG tablet Take 1 tablet by mouth once daily 90 tablet 0  . venlafaxine XR (EFFEXOR-XR) 37.5 MG 24 hr capsule Take 1 capsule (37.5 mg total) by mouth daily with breakfast. 90 capsule 4  . zinc gluconate 50 MG tablet Take 50 mg by mouth daily.     No current facility-administered medications on file prior to visit.    Allergies  Allergen Reactions  . Iodine   . Lipitor [Atorvastatin] Other (See Comments)    Muscle and legs aches  . Shellfish Allergy Diarrhea and Nausea And Vomiting  . Betadine [Povidone Iodine] Rash    Current Problems (verified) Patient Active Problem List   Diagnosis Date Noted  . Insomnia 03/25/2018  . Situational anxiety 03/25/2018  . Encounter for Medicare annual wellness exam 02/19/2017  . Tobacco chew use 05/15/2016  . Malignant neoplasm of upper-outer quadrant of right breast in female, estrogen receptor positive ([place]) 04/21/2016  . Hypothyroidism 04/18/2016  . Obesity (BMI 30.0-34.9) 10/11/2015  . Medication management 09/24/2014   . Cystocele, midline 03/23/2014  . Essential hypertension 12/10/2013  . Mixed hyperlipidemia 12/10/2013  . Prediabetes 12/10/2013  . Vitamin D deficiency 12/10/2013  . DJD  12/10/2013  . DDD, lumbar 12/10/2013  . Fibromyalgia Syndrome 12/10/2013    Screening Tests Immunization History  Administered Date(s) Administered  . Influenza Inj Mdck Quad With Preservative 09/04/2017, 09/30/2018  . Influenza Split 09/18/2013, 09/25/2014, 10/21/2019  . Influenza, Seasonal, Injecte, Preservative Fre 10/11/2015  . Influenza,inj,quad, With Preservative 08/28/2016, 08/27/2017  . Influenza-Unspecified 08/27/2016  . PFIZER SARS-COV-2 Vaccination 02/20/2020, 03/12/2020  . Pneumococcal-Unspecified 11/27/2000  . Tdap 03/16/2011    Preventative care: Last colonoscopy:over 13 years ago,  02/2017 Cologuard, 2/21 next 10years Last mammogram: 05/2019, schedule for 2021 Last pap smear/pelvic exam: 03/2016  - Dr Harrington Challenger DEXA: At age age 72years  Prior vaccinations: TD or Tdap: 2012  Influenza: 2019 Pneumococcal: 2002 Prevnar13: Due at age 24 Shingles/Zostavax: DUE, discussed Shingrix SARS-COV2-Pfizer complete 02/2020  Names of Other Physician/Practitioners you currently use: 1. [facility] here for primary care 2. Eye Exam Dr Manuella Ghazi 02/2018 3. Dental Exam Dr Minerva Fester, 5 years ago, DUE  Patient Care Team: Unk Pinto, MD as PCP - General (Internal Medicine) Fay Records, MD as Consulting Physician (Cardiology) [name], MD as Consulting Physician (Orthopedic Surgery) Carolan Clines, MD (Inactive) as Consulting Physician (Urology) Vanessa Kick, MD as Consulting Physician (Obstetrics and Gynecology) Magrinat, Virgie Dad, MD as Consulting Physician (Oncology) Rolm Bookbinder, MD as Consulting Physician (General Surgery) Thea Silversmith, MD as Consulting Physician (Radiation Oncology)  SURGICAL HISTORY She  has a past surgical history that includes  Incontinence surgery (2009  &  2005); BLADDER TACK (2003); Vein bypass surgery (AGE 37); transthoracic echocardiogram (06-16-2013); Cardiovascular stress test (07-02-2013  [facility]); CYSTO/  HYDRODISTENTION/  BLADDER BX/   INSTILLATION THERAPY (08-29-2010); Vaginal hysterectomy (2000); Cataract extraction w/ intraocular lens implant (Left, 01/2014); Excision of mesh (N/A, 03/23/2014); Excision vaginal cyst (N/A, 04/06/2014); Radioactive seed guided mastectomy with axillary sentinel lymph node biopsy (Right, 05/18/2016); Breast lumpectomy (Right, 05/18/2016); Breast biopsy (Right, 04/18/2016); Cholecystectomy (N/A, 12/10/2017); Cataract extraction w/ intraocular lens implant (Right, 12/2017); and Breast mass removal (Left, 1985). FAMILY HISTORY Her family history includes Diabetes in her mother; Diverticulitis in her mother and sister; Intestinal polyp in her sister; Lung cancer in her brother. SOCIAL HISTORY She  reports that she quit smoking about 24 years ago. Her smoking use included cigarettes. She quit after 20.00  years of use. Her smokeless tobacco use includes snuff. She reports that she does not drink alcohol or use drugs.   MEDICARE WELLNESS OBJECTIVES: Physical activity: Current Exercise Habits: Home exercise routine, Type of exercise: Other - see comments(house work), Time (Minutes): 30, Frequency (Times/Week): 5, Weekly Exercise (Minutes/Week): 150, Intensity: Mild, Exercise limited by: neurologic condition(s) Cardiac risk factors: Cardiac Risk Factors include: dyslipidemia;obesity (BMI >30kg/m2);sedentary lifestyle Depression/mood screen:   Depression screen [facility] 2/9 05/04/2020  Decreased Interest 0  Down, Depressed, Hopeless 0  PHQ - 2 Score 0    ADLs:  In your present state of health, do you have any difficulty performing the following activities: 05/04/2020 01/25/2020  Hearing? N N  Vision? N N  Difficulty concentrating or making decisions? N N  Walking or climbing stairs? N N   Dressing or bathing? N N  Doing errands, shopping? N N  Preparing Food and eating ? N -  Using the Toilet? N -  In the past six months, have you accidently leaked urine? N -  Do you have problems with loss of bowel control? N -  Managing your Medications? N -  Managing your Finances? N -  Some recent data might be hidden     Cognitive Testing  Alert? Yes  Normal Appearance?Yes  Oriented to person? Yes  Place? Yes   Time? Yes  Recall of three objects?  Yes  Can perform simple calculations? Yes  Displays appropriate judgment?Yes  Can read the correct time from a watch face?Yes  EOL planning: Does Patient Have a Medical Advance Directive?: Yes Type of Advance Directive: Healthcare Power of Attorney, Living will Copy of [place] in Chart?: No - copy requested     Objective:     Today's Vitals   05/04/20 1100  BP: 126/78  Pulse: 85  Temp: (!) 97.5 F (36.4 C)  SpO2: 99%  Weight: 166 lb (75.3 kg)  Height: 5\' 2"  (1.575 m)  PainSc: 4   PainLoc: Back   Body mass index is 30.36 kg/m.  General appearance: alert, no distress, WD/WN, female HEENT: normocephalic, sclerae anicteric, TMs pearly, nares patent, no discharge or erythema, pharynx normal Oral cavity: MMM, no lesions Neck: supple, no lymphadenopathy, no thyromegaly, no masses Heart: RRR, normal S1, S2, no murmurs Lungs: CTA bilaterally, no wheezes, rhonchi, or rales Abdomen: +bs, soft, non tender, non distended, no masses, no hepatomegaly, no splenomegaly Musculoskeletal: nontender, no swelling, no obvious deformity Extremities: no edema, no cyanosis, no clubbing Pulses: 2+ symmetric, upper and lower extremities, normal cap refill Neurological: alert, oriented x 3, CN2-12 intact, strength normal upper extremities and lower extremities, sensation normal throughout, DTRs 2+ throughout, no cerebellar signs, gait normal Psychiatric: normal affect, behavior normal, pleasant   Medicare  Attestation I have personally reviewed: [name] medical and social history Their use of alcohol, tobacco or illicit drugs Their current medications and supplements [name] functional ability including ADLs,fall risks, home safety risks, cognitive, and hearing and visual impairment Diet and physical activities Evidence for depression or mood disorders  [name] weight, height, BMI, and visual acuity have been recorded in the chart.  I have made referrals, counseling, and provided education to the patient based on review of the above and I have provided the patient with a written personalized care plan for preventive services.     Garnet Sierras, NP [facility] 04/15/2019  11:43 AM

## 2020-05-05 LAB — COMPLETE METABOLIC PANEL WITH GFR
AG Ratio: 2 (calc) (ref 1.0–2.5)
ALT: 31 U/L — ABNORMAL HIGH (ref 6–29)
AST: 33 U/L (ref 10–35)
Albumin: 4.3 g/dL (ref 3.6–5.1)
Alkaline phosphatase (APISO): 53 U/L (ref 37–153)
BUN: 16 mg/dL (ref 7–25)
CO2: 26 mmol/L (ref 20–32)
Calcium: 9.2 mg/dL (ref 8.6–10.4)
Chloride: 105 mmol/L (ref 98–110)
Creat: 0.65 mg/dL (ref 0.50–0.99)
GFR, Est African American: 110 mL/min/{1.73_m2} (ref >=60)
GFR, Est Non African American: 95 mL/min/{1.73_m2} (ref >=60)
Globulin: 2.1 g/dL (calc) (ref 1.9–3.7)
Glucose, Bld: 87 mg/dL (ref 65–99)
Potassium: 4.6 mmol/L (ref 3.5–5.3)
Sodium: 143 mmol/L (ref 135–146)
Total Bilirubin: 0.5 mg/dL (ref 0.2–1.2)
Total Protein: 6.4 g/dL (ref 6.1–8.1)

## 2020-05-05 LAB — CBC WITH DIFFERENTIAL/PLATELET
Absolute Monocytes: 544 cells/uL (ref 200–950)
Basophils Absolute: 51 cells/uL (ref 0–200)
Basophils Relative: 0.6 %
Eosinophils Absolute: 111 cells/uL (ref 15–500)
Eosinophils Relative: 1.3 %
HCT: 41 % (ref 35.0–45.0)
Hemoglobin: 13.3 g/dL (ref 11.7–15.5)
Lymphs Abs: 3570 cells/uL (ref 850–3900)
MCH: 27.9 pg (ref 27.0–33.0)
MCHC: 32.4 g/dL (ref 32.0–36.0)
MCV: 86 fL (ref 80.0–100.0)
MPV: 10.7 fL (ref 7.5–12.5)
Monocytes Relative: 6.4 %
Neutro Abs: 4225 cells/uL (ref 1500–7800)
Neutrophils Relative %: 49.7 %
Platelets: 272 10*3/uL (ref 140–400)
RBC: 4.77 10*6/uL (ref 3.80–5.10)
RDW: 13 % (ref 11.0–15.0)
Total Lymphocyte: 42 %
WBC: 8.5 10*3/uL (ref 3.8–10.8)

## 2020-05-05 LAB — LIPID PANEL
Cholesterol: 112 mg/dL (ref <200)
HDL: 50 mg/dL (ref >=50)
LDL Cholesterol (Calc): 45 mg/dL (calc)
Non-HDL Cholesterol (Calc): 62 mg/dL (calc) (ref <130)
Total CHOL/HDL Ratio: 2.2 (calc) (ref <5.0)
Triglycerides: 91 mg/dL (ref <150)

## 2020-05-05 LAB — TSH: TSH: 0.21 mIU/L — ABNORMAL LOW (ref 0.40–4.50)

## 2020-05-06 NOTE — Progress Notes (Signed)
Please contact patient with lab results  -Blood count is in normal range  -Electrolytes, kidney and liver function are in normal range.  -Your cholesterol looks good.  -Your thyroid, TSH, is low.  Change the way you take levothyroxine.  Take half tablet four days a week and whole tablet two days a week.   Let us know if you have any questions or concerns.   Sincerely,        Garnet Sierras, NP

## 2020-05-18 ENCOUNTER — Other Ambulatory Visit: Payer: Self-pay

## 2020-05-18 MED ORDER — EZETIMIBE 10 MG PO TABS: ORAL_TABLET | ORAL | 1 refills | Status: DC

## 2020-06-01 ENCOUNTER — Other Ambulatory Visit: Payer: Self-pay | Admitting: Obstetrics and Gynecology

## 2020-06-01 DIAGNOSIS — Z853 Personal history of malignant neoplasm of breast: Secondary | ICD-10-CM

## 2020-06-14 ENCOUNTER — Other Ambulatory Visit: Payer: Self-pay | Admitting: Oncology

## 2020-06-15 ENCOUNTER — Telehealth: Payer: Self-pay | Admitting: Oncology

## 2020-06-15 NOTE — Telephone Encounter (Signed)
Rescheduled patient per 7/19 message. Left message for patient on voicemail about rescheduled appointment date and time.

## 2020-06-21 ENCOUNTER — Other Ambulatory Visit: Payer: Self-pay | Admitting: Oncology

## 2020-06-21 DIAGNOSIS — Z853 Personal history of malignant neoplasm of breast: Secondary | ICD-10-CM

## 2020-06-23 ENCOUNTER — Ambulatory Visit
Admission: RE | Admit: 2020-06-23 | Discharge: 2020-06-23 | Disposition: A | Discharge status: Home / Self Care | Payer: Medicare Other | Source: Ambulatory Visit | Attending: Obstetrics and Gynecology | Admitting: Obstetrics and Gynecology

## 2020-06-23 DIAGNOSIS — Z853 Personal history of malignant neoplasm of breast: Secondary | ICD-10-CM

## 2020-06-23 DIAGNOSIS — R928 Other abnormal and inconclusive findings on diagnostic imaging of breast: Secondary | ICD-10-CM | POA: Diagnosis not present

## 2020-07-07 ENCOUNTER — Other Ambulatory Visit: Payer: Self-pay

## 2020-07-07 DIAGNOSIS — Z170 Estrogen receptor positive status [ER+]: Secondary | ICD-10-CM

## 2020-07-08 ENCOUNTER — Inpatient Hospital Stay: Payer: Medicare Other | Attending: Adult Health

## 2020-07-08 ENCOUNTER — Other Ambulatory Visit: Payer: Self-pay

## 2020-07-08 ENCOUNTER — Inpatient Hospital Stay: Payer: Medicare Other | Admitting: Adult Health

## 2020-07-08 ENCOUNTER — Other Ambulatory Visit: Payer: Medicare Other

## 2020-07-08 ENCOUNTER — Ambulatory Visit: Payer: Medicare Other | Admitting: Oncology

## 2020-07-08 ENCOUNTER — Ambulatory Visit (HOSPITAL_COMMUNITY)
Admission: RE | Admit: 2020-07-08 | Discharge: 2020-07-08 | Disposition: A | Discharge status: Home / Self Care | Payer: Medicare Other | Source: Ambulatory Visit | Attending: Adult Health | Admitting: Adult Health

## 2020-07-08 VITALS — BP 174/84 | HR 68 | Temp 97.1°F | Resp 16 | Ht 62.0 in | Wt 164.3 lb

## 2020-07-08 DIAGNOSIS — R232 Flushing: Secondary | ICD-10-CM | POA: Diagnosis not present

## 2020-07-08 DIAGNOSIS — I10 Essential (primary) hypertension: Secondary | ICD-10-CM | POA: Diagnosis not present

## 2020-07-08 DIAGNOSIS — Z888 Allergy status to other drugs, medicaments and biological substances status: Secondary | ICD-10-CM | POA: Diagnosis not present

## 2020-07-08 DIAGNOSIS — Z841 Family history of disorders of kidney and ureter: Secondary | ICD-10-CM | POA: Diagnosis not present

## 2020-07-08 DIAGNOSIS — F419 Anxiety disorder, unspecified: Secondary | ICD-10-CM | POA: Insufficient documentation

## 2020-07-08 DIAGNOSIS — Z8249 Family history of ischemic heart disease and other diseases of the circulatory system: Secondary | ICD-10-CM | POA: Insufficient documentation

## 2020-07-08 DIAGNOSIS — Z8379 Family history of other diseases of the digestive system: Secondary | ICD-10-CM | POA: Diagnosis not present

## 2020-07-08 DIAGNOSIS — Z801 Family history of malignant neoplasm of trachea, bronchus and lung: Secondary | ICD-10-CM | POA: Diagnosis not present

## 2020-07-08 DIAGNOSIS — M797 Fibromyalgia: Secondary | ICD-10-CM | POA: Insufficient documentation

## 2020-07-08 DIAGNOSIS — Z79810 Long term (current) use of selective estrogen receptor modulators (SERMs): Secondary | ICD-10-CM | POA: Diagnosis not present

## 2020-07-08 DIAGNOSIS — Z923 Personal history of irradiation: Secondary | ICD-10-CM | POA: Diagnosis not present

## 2020-07-08 DIAGNOSIS — C50411 Malignant neoplasm of upper-outer quadrant of right female breast: Secondary | ICD-10-CM | POA: Insufficient documentation

## 2020-07-08 DIAGNOSIS — E039 Hypothyroidism, unspecified: Secondary | ICD-10-CM | POA: Insufficient documentation

## 2020-07-08 DIAGNOSIS — Z87891 Personal history of nicotine dependence: Secondary | ICD-10-CM | POA: Insufficient documentation

## 2020-07-08 DIAGNOSIS — Z170 Estrogen receptor positive status [ER+]: Secondary | ICD-10-CM | POA: Insufficient documentation

## 2020-07-08 DIAGNOSIS — S4992XA Unspecified injury of left shoulder and upper arm, initial encounter: Secondary | ICD-10-CM | POA: Diagnosis not present

## 2020-07-08 DIAGNOSIS — Z8711 Personal history of peptic ulcer disease: Secondary | ICD-10-CM | POA: Diagnosis not present

## 2020-07-08 DIAGNOSIS — Z8371 Family history of colonic polyps: Secondary | ICD-10-CM | POA: Diagnosis not present

## 2020-07-08 DIAGNOSIS — M25512 Pain in left shoulder: Secondary | ICD-10-CM | POA: Diagnosis not present

## 2020-07-08 DIAGNOSIS — Z9049 Acquired absence of other specified parts of digestive tract: Secondary | ICD-10-CM | POA: Diagnosis not present

## 2020-07-08 DIAGNOSIS — Z833 Family history of diabetes mellitus: Secondary | ICD-10-CM | POA: Diagnosis not present

## 2020-07-08 DIAGNOSIS — Z79899 Other long term (current) drug therapy: Secondary | ICD-10-CM | POA: Insufficient documentation

## 2020-07-08 DIAGNOSIS — Z90721 Acquired absence of ovaries, unilateral: Secondary | ICD-10-CM | POA: Insufficient documentation

## 2020-07-08 LAB — CBC WITH DIFFERENTIAL (CANCER CENTER ONLY)
Abs Immature Granulocytes: 0.03 10*3/uL (ref 0.00–0.07)
Basophils Absolute: 0 10*3/uL (ref 0.0–0.1)
Basophils Relative: 0 %
Eosinophils Absolute: 0.1 10*3/uL (ref 0.0–0.5)
Eosinophils Relative: 1 %
HCT: 41.6 % (ref 36.0–46.0)
Hemoglobin: 13 g/dL (ref 12.0–15.0)
Immature Granulocytes: 0 %
Lymphocytes Relative: 44 %
Lymphs Abs: 4.2 10*3/uL — ABNORMAL HIGH (ref 0.7–4.0)
MCH: 27.5 pg (ref 26.0–34.0)
MCHC: 31.3 g/dL (ref 30.0–36.0)
MCV: 87.9 fL (ref 80.0–100.0)
Monocytes Absolute: 0.5 10*3/uL (ref 0.1–1.0)
Monocytes Relative: 6 %
Neutro Abs: 4.6 10*3/uL (ref 1.7–7.7)
Neutrophils Relative %: 49 %
Platelet Count: 245 10*3/uL (ref 150–400)
RBC: 4.73 MIL/uL (ref 3.87–5.11)
RDW: 13.3 % (ref 11.5–15.5)
WBC Count: 9.5 10*3/uL (ref 4.0–10.5)
nRBC: 0 % (ref 0.0–0.2)

## 2020-07-08 LAB — CMP (CANCER CENTER ONLY)
ALT: 26 U/L (ref 0–44)
AST: 30 U/L (ref 15–41)
Albumin: 3.7 g/dL (ref 3.5–5.0)
Alkaline Phosphatase: 56 U/L (ref 38–126)
Anion gap: 7 (ref 5–15)
BUN: 10 mg/dL (ref 8–23)
CO2: 28 mmol/L (ref 22–32)
Calcium: 8.4 mg/dL — ABNORMAL LOW (ref 8.9–10.3)
Chloride: 103 mmol/L (ref 98–111)
Creatinine: 0.66 mg/dL (ref 0.44–1.00)
GFR, Est AFR Am: >60 mL/min (ref >60)
GFR, Estimated: >60 mL/min (ref >60)
Glucose, Bld: 128 mg/dL — ABNORMAL HIGH (ref 70–99)
Potassium: 4.2 mmol/L (ref 3.5–5.1)
Sodium: 138 mmol/L (ref 135–145)
Total Bilirubin: 0.3 mg/dL (ref 0.3–1.2)
Total Protein: 6.5 g/dL (ref 6.5–8.1)

## 2020-07-08 NOTE — Progress Notes (Signed)
[place]  [phone] [phone]     ID: Desiree Anderson DOB: [birth year]  MR#: [id number]  RAQ#:[id number]  Patient Care Team: Unk Pinto, MD as PCP - General (Internal Medicine) Fay Records, MD as Consulting Physician (Cardiology) [name], MD as Consulting Physician (Orthopedic Surgery) Carolan Clines, MD (Inactive) as Consulting Physician (Urology) Vanessa Kick, MD as Consulting Physician (Obstetrics and Gynecology) Magrinat, Virgie Dad, MD as Consulting Physician (Oncology) Rolm Bookbinder, MD as Consulting Physician (General Surgery) Thea Silversmith, MD as Consulting Physician (Radiation Oncology) PCP: Unk Pinto, MD GYN: OTHER MD:  CHIEF COMPLAINT: Estrogen receptor positive invasive breast cancer  CURRENT TREATMENT: Tamoxifen   BREAST CANCER HISTORY: From the original intake note:  Desiree Anderson had routine screening mammography showing a possible area of distortion in the right breast. On 04/18/2016 she had diagnostic right mammography with tomography and right breast ultrasonography at the [facility]. Breast density was category C. In the upper outer quadrant of the right breast there was a persistent area of architectural distortion which was not palpable on exam. Ultrasonography confirmed a 1.0 cm irregular mass at the 11:00 position 3 cm from the nipple. There was no right axillary lymphadenopathy by ultrasonography.  Biopsy of the right breast mass in question was obtained the same day and showed (S [id number] [phone]) an invasive ductal carcinoma, grade 2, estrogen and progesterone receptor both 100% positive with strong staining intensity, with an MIB-1 of 5%, and no HER-2 amplification, the signals ratio being 1.57, the number per cell 3.85.  Her subsequent history is as detailed below   INTERVAL HISTORY: Desiree Anderson returns today for follow-up and treatment of her estrogen positive bresat cancer.  She continues on  tamoxifen. She tolerates this well.  She was having hot flashes, however these improved with taking Effexor.    Since her last visit here, she underwent a digital diagnostic bilateral mammogram with tomography on 05/28/2019 showing: Breast Density Category B. There is no evidence of malignancy in either breast.     REVIEW OF SYSTEMS:  Desiree Anderson has noted some left shoulder pain over the past couple of weeks that has interfered with her ROM.  She has tried tylenol and advil with little relief.  She has even tried Gabapentin.  She is hoping it will improve.    Desiree Anderson denies any fever, chills, chest pain, palpitations, cough, shortness of breath, bowel/bladder changes, headaches, nausea, vomiting, or any other concerns.  A detailed ROS was otherwise non contributory.     PAST MEDICAL HISTORY: Past Medical History:  Diagnosis Date  . Anxiety   . Breast cancer ([place])   . Complication of anesthesia    hypotension  . DDD (degenerative disc disease), lumbar   . Erosion of vaginal mesh ([place])   . Fibromyalgia   . GERD (gastroesophageal reflux disease)   . History of gastric ulcer   . History of radiation therapy 07/06/16- 08/22/16   Right Breast  . Hypertension   . Hypothyroidism   . Personal history of radiation therapy   . Rectocele     PAST SURGICAL HISTORY: Past Surgical History:  Procedure Laterality Date  . BLADDER TACK  2003  . BREAST BIOPSY Right 04/18/2016  . BREAST LUMPECTOMY Right 05/18/2016  . Breast mass removal Left 1985   Benign  . CARDIOVASCULAR STRESS TEST  07-02-2013  DR DALTON MCLEAN   LOW RISK NUCLEAR STUDY WITH A SMALL, MILD APICAL SEPTAL FIXED PERFUSION DEFECT .  GIVEN NORMAL WALL FUNCTION , SUSPECT  REPRESENTS ATTENUATION/  EF 74%/  NO ISCHEMIA  . CATARACT EXTRACTION W/ INTRAOCULAR LENS IMPLANT Left 01/2014  . CATARACT EXTRACTION W/ INTRAOCULAR LENS IMPLANT Right 12/2017   Dr. Manuella Ghazi  . CHOLECYSTECTOMY N/A 12/10/2017   Procedure: LAPAROSCOPIC CHOLECYSTECTOMY;   Surgeon: Rolm Bookbinder, MD;  Location: [facility];  Service: General;  Laterality: N/A;  . CYSTO/  HYDRODISTENTION/  BLADDER BX/   INSTILLATION THERAPY  08-29-2010  . EXCISION OF MESH N/A 03/23/2014   Procedure: EXCISION OF VAGINAL AND PERI-URETHRAL MESH, EXTRACTION FROM URETHRA TO APEX, insertion of XENFORM in the vaginal vault;  Surgeon: Ailene Rud, MD;  Location: [facility];  Service: Urology;  Laterality: N/A;  . EXCISION VAGINAL CYST N/A 04/06/2014   Procedure: VAGINAL SPECULUM EXAMINATION/POSSIBLE DRAINAGE OF HEMATOMA;  Surgeon: Ailene Rud, MD;  Location: [facility];  Service: Urology;  Laterality: N/A;  . INCONTINENCE SURGERY  2009  &  2005  . RADIOACTIVE SEED GUIDED PARTIAL MASTECTOMY WITH AXILLARY SENTINEL LYMPH NODE BIOPSY Right 05/18/2016   Procedure: RADIOACTIVE SEED GUIDED RIGHT BREAST LUMPECTOMY WITH AXILLARY SENTINEL LYMPH NODE BIOPSY;  Surgeon: Rolm Bookbinder, MD;  Location: [facility];  Service: General;  Laterality: Right;  RADIOACTIVE SEED GUIDED RIGHT BREAST LUMPECTOMY WITH AXILLARY SENTINEL LYMPH NODE BIOPSY  . TRANSTHORACIC ECHOCARDIOGRAM  06-16-2013   GRADE I DIASTOLIC DYSFUNCTION/  EF 55-60%/  MILD AR/  TRIVIAL MR  &  TR  . VAGINAL HYSTERECTOMY  2000   w/  unilateral salpingoophorectomy  . VEIN BYPASS SURGERY  AGE 47   RIGHT LEG    FAMILY HISTORY Family History  Problem Relation Age of Onset  . Diabetes Mother   . Diverticulitis Mother   . Lung cancer Brother   . Intestinal polyp Sister   . Diverticulitis Sister   [name] father died at the age of 89 from a myocardial infarction. He was not a smoker however. [name] mother died with renal failure at the age of 35. The patient had 4 brothers, 3 sisters. The only cancer in the family was one brother diagnosed with lung cancer at the age of 47. There is no history of breast or ovarian cancer in the family.   GYNECOLOGIC HISTORY:  No LMP  recorded. Patient has had an ablation. Menarche age 64, first live birth age 38, the patient is Desiree Anderson. She stopped having periods approximately 2003. She took hormone replacement approximately 3 years. She also used birth control pills remotely for approximately 20 years, with no complications.   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 Her husband Mortimer Fries ran the family business, produce and plants, in [employer], for 17 years. He is now retired. Daughter Dentist lives in [place] and is a homemaker. Son Carlynn Spry Goins lives in [place] and has multiple health problems. The patient has 2 granddaughters. She also has 2 grandchildren who have died. She is a Furniture conservator/restorer.    ADVANCED DIRECTIVES: Not in place   HEALTH MAINTENANCE: Social History   Tobacco Use  . Smoking status: Former Smoker    Years: 20.00    Types: Cigarettes    Quit date: 11/28/1995    Years since quitting: 24.6  . Smokeless tobacco: Current User    Types: Snuff  . Tobacco comment: dips daily  Vaping Use  . Vaping Use: Never used  Substance Use  Topics  . Alcohol use: No  . Drug use: No     Colonoscopy: 2007/ [facility]  PAP:  Bone density: 2012/ [facility]  Lipid panel:  Allergies  Allergen Reactions  . Iodine   . Lipitor [Atorvastatin] Other (See Comments)    Muscle and legs aches  . Shellfish Allergy Diarrhea and Nausea And Vomiting  . Betadine [Povidone Iodine] Rash    Current Outpatient Medications  Medication Sig Dispense Refill  . ALPRAZolam (XANAX) 0.5 MG tablet TAKE 1/2-1 TABLET BY MOUTH 2-3 TIMES DAILY IF NEEDED FOR ANXIETY ATTACKS AND PLEASE TRY TO LIMIT TO 5 DAYS A WEEK TO AVOID ADDICTION 90 tablet 0  . Ascorbic Acid (VITAMIN C) 1000 MG tablet Take 1,000 mg by mouth daily.    . cetirizine (ZYRTEC) 10 MG tablet Take 10 mg by mouth daily.     . Cholecalciferol (VITAMIN D-3) 5000 UNITS TABS Take 5,000  Units by mouth 2 (two) times daily.     . cholestyramine (QUESTRAN) 4 g packet Take 1 packet (4 g total) by mouth 2 (two) times daily. 60 each 5  . ezetimibe (ZETIA) 10 MG tablet Take 1 tablet daily for Cholesterol 90 tablet 1  . famotidine (PEPCID) 40 MG tablet Take 1 tablet (40 mg total) by mouth as needed.    . fluticasone (FLONASE) 50 MCG/ACT nasal spray USE TWO SPRAY(S) IN EACH NOSTRIL ONCE DAILY AS NEEDED 48 g 3  . gabapentin (NEURONTIN) 600 MG tablet Take 1/2 to 1 tablet   2 to 3 x /day as needed to control pain 270 tablet 0  . hyoscyamine (LEVSIN SL) 0.125 MG SL tablet DISSOLVE 1 TABLET IN MOUTH EVERY 6 HOURS AS NEEDED 60 tablet 0  . ketoconazole (NIZORAL) 2 % cream Apply Daily to Ringworm Skin  Infection 30 g 1  . levothyroxine (SYNTHROID) 100 MCG tablet Take 1 tablet daily on an empty stomach with only water for 30 minutes & no Antacid meds, Calcium or Magnesium for 4 hours & avoid Biotin 90 tablet 0  . Multiple Vitamin (MULTIVITAMIN PO) Take 1 tablet by mouth daily. Takes [employer]    . potassium chloride SA (KLOR-CON) 20 MEQ tablet Take 1 tablet 2 x /day for Potassium 180 tablet 3  . rosuvastatin (CRESTOR) 20 MG tablet Take 1 tablet Daily for Cholesterol 90 tablet 3  . tamoxifen (NOLVADEX) 20 MG tablet Take 1 tablet by mouth once daily 90 tablet 0  . venlafaxine XR (EFFEXOR-XR) 37.5 MG 24 hr capsule Take 1 capsule (37.5 mg total) by mouth daily with breakfast. 90 capsule 4  . zinc gluconate 50 MG tablet Take 50 mg by mouth daily.     No current facility-administered medications for this visit.    OBJECTIVE: Middle aged white woman who appears stated age  66:   07/08/20 1230  BP: (!) 174/84  Pulse: 68  Resp: 16  Temp: (!) 97.1 F (36.2 C)  SpO2: 99%   Body mass index is 30.05 kg/m. ECOG FS:1 - Symptomatic but completely ambulatory GENERAL: Patient is a well appearing female in no acute distress HEENT:  Sclerae anicteric.  Oropharynx clear and moist. No ulcerations or  evidence of oropharyngeal candidiasis. Neck is supple.  NODES:  No cervical, supraclavicular, or axillary lymphadenopathy palpated.  BREAST EXAM:  Right breast s/p lumpectomy, no sign of local recurrence, left breast benign LUNGS:  Clear to auscultation bilaterally.  No wheezes or rhonchi. HEART:  Regular rate and rhythm. No murmur appreciated. ABDOMEN:  Soft, nontender.  Positive, normoactive bowel sounds. No organomegaly palpated. MSK:  No focal spinal tenderness to palpation. Full range of motion bilaterally in the upper extremities. EXTREMITIES:  No peripheral edema.   SKIN:  Clear with no obvious rashes or skin changes. No nail dyscrasia. NEURO:  Nonfocal. Well oriented.  Appropriate affect.   LAB RESULTS:  CMP     Component Value Date/Time   NA 138 07/08/2020 1126   NA 144 07/03/2017 1241   K 4.2 07/08/2020 1126   K 3.8 07/03/2017 1241   CL 103 07/08/2020 1126   CO2 28 07/08/2020 1126   CO2 27 07/03/2017 1241   GLUCOSE 128 (H) 07/08/2020 1126   GLUCOSE 142 (H) 07/03/2017 1241   BUN 10 07/08/2020 1126   BUN 11.2 07/03/2017 1241   CREATININE 0.66 07/08/2020 1126   CREATININE 0.65 05/04/2020 1213   CREATININE 0.9 07/03/2017 1241   CALCIUM 8.4 (L) 07/08/2020 1126   CALCIUM 9.2 07/03/2017 1241   PROT 6.5 07/08/2020 1126   PROT 6.7 07/03/2017 1241   ALBUMIN 3.7 07/08/2020 1126   ALBUMIN 3.7 07/03/2017 1241   AST 30 07/08/2020 1126   AST 22 07/03/2017 1241   ALT 26 07/08/2020 1126   ALT 37 07/03/2017 1241   ALKPHOS 56 07/08/2020 1126   ALKPHOS 55 07/03/2017 1241   BILITOT 0.3 07/08/2020 1126   BILITOT 0.29 07/03/2017 1241   GFRNONAA >60 07/08/2020 1126   GFRNONAA 95 05/04/2020 1213   GFRAA >60 07/08/2020 1126   GFRAA 110 05/04/2020 1213    INo results found for: SPEP, UPEP  Lab Results  Component Value Date   WBC 9.5 07/08/2020   NEUTROABS 4.6 07/08/2020   HGB 13.0 07/08/2020   HCT 41.6 07/08/2020   MCV 87.9 07/08/2020   PLT 245 07/08/2020      Chemistry       Component Value Date/Time   NA 138 07/08/2020 1126   NA 144 07/03/2017 1241   K 4.2 07/08/2020 1126   K 3.8 07/03/2017 1241   CL 103 07/08/2020 1126   CO2 28 07/08/2020 1126   CO2 27 07/03/2017 1241   BUN 10 07/08/2020 1126   BUN 11.2 07/03/2017 1241   CREATININE 0.66 07/08/2020 1126   CREATININE 0.65 05/04/2020 1213   CREATININE 0.9 07/03/2017 1241      Component Value Date/Time   CALCIUM 8.4 (L) 07/08/2020 1126   CALCIUM 9.2 07/03/2017 1241   ALKPHOS 56 07/08/2020 1126   ALKPHOS 55 07/03/2017 1241   AST 30 07/08/2020 1126   AST 22 07/03/2017 1241   ALT 26 07/08/2020 1126   ALT 37 07/03/2017 1241   BILITOT 0.3 07/08/2020 1126   BILITOT 0.29 07/03/2017 1241       No results found for: LABCA2  No components found for: LABCA125  No results for input(s): INR in the last 168 hours.  Urinalysis    Component Value Date/Time   COLORURINE YELLOW 02/26/2020 1128   APPEARANCEUR SL CLOUDY (A) 02/26/2020 1128   LABSPEC >=1.030 (A) 02/26/2020 1128   PHURINE 5.5 02/26/2020 1128   GLUCOSEU NEGATIVE 02/26/2020 1128   HGBUR MODERATE (A) 02/26/2020 1128   BILIRUBINUR NEGATIVE 02/26/2020 1128   KETONESUR NEGATIVE 02/26/2020 1128   PROTEINUR NEGATIVE 01/26/2020 1524   UROBILINOGEN 0.2 02/26/2020 1128   NITRITE NEGATIVE 02/26/2020 1128   LEUKOCYTESUR MODERATE (A) 02/26/2020 1128      ELIGIBLE FOR AVAILABLE RESEARCH PROTOCOL: no   STUDIES: Mammography results discussed with the patient  ASSESSMENT: 64 y.o. [employer]  woman status post right breast upper outer quadrant biopsy 04/18/2016 for a clinical T1b N0, stage IA invasive ductal carcinoma, grade 2, estrogen and progesterone receptor strongly positive, HER-2 nonamplified, with an MIB-1 of 5%.   (1) Status post right lumpectomy and sentinel lymph node sampling 05/18/2016 for a pT1c pN0, stage IA invasive ductal carcinoma, grade 1, with negative margins, Repeat HER-2 again negative  (2) Oncotype DX score of 12  predicts a 10 year risk of recurrence outside the breast of 8% the patient's only systemic therapy is tamoxifen for 5 years. It also protects no benefit from adjuvant chemotherapy. He  (3) adjuvant radiation 07/06/2016-08/22/2016 Site/dose:   1) right breast / 45 Gy in 25 fractions, 2) right breast boost / 16 Gy in 8 fractions  (4) started tamoxifen 09/27/2016   PLAN: Ajai continues on Tamoxifen daily for her breast cancer with good tolerance.  She will continue this.  She will also continue the Effexor as prescribed by Dr. Jana Hakim.  She has no clinical or radiographic evidence of breast cancer recurrence.    Tamiki will undergo repeat mammogram in 05/2021 when due.    Beverlie and I talked about her shoulder.  This a lot of times is arthritis, however, I will get an xray of her shoulder to fully evaluate.  She plans on following up with Dr. Melford Aase about this.   We will see Niaomi back in 1 year for labs and f/u.  At that point she will be able to stop her Tamoxifen and discuss graduation.  She knows to call for any questions that may arise between now and her next appointment.  We are happy to see her sooner if needed.   Total encounter time: 30 minutes*  Wilber Bihari, NP 07/08/20 12:41 PM Medical Oncology and Hematology [facility] [place], [place] [address] Tel. [phone]    Fax. [phone]  *Total Encounter Time as defined by the Centers for Medicare and Medicaid Services includes, in addition to the face-to-face time of a patient visit (documented in the note above) non-face-to-face time: obtaining and reviewing outside history, ordering and reviewing medications, tests or procedures, care coordination (communications with other health care professionals or caregivers) and documentation in the medical record.

## 2020-07-09 ENCOUNTER — Telehealth: Payer: Self-pay | Admitting: Oncology

## 2020-07-09 ENCOUNTER — Telehealth: Payer: Self-pay

## 2020-07-09 NOTE — Telephone Encounter (Signed)
Called to give pt message below. Pt did not answer; LVM for pt to call [phone] for results.

## 2020-07-09 NOTE — Telephone Encounter (Signed)
Scheduled appts per 8/12 los. Pt confirmed appt date and time.

## 2020-07-09 NOTE — Telephone Encounter (Signed)
Pt returned call. Results given. Verbalized thanks and understanding.

## 2020-07-09 NOTE — Telephone Encounter (Signed)
-----   Message from Gardenia Phlegm, NP sent at 07/09/2020  9:32 AM EDT ----- Please let patient know xrays are normal.  Thank you.    Mendel Ryder ----- Message ----- From: Buel Ream, Rad Results In Sent: 07/09/2020   9:07 AM EDT To: Gardenia Phlegm, NP

## 2020-07-17 ENCOUNTER — Other Ambulatory Visit: Payer: Self-pay | Admitting: Internal Medicine

## 2020-07-17 DIAGNOSIS — E039 Hypothyroidism, unspecified: Secondary | ICD-10-CM

## 2020-07-17 MED ORDER — LEVOTHYROXINE SODIUM 100 MCG PO TABS: ORAL_TABLET | ORAL | 0 refills | Status: DC

## 2020-08-08 NOTE — Progress Notes (Signed)
History of Present Illness:       This very nice 64 y.o.  MWF presents for 6 month follow up with HTN, HLD, Pre-Diabetes, Hypothyroidism and Vitamin D Deficiency.  Patient's GERD is controlled on her Meds. Patient has hx/o Rt Breast Ca (2017) followed by Dr Jana Hakim on Tamoxifen for 5 yrs thru 2022.        Today, Patient also reports 6-7 week hx/o Lt ant shoulder pains with extremis of ROM.        Patient has been on SS Disability for Fibromyalgiaand Chronic Pain Syndromefrom DDD/DJD and Interstitial Cystitis  since 2005.        Patient is treated for HTN  (2004) & BP has been controlled at home. Today's BP is at goal -  124/78. Patient has had no complaints of any cardiac type chest pain, palpitations, dyspnea / orthopnea / PND, dizziness, claudication, or dependent edema.       Hyperlipidemia is controlled with diet & Rosuvastatin. Patient denies myalgias or other med SE's. Last Lipids were at goal:  Lab Results  Component Value Date   CHOL 112 05/04/2020   HDL 50 05/04/2020   LDLCALC 45 05/04/2020   TRIG 91 05/04/2020   CHOLHDL 2.2 05/04/2020    Also, the patient has moderate obesity (BMI 31+) and history of  PreDiabetes (A1c 6.1% / 2011) and has had no symptoms of reactive hypoglycemia, diabetic polys, paresthesias or visual blurring.  Last A1c was not at goal:  Lab Results  Component Value Date   HGBA1C 5.9 (H) 01/26/2020           Patient was dx'd Hypothyroid and has been on Thyroid Replacement since 2002 .         Further, the patient also has history of Vitamin D Deficiency ("11" / 2008)  and supplements vitamin D without any suspected side-effects. Last vitamin D was at goal:  Lab Results  Component Value Date   VD25OH 62 01/26/2020    Current Outpatient Medications on File Prior to Visit  Medication Sig  . ALPRAZolam (XANAX) 0.5 MG tablet TAKE 1/2-1 TABLET BY MOUTH 2-3 TIMES DAILY IF NEEDED FOR ANXIETY ATTACKS AND PLEASE TRY TO LIMIT TO 5 DAYS A WEEK  TO AVOID ADDICTION  . Ascorbic Acid (VITAMIN C) 1000 MG tablet Take 1,000 mg by mouth daily.  . cetirizine (ZYRTEC) 10 MG tablet Take 10 mg by mouth daily.   . Cholecalciferol (VITAMIN D-3) 5000 UNITS TABS Take 5,000 Units by mouth 2 (two) times daily.   . cholestyramine (QUESTRAN) 4 g packet Take 1 packet (4 g total) by mouth 2 (two) times daily.  Marland Kitchen ezetimibe (ZETIA) 10 MG tablet Take 1 tablet daily for Cholesterol  . famotidine (PEPCID) 40 MG tablet Take 1 tablet (40 mg total) by mouth as needed.  . fluticasone (FLONASE) 50 MCG/ACT nasal spray USE TWO SPRAY(S) IN EACH NOSTRIL ONCE DAILY AS NEEDED  . gabapentin (NEURONTIN) 600 MG tablet Take 1/2 to 1 tablet   2 to 3 x /day as needed to control pain  . hyoscyamine (LEVSIN SL) 0.125 MG SL tablet DISSOLVE 1 TABLET IN MOUTH EVERY 6 HOURS AS NEEDED  . ketoconazole (NIZORAL) 2 % cream Apply Daily to Ringworm Skin  Infection  . levothyroxine (SYNTHROID) 100 MCG tablet Take 1 tablet Daily on an empty stomach with only water for 30 minutes & no Antacid meds, Calcium or Magnesium for 4 hours & avoid Biotin  . Multiple Vitamin (  MULTIVITAMIN PO) Take 1 tablet by mouth daily. Takes [employer]  . potassium chloride SA (KLOR-CON) 20 MEQ tablet Take 1 tablet 2 x /day for Potassium  . rosuvastatin (CRESTOR) 20 MG tablet Take 1 tablet Daily for Cholesterol  . tamoxifen (NOLVADEX) 20 MG tablet Take 1 tablet by mouth once daily  . venlafaxine XR (EFFEXOR-XR) 37.5 MG 24 hr capsule Take 1 capsule (37.5 mg total) by mouth daily with breakfast.  . zinc gluconate 50 MG tablet Take 50 mg by mouth daily.   No current facility-administered medications on file prior to visit.    Allergies  Allergen Reactions  . Iodine   . Lipitor [Atorvastatin] Other (See Comments)    Muscle and legs aches  . Shellfish Allergy Diarrhea and Nausea And Vomiting  . Betadine [Povidone Iodine] Rash    PMHx:   Past Medical History:  Diagnosis Date  . Anxiety   . Breast cancer ([place])     . Complication of anesthesia    hypotension  . DDD (degenerative disc disease), lumbar   . Erosion of vaginal mesh ([place])   . Fibromyalgia   . GERD (gastroesophageal reflux disease)   . History of gastric ulcer   . History of radiation therapy 07/06/16- 08/22/16   Right Breast  . Hypertension   . Hypothyroidism   . Personal history of radiation therapy   . Rectocele     Immunization History  Administered Date(s) Administered  . Influenza Inj Mdck Quad With Preservative 09/04/2017, 09/30/2018  . Influenza Split 09/18/2013, 09/25/2014, 10/21/2019  . Influenza, Seasonal, Injecte, Preservative Fre 10/11/2015  . Influenza,inj,quad, With Preservative 08/28/2016, 08/27/2017  . Influenza-Unspecified 08/27/2016  . PFIZER SARS-COV-2 Vaccination 02/20/2020, 03/12/2020  . Pneumococcal-Unspecified 11/27/2000  . Tdap 03/16/2011    Past Surgical History:  Procedure Laterality Date  . BLADDER TACK  2003  . BREAST BIOPSY Right 04/18/2016  . BREAST LUMPECTOMY Right 05/18/2016  . Breast mass removal Left 1985   Benign  . CARDIOVASCULAR STRESS TEST  07-02-2013  DR DALTON MCLEAN   LOW RISK NUCLEAR STUDY WITH A SMALL, MILD APICAL SEPTAL FIXED PERFUSION DEFECT .  GIVEN NORMAL WALL FUNCTION , SUSPECT REPRESENTS ATTENUATION/  EF 74%/  NO ISCHEMIA  . CATARACT EXTRACTION W/ INTRAOCULAR LENS IMPLANT Left 01/2014  . CATARACT EXTRACTION W/ INTRAOCULAR LENS IMPLANT Right 12/2017   Dr. Manuella Ghazi  . CHOLECYSTECTOMY N/A 12/10/2017   Procedure: LAPAROSCOPIC CHOLECYSTECTOMY;  Surgeon: Rolm Bookbinder, MD;  Location: [facility];  Service: General;  Laterality: N/A;  . CYSTO/  HYDRODISTENTION/  BLADDER BX/   INSTILLATION THERAPY  08-29-2010  . EXCISION OF MESH N/A 03/23/2014   Procedure: EXCISION OF VAGINAL AND PERI-URETHRAL MESH, EXTRACTION FROM URETHRA TO APEX, insertion of XENFORM in the vaginal vault;  Surgeon: Ailene Rud, MD;  Location: [facility];  Service: Urology;  Laterality: N/A;  .  EXCISION VAGINAL CYST N/A 04/06/2014   Procedure: VAGINAL SPECULUM EXAMINATION/POSSIBLE DRAINAGE OF HEMATOMA;  Surgeon: Ailene Rud, MD;  Location[facility];  Service: Urology;  Laterality: N/A;  . INCONTINENCE SURGERY  2009  &  2005  . RADIOACTIVE SEED GUIDED PARTIAL MASTECTOMY WITH AXILLARY SENTINEL LYMPH NODE BIOPSY Right 05/18/2016   Procedure: RADIOACTIVE SEED GUIDED RIGHT BREAST LUMPECTOMY WITH AXILLARY SENTINEL LYMPH NODE BIOPSY;  Surgeon: Rolm Bookbinder, MD;  Location: [facility];  Service: General;  Laterality: Right;  RADIOACTIVE SEED GUIDED RIGHT BREAST LUMPECTOMY WITH AXILLARY SENTINEL LYMPH NODE BIOPSY  . TRANSTHORACIC ECHOCARDIOGRAM  06-16-2013  GRADE I DIASTOLIC DYSFUNCTION/  EF 55-60%/  MILD AR/  TRIVIAL MR  &  TR  . VAGINAL HYSTERECTOMY  2000   w/  unilateral salpingoophorectomy  . VEIN BYPASS SURGERY  AGE 51   RIGHT LEG    FHx:    Reviewed / unchanged  SHx:    Reviewed / unchanged   Systems Review:  Constitutional: Denies fever, chills, wt changes, headaches, insomnia, fatigue, night sweats, change in appetite. Eyes: Denies redness, blurred vision, diplopia, discharge, itchy, watery eyes.  ENT: Denies discharge, congestion, post nasal drip, epistaxis, sore throat, earache, hearing loss, dental pain, tinnitus, vertigo, sinus pain, snoring.  CV: Denies chest pain, palpitations, irregular heartbeat, syncope, dyspnea, diaphoresis, orthopnea, PND, claudication or edema. Respiratory: denies cough, dyspnea, DOE, pleurisy, hoarseness, laryngitis, wheezing.  Gastrointestinal: Denies dysphagia, odynophagia, heartburn, reflux, water brash, abdominal pain or cramps, nausea, vomiting, bloating, diarrhea, constipation, hematemesis, melena, hematochezia  or hemorrhoids. Genitourinary: Denies dysuria, frequency, urgency, nocturia, hesitancy, discharge, hematuria or flank pain. Musculoskeletal: Denies arthralgias, myalgias, stiffness, jt.  swelling, pain, limping or strain/sprain.  Skin: Denies pruritus, rash, hives, warts, acne, eczema or change in skin lesion(s). Neuro: No weakness, tremor, incoordination, spasms, paresthesia or pain. Psychiatric: Denies confusion, memory loss or sensory loss. Endo: Denies change in weight, skin or hair change.  Heme/Lymph: No excessive bleeding, bruising or enlarged lymph nodes.  Physical Exam  BP 124/78   Pulse 64   Temp (!) 97.1 F (36.2 C)   Resp 16   Ht 5\' 3"  (1.6 m)   Wt 171 lb 9.6 oz (77.8 kg)   BMI 30.40 kg/m   Appears  well nourished, well groomed  and in no distress.  Eyes: PERRLA, EOMs, conjunctiva no swelling or erythema. Sinuses: No frontal/maxillary tenderness ENT/Mouth: EAC's clear, TM's nl w/o erythema, bulging. Nares clear w/o erythema, swelling, exudates. Oropharynx clear without erythema or exudates. Oral hygiene is good. Tongue normal, non obstructing. Hearing intact.  Neck: Supple. Thyroid not palpable. Car 2+/2+ without bruits, nodes or JVD. Chest: Respirations nl with BS clear & equal w/o rales, rhonchi, wheezing or stridor.  Cor: Heart sounds normal w/ regular rate and rhythm without sig. murmurs, gallops, clicks or rubs. Peripheral pulses normal and equal  without edema.  Abdomen: Soft & bowel sounds normal. Non-tender w/o guarding, rebound, hernias, masses or organomegaly.  Lymphatics: Unremarkable.  Musculoskeletal: Full ROM all peripheral extremities, joint stability, 5/5 strength and normal gait.  (+) exquisite tender point tenderness of superior anterior joint line of Lt shoulder.  Skin: Warm, dry without exposed rashes, lesions or ecchymosis apparent.  Neuro: Cranial nerves intact, reflexes equal bilaterally. Sensory-motor testing grossly intact. Tendon reflexes grossly intact.  Pysch: Alert & oriented x 3.  Insight and judgement nl & appropriate. No ideations.  Assessment and Plan:  1. Essential hypertension  - Continue medication, monitor blood  pressure at home.  - Continue DASH diet.  Reminder to go to the ER if any CP,  SOB, nausea, dizziness, severe HA, changes vision/speech.  - CBC with Differential/Platelet - COMPLETE METABOLIC PANEL WITH GFR - Magnesium - TSH  2. Hyperlipidemia, mixed  - Continue diet/meds, exercise,& lifestyle modifications.  - Continue monitor periodic cholesterol/liver & renal functions   - Lipid panel - TSH  3. Abnormal glucose  - Continue diet, exercise  - Lifestyle modifications.  - Monitor appropriate labs.  - Hemoglobin A1c - Insulin, random  4. Vitamin D deficiency  - Continue supplementation.  - VITAMIN D 25 Hydroxy   5. Hypothyroidism  -  TSH  6. Gastroesophageal reflux disease  - CBC with Differential/Platelet  7. Medication management  - CBC with Differential/Platelet - COMPLETE METABOLIC PANEL WITH GFR - Magnesium - Lipid panel - TSH - Hemoglobin A1c - Insulin, random - VITAMIN D 25 Hydroxy          Discussed  regular exercise, BP monitoring, weight control to achieve/maintain BMI less than 25 and discussed med and SE's. Recommended labs to assess and monitor clinical status with further disposition pending results of labs.  I discussed the assessment and treatment plan with the patient. The patient was provided an opportunity to ask questions and all were answered. The patient agreed with the plan and demonstrated an understanding of the instructions.  I provided over 30 minutes of exam, counseling, chart review and  complex critical decision making.         Patient desires to try a steroid taper for her Lt shoulder painThe patient was advised to call back or seek an in-person evaluation if the symptoms worsen or if the condition fails to improve as anticipated.   Kirtland Bouchard, MD

## 2020-08-08 NOTE — Patient Instructions (Signed)

## 2020-08-09 ENCOUNTER — Encounter: Payer: Self-pay | Admitting: Internal Medicine

## 2020-08-09 ENCOUNTER — Other Ambulatory Visit: Payer: Self-pay | Admitting: Adult Health

## 2020-08-09 ENCOUNTER — Other Ambulatory Visit: Payer: Self-pay

## 2020-08-09 ENCOUNTER — Ambulatory Visit (INDEPENDENT_AMBULATORY_CARE_PROVIDER_SITE_OTHER): Payer: Medicare Other | Admitting: Internal Medicine

## 2020-08-09 VITALS — BP 124/78 | HR 64 | Temp 97.1°F | Resp 16 | Ht 63.0 in | Wt 171.6 lb

## 2020-08-09 DIAGNOSIS — R7309 Other abnormal glucose: Secondary | ICD-10-CM | POA: Diagnosis not present

## 2020-08-09 DIAGNOSIS — F419 Anxiety disorder, unspecified: Secondary | ICD-10-CM

## 2020-08-09 DIAGNOSIS — E782 Mixed hyperlipidemia: Secondary | ICD-10-CM

## 2020-08-09 DIAGNOSIS — I10 Essential (primary) hypertension: Secondary | ICD-10-CM

## 2020-08-09 DIAGNOSIS — E039 Hypothyroidism, unspecified: Secondary | ICD-10-CM

## 2020-08-09 DIAGNOSIS — K219 Gastro-esophageal reflux disease without esophagitis: Secondary | ICD-10-CM

## 2020-08-09 DIAGNOSIS — E559 Vitamin D deficiency, unspecified: Secondary | ICD-10-CM

## 2020-08-09 DIAGNOSIS — Z79899 Other long term (current) drug therapy: Secondary | ICD-10-CM

## 2020-08-09 DIAGNOSIS — M25512 Pain in left shoulder: Secondary | ICD-10-CM

## 2020-08-09 MED ORDER — ALPRAZOLAM 0.5 MG PO TABS: ORAL_TABLET | ORAL | 0 refills | Status: DC

## 2020-08-09 MED ORDER — DEXAMETHASONE 4 MG PO TABS: ORAL_TABLET | ORAL | 0 refills | Status: DC

## 2020-08-10 LAB — COMPLETE METABOLIC PANEL WITH GFR
AG Ratio: 1.7 (calc) (ref 1.0–2.5)
ALT: 23 U/L (ref 6–29)
AST: 29 U/L (ref 10–35)
Albumin: 3.8 g/dL (ref 3.6–5.1)
Alkaline phosphatase (APISO): 58 U/L (ref 37–153)
BUN: 11 mg/dL (ref 7–25)
CO2: 30 mmol/L (ref 20–32)
Calcium: 8.6 mg/dL (ref 8.6–10.4)
Chloride: 101 mmol/L (ref 98–110)
Creat: 0.55 mg/dL (ref 0.50–0.99)
GFR, Est African American: 117 mL/min/{1.73_m2} (ref >=60)
GFR, Est Non African American: 101 mL/min/{1.73_m2} (ref >=60)
Globulin: 2.2 g/dL (calc) (ref 1.9–3.7)
Glucose, Bld: 88 mg/dL (ref 65–99)
Potassium: 4.1 mmol/L (ref 3.5–5.3)
Sodium: 138 mmol/L (ref 135–146)
Total Bilirubin: 0.3 mg/dL (ref 0.2–1.2)
Total Protein: 6 g/dL — ABNORMAL LOW (ref 6.1–8.1)

## 2020-08-10 LAB — TSH: TSH: 1.69 mIU/L (ref 0.40–4.50)

## 2020-08-10 LAB — CBC WITH DIFFERENTIAL/PLATELET
Absolute Monocytes: 712 cells/uL (ref 200–950)
Basophils Absolute: 36 cells/uL (ref 0–200)
Basophils Relative: 0.4 %
Eosinophils Absolute: 62 cells/uL (ref 15–500)
Eosinophils Relative: 0.7 %
HCT: 40.4 % (ref 35.0–45.0)
Hemoglobin: 13.2 g/dL (ref 11.7–15.5)
Lymphs Abs: 4530 cells/uL — ABNORMAL HIGH (ref 850–3900)
MCH: 27.8 pg (ref 27.0–33.0)
MCHC: 32.7 g/dL (ref 32.0–36.0)
MCV: 85.1 fL (ref 80.0–100.0)
MPV: 10.1 fL (ref 7.5–12.5)
Monocytes Relative: 8 %
Neutro Abs: 3560 cells/uL (ref 1500–7800)
Neutrophils Relative %: 40 %
Platelets: 244 10*3/uL (ref 140–400)
RBC: 4.75 10*6/uL (ref 3.80–5.10)
RDW: 12.9 % (ref 11.0–15.0)
Total Lymphocyte: 50.9 %
WBC: 8.9 10*3/uL (ref 3.8–10.8)

## 2020-08-10 LAB — VITAMIN D 25 HYDROXY (VIT D DEFICIENCY, FRACTURES): Vit D, 25-Hydroxy: 95 ng/mL (ref 30–100)

## 2020-08-10 LAB — INSULIN, RANDOM: Insulin: 20 u[IU]/mL — ABNORMAL HIGH

## 2020-08-10 LAB — LIPID PANEL
Cholesterol: 125 mg/dL (ref <200)
HDL: 63 mg/dL (ref >=50)
LDL Cholesterol (Calc): 46 mg/dL (calc)
Non-HDL Cholesterol (Calc): 62 mg/dL (calc) (ref <130)
Total CHOL/HDL Ratio: 2 (calc) (ref <5.0)
Triglycerides: 75 mg/dL (ref <150)

## 2020-08-10 LAB — HEMOGLOBIN A1C
Hgb A1c MFr Bld: 5.9 % of total Hgb — ABNORMAL HIGH (ref <5.7)
Mean Plasma Glucose: 123 (calc)
eAG (mmol/L): 6.8 (calc)

## 2020-08-10 LAB — MAGNESIUM: Magnesium: 1.9 mg/dL (ref 1.5–2.5)

## 2020-08-10 NOTE — Progress Notes (Signed)
==========================================================  -    Total chol = 125 -  Excellent   - Very low risk for Heart Attack  / Stroke =============================================================  - A12 = 5.9%  - Blood sugar and A1c are  STILL elevated in the borderline and  early or pre-diabetes range which has the same   300% increased risk for heart attack, stroke, cancer and   alzheimer- type vascular dementia as full blown diabetes.   But the good news is that diet, exercise with  weight loss can cure the early diabetes at this point. ==========================================================  -  It is very important that you work harder with diet by  avoiding all foods that are white except chicken,  fish & calliflower.  - Avoid white rice  (brown & wild rice is OK),   - Avoid white potatoes  (sweet potatoes in moderation is OK),   White bread or wheat bread or anything made out of   white flour like bagels, donuts, rolls, buns, biscuits, cakes,  - pastries, cookies, pizza crust, and pasta (made from  white flour & egg whites)   - vegetarian pasta or spinach or wheat pasta is OK.  - Multigrain breads like Arnold's, Pepperidge [employer] or   multigrain sandwich thins or high fiber breads like   E[place] bread or "Dave's Killer" breads that are  4 to 5 grams fiber per slice !  are best.    Diet, exercise and weight loss can reverse and cure  diabetes in the early stages.   ==========================================================  -  Vitamin D = 95 - Excellent  ==========================================================  -  All Else - CBC - Kidneys - Electrolytes - Liver - Magnesium & Thyroid    - all  Normal / OK ==========================================================   - Keep up the [place] Work  ! ==========================================================

## 2020-09-05 ENCOUNTER — Other Ambulatory Visit: Payer: Self-pay | Admitting: Oncology

## 2020-09-15 ENCOUNTER — Ambulatory Visit (INDEPENDENT_AMBULATORY_CARE_PROVIDER_SITE_OTHER): Payer: Medicare Other | Admitting: *Deleted

## 2020-09-15 ENCOUNTER — Other Ambulatory Visit: Payer: Self-pay

## 2020-09-15 VITALS — Temp 96.0°F

## 2020-09-15 DIAGNOSIS — Z23 Encounter for immunization: Secondary | ICD-10-CM

## 2020-09-22 ENCOUNTER — Other Ambulatory Visit: Payer: Self-pay | Admitting: Oncology

## 2020-10-12 ENCOUNTER — Other Ambulatory Visit: Payer: Self-pay | Admitting: Internal Medicine

## 2020-11-09 ENCOUNTER — Other Ambulatory Visit: Payer: Self-pay

## 2020-11-09 ENCOUNTER — Encounter: Payer: Self-pay | Admitting: Adult Health Nurse Practitioner

## 2020-11-09 ENCOUNTER — Ambulatory Visit (INDEPENDENT_AMBULATORY_CARE_PROVIDER_SITE_OTHER): Payer: Medicare Other | Admitting: Adult Health Nurse Practitioner

## 2020-11-09 VITALS — BP 126/88 | HR 75 | Temp 97.5°F | Ht 63.0 in | Wt 179.0 lb

## 2020-11-09 DIAGNOSIS — E039 Hypothyroidism, unspecified: Secondary | ICD-10-CM | POA: Diagnosis not present

## 2020-11-09 DIAGNOSIS — K580 Irritable bowel syndrome with diarrhea: Secondary | ICD-10-CM

## 2020-11-09 DIAGNOSIS — E782 Mixed hyperlipidemia: Secondary | ICD-10-CM | POA: Diagnosis not present

## 2020-11-09 DIAGNOSIS — Z79899 Other long term (current) drug therapy: Secondary | ICD-10-CM

## 2020-11-09 DIAGNOSIS — E876 Hypokalemia: Secondary | ICD-10-CM

## 2020-11-09 DIAGNOSIS — K5790 Diverticulosis of intestine, part unspecified, without perforation or abscess without bleeding: Secondary | ICD-10-CM

## 2020-11-09 DIAGNOSIS — Z6830 Body mass index (BMI) 30.0-30.9, adult: Secondary | ICD-10-CM

## 2020-11-09 DIAGNOSIS — N8111 Cystocele, midline: Secondary | ICD-10-CM

## 2020-11-09 DIAGNOSIS — F419 Anxiety disorder, unspecified: Secondary | ICD-10-CM

## 2020-11-09 DIAGNOSIS — I10 Essential (primary) hypertension: Secondary | ICD-10-CM

## 2020-11-09 DIAGNOSIS — M797 Fibromyalgia: Secondary | ICD-10-CM

## 2020-11-09 DIAGNOSIS — R7309 Other abnormal glucose: Secondary | ICD-10-CM

## 2020-11-09 DIAGNOSIS — E559 Vitamin D deficiency, unspecified: Secondary | ICD-10-CM

## 2020-11-09 DIAGNOSIS — K219 Gastro-esophageal reflux disease without esophagitis: Secondary | ICD-10-CM

## 2020-11-09 DIAGNOSIS — J301 Allergic rhinitis due to pollen: Secondary | ICD-10-CM

## 2020-11-09 NOTE — Progress Notes (Addendum)
FOLLOW UP 3 MONTH  Assessment:   Desiree Anderson was seen today for 3 month follow-up   Diagnoses and all orders for this visit:  Essential hypertension No medications at this time Monitor blood pressure at home; call if consistently over 130/80 Continue DASH diet.   Reminder to go to the ER if any CP, SOB, nausea, dizziness, severe HA, changes vision/speech, left arm numbness and tingling and jaw pain. -     CBC with Differential/Platelet -     COMPLETE METABOLIC PANEL WITH GFR -     Magnesium  Hyperlipidemia, mixed Continue medications: Crestor 20mg  and zetia 10mg  Continue low cholesterol diet and exercise.  To goal defer lipids this check.  Vitamin D deficiency Continue supplementation -     VITAMIN D 25 Hydroxy (Vit-D Deficiency, Fractures)  Hypothyroidism, unspecified type Will check labs today, some hyper symptoms -     levothyroxine (SYNTHROID) 100 MCG tablet; M< W< F whole tab(3days a week),Take half tablet (79mcg) four days a week.Take on an empty stomach with only water for 30 minutes & no Antacid meds for 4 hours.  Gastroesophageal reflux disease, esophagitis presence not specified Doing well at this time Continue: famotidine 40mg  BID Diet discussed Monitor for triggers Avoid food with high acid content Avoid excessive cafeine Increase water intake  Fibromyalgia Syndrome Doing well at this time Management with gabapentin at night few times a week when she has flares  Cystocele, midline Follows with Dr Harrington Challenger for this  Diverticulosis Doing well IBS-D Doing well Has changed diertary intake Taking hyoscyamine 0.125mg  once a week  Hypokalemia Taking Rx supplementation -CMP  Anxiety Doing well on current regiment: Effexor-XR 37.5mg  daily and Alprazolam 0.5mg  half tablet Discussed taking only PRN and limiting to 5 days a week to avoid addiction  Abnormal glucose Discussed dietary and exercise modifications -     Hemoglobin A1c  Medication  management Continued  Seasonal allergic rhinitis Continue Flonase daily Consider changing antihistamines May use sudafed as directed, monitor blood pressure.  BMI 30.0-30.9,adult Discussed dietary and exercise modifications   Over 30 minutes of face to face exam, counseling, chart review and critical decision making was performed.  Future Appointments  Date Time Provider [place]  02/02/2021  2:00 PM Unk Pinto, MD [facility] None  07/11/2021 11:00 AM [facility] None  07/11/2021 11:30 AM Magrinat, Virgie Dad, MD [facility] None     HPI:  Desiree Anderson is a 64 y.o. female who presents for3 month follow up on HTN, HLD, hypothyroidism, GERD, weight and vitamin D Defciency.  She continues to have right shoulder pain.  She is using biofreeze, tylenol as needed. She is also doing heat and ice.  She is finishing a round of dexamethasone.   make her face break out She does get redness to the face when taking this medication. She only has a couple doses left.  Discussed next plan if not resolved.  Follow with Dr Lorin Mercy (ortho) for previous.  She has history of fibromyalgia and chronic pain from DDD/DJD.  Also has history of breast cancer, right, s/p mastectomy (04/2016) with radiation and chemo.  She is taking Tamoxifen x5 years and follows with Dr Jana Hakim.  Has been taking Zyrtec as needed, nightly for allergy symptoms.  She started taking flonase daily,  She reports some pressure behind her head.   HTN predates 2004.  Her blood pressure has been controlled at home, today their BP is BP: 126/88 She does workout. She denies chest pain, shortness of  breath, dizziness.   She is on cholesterol medication and denies myalgias. Her cholesterol is not at goal. The cholesterol last visit was:   Lab Results  Component Value Date   CHOL 125 08/09/2020   HDL 63 08/09/2020   LDLCALC 46 08/09/2020   TRIG 75 08/09/2020   CHOLHDL 2.0 08/09/2020    She has been working on  diet and exercise for abnormal glucose/prediabetes, and denies hyperglycemia, hypoglycemia , nausea, polydipsia, polyuria and visual disturbances. Last A1C in the office was:  Lab Results  Component Value Date   HGBA1C 5.9 (H) 01/02/2019   Last GFR:   Lab Results  Component Value Date   GFRNONAA 96 01/02/2019   Lab Results  Component Value Date   GFRAA 111 01/02/2019   Patient is on Vitamin D supplement.  Deficiency noted in 09/2007.  Last check she was at goal. Lab Results  Component Value Date   VD25OH 67 01/02/2019     She has been on thyroid replacement since 2002. Currently taking levothyroxine 73mcg whole tablet three days a week and half tablet four days a week. Her medication was changed last visit. Lab Results  Component Value Date   TSH 0.29 (L) 04/15/2019      Medication Review: Current Outpatient Medications on File Prior to Visit  Medication Sig Dispense Refill  . ALPRAZolam (XANAX) 0.5 MG tablet Take     1/2 - 1 tablet 2 - 3 x /day     ONLY     if needed for Anxiety Attack &  limit to 5 days /week to avoid Addiction & Dementia 90 tablet 0  . Ascorbic Acid (VITAMIN C) 1000 MG tablet Take 1,000 mg by mouth daily.    . cetirizine (ZYRTEC) 10 MG tablet Take 10 mg by mouth daily.    . Cholecalciferol (VITAMIN D-3) 5000 UNITS TABS Take 5,000 Units by mouth 2 (two) times daily.     . cholestyramine (QUESTRAN) 4 g packet Take 1 packet (4 g total) by mouth 2 (two) times daily. 60 each 5  . dexamethasone (DECADRON) 4 MG tablet Take 1 tab 3 x day - 3 days, then 2 x day - 3 days, then 1 tab daily 20 tablet 0  . ezetimibe (ZETIA) 10 MG tablet Take 1 tablet daily for Cholesterol 90 tablet 1  . famotidine (PEPCID) 40 MG tablet Take 1 tablet (40 mg total) by mouth as needed.    . fluticasone (FLONASE) 50 MCG/ACT nasal spray USE TWO SPRAY(S) IN EACH NOSTRIL ONCE DAILY AS NEEDED 48 g 3  . gabapentin (NEURONTIN) 600 MG tablet Take 1/2 to 1 tablet   2 to 3 x /day as needed to  control pain 270 tablet 0  . hyoscyamine (LEVSIN SL) 0.125 MG SL tablet DISSOLVE 1 TABLET IN MOUTH EVERY 6 HOURS AS NEEDED 60 tablet 0  . ketoconazole (NIZORAL) 2 % cream Apply Daily to Ringworm Skin  Infection 30 g 1  . levothyroxine (SYNTHROID) 100 MCG tablet Take 1 tablet Daily on an empty stomach with only water for 30 minutes & no Antacid meds, Calcium or Magnesium for 4 hours & avoid Biotin 90 tablet 0  . Multiple Vitamin (MULTIVITAMIN PO) Take 1 tablet by mouth daily. Takes [employer]    . potassium chloride SA (KLOR-CON) 20 MEQ tablet Take      1 tablet       2 x /day        for Potassium 180 tablet 0  . rosuvastatin (  CRESTOR) 20 MG tablet Take 1 tablet Daily for Cholesterol 90 tablet 3  . tamoxifen (NOLVADEX) 20 MG tablet Take 1 tablet by mouth once daily 90 tablet 0  . venlafaxine XR (EFFEXOR-XR) 37.5 MG 24 hr capsule TAKE 1 CAPSULE BY MOUTH ONCE DAILY WITH BREAKFAST 90 capsule 0  . zinc gluconate 50 MG tablet Take 50 mg by mouth daily.     No current facility-administered medications on file prior to visit.    Allergies  Allergen Reactions  . Iodine   . Lipitor [Atorvastatin] Other (See Comments)    Muscle and legs aches  . Shellfish Allergy Diarrhea and Nausea And Vomiting  . Betadine [Povidone Iodine] Rash    Current Problems (verified) Patient Active Problem List   Diagnosis Date Noted  . Insomnia 03/25/2018  . Situational anxiety 03/25/2018  . Encounter for Medicare annual wellness exam 02/19/2017  . Tobacco chew use 05/15/2016  . Malignant neoplasm of upper-outer quadrant of right breast in female, estrogen receptor positive ([place]) 04/21/2016  . Hypothyroidism 04/18/2016  . Obesity (BMI 30.0-34.9) 10/11/2015  . Medication management 09/24/2014  . Cystocele, midline 03/23/2014  . Essential hypertension 12/10/2013  . Mixed hyperlipidemia 12/10/2013  . Prediabetes 12/10/2013  . Vitamin D deficiency 12/10/2013  . DJD  12/10/2013  . DDD, lumbar 12/10/2013  .  Fibromyalgia Syndrome 12/10/2013    Screening Tests Immunization History  Administered Date(s) Administered  . Influenza Inj Mdck Quad With Preservative 09/04/2017, 09/30/2018, 09/15/2020  . Influenza Split 09/18/2013, 09/25/2014, 10/21/2019  . Influenza, Seasonal, Injecte, Preservative Fre 10/11/2015  . Influenza,inj,quad, With Preservative 08/28/2016, 08/27/2017  . Influenza-Unspecified 08/27/2016  . PFIZER SARS-COV-2 Vaccination 02/20/2020, 03/12/2020  . Pneumococcal-Unspecified 11/27/2000  . Tdap 03/16/2011    Preventative care: Last colonoscopy:over 13 years ago,  02/2017 Cologuard, 2/21 next 10years Last mammogram: 05/2019, schedule for 2021 Last pap smear/pelvic exam: 03/2016  - Dr Harrington Challenger DEXA: At age age 38years  Prior vaccinations: TD or Tdap: 2012  Influenza: 2019 Pneumococcal: 2002 Prevnar13: Due at age 86 Shingles/Zostavax: DUE, discussed Shingrix SARS-COV2-Pfizer complete 02/2020, Booster: Scheduled   Names of Other Physician/Practitioners you currently use: 1. [facility] here for primary care 2. Eye Exam Dr Manuella Ghazi 02/2018, DUE 3. Dental Exam Dr Minerva Fester, 5 years ago, DUE  Patient Care Team: Unk Pinto, MD as PCP - General (Internal Medicine) Fay Records, MD as Consulting Physician (Cardiology) [name], MD as Consulting Physician (Orthopedic Surgery) Carolan Clines, MD (Inactive) as Consulting Physician (Urology) Vanessa Kick, MD as Consulting Physician (Obstetrics and Gynecology) Magrinat, Virgie Dad, MD as Consulting Physician (Oncology) Rolm Bookbinder, MD as Consulting Physician (General Surgery) Thea Silversmith, MD as Consulting Physician (Radiation Oncology)  SURGICAL HISTORY She  has a past surgical history that includes Incontinence surgery (2009  &  2005); BLADDER TACK (2003); Vein bypass surgery (AGE 65); transthoracic echocardiogram (06-16-2013); Cardiovascular stress test (07-02-2013  [facility]); CYSTO/  HYDRODISTENTION/  BLADDER BX/   INSTILLATION THERAPY (08-29-2010); Vaginal hysterectomy (2000); Cataract extraction w/ intraocular lens implant (Left, 01/2014); Excision of mesh (N/A, 03/23/2014); Excision vaginal cyst (N/A, 04/06/2014); Radioactive seed guided mastectomy with axillary sentinel lymph node biopsy (Right, 05/18/2016); Breast lumpectomy (Right, 05/18/2016); Breast biopsy (Right, 04/18/2016); Cholecystectomy (N/A, 12/10/2017); Cataract extraction w/ intraocular lens implant (Right, 12/2017); and Breast mass removal (Left, 1985). FAMILY HISTORY Her family history includes Diabetes in her mother; Diverticulitis in her mother and sister; Intestinal polyp in her sister; Lung cancer in her brother. SOCIAL HISTORY She  reports that she quit smoking about 24 years ago. Her smoking use included cigarettes. She quit after 20.00 years of use. Her smokeless tobacco use includes snuff. She reports that she does not drink alcohol and does not use drugs.    Objective:     Today's Vitals   11/09/20 1149  BP: 126/88  Pulse: 75  Temp: (!) 97.5 F (36.4 C)  SpO2: 99%  Weight: 179 lb (81.2 kg)  Height: 5\' 3"  (1.6 m)   Body mass index is 31.71 kg/m.  General appearance: alert, no distress, WD/WN, female HEENT: normocephalic, sclerae anicteric, TMs pearly, nares patent, no discharge or erythema, pharynx normal Oral cavity: MMM, no lesions Neck: supple, no lymphadenopathy, no thyromegaly, no masses Heart: RRR, normal S1, S2, no murmurs Lungs: CTA bilaterally, no wheezes, rhonchi, or rales Abdomen: +bs, soft, non tender, non distended, no masses, no hepatomegaly, no splenomegaly Musculoskeletal: nontender, no swelling, no obvious deformity. Right shoulder pain with ROM. Extremities: no edema, no cyanosis, no clubbing Pulses: 2+ symmetric, upper and lower extremities, normal cap refill Neurological: alert, oriented x 3, CN2-12 intact, strength normal upper extremities and lower  extremities, sensation normal throughout, DTRs 2+ throughout, no cerebellar signs, gait normal Psychiatric: normal affect, behavior normal, pleasant   Desiree Anderson, Laqueta Jean, DNP [facility] 11/09/2020  12:21 PM

## 2020-11-10 LAB — CBC WITH DIFFERENTIAL/PLATELET
Absolute Monocytes: 627 cells/uL (ref 200–950)
Basophils Absolute: 45 cells/uL (ref 0–200)
Basophils Relative: 0.4 %
Eosinophils Absolute: 101 cells/uL (ref 15–500)
Eosinophils Relative: 0.9 %
HCT: 42.1 % (ref 35.0–45.0)
Hemoglobin: 14 g/dL (ref 11.7–15.5)
Lymphs Abs: 4334 cells/uL — ABNORMAL HIGH (ref 850–3900)
MCH: 27.9 pg (ref 27.0–33.0)
MCHC: 33.3 g/dL (ref 32.0–36.0)
MCV: 83.9 fL (ref 80.0–100.0)
MPV: 9.8 fL (ref 7.5–12.5)
Monocytes Relative: 5.6 %
Neutro Abs: 6093 cells/uL (ref 1500–7800)
Neutrophils Relative %: 54.4 %
Platelets: 284 10*3/uL (ref 140–400)
RBC: 5.02 10*6/uL (ref 3.80–5.10)
RDW: 13.3 % (ref 11.0–15.0)
Total Lymphocyte: 38.7 %
WBC: 11.2 10*3/uL — ABNORMAL HIGH (ref 3.8–10.8)

## 2020-11-10 LAB — COMPLETE METABOLIC PANEL WITH GFR
AG Ratio: 2 (calc) (ref 1.0–2.5)
ALT: 37 U/L — ABNORMAL HIGH (ref 6–29)
AST: 65 U/L — ABNORMAL HIGH (ref 10–35)
Albumin: 4.1 g/dL (ref 3.6–5.1)
Alkaline phosphatase (APISO): 59 U/L (ref 37–153)
BUN: 12 mg/dL (ref 7–25)
CO2: 32 mmol/L (ref 20–32)
Calcium: 9.2 mg/dL (ref 8.6–10.4)
Chloride: 101 mmol/L (ref 98–110)
Creat: 0.59 mg/dL (ref 0.50–0.99)
GFR, Est African American: 114 mL/min/{1.73_m2} (ref >=60)
GFR, Est Non African American: 98 mL/min/{1.73_m2} (ref >=60)
Globulin: 2.1 g/dL (calc) (ref 1.9–3.7)
Glucose, Bld: 90 mg/dL (ref 65–99)
Potassium: 4.8 mmol/L (ref 3.5–5.3)
Sodium: 141 mmol/L (ref 135–146)
Total Bilirubin: 0.4 mg/dL (ref 0.2–1.2)
Total Protein: 6.2 g/dL (ref 6.1–8.1)

## 2020-11-10 LAB — TSH: TSH: 9.73 mIU/L — ABNORMAL HIGH (ref 0.40–4.50)

## 2020-11-15 NOTE — Progress Notes (Signed)
PATIENT IS AWARE OF LAB RESULTS AND INSTRUCTIONS. -E WELCH

## 2020-11-17 ENCOUNTER — Other Ambulatory Visit: Payer: Self-pay | Admitting: Internal Medicine

## 2020-11-17 DIAGNOSIS — E039 Hypothyroidism, unspecified: Secondary | ICD-10-CM

## 2020-11-17 DIAGNOSIS — M797 Fibromyalgia: Secondary | ICD-10-CM

## 2020-11-17 MED ORDER — LEVOTHYROXINE SODIUM 100 MCG PO TABS: ORAL_TABLET | ORAL | 0 refills | Status: DC

## 2020-12-02 ENCOUNTER — Other Ambulatory Visit: Payer: Self-pay | Admitting: Internal Medicine

## 2020-12-02 ENCOUNTER — Other Ambulatory Visit: Payer: Self-pay | Admitting: Adult Health

## 2020-12-02 DIAGNOSIS — F419 Anxiety disorder, unspecified: Secondary | ICD-10-CM

## 2020-12-02 MED ORDER — ALPRAZOLAM 0.5 MG PO TABS
ORAL_TABLET | ORAL | 0 refills | Status: DC
Start: 2020-12-02 — End: 2021-04-26

## 2020-12-08 ENCOUNTER — Other Ambulatory Visit: Payer: Self-pay | Admitting: Internal Medicine

## 2020-12-20 ENCOUNTER — Other Ambulatory Visit: Payer: Self-pay | Admitting: Oncology

## 2020-12-28 DIAGNOSIS — R82998 Other abnormal findings in urine: Secondary | ICD-10-CM | POA: Diagnosis not present

## 2020-12-28 DIAGNOSIS — Z01419 Encounter for gynecological examination (general) (routine) without abnormal findings: Secondary | ICD-10-CM | POA: Diagnosis not present

## 2020-12-28 DIAGNOSIS — N390 Urinary tract infection, site not specified: Secondary | ICD-10-CM | POA: Diagnosis not present

## 2021-01-11 ENCOUNTER — Other Ambulatory Visit: Payer: Self-pay | Admitting: Internal Medicine

## 2021-02-01 ENCOUNTER — Encounter: Payer: Self-pay | Admitting: Internal Medicine

## 2021-02-01 NOTE — Patient Instructions (Signed)

## 2021-02-01 NOTE — Progress Notes (Signed)
Annual Screening/Preventative Visit & Comprehensive Evaluation &  Examination      This very nice 65 y.o. MWF presents for a Screening /Preventative Visit & comprehensive evaluation and management of multiple medical co-morbidities.  Patient has been followed for HTN, HLD, Prediabetes  and Vitamin D Deficiency. Patient has GERD controlled on her meds.                                         Patient has been on SS Disability since 2005 for Fibromyalgiaand Chronic Pain Syndromefrom DDD/DJD and Interstitial Cystitis. She has since has tapered off of her Chronic Opioids.       HTN predates circa 2004. Patient's BP has been controlled at home and patient denies any cardiac symptoms as chest pain, palpitations, shortness of breath, dizziness or ankle swelling. Today's BP is at goal - 126/82.       Patient's hyperlipidemia is controlled with diet and Rosuvastatin /Ezetimibe. Patient denies myalgias or other medication SE's. Last lipids were at goal:  Lab Results  Component Value Date   CHOL 125 08/09/2020   HDL 63 08/09/2020   LDLCALC 46 08/09/2020   TRIG 75 08/09/2020   CHOLHDL 2.0 08/09/2020       Patient has been on Thyroid Replacement since she was dx'd Hypothyroid in 2002.       Patient has hx/o prediabetes (A1c 6.1% /2011) and patient denies reactive hypoglycemic symptoms, visual blurring, diabetic polys or paresthesias. Last A1c was not at goal:  Lab Results  Component Value Date   HGBA1C 5.9 (H) 08/09/2020       Finally, patient has history of Vitamin D Deficiency ("11" /2008) and last Vitamin D was at goal:  Lab Results  Component Value Date   VD25OH 95 08/09/2020    Current Outpatient Medications on File Prior to Visit  Medication Sig  . ALPRAZolam  0.5 MG tablet Take     1/2 - 1 tablet 2 - 3 x /day ONLY if needed   . VITAMIN C 1000 MG tablet Take  daily.  . cetirizine  10 MG tablet Take  daily.  Marland Kitchen VITAMIN D 5000 UNITS TABS Take  2  times daily.   .  cholestyramine  4 g packet times daily.  Marland Kitchen ezetimibe 10 MG tablet Take 1 tablet daily for Cholesterol  . famotidine  40 MG tablet Take 1 tablet ( as needed.  Marland Kitchen FLONASE nasal spray Use 2 sprays each nostril Daily as needed  . gabapentin  600 MG tablet Take 1/2 -1 tab   2 - 3 x/day as needed for pain   . hyoscyamine SL 0.125 MG SL tablet Dissolve 1 tab under tongue every 6 hrs as needed for Nausea or cramping  . ketoconazole 2 % cream Apply Daily to Ringworm Skin  Infection  . levothyroxine 100 MCG tablet Take 1 tablet Daily   . Multiple Vitamin Geritabs Take 1 tablet  daily  . potassium chloride  20 MEQ tablet Take  1 tablet   2 x /day  for Potassium  . rosuvastatin  20 MG tablet Take 1 tablet Daily   . tamoxifen 20 MG tablet Take 1 tablet daily  . venlafaxine-XR 37.5 MG 2 TAKE 1 CAPSULE DAILY   . zinc 50 MG tablet Take  daily.    Allergies  Allergen Reactions  . Iodine   . Lipitor [Atorvastatin] Other (See  Comments)    Muscle and legs aches  . Shellfish Allergy Diarrhea and Nausea And Vomiting  . Betadine [Povidone Iodine] Rash    Past Medical History:  Diagnosis Date  . Anxiety   . Breast cancer ([place])   . Complication of anesthesia    hypotension  . DDD (degenerative disc disease), lumbar   . Erosion of vaginal mesh ([place])   . Fibromyalgia   . GERD (gastroesophageal reflux disease)   . History of gastric ulcer   . History of radiation therapy 07/06/16- 08/22/16   Right Breast  . Hypertension   . Hypothyroidism   . Personal history of radiation therapy   . Rectocele     Health Maintenance  Topic Date Due  . Hepatitis C Screening  Never done  . HIV Screening  Never done  . PAP SMEAR-Modifier  04/11/2019  . Fecal DNA (Cologuard)  03/05/2020  . COVID-19 Vaccine (3 - Pfizer risk 4-dose series) 04/09/2020  . TETANUS/TDAP  03/15/2021  . MAMMOGRAM  06/23/2022  . INFLUENZA VACCINE  Completed  . HPV VACCINES  Aged Out    Immunization History  Administered Date(s)  Administered  . Influenza Inj Mdck Quad With Preservative 09/04/2017, 09/30/2018, 09/15/2020  . Influenza Split 09/18/2013, 09/25/2014, 10/21/2019  . Influenza, Seasonal, Injecte, Preservative Fre 10/11/2015  . Influenza,inj,quad, With Preservative 08/28/2016, 08/27/2017  . Influenza-Unspecified 08/27/2016  . PFIZER(Purple Top)SARS-COV-2 Vaccination 02/20/2020, 03/12/2020  . Pneumococcal-Unspecified 11/27/2000  . Tdap 03/16/2011   Cologard 03/05/2017 - Negative  Colon - 02.02.2021 - Dr Loletha Carrow - recc 10 yr f/u - due Feb 2031  Last MGM - 06/23/2020  Past Surgical History:  Procedure Laterality Date  . BLADDER TACK  2003  . BREAST BIOPSY Right 04/18/2016  . BREAST LUMPECTOMY Right 05/18/2016  . Breast mass removal Left 1985   Benign  . STRESS TEST-  Negative -DR  Kirk Ruths   [facility] 07-02-2013       LOW RISK NUCLEAR STUDY WITH A SMALL, MILD APICAL SEPTAL FIXED PERFUSION DEFECT .  GIVEN NORMAL WALL FUNCTION , SUSPECT REPRESENTS ATTENUATION/  EF 74%/  NO ISCHEMIA  . CE w/ IOL  Left 01/2014   CE w/ IOL Right 12/2017   Dr. Loney Hering;  Rolm Bookbinder, MD  - 12/10/2017  . CYSTO/  HYDRODISTENTION/  BLADDER BX/  08-29-2010  . EXCISION OF MESH N/A 03/23/2014   EXCISION OF VAGINAL AND PERI-URETHRAL MESH, EXTRACTION FROM URETHRA TO APEX,  Cephus Slater, MD;   . EXCISION VAGINAL CYST N/A 04/06/2014   Procedure: VAGINAL SPECULUM EXAMINATION/POSSIBLE DRAINAGE OF HEMATOMA;  Surgeon: Ailene Rud, MD;  Location: [facility];  Service: Urology;  Laterality: N/A;  . INCONTINENCE SURGERY  2009  &  2005  . RADIOACTIVE SEED GUIDED PARTIAL MASTECTOMY WITH AXILLARY SENTINEL LYMPH NODE BIOPSY Right 05/18/2016   Procedure: RADIOACTIVE SEED GUIDED RIGHT BREAST LUMPECTOMY WITH AXILLARY SENTINEL LYMPH NODE BIOPSY;  Surgeon: Rolm Bookbinder, MD;  Location: [place];  Service: General;  Laterality: Right;  RADIOACTIVE SEED GUIDED RIGHT BREAST  LUMPECTOMY WITH AXILLARY SENTINEL LYMPH NODE BIOPSY  . TRANSTHORACIC ECHOCARDIOGRAM  06-16-2013   GRADE I DIASTOLIC DYSFUNCTION/  EF 55-60%/  MILD AR/  TRIVIAL MR  &  TR  . VAGINAL HYSTERECTOMY  2000   w/  unilateral salpingoophorectomy  . VEIN BYPASS SURGERY  AGE 68   RIGHT LEG    Family History  Problem Relation Age of Onset  . Diabetes Mother   . Diverticulitis  Mother   . Lung cancer Brother   . Intestinal polyp Sister   . Diverticulitis Sister     Social History   Tobacco Use  . Smoking status: Former Smoker    Years: 20.00    Types: Cigarettes    Quit date: 11/28/1995    Years since quitting: 25.1  . Smokeless tobacco: Current User    Types: Snuff  . Tobacco comment: dips daily  Vaping Use  . Vaping Use: Never used  Substance Use Topics  . Alcohol use: No  . Drug use: No    ROS Constitutional: Denies fever, chills, weight loss/gain, headaches, insomnia,  night sweats, and change in appetite. Does c/o fatigue. Eyes: Denies redness, blurred vision, diplopia, discharge, itchy, watery eyes.  ENT: Denies discharge, congestion, post nasal drip, epistaxis, sore throat, earache, hearing loss, dental pain, Tinnitus, Vertigo, Sinus pain, snoring.  Cardio: Denies chest pain, palpitations, irregular heartbeat, syncope, dyspnea, diaphoresis, orthopnea, PND, claudication, edema Respiratory: denies cough, dyspnea, DOE, pleurisy, hoarseness, laryngitis, wheezing.  Gastrointestinal: Denies dysphagia, heartburn, reflux, water brash, pain, cramps, nausea, vomiting, bloating, diarrhea, constipation, hematemesis, melena, hematochezia, jaundice, hemorrhoids Genitourinary: Denies dysuria, frequency, urgency, nocturia, hesitancy, discharge, hematuria, flank pain Breast: Breast lumps, nipple discharge, bleeding.  Musculoskeletal: Denies arthralgia, myalgia, stiffness, Jt. Swelling, pain, limp, and strain/sprain. Denies falls. Skin: Denies puritis, rash, hives, warts, acne, eczema, changing in  skin lesion Neuro: No weakness, tremor, incoordination, spasms, paresthesia, pain Psychiatric: Denies confusion, memory loss, sensory loss. Denies Depression. Endocrine: Denies change in weight, skin, hair change, nocturia, and paresthesia, diabetic polys, visual blurring, hyper / hypo glycemic episodes.  Heme/Lymph: No excessive bleeding, bruising, enlarged lymph nodes.  Physical Exam  BP 126/82   Pulse 79   Temp 97.7 F (36.5 C)   Resp 16   Ht 5\' 2"  (1.575 m)   Wt 181 lb 9.6 oz (82.4 kg)   SpO2 98%   BMI 33.22 kg/m   General Appearance: Over nourished and in no apparent distress.  Eyes: PERRLA, EOMs, conjunctiva no swelling or erythema, normal fundi and vessels. Sinuses: No frontal/maxillary tenderness ENT/Mouth: EACs patent / TMs  nl. Nares clear without erythema, swelling, mucoid exudates. Oral hygiene is good. No erythema, swelling, or exudate. Tongue normal, non-obstructing. Tonsils not swollen or erythematous. Hearing normal.  Neck: Supple, thyroid not palpable. No bruits, nodes or JVD. Respiratory: Respiratory effort normal.  BS equal and clear bilateral without rales, rhonci, wheezing or stridor. Cardio: Heart sounds are normal with regular rate and rhythm and no murmurs, rubs or gallops. Peripheral pulses are normal and equal bilaterally without edema. No aortic or femoral bruits. Chest: symmetric with normal excursions and percussion. Breasts: Symmetric, without lumps, nipple discharge, retractions, or fibrocystic changes.  Abdomen: Flat, soft with bowel sounds active. Nontender, no guarding, rebound, hernias, masses, or organomegaly.  Lymphatics: Non tender without lymphadenopathy.  Genitourinary:  Musculoskeletal: Full ROM all peripheral extremities, joint stability, 5/5 strength, and normal gait. Skin: Warm and dry without rashes, lesions, cyanosis, clubbing or  ecchymosis.  Neuro: Cranial nerves intact, reflexes equal bilaterally. Normal muscle tone, no cerebellar  symptoms. Sensation intact.  Pysch: Alert and oriented X 3, normal affect, Insight and Judgment appropriate.   Assessment and Plan  1. Annual Preventative Screening Examination   2. Essential hypertension  - EKG 12-Lead - Urinalysis, Routine w reflex microscopic - Microalbumin / creatinine urine ratio - CBC with Differential/Platelet - COMPLETE METABOLIC PANEL WITH GFR - Magnesium - TSH  3. Hyperlipidemia, mixed  - EKG 12-Lead -  Lipid panel - TSH  4. Abnormal glucose  - EKG 12-Lead - Hemoglobin A1c - Insulin, random  5. Vitamin D deficiency  - VITAMIN D 25 Hydroxy  6. Hypothyroidism  - TSH  7. Gastroesophageal reflux disease  - CBC with Differential/Platelet  8. Fibromyalgia Syndrome   9. Screening for colorectal cancer  - POC Hemoccult Bld/Stl   10. Screening for ischemic heart disease  - EKG 12-Lead  11. Former smoker  - EKG 12-Lead  12. Medication management  - Urinalysis, Routine w reflex microscopic - Microalbumin / creatinine urine ratio - CBC with Differential/Platelet - COMPLETE METABOLIC PANEL WITH GFR - Magnesium - Lipid panel - TSH - Hemoglobin A1c - Insulin, random - VITAMIN D 25 Hydroxy         Patient was counseled in prudent diet to achieve/maintain BMI less than 25 for weight control, BP monitoring, regular exercise and medications. Discussed med's effects and SE's. Screening labs and tests as requested with regular follow-up as recommended. Over 40 minutes of exam, counseling, chart review and high complex critical decision making was performed.   Kirtland Bouchard, MD

## 2021-02-02 ENCOUNTER — Ambulatory Visit (INDEPENDENT_AMBULATORY_CARE_PROVIDER_SITE_OTHER): Payer: Medicare Other | Admitting: Internal Medicine

## 2021-02-02 ENCOUNTER — Other Ambulatory Visit: Payer: Self-pay

## 2021-02-02 VITALS — BP 126/82 | HR 79 | Temp 97.7°F | Resp 16 | Ht 62.0 in | Wt 181.6 lb

## 2021-02-02 DIAGNOSIS — E669 Obesity, unspecified: Secondary | ICD-10-CM

## 2021-02-02 DIAGNOSIS — E559 Vitamin D deficiency, unspecified: Secondary | ICD-10-CM

## 2021-02-02 DIAGNOSIS — E039 Hypothyroidism, unspecified: Secondary | ICD-10-CM

## 2021-02-02 DIAGNOSIS — E782 Mixed hyperlipidemia: Secondary | ICD-10-CM | POA: Diagnosis not present

## 2021-02-02 DIAGNOSIS — Z136 Encounter for screening for cardiovascular disorders: Secondary | ICD-10-CM | POA: Diagnosis not present

## 2021-02-02 DIAGNOSIS — Z0000 Encounter for general adult medical examination without abnormal findings: Secondary | ICD-10-CM | POA: Diagnosis not present

## 2021-02-02 DIAGNOSIS — Z87891 Personal history of nicotine dependence: Secondary | ICD-10-CM

## 2021-02-02 DIAGNOSIS — I10 Essential (primary) hypertension: Secondary | ICD-10-CM | POA: Diagnosis not present

## 2021-02-02 DIAGNOSIS — E66811 Obesity, class 1: Secondary | ICD-10-CM

## 2021-02-02 DIAGNOSIS — R7309 Other abnormal glucose: Secondary | ICD-10-CM

## 2021-02-02 DIAGNOSIS — M797 Fibromyalgia: Secondary | ICD-10-CM

## 2021-02-02 DIAGNOSIS — K219 Gastro-esophageal reflux disease without esophagitis: Secondary | ICD-10-CM

## 2021-02-02 DIAGNOSIS — Z1211 Encounter for screening for malignant neoplasm of colon: Secondary | ICD-10-CM

## 2021-02-02 DIAGNOSIS — Z79899 Other long term (current) drug therapy: Secondary | ICD-10-CM

## 2021-02-02 DIAGNOSIS — Z0001 Encounter for general adult medical examination with abnormal findings: Secondary | ICD-10-CM

## 2021-02-02 MED ORDER — DEXAMETHASONE 4 MG PO TABS: ORAL_TABLET | ORAL | 1 refills | Status: DC

## 2021-02-02 MED ORDER — TOPIRAMATE 50 MG PO TABS: ORAL_TABLET | ORAL | 1 refills | Status: DC

## 2021-02-02 MED ORDER — ROSUVASTATIN CALCIUM 10 MG PO TABS: ORAL_TABLET | ORAL | 1 refills | Status: DC

## 2021-02-02 MED ORDER — PHENTERMINE HCL 37.5 MG PO TABS: ORAL_TABLET | ORAL | 1 refills | Status: DC

## 2021-02-02 NOTE — Addendum Note (Signed)
Addended by: Unk Pinto on: 02/02/2021 08:11 PM   Modules accepted: Orders

## 2021-02-03 LAB — URINALYSIS, ROUTINE W REFLEX MICROSCOPIC
Bilirubin Urine: NEGATIVE
Glucose, UA: NEGATIVE
Hgb urine dipstick: NEGATIVE
Ketones, ur: NEGATIVE
Leukocytes,Ua: NEGATIVE
Nitrite: NEGATIVE
Protein, ur: NEGATIVE
Specific Gravity, Urine: 1.005 (ref 1.001–1.03)
pH: 6 (ref 5.0–8.0)

## 2021-02-03 LAB — TSH: TSH: 3.65 mIU/L (ref 0.40–4.50)

## 2021-02-03 LAB — CBC WITH DIFFERENTIAL/PLATELET
Absolute Monocytes: 570 cells/uL (ref 200–950)
Basophils Absolute: 27 cells/uL (ref 0–200)
Basophils Relative: 0.3 %
Eosinophils Absolute: 107 cells/uL (ref 15–500)
Eosinophils Relative: 1.2 %
HCT: 37.1 % (ref 35.0–45.0)
Hemoglobin: 12.1 g/dL (ref 11.7–15.5)
Lymphs Abs: 4103 cells/uL — ABNORMAL HIGH (ref 850–3900)
MCH: 27.8 pg (ref 27.0–33.0)
MCHC: 32.6 g/dL (ref 32.0–36.0)
MCV: 85.1 fL (ref 80.0–100.0)
MPV: 10.3 fL (ref 7.5–12.5)
Monocytes Relative: 6.4 %
Neutro Abs: 4094 cells/uL (ref 1500–7800)
Neutrophils Relative %: 46 %
Platelets: 213 10*3/uL (ref 140–400)
RBC: 4.36 10*6/uL (ref 3.80–5.10)
RDW: 12.7 % (ref 11.0–15.0)
Total Lymphocyte: 46.1 %
WBC: 8.9 10*3/uL (ref 3.8–10.8)

## 2021-02-03 LAB — COMPLETE METABOLIC PANEL WITH GFR
AG Ratio: 2 (calc) (ref 1.0–2.5)
ALT: 47 U/L — ABNORMAL HIGH (ref 6–29)
AST: 62 U/L — ABNORMAL HIGH (ref 10–35)
Albumin: 3.6 g/dL (ref 3.6–5.1)
Alkaline phosphatase (APISO): 53 U/L (ref 37–153)
BUN: 7 mg/dL (ref 7–25)
CO2: 28 mmol/L (ref 20–32)
Calcium: 8.4 mg/dL — ABNORMAL LOW (ref 8.6–10.4)
Chloride: 107 mmol/L (ref 98–110)
Creat: 0.55 mg/dL (ref 0.50–0.99)
GFR, Est African American: 116 mL/min/{1.73_m2} (ref >=60)
GFR, Est Non African American: 100 mL/min/{1.73_m2} (ref >=60)
Globulin: 1.8 g/dL (calc) — ABNORMAL LOW (ref 1.9–3.7)
Glucose, Bld: 143 mg/dL — ABNORMAL HIGH (ref 65–99)
Potassium: 4.1 mmol/L (ref 3.5–5.3)
Sodium: 142 mmol/L (ref 135–146)
Total Bilirubin: 0.3 mg/dL (ref 0.2–1.2)
Total Protein: 5.4 g/dL — ABNORMAL LOW (ref 6.1–8.1)

## 2021-02-03 LAB — LIPID PANEL
Cholesterol: 108 mg/dL (ref <200)
HDL: 44 mg/dL — ABNORMAL LOW (ref >=50)
LDL Cholesterol (Calc): 39 mg/dL (calc)
Non-HDL Cholesterol (Calc): 64 mg/dL (calc) (ref <130)
Total CHOL/HDL Ratio: 2.5 (calc) (ref <5.0)
Triglycerides: 177 mg/dL — ABNORMAL HIGH (ref <150)

## 2021-02-03 LAB — MICROALBUMIN / CREATININE URINE RATIO
Creatinine, Urine: 30 mg/dL (ref 20–275)
Microalb, Ur: <0.2 mg/dL

## 2021-02-03 LAB — MAGNESIUM: Magnesium: 1.9 mg/dL (ref 1.5–2.5)

## 2021-02-03 LAB — VITAMIN D 25 HYDROXY (VIT D DEFICIENCY, FRACTURES): Vit D, 25-Hydroxy: 93 ng/mL (ref 30–100)

## 2021-02-03 LAB — HEMOGLOBIN A1C
Hgb A1c MFr Bld: 6 % of total Hgb — ABNORMAL HIGH (ref <5.7)
Mean Plasma Glucose: 126 mg/dL
eAG (mmol/L): 7 mmol/L

## 2021-02-03 LAB — INSULIN, RANDOM: Insulin: 234.8 u[IU]/mL — ABNORMAL HIGH

## 2021-02-03 NOTE — Progress Notes (Signed)
============================================================ ============================================================  -    Total Chol = 108  and LDL Chol = 39 -  Both  Excellent   - Very low risk for Heart Attack  / Stroke  - Recommend to cut your Crestor dose in 1/2   ============================================================ ============================================================  -  A1c = 6.0% - Still too high   Blood sugar and A1c are still elevated in the borderline and  early or pre-diabetes range which has the same   300% increased risk for heart attack, stroke, cancer and   alzheimer- type vascular dementia as full blown diabetes.   But the good news is that diet, exercise with  weight loss can cure the early diabetes at this point.  -  It is very important that you work harder with diet by  avoiding all foods that are white except chicken,   fish & calliflower.  - Avoid white rice  (brown & wild rice is OK),   - Avoid white potatoes  (sweet potatoes in moderation is OK),   White bread or wheat bread or anything made out of   white flour like bagels, donuts, rolls, buns, biscuits, cakes,  - pastries, cookies, pizza crust, and pasta (made from  white flour & egg whites)   - vegetarian pasta or spinach or wheat pasta is OK.  - Multigrain breads like Arnold's, Pepperidge Farm or   multigrain sandwich thins or high fiber breads like   Eureka bread or "Dave's Killer" breads that are  4 to 5 grams fiber per slice !  are best.    Diet, exercise and weight loss can reverse and cure  diabetes in the early stages.   ============================================================ ============================================================  -  Insulin level   = 234 .8    is very very high       (Normal is less than 20)   - and shows shows insulin resistance - a sign of early diabetes and also   associated with a 300 % greater risk for   heart attacks,  strokes, cancer & Alzheimer type vascular dementia   - All this can be cured  and prevented with losing weight   - get Dr Fara Olden Fuhrman's book 'the End of Diabetes" and   "the End of Dieting"  - and add many years of good health to your life. ============================================================ ============================================================  -  Vitamin D = 93  - Excellent   ! ============================================================ ============================================================  -  All Else - CBC - Kidneys - Electrolytes - Liver - Magnesium & Thyroid    - all  Normal / OK ============================================================  ============================================================

## 2021-02-11 ENCOUNTER — Other Ambulatory Visit: Payer: Self-pay | Admitting: Internal Medicine

## 2021-03-02 ENCOUNTER — Other Ambulatory Visit: Payer: Self-pay | Admitting: Internal Medicine

## 2021-03-17 ENCOUNTER — Other Ambulatory Visit: Payer: Self-pay | Admitting: Internal Medicine

## 2021-03-17 DIAGNOSIS — E039 Hypothyroidism, unspecified: Secondary | ICD-10-CM

## 2021-03-17 MED ORDER — LEVOTHYROXINE SODIUM 100 MCG PO TABS
ORAL_TABLET | ORAL | 1 refills | Status: DC
Start: 2021-03-17 — End: 2021-11-08

## 2021-03-27 ENCOUNTER — Other Ambulatory Visit: Payer: Self-pay | Admitting: Oncology

## 2021-03-29 ENCOUNTER — Ambulatory Visit: Payer: Self-pay

## 2021-03-29 ENCOUNTER — Encounter: Payer: Self-pay | Admitting: Orthopaedic Surgery

## 2021-03-29 ENCOUNTER — Other Ambulatory Visit: Payer: Self-pay

## 2021-03-29 ENCOUNTER — Ambulatory Visit (INDEPENDENT_AMBULATORY_CARE_PROVIDER_SITE_OTHER): Payer: Medicare Other | Admitting: Orthopaedic Surgery

## 2021-03-29 VITALS — BP 152/82 | HR 93 | Ht 62.0 in | Wt 181.0 lb

## 2021-03-29 DIAGNOSIS — M25512 Pain in left shoulder: Secondary | ICD-10-CM

## 2021-03-29 DIAGNOSIS — M542 Cervicalgia: Secondary | ICD-10-CM

## 2021-03-29 DIAGNOSIS — M4722 Other spondylosis with radiculopathy, cervical region: Secondary | ICD-10-CM | POA: Insufficient documentation

## 2021-03-29 DIAGNOSIS — G8929 Other chronic pain: Secondary | ICD-10-CM | POA: Diagnosis not present

## 2021-03-29 NOTE — Progress Notes (Signed)
Office Visit Note   Patient: Desiree Anderson           Date of Birth: August 13, 1956           MRN: [id number] Visit Date: 03/29/2021              Requested by: Unk Pinto, [address] [place],  [place] [address] PCP: Unk Pinto, MD   Assessment & Plan: Visit Diagnoses:  1. Chronic left shoulder pain   2. Neck pain   3. Other spondylosis with radiculopathy, cervical region     Plan: Patient's had progressive symptoms failed Tylenol abdominal gabapentin, dexamethasone, Biofreeze.  We will set up for some physical therapy.  She has home cervical traction at home, and the family owns and she can use as we discussed appropriate weight amount not exceeding 10 pounds.  We will check her back again in 5 weeks if she has not improved with therapy she will need to proceed with cervical MRI scan.  Follow-Up Instructions: Return in about 5 weeks (around 05/03/2021).   Orders:  Orders Placed This Encounter  Procedures  . XR Cervical Spine 2 or 3 views  . XR Shoulder Left  . Ambulatory referral to Physical Therapy   No orders of the defined types were placed in this encounter.     Procedures: No procedures performed   Clinical Data: No additional findings.   Subjective: Chief Complaint  Patient presents with  . Neck - Pain  . Left Shoulder - Pain    HPI 65 year old female presents with 55-month history of progressive left shoulder and neck pain.  She has had some pain for several years for the last 9 months has been gradually getting worse she has sharp pain when she tries to turn her neck pain radiates into both thumbs at times when she is lifting objects she will have pain in her neck which is sharp and she describes as knifelike and she has almost dropped the object she has.  Previous history of breast cancer on the right.  Patient is taken prednisone which made her face red and burned gave her some relief.  She has used Biofreeze.  Tylenol also  gabapentin.  She does have home cervical traction at home but has not been using it.  Patient states at times her pain is severe most of the time moderate to severe.  Positive history of hypertension hypothyroidism fibromyalgia lumbar disc degeneration noted.  Review of Systems all other systems noncontributory to HPI.   Objective: Vital Signs: BP (!) 152/82   Pulse 93   Ht 5\' 2"  (1.575 m)   Wt 181 lb (82.1 kg)   BMI 33.11 kg/m   Physical Exam Constitutional:      Appearance: She is well-developed.  HENT:     Head: Normocephalic.     Right Ear: External ear normal.     Left Ear: External ear normal.  Eyes:     Pupils: Pupils are equal, round, and reactive to light.  Neck:     Thyroid: No thyromegaly.     Trachea: No tracheal deviation.  Cardiovascular:     Rate and Rhythm: Normal rate.  Pulmonary:     Effort: Pulmonary effort is normal.  Abdominal:     Palpations: Abdomen is soft.  Skin:    General: Skin is warm and dry.  Neurological:     Mental Status: She is alert and oriented to person, place, and time.  Psychiatric:  Behavior: Behavior normal.     Ortho Exam patient has for percent rotation right left Spurling on the left brachial plexus tenderness moderate to severe on the left negative on the right.  Reflexes are 2+ and symmetrical.  Mild tenderness over the A1 pulley right and left thumb no triggering.  No thenar atrophy.  Normal heel toe gait.  Negative impingement right and left shoulder she has full range of motion of both shoulders.  Negative drop arm test.  Specialty Comments:  No specialty comments available.  Imaging: XR Cervical Spine 2 or 3 views  Result Date: 03/29/2021 AP lateral cervical spine x-rays are obtained and reviewed.  This shows 2 mm anterolisthesis at C4-5 and 30% disc space narrowing at C5-6 with facet and uncovertebral spurring at those levels. Impression: Mid cervical spondylosis C4-5 anterolisthesis and C5-6 spondylosis with  narrowing.  XR Shoulder Left  Result Date: 03/29/2021 3 views left shoulder obtained and reviewed.  Shows normal anatomy no soft tissue calcification.  Left shoulder glenohumeral and acromial clavicular joint is normal. Impression: Normal left shoulder radiographs.    PMFS History: Patient Active Problem List   Diagnosis Date Noted  . Other spondylosis with radiculopathy, cervical region 03/29/2021  . Insomnia 03/25/2018  . Situational anxiety 03/25/2018  . Encounter for Medicare annual wellness exam 02/19/2017  . Tobacco chew use 05/15/2016  . Malignant neoplasm of upper-outer quadrant of right breast in female, estrogen receptor positive ([place]) 04/21/2016  . Hypothyroidism 04/18/2016  . Obesity (BMI 30.0-34.9) 10/11/2015  . Medication management 09/24/2014  . Cystocele, midline 03/23/2014  . Essential hypertension 12/10/2013  . Mixed hyperlipidemia 12/10/2013  . Prediabetes 12/10/2013  . Vitamin D deficiency 12/10/2013  . DJD  12/10/2013  . DDD, lumbar 12/10/2013  . Fibromyalgia Syndrome 12/10/2013   Past Medical History:  Diagnosis Date  . Anxiety   . Breast cancer ([place])   . Complication of anesthesia    hypotension  . DDD (degenerative disc disease), lumbar   . Erosion of vaginal mesh ([place])   . Fibromyalgia   . GERD (gastroesophageal reflux disease)   . History of gastric ulcer   . History of radiation therapy 07/06/16- 08/22/16   Right Breast  . Hypertension   . Hypothyroidism   . Personal history of radiation therapy   . Rectocele     Family History  Problem Relation Age of Onset  . Diabetes Mother   . Diverticulitis Mother   . Lung cancer Brother   . Intestinal polyp Sister   . Diverticulitis Sister     Past Surgical History:  Procedure Laterality Date  . BLADDER TACK  2003  . BREAST BIOPSY Right 04/18/2016  . BREAST LUMPECTOMY Right 05/18/2016  . Breast mass removal Left 1985   Benign  . CARDIOVASCULAR STRESS TEST  07-02-2013  DR DALTON MCLEAN   LOW  RISK NUCLEAR STUDY WITH A SMALL, MILD APICAL SEPTAL FIXED PERFUSION DEFECT .  GIVEN NORMAL WALL FUNCTION , SUSPECT REPRESENTS ATTENUATION/  EF 74%/  NO ISCHEMIA  . CATARACT EXTRACTION W/ INTRAOCULAR LENS IMPLANT Left 01/2014  . CATARACT EXTRACTION W/ INTRAOCULAR LENS IMPLANT Right 12/2017   Dr. Manuella Ghazi  . CHOLECYSTECTOMY N/A 12/10/2017   Procedure: LAPAROSCOPIC CHOLECYSTECTOMY;  Surgeon: Rolm Bookbinder, MD;  Location: [facility];  Service: General;  Laterality: N/A;  . CYSTO/  HYDRODISTENTION/  BLADDER BX/   INSTILLATION THERAPY  08-29-2010  . EXCISION OF MESH N/A 03/23/2014   Procedure: EXCISION OF VAGINAL AND PERI-URETHRAL MESH, EXTRACTION FROM URETHRA TO APEX,  insertion of XENFORM in the vaginal vault;  Surgeon: Ailene Rud, MD;  Location: [facility];  Service: Urology;  Laterality: N/A;  . EXCISION VAGINAL CYST N/A 04/06/2014   Procedure: VAGINAL SPECULUM EXAMINATION/POSSIBLE DRAINAGE OF HEMATOMA;  Surgeon: Ailene Rud, MD;  Location: [facility];  Service: Urology;  Laterality: N/A;  . INCONTINENCE SURGERY  2009  &  2005  . RADIOACTIVE SEED GUIDED PARTIAL MASTECTOMY WITH AXILLARY SENTINEL LYMPH NODE BIOPSY Right 05/18/2016   Procedure: RADIOACTIVE SEED GUIDED RIGHT BREAST LUMPECTOMY WITH AXILLARY SENTINEL LYMPH NODE BIOPSY;  Surgeon: Rolm Bookbinder, MD;  Location: [facility];  Service: General;  Laterality: Right;  RADIOACTIVE SEED GUIDED RIGHT BREAST LUMPECTOMY WITH AXILLARY SENTINEL LYMPH NODE BIOPSY  . TRANSTHORACIC ECHOCARDIOGRAM  06-16-2013   GRADE I DIASTOLIC DYSFUNCTION/  EF 55-60%/  MILD AR/  TRIVIAL MR  &  TR  . VAGINAL HYSTERECTOMY  2000   w/  unilateral salpingoophorectomy  . VEIN BYPASS SURGERY  AGE 65   RIGHT LEG   Social History   Occupational History  . Not on file  Tobacco Use  . Smoking status: Former Smoker    Years: 20.00    Types: Cigarettes    Quit date: 11/28/1995    Years since quitting: 25.3  .  Smokeless tobacco: Current User    Types: Snuff  . Tobacco comment: dips daily  Vaping Use  . Vaping Use: Never used  Substance and Sexual Activity  . Alcohol use: No  . Drug use: No  . Sexual activity: Not on file

## 2021-04-08 ENCOUNTER — Encounter: Payer: Self-pay | Admitting: Rehabilitative and Restorative Service Providers"

## 2021-04-08 ENCOUNTER — Other Ambulatory Visit: Payer: Self-pay

## 2021-04-08 ENCOUNTER — Ambulatory Visit (INDEPENDENT_AMBULATORY_CARE_PROVIDER_SITE_OTHER): Payer: Medicare Other | Admitting: Rehabilitative and Restorative Service Providers"

## 2021-04-08 DIAGNOSIS — M25512 Pain in left shoulder: Secondary | ICD-10-CM

## 2021-04-08 DIAGNOSIS — M542 Cervicalgia: Secondary | ICD-10-CM

## 2021-04-08 DIAGNOSIS — M6281 Muscle weakness (generalized): Secondary | ICD-10-CM

## 2021-04-08 DIAGNOSIS — R293 Abnormal posture: Secondary | ICD-10-CM | POA: Diagnosis not present

## 2021-04-08 DIAGNOSIS — G8929 Other chronic pain: Secondary | ICD-10-CM

## 2021-04-08 NOTE — Patient Instructions (Signed)
Access Code: [email or web address] URL: [email or web address] Date: 04/08/2021 Prepared by: Scot Jun  Exercises Seated Scapular Retraction - 2 x daily - 7 x weekly - 2 sets - 10 reps - 5 hold Seated Upper Trapezius Stretch (Mirrored) - 2 x daily - 7 x weekly - 1 sets - 5 reps - 15 hold Supine Cervical Retraction with Towel - 2 x daily - 7 x weekly - 1 sets - 10 reps - 5 hold Gentle Levator Scapulae Stretch - 2 x daily - 7 x weekly - 1 sets - 5 reps - 15 hold

## 2021-04-08 NOTE — Therapy (Signed)
[facility] [address] [place], [place], [address] Phone: [phone]   Fax:  [phone]  Physical Therapy Evaluation  Patient Details  Name: Desiree Anderson MRN: [id number] Date of Birth: 07-Jan-1956 Referring Provider (PT): Dr. Lorin Mercy   Encounter Date: 04/08/2021   PT End of Session - 04/08/21 1337    Visit Number 1    Number of Visits 12    Date for PT Re-Evaluation 06/03/21    Authorization Type BCBS    Progress Note Due on Visit 10    PT Start Time 1346    PT Stop Time 1428    PT Time Calculation (min) 42 min    Activity Tolerance Patient tolerated treatment well    Behavior During Therapy [facility] for tasks assessed/performed           Past Medical History:  Diagnosis Date  . Anxiety   . Breast cancer ([place])   . Complication of anesthesia    hypotension  . DDD (degenerative disc disease), lumbar   . Erosion of vaginal mesh ([place])   . Fibromyalgia   . GERD (gastroesophageal reflux disease)   . History of gastric ulcer   . History of radiation therapy 07/06/16- 08/22/16   Right Breast  . Hypertension   . Hypothyroidism   . Personal history of radiation therapy   . Rectocele     Past Surgical History:  Procedure Laterality Date  . BLADDER TACK  2003  . BREAST BIOPSY Right 04/18/2016  . BREAST LUMPECTOMY Right 05/18/2016  . Breast mass removal Left 1985   Benign  . CARDIOVASCULAR STRESS TEST  07-02-2013  DR DALTON MCLEAN   LOW RISK NUCLEAR STUDY WITH A SMALL, MILD APICAL SEPTAL FIXED PERFUSION DEFECT .  GIVEN NORMAL WALL FUNCTION , SUSPECT REPRESENTS ATTENUATION/  EF 74%/  NO ISCHEMIA  . CATARACT EXTRACTION W/ INTRAOCULAR LENS IMPLANT Left 01/2014  . CATARACT EXTRACTION W/ INTRAOCULAR LENS IMPLANT Right 12/2017   Dr. Manuella Ghazi  . CHOLECYSTECTOMY N/A 12/10/2017   Procedure: LAPAROSCOPIC CHOLECYSTECTOMY;  Surgeon: Rolm Bookbinder, MD;  Location: [facility];  Service: General;  Laterality: N/A;  . CYSTO/  HYDRODISTENTION/  BLADDER BX/    INSTILLATION THERAPY  08-29-2010  . EXCISION OF MESH N/A 03/23/2014   Procedure: EXCISION OF VAGINAL AND PERI-URETHRAL MESH, EXTRACTION FROM URETHRA TO APEX, insertion of XENFORM in the vaginal vault;  Surgeon: Ailene Rud, MD;  Location: [facility];  Service: Urology;  Laterality: N/A;  . EXCISION VAGINAL CYST N/A 04/06/2014   Procedure: VAGINAL SPECULUM EXAMINATION/POSSIBLE DRAINAGE OF HEMATOMA;  Surgeon: Ailene Rud, MD;  Location: [facility];  Service: Urology;  Laterality: N/A;  . INCONTINENCE SURGERY  2009  &  2005  . RADIOACTIVE SEED GUIDED PARTIAL MASTECTOMY WITH AXILLARY SENTINEL LYMPH NODE BIOPSY Right 05/18/2016   Procedure: RADIOACTIVE SEED GUIDED RIGHT BREAST LUMPECTOMY WITH AXILLARY SENTINEL LYMPH NODE BIOPSY;  Surgeon: Rolm Bookbinder, MD;  Location: [place] [facility];  Service: General;  Laterality: Right;  RADIOACTIVE SEED GUIDED RIGHT BREAST LUMPECTOMY WITH AXILLARY SENTINEL LYMPH NODE BIOPSY  . TRANSTHORACIC ECHOCARDIOGRAM  06-16-2013   GRADE I DIASTOLIC DYSFUNCTION/  EF 55-60%/  MILD AR/  TRIVIAL MR  &  TR  . VAGINAL HYSTERECTOMY  2000   w/  unilateral salpingoophorectomy  . VEIN BYPASS SURGERY  AGE 30   RIGHT LEG    There were no vitals filed for this visit.    Subjective Assessment - 04/08/21 1345    Subjective  Pt. comes to clinic c complaints of neck pain and Lt shoulder/arm pain.   Pt. stated shoulder pain was initially worse but then neck pain and headaches started to begin and has been worsened more than Lt shoulder recently.  Pt. stated during the day she can get very tired and has to lie down and rest to help with symptoms/pressure.  Pt. stated insidious worsening of symptoms in last 3 months or so.    Pertinent History DDD, fibromyalgia, HTN, hypothryoidism, GERD, history of breast cancer    Limitations Lifting;Sitting;House hold activities    Diagnostic tests xrays (spondylosis diagnosis per referral)     Patient Stated Goals Reduce pain    Currently in Pain? Yes    Pain Score 8    pain at worst 10   Pain Location Neck    Pain Orientation Posterior;Upper;Mid;Lower    Pain Descriptors / Indicators Tiring;Aching;Sharp;Headache    Pain Type Chronic pain    Pain Radiating Towards Lt shoulder complaints (radicular or no?), headaches as well    Pain Onset More than a month ago    Pain Frequency Constant    Aggravating Factors  kitchen activity, cleaning, lifting/carrying    Pain Relieving Factors lying down with arm supported by pillows/resting,    Effect of Pain on Daily Activities Arts/crafts, cleaning/house activity , kitchen              [facility] PT Assessment - 04/08/21 0001      Assessment   Medical Diagnosis Cervical spondylosis    Referring Provider (PT) Dr. Lorin Mercy    Onset Date/Surgical Date 12/28/20    Hand Dominance Right      Precautions   Precautions None      Balance Screen   Has the patient fallen in the past 6 months No    Has the patient had a decrease in activity level because of a fear of falling?  No    Is the patient reluctant to leave their home because of a fear of falling?  No      Home Environment   Additional Comments grage is under house, 16-18 steps to enter from garage, 3 steps at front door      Prior Function   Leisure arts, crafts, housework, cleaning      Cognition   Overall Cognitive Status Within Functional Limits for tasks assessed      Observation/Other Assessments   Focus on Therapeutic Outcomes (FOTO)  intake 49%, predicted 53%      Sensation   Light Touch Appears Intact      Posture/Postural Control   Posture/Postural Control Postural limitations    Postural Limitations Rounded Shoulders;Forward head;Increased thoracic kyphosis      ROM / Strength   AROM / PROM / Strength Strength;PROM;AROM      AROM   Overall AROM Comments No specific limitations of Lt shoulder movement actively noted compared to Rt in sitting today    AROM  Assessment Site Cervical;Shoulder    Right/Left Shoulder Left;Right    Cervical Flexion 30   cervical pain indicated   Cervical Extension 48    Cervical - Right Rotation 65    Cervical - Left Rotation 35   Lt cervical pain     PROM   PROM Assessment Site Shoulder      Strength   Overall Strength Comments Lt shoulder concordant symptoms in lt flexion, abduction MMT    Strength Assessment Site Shoulder;Elbow    Right/Left Shoulder Left;Right  Right Shoulder Flexion 4+/5    Right Shoulder ABduction 5/5    Right Shoulder Internal Rotation 5/5    Right Shoulder External Rotation 4/5    Left Shoulder Flexion 4/5    Left Shoulder ABduction 4+/5    Left Shoulder Internal Rotation 5/5    Left Shoulder External Rotation 5/5    Right/Left Elbow Left;Right    Right Elbow Flexion 5/5    Right Elbow Extension 5/5    Left Elbow Flexion 5/5    Left Elbow Extension 5/5      Palpation   Palpation comment Trigger points c concordant symptoms in Lt upper trap, Lt infraspinatus                      Objective measurements completed on examination: See above findings.       [facility] Adult PT Treatment/Exercise - 04/08/21 0001      Exercises   Exercises Other Exercises    Other Exercises  HEP instruction/performance c cues for techniques, handout provided.  Trial set performed of each for comprehension and symptom assessment.  Consisting of seated upper trap stretch, levator stretch, scapular retraction, supine upper cervical flexion hold.      Manual Therapy   Manual therapy comments compression to Lt infraspinatus, Lt upper trap, soft tissue mobilization            Trigger Point Dry Needling - 04/08/21 0001    Consent Given? Yes    Education Handout Provided No   will give next visit   Muscles Treated Head and Neck Upper trapezius   Lt   Upper Trapezius Response Twitch reponse elicited                PT Education - 04/08/21 1343    Education Details HEP, POC     Person(s) Educated Patient    Methods Explanation;Verbal cues;Handout    Comprehension Verbalized understanding            PT Short Term Goals - 04/08/21 1337      PT SHORT TERM GOAL #1   Title Patient will demonstrate independent use of home exercise program to maintain progress from in clinic treatments.    Time 3    Period Weeks    Status New    Target Date 04/29/21             PT Long Term Goals - 04/08/21 1338      PT LONG TERM GOAL #1   Title Patient will demonstrate/report pain at worst less than or equal to 2/10 to facilitate minimal limitation in daily activity secondary to pain symptoms.    Time 8    Period Weeks    Status New    Target Date 06/03/21      PT LONG TERM GOAL #2   Title Patient will demonstrate independent use of home exercise program to facilitate ability to maintain/progress functional gains from skilled physical therapy services.    Time 8    Period Weeks    Status New    Target Date 06/03/21      PT LONG TERM GOAL #3   Title Pt. will demonstrate FOTO outcome > or = 53 to indicate reduced disability due to condition.    Time 8    Period Weeks    Status New    Target Date 06/03/21      PT LONG TERM GOAL #4   Title Pt. will demonstrate cervical AROM WFL s  symptoms to facilitate ability to perform usual head movement, driving at PLOF s limitation.    Time 8    Period Weeks    Status New    Target Date 06/03/21      PT LONG TERM GOAL #5   Title Pt. will demonstrate Lt shoulder MMT 5/5 throughout to facilitate usual lifting, carrying at PLOF s limitation.    Time 8    Period Weeks    Status New    Target Date 06/03/21                  Plan - 04/08/21 1339    Clinical Impression Statement Patient is a 65 y.o. who comes to clinic with complaints of cervical, Lt shoulder pain with mobility, strength and movement coordination deficits that impair their ability to perform usual daily and recreational functional activities without  increase difficulty/symptoms at this time.  Patient to benefit from skilled PT services to address impairments and limitations to improve to previous level of function without restriction secondary to condition.    Personal Factors and Comorbidities Comorbidity 3+    Comorbidities DDD, fibromyalgia, HTN, hypothryoidism, GERD, history of breast cancer    Examination-Activity Limitations Sit;Sleep;Bend;Carry;Dressing;Lift;Reach Overhead    Examination-Participation Restrictions Cleaning;Community Activity;Driving;Laundry;Meal Prep    Stability/Clinical Decision Making Stable/Uncomplicated    Clinical Decision Making Low    Rehab Potential Good    PT Frequency Other (comment)   1-2x/week   PT Duration 8 weeks    PT Treatment/Interventions ADLs/Self Care Home Management;Moist Heat;Traction;Spinal Manipulations;Joint Manipulations;Passive range of motion;Dry needling;Manual techniques;Therapeutic activities;Functional mobility training;Therapeutic exercise;DME Instruction;Cryotherapy    PT Next Visit Plan Review existing HEP.  Dry needling again?  Check for cervical distraction response/traction possible    PT Home Exercise Plan [id number] and Agree with Plan of Care Patient           Patient will benefit from skilled therapeutic intervention in order to improve the following deficits and impairments:  Hypomobility,Decreased endurance,Impaired UE functional use,Pain,Increased fascial restricitons,Increased muscle spasms,Decreased mobility,Decreased activity tolerance,Impaired perceived functional ability,Improper body mechanics,Impaired flexibility,Postural dysfunction,Decreased coordination,Decreased range of motion  Visit Diagnosis: Cervicalgia  Chronic left shoulder pain  Muscle weakness (generalized)  Abnormal posture     Problem List Patient Active Problem List   Diagnosis Date Noted  . Other spondylosis with radiculopathy, cervical region 03/29/2021  . Insomnia  03/25/2018  . Situational anxiety 03/25/2018  . Encounter for Medicare annual wellness exam 02/19/2017  . Tobacco chew use 05/15/2016  . Malignant neoplasm of upper-outer quadrant of right breast in female, estrogen receptor positive ([place]) 04/21/2016  . Hypothyroidism 04/18/2016  . Obesity (BMI 30.0-34.9) 10/11/2015  . Medication management 09/24/2014  . Cystocele, midline 03/23/2014  . Essential hypertension 12/10/2013  . Mixed hyperlipidemia 12/10/2013  . Prediabetes 12/10/2013  . Vitamin D deficiency 12/10/2013  . DJD  12/10/2013  . DDD, lumbar 12/10/2013  . Fibromyalgia Syndrome 12/10/2013   Scot Jun, PT, DPT, OCS, ATC 04/08/21  2:34 PM    [facility] [address] [place], [place], [address] Phone: [phone]   Fax:  [phone]  Name: Desiree Anderson MRN: [id number] Date of Birth: 1955/12/24

## 2021-04-19 ENCOUNTER — Encounter: Payer: Self-pay | Admitting: Physical Therapy

## 2021-04-19 ENCOUNTER — Ambulatory Visit: Payer: Medicare Other | Admitting: Physical Therapy

## 2021-04-19 ENCOUNTER — Other Ambulatory Visit: Payer: Self-pay

## 2021-04-19 DIAGNOSIS — M6281 Muscle weakness (generalized): Secondary | ICD-10-CM | POA: Diagnosis not present

## 2021-04-19 DIAGNOSIS — M542 Cervicalgia: Secondary | ICD-10-CM

## 2021-04-19 DIAGNOSIS — M25512 Pain in left shoulder: Secondary | ICD-10-CM | POA: Diagnosis not present

## 2021-04-19 DIAGNOSIS — R293 Abnormal posture: Secondary | ICD-10-CM | POA: Diagnosis not present

## 2021-04-19 DIAGNOSIS — G8929 Other chronic pain: Secondary | ICD-10-CM

## 2021-04-19 NOTE — Therapy (Signed)
[facility] [address] [place], [place], [address] Phone: [phone]   Fax:  [phone]  Physical Therapy Treatment  Patient Details  Name: Desiree Anderson MRN: [id number] Date of Birth: 1956-02-26 Referring Provider (PT): Dr. Lorin Mercy   Encounter Date: 04/19/2021   PT End of Session - 04/19/21 1009    Visit Number 2    Number of Visits 12    Date for PT Re-Evaluation 06/03/21    Authorization Type BCBS    Progress Note Due on Visit 10    PT Start Time [phone]    PT Stop Time 0940    PT Time Calculation (min) 53 min    Activity Tolerance Patient tolerated treatment well    Behavior During Therapy [facility] for tasks assessed/performed           Past Medical History:  Diagnosis Date  . Anxiety   . Breast cancer ([place])   . Complication of anesthesia    hypotension  . DDD (degenerative disc disease), lumbar   . Erosion of vaginal mesh ([place])   . Fibromyalgia   . GERD (gastroesophageal reflux disease)   . History of gastric ulcer   . History of radiation therapy 07/06/16- 08/22/16   Right Breast  . Hypertension   . Hypothyroidism   . Personal history of radiation therapy   . Rectocele     Past Surgical History:  Procedure Laterality Date  . BLADDER TACK  2003  . BREAST BIOPSY Right 04/18/2016  . BREAST LUMPECTOMY Right 05/18/2016  . Breast mass removal Left 1985   Benign  . CARDIOVASCULAR STRESS TEST  07-02-2013  DR DALTON MCLEAN   LOW RISK NUCLEAR STUDY WITH A SMALL, MILD APICAL SEPTAL FIXED PERFUSION DEFECT .  GIVEN NORMAL WALL FUNCTION , SUSPECT REPRESENTS ATTENUATION/  EF 74%/  NO ISCHEMIA  . CATARACT EXTRACTION W/ INTRAOCULAR LENS IMPLANT Left 01/2014  . CATARACT EXTRACTION W/ INTRAOCULAR LENS IMPLANT Right 12/2017   Dr. Manuella Ghazi  . CHOLECYSTECTOMY N/A 12/10/2017   Procedure: LAPAROSCOPIC CHOLECYSTECTOMY;  Surgeon: Rolm Bookbinder, MD;  Location: [place];  Service: General;  Laterality: N/A;  . CYSTO/  HYDRODISTENTION/  BLADDER BX/    INSTILLATION THERAPY  08-29-2010  . EXCISION OF MESH N/A 03/23/2014   Procedure: EXCISION OF VAGINAL AND PERI-URETHRAL MESH, EXTRACTION FROM URETHRA TO APEX, insertion of XENFORM in the vaginal vault;  Surgeon: Ailene Rud, MD;  Location: [facility];  Service: Urology;  Laterality: N/A;  . EXCISION VAGINAL CYST N/A 04/06/2014   Procedure: VAGINAL SPECULUM EXAMINATION/POSSIBLE DRAINAGE OF HEMATOMA;  Surgeon: Ailene Rud, MD;  Location: [facility];  Service: Urology;  Laterality: N/A;  . INCONTINENCE SURGERY  2009  &  2005  . RADIOACTIVE SEED GUIDED PARTIAL MASTECTOMY WITH AXILLARY SENTINEL LYMPH NODE BIOPSY Right 05/18/2016   Procedure: RADIOACTIVE SEED GUIDED RIGHT BREAST LUMPECTOMY WITH AXILLARY SENTINEL LYMPH NODE BIOPSY;  Surgeon: Rolm Bookbinder, MD;  Location: [place];  Service: General;  Laterality: Right;  RADIOACTIVE SEED GUIDED RIGHT BREAST LUMPECTOMY WITH AXILLARY SENTINEL LYMPH NODE BIOPSY  . TRANSTHORACIC ECHOCARDIOGRAM  06-16-2013   GRADE I DIASTOLIC DYSFUNCTION/  EF 55-60%/  MILD AR/  TRIVIAL MR  &  TR  . VAGINAL HYSTERECTOMY  2000   w/  unilateral salpingoophorectomy  . VEIN BYPASS SURGERY  AGE 50   RIGHT LEG    There were no vitals filed for this visit.   Subjective Assessment - 04/19/21 0854    Subjective DN  was helpful for about 4-5 days then pain started returning, noticing some pain on Rt side as well.  Exercises are going well; some pain with a few of them.    Pertinent History DDD, fibromyalgia, HTN, hypothryoidism, GERD, history of breast cancer    Limitations Lifting;Sitting;House hold activities    Diagnostic tests xrays (spondylosis diagnosis per referral)    Patient Stated Goals Reduce pain    Currently in Pain? Yes    Pain Score 8     Pain Location Neck    Pain Orientation Right;Left;Posterior;Mid;Upper    Pain Descriptors / Indicators Aching;Sharp;Headache;Tiring;Tightness    Pain Type Chronic  pain    Pain Onset More than a month ago    Pain Frequency Constant    Aggravating Factors  kitchen activity, cleaning, lifting/carrying    Pain Relieving Factors lying down with arm supported by pillows                             [facility] Adult PT Treatment/Exercise - 04/19/21 0859      Exercises   Exercises Neck      Neck Exercises: Machines for Strengthening   UBE (Upper Arm Bike) L2 x 6 min (3' each direction)      Neck Exercises: Seated   Other Seated Exercise scapular retraction 2 sets; 10 reps x 5 sec hol      Neck Exercises: Supine   Neck Retraction 10 reps;5 secs      Modalities   Modalities Traction      Traction   Type of Traction Cervical    Min (lbs) 10    Max (lbs) 15    Hold Time 60    Rest Time 20    Time 12      Manual Therapy   Manual therapy comments compression to Lt levator scapula, Lt upper trap, soft tissue mobilization      Neck Exercises: Stretches   Upper Trapezius Stretch Right;Left;3 reps;30 seconds    Levator Stretch Right;Left;2 reps;30 seconds            Trigger Point Dry Needling - 04/19/21 0927    Consent Given? Yes    Education Handout Provided Yes    Muscles Treated Head and Neck Upper trapezius;Levator scapulae    Upper Trapezius Response Twitch reponse elicited    Levator Scapulae Response Twitch response elicited                PT Education - 04/19/21 1008    Education Details DN    Person(s) Educated Patient    Methods Explanation;Handout    Comprehension Verbalized understanding            PT Short Term Goals - 04/19/21 1009      PT SHORT TERM GOAL #1   Title Patient will demonstrate independent use of home exercise program to maintain progress from in clinic treatments.    Time 3    Period Weeks    Status On-going    Target Date 04/29/21             PT Long Term Goals - 04/08/21 1338      PT LONG TERM GOAL #1   Title Patient will demonstrate/report pain at worst less than or  equal to 2/10 to facilitate minimal limitation in daily activity secondary to pain symptoms.    Time 8    Period Weeks    Status New    Target  Date 06/03/21      PT LONG TERM GOAL #2   Title Patient will demonstrate independent use of home exercise program to facilitate ability to maintain/progress functional gains from skilled physical therapy services.    Time 8    Period Weeks    Status New    Target Date 06/03/21      PT LONG TERM GOAL #3   Title Pt. will demonstrate FOTO outcome > or = 53 to indicate reduced disability due to condition.    Time 8    Period Weeks    Status New    Target Date 06/03/21      PT LONG TERM GOAL #4   Title Pt. will demonstrate cervical AROM WFL s symptoms to facilitate ability to perform usual head movement, driving at PLOF s limitation.    Time 8    Period Weeks    Status New    Target Date 06/03/21      PT LONG TERM GOAL #5   Title Pt. will demonstrate Lt shoulder MMT 5/5 throughout to facilitate usual lifting, carrying at PLOF s limitation.    Time 8    Period Weeks    Status New    Target Date 06/03/21                 Plan - 04/19/21 1009    Clinical Impression Statement Pt needed min cues for HEP review today and tolerated DN to UT and LS well with positive response.  Trial of traction today and will assess response at next visit.  All goals ongoing at this time.    Personal Factors and Comorbidities Comorbidity 3+    Comorbidities DDD, fibromyalgia, HTN, hypothryoidism, GERD, history of breast cancer    Examination-Activity Limitations Sit;Sleep;Bend;Carry;Dressing;Lift;Reach Overhead    Examination-Participation Restrictions Cleaning;Community Activity;Driving;Laundry;Meal Prep    Stability/Clinical Decision Making Stable/Uncomplicated    Rehab Potential Good    PT Frequency Other (comment)   1-2x/week   PT Duration 8 weeks    PT Treatment/Interventions ADLs/Self Care Home Management;Moist Heat;Traction;Spinal  Manipulations;Joint Manipulations;Passive range of motion;Dry needling;Manual techniques;Therapeutic activities;Functional mobility training;Therapeutic exercise;DME Instruction;Cryotherapy    PT Next Visit Plan Review existing HEP. assess response to DN/repeat PRN, assess response to traction    PT Home Exercise Plan [email or web address]    Consulted and Agree with Plan of Care Patient           Patient will benefit from skilled therapeutic intervention in order to improve the following deficits and impairments:  Hypomobility,Decreased endurance,Impaired UE functional use,Pain,Increased fascial restricitons,Increased muscle spasms,Decreased mobility,Decreased activity tolerance,Impaired perceived functional ability,Improper body mechanics,Impaired flexibility,Postural dysfunction,Decreased coordination,Decreased range of motion  Visit Diagnosis: Cervicalgia  Chronic left shoulder pain  Muscle weakness (generalized)  Abnormal posture     Problem List Patient Active Problem List   Diagnosis Date Noted  . Other spondylosis with radiculopathy, cervical region 03/29/2021  . Insomnia 03/25/2018  . Situational anxiety 03/25/2018  . Encounter for Medicare annual wellness exam 02/19/2017  . Tobacco chew use 05/15/2016  . Malignant neoplasm of upper-outer quadrant of right breast in female, estrogen receptor positive ([place]) 04/21/2016  . Hypothyroidism 04/18/2016  . Obesity (BMI 30.0-34.9) 10/11/2015  . Medication management 09/24/2014  . Cystocele, midline 03/23/2014  . Essential hypertension 12/10/2013  . Mixed hyperlipidemia 12/10/2013  . Prediabetes 12/10/2013  . Vitamin D deficiency 12/10/2013  . DJD  12/10/2013  . DDD, lumbar 12/10/2013  . Fibromyalgia Syndrome 12/10/2013      Laureen Abrahams, PT, DPT  04/19/21 10:11 AM    [facility] [address] [place], [place], [address] Phone: [phone]   Fax:  [phone]  Name: Desiree Anderson MRN: [id number] Date of Birth: May 29, 1956

## 2021-04-19 NOTE — Patient Instructions (Signed)

## 2021-04-26 ENCOUNTER — Other Ambulatory Visit: Payer: Self-pay | Admitting: Internal Medicine

## 2021-04-26 DIAGNOSIS — F419 Anxiety disorder, unspecified: Secondary | ICD-10-CM

## 2021-04-28 ENCOUNTER — Other Ambulatory Visit: Payer: Self-pay

## 2021-04-28 ENCOUNTER — Ambulatory Visit (INDEPENDENT_AMBULATORY_CARE_PROVIDER_SITE_OTHER): Payer: Medicare Other | Admitting: Rehabilitative and Restorative Service Providers"

## 2021-04-28 ENCOUNTER — Encounter: Payer: Self-pay | Admitting: Rehabilitative and Restorative Service Providers"

## 2021-04-28 DIAGNOSIS — M6281 Muscle weakness (generalized): Secondary | ICD-10-CM | POA: Diagnosis not present

## 2021-04-28 DIAGNOSIS — R293 Abnormal posture: Secondary | ICD-10-CM

## 2021-04-28 DIAGNOSIS — M542 Cervicalgia: Secondary | ICD-10-CM | POA: Diagnosis not present

## 2021-04-28 DIAGNOSIS — M25512 Pain in left shoulder: Secondary | ICD-10-CM | POA: Diagnosis not present

## 2021-04-28 DIAGNOSIS — G8929 Other chronic pain: Secondary | ICD-10-CM

## 2021-04-28 NOTE — Therapy (Signed)
[address], [place], [address] Phone: [phone]   Fax:  [phone]  Physical Therapy Treatment  Patient Details  Name: Desiree Anderson MRN: [id number] Date of Birth: 1956/09/06 Referring Provider (PT): Dr. Lorin Mercy   Encounter Date: 04/28/2021   PT End of Session - 04/28/21 1430    Visit Number 3    Number of Visits 12    Date for PT Re-Evaluation 06/03/21    Authorization Type BCBS    Progress Note Due on Visit 10    PT Start Time 1430    PT Stop Time 1509    PT Time Calculation (min) 39 min    Activity Tolerance Patient tolerated treatment well    Behavior During Therapy [facility] for tasks assessed/performed           Past Medical History:  Diagnosis Date  . Anxiety   . Breast cancer ([place])   . Complication of anesthesia    hypotension  . DDD (degenerative disc disease), lumbar   . Erosion of vaginal mesh ([place])   . Fibromyalgia   . GERD (gastroesophageal reflux disease)   . History of gastric ulcer   . History of radiation therapy 07/06/16- 08/22/16   Right Breast  . Hypertension   . Hypothyroidism   . Personal history of radiation therapy   . Rectocele     Past Surgical History:  Procedure Laterality Date  . BLADDER TACK  2003  . BREAST BIOPSY Right 04/18/2016  . BREAST LUMPECTOMY Right 05/18/2016  . Breast mass removal Left 1985   Benign  . CARDIOVASCULAR STRESS TEST  07-02-2013  DR DALTON MCLEAN   LOW RISK NUCLEAR STUDY WITH A SMALL, MILD APICAL SEPTAL FIXED PERFUSION DEFECT .  GIVEN NORMAL WALL FUNCTION , SUSPECT REPRESENTS ATTENUATION/  EF 74%/  NO ISCHEMIA  . CATARACT EXTRACTION W/ INTRAOCULAR LENS IMPLANT Left 01/2014  . CATARACT EXTRACTION W/ INTRAOCULAR LENS IMPLANT Right 12/2017   Dr. Manuella Ghazi  . CHOLECYSTECTOMY N/A 12/10/2017   Procedure: LAPAROSCOPIC CHOLECYSTECTOMY;  Surgeon: Rolm Bookbinder, MD;  Location: [facility];  Service: General;  Laterality: N/A;  . CYSTO/  HYDRODISTENTION/  BLADDER BX/    INSTILLATION THERAPY  08-29-2010  . EXCISION OF MESH N/A 03/23/2014   Procedure: EXCISION OF VAGINAL AND PERI-URETHRAL MESH, EXTRACTION FROM URETHRA TO APEX, insertion of XENFORM in the vaginal vault;  Surgeon: Ailene Rud, MD;  Location: [facility];  Service: Urology;  Laterality: N/A;  . EXCISION VAGINAL CYST N/A 04/06/2014   Procedure: VAGINAL SPECULUM EXAMINATION/POSSIBLE DRAINAGE OF HEMATOMA;  Surgeon: Ailene Rud, MD;  Location: [facility];  Service: Urology;  Laterality: N/A;  . INCONTINENCE SURGERY  2009  &  2005  . RADIOACTIVE SEED GUIDED PARTIAL MASTECTOMY WITH AXILLARY SENTINEL LYMPH NODE BIOPSY Right 05/18/2016   Procedure: RADIOACTIVE SEED GUIDED RIGHT BREAST LUMPECTOMY WITH AXILLARY SENTINEL LYMPH NODE BIOPSY;  Surgeon: Rolm Bookbinder, MD;  Location: [place];  Service: General;  Laterality: Right;  RADIOACTIVE SEED GUIDED RIGHT BREAST LUMPECTOMY WITH AXILLARY SENTINEL LYMPH NODE BIOPSY  . TRANSTHORACIC ECHOCARDIOGRAM  06-16-2013   GRADE I DIASTOLIC DYSFUNCTION/  EF 55-60%/  MILD AR/  TRIVIAL MR  &  TR  . VAGINAL HYSTERECTOMY  2000   w/  unilateral salpingoophorectomy  . VEIN BYPASS SURGERY  AGE 54   RIGHT LEG    There were no vitals filed for this visit.   Subjective Assessment - 04/28/21 1433    Subjective Pt.  indicated feeling sore and pain increase for first day or so after last visit.  Pt. indicated the needling/manual treatment wasn't like the first time and was more sore.  Pt. stated taking medicine to help symptoms.  Pt. indicated arm still saying better.    Pertinent History DDD, fibromyalgia, HTN, hypothryoidism, GERD, history of breast cancer    Limitations Lifting;Sitting;House hold activities    Diagnostic tests xrays (spondylosis diagnosis per referral)    Patient Stated Goals Reduce pain    Currently in Pain? Yes    Pain Score 7     Pain Location Neck    Pain Orientation Left;Mid;Lower;Posterior     Pain Descriptors / Indicators Aching;Sharp;Shooting    Pain Type Chronic pain    Pain Onset More than a month ago    Pain Frequency Constant    Aggravating Factors  constant pain, aggravating symptoms since last visit    Pain Relieving Factors medicine              [facility] PT Assessment - 04/28/21 0001      Assessment   Medical Diagnosis Cervical spondylosis    Referring Provider (PT) Dr. Lorin Mercy    Onset Date/Surgical Date 12/28/20    Hand Dominance Right      AROM   Cervical Flexion 54    Cervical Extension 48    Cervical - Right Rotation 71    Cervical - Left Rotation 48   pain noted                        [facility] Adult PT Treatment/Exercise - 04/28/21 0001      Neck Exercises: Machines for Strengthening   UBE (Upper Arm Bike) Lvl 2.5 3 min fwd/back each way      Neck Exercises: Seated   Other Seated Exercise scapular retraction 2 sets; 10 reps x 5 sec hold, seated cervical retraction 3 seconds x 10      Modalities   Modalities Moist Heat      Moist Heat Therapy   Number Minutes Moist Heat 5 Minutes   c stretch   Moist Heat Location Cervical   Lt     Manual Therapy   Manual therapy comments compression to Lt upper trap, cPA T3-T8 g3      Neck Exercises: Stretches   Upper Trapezius Stretch Left;30 seconds;5 reps    Levator Stretch Left;30 seconds;5 reps            Trigger Point Dry Needling - 04/28/21 0001    Muscles Treated Head and Neck Upper trapezius   Lt   Upper Trapezius Response Twitch reponse elicited                  PT Short Term Goals - 04/28/21 1507      PT SHORT TERM GOAL #1   Title Patient will demonstrate independent use of home exercise program to maintain progress from in clinic treatments.    Time 3    Period Weeks    Status Achieved    Target Date 04/29/21             PT Long Term Goals - 04/08/21 1338      PT LONG TERM GOAL #1   Title Patient will demonstrate/report pain at worst less than or equal  to 2/10 to facilitate minimal limitation in daily activity secondary to pain symptoms.    Time 8    Period Weeks    Status New  Target Date 06/03/21      PT LONG TERM GOAL #2   Title Patient will demonstrate independent use of home exercise program to facilitate ability to maintain/progress functional gains from skilled physical therapy services.    Time 8    Period Weeks    Status New    Target Date 06/03/21      PT LONG TERM GOAL #3   Title Pt. will demonstrate FOTO outcome > or = 53 to indicate reduced disability due to condition.    Time 8    Period Weeks    Status New    Target Date 06/03/21      PT LONG TERM GOAL #4   Title Pt. will demonstrate cervical AROM WFL s symptoms to facilitate ability to perform usual head movement, driving at PLOF s limitation.    Time 8    Period Weeks    Status New    Target Date 06/03/21      PT LONG TERM GOAL #5   Title Pt. will demonstrate Lt shoulder MMT 5/5 throughout to facilitate usual lifting, carrying at PLOF s limitation.    Time 8    Period Weeks    Status New    Target Date 06/03/21                 Plan - 04/28/21 1505    Clinical Impression Statement Pt. indicated today's treatment was more on par with first visit c more noted twitch response in Lt upper trap.  Application of heat c stretching was beneficial as well for symptom relief post manual intervention.  Thoracic hypomobility noted as well and treated today as new intervention.    Personal Factors and Comorbidities Comorbidity 3+    Comorbidities DDD, fibromyalgia, HTN, hypothryoidism, GERD, history of breast cancer    Examination-Activity Limitations Sit;Sleep;Bend;Carry;Dressing;Lift;Reach Overhead    Examination-Participation Restrictions Cleaning;Community Activity;Driving;Laundry;Meal Prep    Stability/Clinical Decision Making Stable/Uncomplicated    Rehab Potential Good    PT Frequency Other (comment)   1-2x/week   PT Duration 8 weeks    PT  Treatment/Interventions ADLs/Self Care Home Management;Moist Heat;Traction;Spinal Manipulations;Joint Manipulations;Passive range of motion;Dry needling;Manual techniques;Therapeutic activities;Functional mobility training;Therapeutic exercise;DME Instruction;Cryotherapy    PT Next Visit Plan Dry needling and manual for improved mobility for cervical and thoracic region.  Continue postural activation strengthening intervention (lower trap, middle trap)    PT Home Exercise Plan [id number]    Consulted and Agree with Plan of Care Patient           Patient will benefit from skilled therapeutic intervention in order to improve the following deficits and impairments:  Hypomobility,Decreased endurance,Impaired UE functional use,Pain,Increased fascial restricitons,Increased muscle spasms,Decreased mobility,Decreased activity tolerance,Impaired perceived functional ability,Improper body mechanics,Impaired flexibility,Postural dysfunction,Decreased coordination,Decreased range of motion  Visit Diagnosis: Cervicalgia  Chronic left shoulder pain  Muscle weakness (generalized)  Abnormal posture     Problem List Patient Active Problem List   Diagnosis Date Noted  . Other spondylosis with radiculopathy, cervical region 03/29/2021  . Insomnia 03/25/2018  . Situational anxiety 03/25/2018  . Encounter for Medicare annual wellness exam 02/19/2017  . Tobacco chew use 05/15/2016  . Malignant neoplasm of upper-outer quadrant of right breast in female, estrogen receptor positive ([place]) 04/21/2016  . Hypothyroidism 04/18/2016  . Obesity (BMI 30.0-34.9) 10/11/2015  . Medication management 09/24/2014  . Cystocele, midline 03/23/2014  . Essential hypertension 12/10/2013  . Mixed hyperlipidemia 12/10/2013  . Prediabetes 12/10/2013  . Vitamin D deficiency 12/10/2013  . DJD  12/10/2013  . DDD, lumbar 12/10/2013  . Fibromyalgia Syndrome 12/10/2013   Scot Jun, PT, DPT, OCS, ATC 04/28/21  3:09  PM    [facility] [address] [place], [place], [address] Phone: [phone]   Fax:  [phone]  Name: Desiree Anderson MRN: [id number] Date of Birth: May 05, 1956

## 2021-05-03 ENCOUNTER — Ambulatory Visit (INDEPENDENT_AMBULATORY_CARE_PROVIDER_SITE_OTHER): Payer: Medicare Other | Admitting: Physical Therapy

## 2021-05-03 ENCOUNTER — Encounter: Payer: Self-pay | Admitting: Physical Therapy

## 2021-05-03 ENCOUNTER — Other Ambulatory Visit: Payer: Self-pay

## 2021-05-03 ENCOUNTER — Ambulatory Visit: Payer: Medicare Other | Admitting: Orthopaedic Surgery

## 2021-05-03 VITALS — BP 161/93 | HR 79 | Ht 62.0 in | Wt 181.0 lb

## 2021-05-03 DIAGNOSIS — M4722 Other spondylosis with radiculopathy, cervical region: Secondary | ICD-10-CM

## 2021-05-03 DIAGNOSIS — R293 Abnormal posture: Secondary | ICD-10-CM | POA: Diagnosis not present

## 2021-05-03 DIAGNOSIS — M542 Cervicalgia: Secondary | ICD-10-CM

## 2021-05-03 DIAGNOSIS — M25512 Pain in left shoulder: Secondary | ICD-10-CM | POA: Diagnosis not present

## 2021-05-03 DIAGNOSIS — M6281 Muscle weakness (generalized): Secondary | ICD-10-CM

## 2021-05-03 DIAGNOSIS — G8929 Other chronic pain: Secondary | ICD-10-CM

## 2021-05-03 NOTE — Progress Notes (Signed)
Office Visit Note   Patient: Desiree Anderson           Date of Birth: 1955/12/05           MRN: [id number] Visit Date: 05/03/2021              Requested by: Unk Pinto, [place],  [place] [address] PCP: Unk Pinto, MD   Assessment & Plan: Visit Diagnoses:  1. Other spondylosis with radiculopathy, cervical region     Plan: Patient's had persistent symptoms has decreased range of motion radicular symptoms failed conservative treatment.  She is prediabetic but has gotten relief with prednisone in the past.  History of breast cancer 2017 and I would recommend proceeding with an MRI scan for evaluation of cervical low spondylosis with left arm radiculopathy symptoms.  Follow-up after scan.  Follow-Up Instructions: No follow-ups on file.   Orders:  Orders Placed This Encounter  Procedures   MR Cervical Spine w/o contrast   No orders of the defined types were placed in this encounter.     Procedures: No procedures performed   Clinical Data: No additional findings.   Subjective: Chief Complaint  Patient presents with   Neck - Pain, Follow-up    HPI 65 year old female returns with greater than 9-month history of progressive left shoulder and neck pain.  She has been on dexamethasone which seems to help.  She has tried dry needling with some improvement.  She has been on gabapentin and Tylenol in the past.  Previous history of breast cancer.  She has used Biofreeze.  Home cervical traction.  She rates her pain is moderate to severe at times severe.  She denies any myelopathic symptoms.  Previous x-ray showed cervical spondylosis with some anterolisthesis at C4-5 and disc base narrowing at C5-6.  Review of Systems patient been treated for hypertension hypothyroidism.  She does have a history of fibromyalgia.  All systems noncontributory to HPI and updated from 03/29/2021 office visit.   Objective: Vital Signs: BP (!) 161/93   Pulse 79    Ht 5\' 2"  (1.575 m)   Wt 181 lb (82.1 kg)   BMI 33.11 kg/m   Physical Exam Constitutional:      Appearance: She is well-developed.  HENT:     Head: Normocephalic.     Right Ear: External ear normal.     Left Ear: External ear normal. There is no impacted cerumen.  Eyes:     Pupils: Pupils are equal, round, and reactive to light.  Neck:     Thyroid: No thyromegaly.     Trachea: No tracheal deviation.  Cardiovascular:     Rate and Rhythm: Normal rate.  Pulmonary:     Effort: Pulmonary effort is normal.  Abdominal:     Palpations: Abdomen is soft.  Lymphadenopathy:     Cervical: No cervical adenopathy.  Skin:    General: Skin is warm and dry.  Neurological:     Mental Status: She is alert and oriented to person, place, and time.  Psychiatric:        Behavior: Behavior normal.    Ortho Exam patient has brachial plexus tenderness positive Spurling on the left.  Mild tenderness of brachial plexus on the right.  Upper extremity reflexes are 2+ and symmetrical.  Less than 5% rotation to the left.  She has a percent normal rotation of the right side.  She is able to heel and toe walk no lower extremity hyperreflexia or  clonus.  Negative drop arm test negative impingement right left shoulder.  Specialty Comments:  No specialty comments available.  Imaging: No results found.   PMFS History: Patient Active Problem List   Diagnosis Date Noted   Other spondylosis with radiculopathy, cervical region 03/29/2021   Insomnia 03/25/2018   Situational anxiety 03/25/2018   Encounter for Medicare annual wellness exam 02/19/2017   Tobacco chew use 05/15/2016   Malignant neoplasm of upper-outer quadrant of right breast in female, estrogen receptor positive ([place]) 04/21/2016   Hypothyroidism 04/18/2016   Obesity (BMI 30.0-34.9) 10/11/2015   Medication management 09/24/2014   Cystocele, midline 03/23/2014   Essential hypertension 12/10/2013   Mixed hyperlipidemia 12/10/2013    Prediabetes 12/10/2013   Vitamin D deficiency 12/10/2013   DJD  12/10/2013   DDD, lumbar 12/10/2013   Fibromyalgia Syndrome 12/10/2013   Past Medical History:  Diagnosis Date   Anxiety    Breast cancer ([place])    Complication of anesthesia    hypotension   DDD (degenerative disc disease), lumbar    Erosion of vaginal mesh ([facility])    Fibromyalgia    GERD (gastroesophageal reflux disease)    History of gastric ulcer    History of radiation therapy 07/06/16- 08/22/16   Right Breast   Hypertension    Hypothyroidism    Personal history of radiation therapy    Rectocele     Family History  Problem Relation Age of Onset   Diabetes Mother    Diverticulitis Mother    Lung cancer Brother    Intestinal polyp Sister    Diverticulitis Sister     Past Surgical History:  Procedure Laterality Date   BLADDER TACK  2003   BREAST BIOPSY Right 04/18/2016   BREAST LUMPECTOMY Right 05/18/2016   Breast mass removal Left 1985   Benign   CARDIOVASCULAR STRESS TEST  07-02-2013  DR DALTON MCLEAN   LOW RISK NUCLEAR STUDY WITH A SMALL, MILD APICAL SEPTAL FIXED PERFUSION DEFECT .  GIVEN NORMAL WALL FUNCTION , SUSPECT REPRESENTS ATTENUATION/  EF 74%/  NO ISCHEMIA   CATARACT EXTRACTION W/ INTRAOCULAR LENS IMPLANT Left 01/2014   CATARACT EXTRACTION W/ INTRAOCULAR LENS IMPLANT Right 12/2017   Dr. Manuella Ghazi   CHOLECYSTECTOMY N/A 12/10/2017   Procedure: LAPAROSCOPIC CHOLECYSTECTOMY;  Surgeon: Rolm Bookbinder, MD;  Location: [place];  Service: General;  Laterality: N/A;   CYSTO/  HYDRODISTENTION/  BLADDER BX/   INSTILLATION THERAPY  08-29-2010   EXCISION OF MESH N/A 03/23/2014   Procedure: EXCISION OF VAGINAL AND PERI-URETHRAL MESH, EXTRACTION FROM URETHRA TO APEX, insertion of XENFORM in the vaginal vault;  Surgeon: Ailene Rud, MD;  Location: [facility];  Service: Urology;  Laterality: N/A;   EXCISION VAGINAL CYST N/A 04/06/2014   Procedure: VAGINAL SPECULUM EXAMINATION/POSSIBLE DRAINAGE  OF HEMATOMA;  Surgeon: Ailene Rud, MD;  Location: [facility];  Service: Urology;  Laterality: N/A;   INCONTINENCE SURGERY  2009  &  2005   RADIOACTIVE SEED GUIDED PARTIAL MASTECTOMY WITH AXILLARY SENTINEL LYMPH NODE BIOPSY Right 05/18/2016   Procedure: RADIOACTIVE SEED GUIDED RIGHT BREAST LUMPECTOMY WITH AXILLARY SENTINEL LYMPH NODE BIOPSY;  Surgeon: Rolm Bookbinder, MD;  [facility];  Service: General;  Laterality: Right;  RADIOACTIVE SEED GUIDED RIGHT BREAST LUMPECTOMY WITH AXILLARY SENTINEL LYMPH NODE BIOPSY   TRANSTHORACIC ECHOCARDIOGRAM  06-16-2013   GRADE I DIASTOLIC DYSFUNCTION/  EF 55-60%/  MILD AR/  TRIVIAL MR  &  TR   VAGINAL HYSTERECTOMY  2000  w/  unilateral salpingoophorectomy   VEIN BYPASS SURGERY  AGE 48   RIGHT LEG   Social History   Occupational History   Not on file  Tobacco Use   Smoking status: Former    Years: 20.00    Pack years: 0.00    Types: Cigarettes    Quit date: 11/28/1995    Years since quitting: 25.4   Smokeless tobacco: Current    Types: Snuff   Tobacco comments:    dips daily  Vaping Use   Vaping Use: Never used  Substance and Sexual Activity   Alcohol use: No   Drug use: No   Sexual activity: Not on file

## 2021-05-03 NOTE — Therapy (Addendum)
[facility] [address] [place], [place], [address] Phone: [phone]   Fax:  [phone]  Physical Therapy Treatment/Discharge   Patient Details  Name: Desiree Anderson MRN: [id number] Date of Birth: [birth year] Referring Provider (PT): Dr. Lorin Mercy   Encounter Date: 05/03/2021   PT End of Session - 05/03/21 1214     Visit Number 4    Number of Visits 12    Date for PT Re-Evaluation 06/03/21    Authorization Type BCBS    Progress Note Due on Visit 10    PT Start Time 1140    PT Stop Time 1205    PT Time Calculation (min) 25 min    Activity Tolerance Patient tolerated treatment well    Behavior During Therapy [facility] for tasks assessed/performed             Past Medical History:  Diagnosis Date   Anxiety    Breast cancer ([place])    Complication of anesthesia    hypotension   DDD (degenerative disc disease), lumbar    Erosion of vaginal mesh ([facility])    Fibromyalgia    GERD (gastroesophageal reflux disease)    History of gastric ulcer    History of radiation therapy 07/06/16- 08/22/16   Right Breast   Hypertension    Hypothyroidism    Personal history of radiation therapy    Rectocele     Past Surgical History:  Procedure Laterality Date   BLADDER TACK  2003   BREAST BIOPSY Right 04/18/2016   BREAST LUMPECTOMY Right 05/18/2016   Breast mass removal Left 1985   Benign   CARDIOVASCULAR STRESS TEST  07-02-2013  DR DALTON MCLEAN   LOW RISK NUCLEAR STUDY WITH A SMALL, MILD APICAL SEPTAL FIXED PERFUSION DEFECT .  GIVEN NORMAL WALL FUNCTION , SUSPECT REPRESENTS ATTENUATION/  EF 74%/  NO ISCHEMIA   CATARACT EXTRACTION W/ INTRAOCULAR LENS IMPLANT Left 01/2014   CATARACT EXTRACTION W/ INTRAOCULAR LENS IMPLANT Right 12/2017   Dr. Manuella Ghazi   CHOLECYSTECTOMY N/A 12/10/2017   Procedure: LAPAROSCOPIC CHOLECYSTECTOMY;  Surgeon: Rolm Bookbinder, MD;  Location: [place];  Service: General;  Laterality: N/A;   CYSTO/  HYDRODISTENTION/  BLADDER BX/    INSTILLATION THERAPY  08-29-2010   EXCISION OF MESH N/A 03/23/2014   Procedure: EXCISION OF VAGINAL AND PERI-URETHRAL MESH, EXTRACTION FROM URETHRA TO APEX, insertion of XENFORM in the vaginal vault;  Surgeon: Ailene Rud, MD;  Location: [facility];  Service: Urology;  Laterality: N/A;   EXCISION VAGINAL CYST N/A 04/06/2014   Procedure: VAGINAL SPECULUM EXAMINATION/POSSIBLE DRAINAGE OF HEMATOMA;  Surgeon: Ailene Rud, MD;  Location: [facility];  Service: Urology;  Laterality: N/A;   INCONTINENCE SURGERY  2009  &  2005   RADIOACTIVE SEED GUIDED PARTIAL MASTECTOMY WITH AXILLARY SENTINEL LYMPH NODE BIOPSY Right 05/18/2016   Procedure: RADIOACTIVE SEED GUIDED RIGHT BREAST LUMPECTOMY WITH AXILLARY SENTINEL LYMPH NODE BIOPSY;  Surgeon: Rolm Bookbinder, MD;  Location: [place];  Service: General;  Laterality: Right;  RADIOACTIVE SEED GUIDED RIGHT BREAST LUMPECTOMY WITH AXILLARY SENTINEL LYMPH NODE BIOPSY   TRANSTHORACIC ECHOCARDIOGRAM  06-16-2013   GRADE I DIASTOLIC DYSFUNCTION/  EF 55-60%/  MILD AR/  TRIVIAL MR  &  TR   VAGINAL HYSTERECTOMY  2000   w/  unilateral salpingoophorectomy   VEIN BYPASS SURGERY  AGE 65   RIGHT LEG    There were no vitals filed for this visit.   Subjective Assessment - 05/03/21 1143  Subjective reports she's in more pain today.  MD scheduled an MRI    Pertinent History DDD, fibromyalgia, HTN, hypothryoidism, GERD, history of breast cancer    Limitations Lifting;Sitting;House hold activities    Diagnostic tests xrays (spondylosis diagnosis per referral)    Patient Stated Goals Reduce pain    Currently in Pain? Yes    Pain Score 9     Pain Location Neck    Pain Orientation Left;Right;Mid;Lower;Posterior    Pain Descriptors / Indicators Penetrating;Aching;Throbbing    Pain Type Chronic pain    Pain Onset More than a month ago    Pain Frequency Constant    Aggravating Factors  constant pain    Pain  Relieving Factors medicine                               [facility] Adult PT Treatment/Exercise - 05/03/21 1144       Neck Exercises: Machines for Strengthening   UBE (Upper Arm Bike) L2 x 6 min (3' each direction)      Neck Exercises: Seated   Other Seated Exercise neck half circles x 10 reps each directions      Manual Therapy   Manual therapy comments compression to bil suboccipitals as well as bil upper traps              Trigger Point Dry Needling - 05/03/21 1214     Consent Given? Yes    Education Handout Provided Previously provided    Muscles Treated Head and Neck Upper trapezius;Suboccipitals    Upper Trapezius Response Twitch reponse elicited    Suboccipitals Response Twitch response elicited                    PT Short Term Goals - 04/28/21 1507       PT SHORT TERM GOAL #1   Title Patient will demonstrate independent use of home exercise program to maintain progress from in clinic treatments.    Time 3    Period Weeks    Status Achieved    Target Date 04/29/21               PT Long Term Goals - 05/03/21 1215       PT LONG TERM GOAL #1   Title Patient will demonstrate/report pain at worst less than or equal to 2/10 to facilitate minimal limitation in daily activity secondary to pain symptoms.    Time 8    Period Weeks    Status On-going    Target Date 06/03/21      PT LONG TERM GOAL #2   Title Patient will demonstrate independent use of home exercise program to facilitate ability to maintain/progress functional gains from skilled physical therapy services.    Time 8    Period Weeks    Status On-going      PT LONG TERM GOAL #3   Title Pt. will demonstrate FOTO outcome > or = 53 to indicate reduced disability due to condition.    Time 8    Period Weeks    Status On-going      PT LONG TERM GOAL #4   Title Pt. will demonstrate cervical AROM WFL s symptoms to facilitate ability to perform usual head movement,  driving at PLOF s limitation.    Time 8    Period Weeks    Status On-going      PT LONG TERM GOAL #5  Title Pt. will demonstrate Lt shoulder MMT 5/5 throughout to facilitate usual lifting, carrying at PLOF s limitation.    Time 8    Period Weeks    Status On-going                   Plan - 05/03/21 1215     Clinical Impression Statement Pt still with reports of elevated pain and exacerbated by neck movements.  She did have positive response to DN and manual therapy today reporting pain down to 6-710 after session.  All goals ongoing at this time.  Will continue to benefit from PT to maximize function.    Personal Factors and Comorbidities Comorbidity 3+    Comorbidities DDD, fibromyalgia, HTN, hypothryoidism, GERD, history of breast cancer    Examination-Activity Limitations Sit;Sleep;Bend;Carry;Dressing;Lift;Reach Overhead    Examination-Participation Restrictions Cleaning;Community Activity;Driving;Laundry;Meal Prep    Stability/Clinical Decision Making Stable/Uncomplicated    Rehab Potential Good    PT Frequency Other (comment)   1-2x/week   PT Duration 8 weeks    PT Treatment/Interventions ADLs/Self Care Home Management;Moist Heat;Traction;Spinal Manipulations;Joint Manipulations;Passive range of motion;Dry needling;Manual techniques;Therapeutic activities;Functional mobility training;Therapeutic exercise;DME Instruction;Cryotherapy    PT Next Visit Plan Dry needling and manual for improved mobility for cervical and thoracic region.  Continue postural activation strengthening intervention (lower trap, middle trap)    PT Home Exercise Plan [email or web address]    Consulted and Agree with Plan of Care Patient             Patient will benefit from skilled therapeutic intervention in order to improve the following deficits and impairments:  Hypomobility,Decreased endurance,Impaired UE functional use,Pain,Increased fascial restricitons,Increased muscle spasms,Decreased  mobility,Decreased activity tolerance,Impaired perceived functional ability,Improper body mechanics,Impaired flexibility,Postural dysfunction,Decreased coordination,Decreased range of motion  Visit Diagnosis: Cervicalgia  Chronic left shoulder pain  Muscle weakness (generalized)  Abnormal posture     Problem List Patient Active Problem List   Diagnosis Date Noted   Other spondylosis with radiculopathy, cervical region 03/29/2021   Insomnia 03/25/2018   Situational anxiety 03/25/2018   Encounter for Medicare annual wellness exam 02/19/2017   Tobacco chew use 05/15/2016   Malignant neoplasm of upper-outer quadrant of right breast in female, estrogen receptor positive ([place]) 04/21/2016   Hypothyroidism 04/18/2016   Obesity (BMI 30.0-34.9) 10/11/2015   Medication management 09/24/2014   Cystocele, midline 03/23/2014   Essential hypertension 12/10/2013   Mixed hyperlipidemia 12/10/2013   Prediabetes 12/10/2013   Vitamin D deficiency 12/10/2013   DJD  12/10/2013   DDD, lumbar 12/10/2013   Fibromyalgia Syndrome 12/10/2013     Laureen Abrahams, PT, DPT 05/03/21 12:21 PM  PHYSICAL THERAPY DISCHARGE SUMMARY  Visits from Start of Care: 4  Current functional level related to goals / functional outcomes: See note   Remaining deficits: See note   Education / Equipment: HEP   Patient agrees to discharge. Patient goals were partially met. Patient is being discharged due to not returning since the last visit.  Scot Jun, PT, DPT, OCS, ATC 06/27/21  9:48 AM      [facility] [address] [place], [place], [address] Phone: [phone]   Fax:  [phone]  Name: Desiree Anderson MRN: [id number] Date of Birth: 1956-05-03

## 2021-05-10 ENCOUNTER — Encounter: Payer: Medicare Other | Admitting: Rehabilitative and Restorative Service Providers"

## 2021-05-12 ENCOUNTER — Other Ambulatory Visit: Payer: Self-pay

## 2021-05-12 ENCOUNTER — Ambulatory Visit
Admission: RE | Admit: 2021-05-12 | Discharge: 2021-05-12 | Disposition: A | Discharge status: Home / Self Care | Payer: Medicare Other | Source: Ambulatory Visit | Attending: Orthopaedic Surgery | Admitting: Orthopaedic Surgery

## 2021-05-12 DIAGNOSIS — M4802 Spinal stenosis, cervical region: Secondary | ICD-10-CM | POA: Diagnosis not present

## 2021-05-12 DIAGNOSIS — M4722 Other spondylosis with radiculopathy, cervical region: Secondary | ICD-10-CM

## 2021-05-17 ENCOUNTER — Encounter: Payer: Medicare Other | Admitting: Rehabilitative and Restorative Service Providers"

## 2021-05-18 ENCOUNTER — Other Ambulatory Visit: Payer: Self-pay | Admitting: Gastroenterology

## 2021-05-24 ENCOUNTER — Encounter: Payer: Medicare Other | Admitting: Rehabilitative and Restorative Service Providers"

## 2021-05-31 ENCOUNTER — Encounter: Payer: Medicare Other | Admitting: Rehabilitative and Restorative Service Providers"

## 2021-06-01 ENCOUNTER — Other Ambulatory Visit: Payer: Self-pay | Admitting: Gastroenterology

## 2021-06-01 ENCOUNTER — Other Ambulatory Visit: Payer: Self-pay | Admitting: Physician Assistant

## 2021-06-14 ENCOUNTER — Ambulatory Visit: Payer: Medicare Other | Admitting: Orthopaedic Surgery

## 2021-06-24 ENCOUNTER — Other Ambulatory Visit: Payer: Self-pay | Admitting: Gastroenterology

## 2021-06-24 ENCOUNTER — Other Ambulatory Visit: Payer: Self-pay | Admitting: Oncology

## 2021-07-01 ENCOUNTER — Other Ambulatory Visit: Payer: Self-pay

## 2021-07-01 ENCOUNTER — Ambulatory Visit: Payer: Medicare Other | Admitting: Orthopaedic Surgery

## 2021-07-01 ENCOUNTER — Encounter: Payer: Self-pay | Admitting: Orthopaedic Surgery

## 2021-07-01 VITALS — BP 158/87 | HR 71 | Ht 62.75 in | Wt 175.0 lb

## 2021-07-01 DIAGNOSIS — M4802 Spinal stenosis, cervical region: Secondary | ICD-10-CM

## 2021-07-01 DIAGNOSIS — M4722 Other spondylosis with radiculopathy, cervical region: Secondary | ICD-10-CM | POA: Diagnosis not present

## 2021-07-01 NOTE — Progress Notes (Signed)
Office Visit Note   Patient: Desiree Anderson           Date of Birth: 1956/09/13           MRN: [id number] Visit Date: 07/01/2021              Requested by: Unk Pinto, MD [address] [place],  [place] [address] PCP: Unk Pinto, MD   Assessment & Plan: Visit Diagnoses:  1. Other spondylosis with radiculopathy, cervical region   2. Spinal stenosis of cervical region     Plan: Patient has some anterolisthesis at C4-5 and mild degeneration at C6-7 without compression.  C5-6 is progressed with narrowing down less than 9 mm with cervical spinal stenosis and loss of anterior CSF signal and some cord deflection.  Moderate to severe foraminal narrowing on the left.  We discussed single level cervical fusion C5-6 with overnight stay use of a soft collar.  We discussed risk surgery including dysphagia dysphonia pseudoarthrosis possibility of reoperation or adjacent level progression in the future.  Questions were elicited and answered.  She can think about this and we will recheck her in 1 month.  She will call if she like to proceed with scheduling.  Follow-Up Instructions: Return in about 1 month (around 08/01/2021).   Orders:  No orders of the defined types were placed in this encounter.  No orders of the defined types were placed in this encounter.     Procedures: No procedures performed   Clinical Data: No additional findings.   Subjective: Chief Complaint  Patient presents with   Neck - Pain, Follow-up    MRI cervical spine review    HPI 17-monthfollow-up for continued problems with cervical spondylosis.  Patient prediabetic previous relief with prednisone in the past.  Cervical MRI scan has been obtained due to patient's ongoing symptoms and is reviewed today with her.  No fever chills no myelopathic symptoms.  Persistent neck and left side shoulder pain.  She has been through gabapentin, Tylenol, dry needling, physical therapy, home cervical  traction, Biofreeze.  Pain tends to wax and wane.  Review of Systems 14 point review of systems updated unchanged from 05/03/2021 note.  Of note is a history of fibromyalgia. Also history of right breast cancer.  Objective: Vital Signs: BP (!) 158/87   Pulse 71   Ht 5' 2.75" (1.594 m)   Wt 175 lb (79.4 kg)   BMI 31.25 kg/m   Physical Exam Constitutional:      Appearance: She is well-developed.  HENT:     Head: Normocephalic.     Right Ear: External ear normal.     Left Ear: External ear normal. There is no impacted cerumen.  Eyes:     Pupils: Pupils are equal, round, and reactive to light.  Neck:     Thyroid: No thyromegaly.     Trachea: No tracheal deviation.  Cardiovascular:     Rate and Rhythm: Normal rate.  Pulmonary:     Effort: Pulmonary effort is normal.  Abdominal:     Palpations: Abdomen is soft.  Musculoskeletal:     Cervical back: No rigidity.  Skin:    General: Skin is warm and dry.  Neurological:     Mental Status: She is alert and oriented to person, place, and time.  Psychiatric:        Behavior: Behavior normal.    Ortho Exam patient has significant brachial plexus tenderness on the left positive Spurling on the left.  Mild tenderness on the right.  Reflexes are 2+ and symmetrical.  Limited rotation less than 25% of normal to the left.  Increased pain with cervical compression and slight improvement with distraction.  Negative impingement right left shoulder negative drop arm test no shoulder subluxation.  No isolated motor weakness upper extremities biceps triceps wrist flexion extension.  No lower extremity clonus.  Specialty Comments:  No specialty comments available.  Imaging: CLINICAL DATA:  Other spondylosis with radiculopathy, cervical region. Chronic pain, spondylosis C5-6.   EXAM: MRI CERVICAL SPINE WITHOUT CONTRAST   TECHNIQUE: Multiplanar, multisequence MR imaging of the cervical spine was performed. No intravenous contrast was  administered.   COMPARISON:  Radiographs of the cervical spine 01/27/2021. Cervical spine MRI 02/15/2013.   FINDINGS: The examination is intermittently motion degraded, limiting evaluation. Most notably, there is moderate/severe motion degradation of the sagittal STIR sequence.   Alignment: Cervical levocurvature. Reversal of the expected cervical lordosis. Trace C3-4 and C4-C5 grade 1 anterolisthesis.   Vertebrae: Vertebral body height is maintained. Edema is present within the left C4 and C5 articular pillars. Within the limitations of motion degradation, no significant marrow edema or focal suspicious osseous lesion is identified elsewhere.   Cord: No spinal cord signal abnormality is identified.   Posterior Fossa, vertebral arteries, paraspinal tissues: No abnormality identified within included portions of the posterior fossa. Flow voids preserved within the imaged cervical vertebral arteries. Paraspinal soft tissues within normal limits.   Disc levels:   Unless otherwise stated, the level by level findings below have not significantly changed since the prior MRI of 02/15/2013.   Progressive multilevel disc degeneration. Most notably, progressive moderate disc degeneration is present at C5-C6 and C6-C7.   C2-C3: Mild facet arthrosis. No significant disc herniation or stenosis.   C3-C4: Trace grade 1 anterolisthesis, new from the prior exam. Progressive facet arthrosis. No significant disc herniation or stenosis.   C4-C5: Trace grade 1 anterolisthesis, new from the prior exam. No significant disc herniation or spinal canal stenosis. Progressive facet arthrosis (advanced on the left). Left-sided ligamentum flavum hypertrophy, new from the prior exam. Mild left neural foraminal narrowing, new from the prior exam.   C5-C6: Disc bulge with endplate spurring and left greater than right disc osteophyte ridge/uncinate hypertrophy, progressed. Progressive facet arthrosis  (greater on the left). The disc bulge contributes to progressive mild spinal canal stenosis, contacting and mildly flattening the ventral spinal cord. Progressive bilateral neural foraminal narrowing (mild right, moderate/severe left).   C6-C7: Shallow disc bulge. Mild endplate spurring and uncinate hypertrophy, progressed. Progressive facet arthrosis. Mild relative spinal canal narrowing without spinal cord mass effect. Progressive mild bilateral neural foraminal narrowing.   C7-T1: Minimal facet arthrosis. No significant disc herniation or stenosis.   IMPRESSION: Motion degraded examination, as described.   Cervical spondylosis, as outlined and having progressed at several levels since the prior MRI of 02/15/2013. Findings are most notably as follows.   At C5-C6, there is progressive moderate disc degeneration. Disc bulge with endplate spurring and left greater than right disc osteophyte ridge/uncinate hypertrophy. Progressive facet arthrosis (greater on the left). The disc bulge contributes to progressive mild spinal canal stenosis, contacting and mildly flattening the ventral spinal cord. Progressive bilateral neural foraminal narrowing (mild right, moderate/severe left).   No more than mild spinal canal or neural foraminal narrowing at the remaining levels.   Notably, there is progressive advanced facet arthrosis on the left at C4-C5 with associated marrow edema in the left C4 and C5 articular pillars.  Electronically Signed   By: Kellie Simmering DO   On: 05/12/2021 07:43     PMFS History: Patient Active Problem List   Diagnosis Date Noted   Spinal stenosis of cervical region 07/04/2021   Other spondylosis with radiculopathy, cervical region 03/29/2021   Insomnia 03/25/2018   Situational anxiety 03/25/2018   Encounter for Medicare annual wellness exam 02/19/2017   Tobacco chew use 05/15/2016   Malignant neoplasm of upper-outer quadrant of right breast in  female, estrogen receptor positive ([facility]) 04/21/2016   Hypothyroidism 04/18/2016   Obesity (BMI 30.0-34.9) 10/11/2015   Medication management 09/24/2014   Cystocele, midline 03/23/2014   Essential hypertension 12/10/2013   Mixed hyperlipidemia 12/10/2013   Prediabetes 12/10/2013   Vitamin D deficiency 12/10/2013   DJD  12/10/2013   DDD, lumbar 12/10/2013   Fibromyalgia Syndrome 12/10/2013   Past Medical History:  Diagnosis Date   Anxiety    Breast cancer ([place] [place])    Complication of anesthesia    hypotension   DDD (degenerative disc disease), lumbar    Erosion of vaginal mesh ([facility])    Fibromyalgia    GERD (gastroesophageal reflux disease)    History of gastric ulcer    History of radiation therapy 07/06/16- 08/22/16   Right Breast   Hypertension    Hypothyroidism    Personal history of radiation therapy    Rectocele     Family History  Problem Relation Age of Onset   Diabetes Mother    Diverticulitis Mother    Lung cancer Brother    Intestinal polyp Sister    Diverticulitis Sister     Past Surgical History:  Procedure Laterality Date   BLADDER TACK  2003   BREAST BIOPSY Right 04/18/2016   BREAST LUMPECTOMY Right 05/18/2016   Breast mass removal Left 1985   Benign   CARDIOVASCULAR STRESS TEST  07-02-2013  DR DALTON MCLEAN   LOW RISK NUCLEAR STUDY WITH A SMALL, MILD APICAL SEPTAL FIXED PERFUSION DEFECT .  GIVEN NORMAL WALL FUNCTION , SUSPECT REPRESENTS ATTENUATION/  EF 74%/  NO ISCHEMIA   CATARACT EXTRACTION W/ INTRAOCULAR LENS IMPLANT Left 01/2014   CATARACT EXTRACTION W/ INTRAOCULAR LENS IMPLANT Right 12/2017   Dr. Manuella Ghazi   CHOLECYSTECTOMY N/A 12/10/2017   Procedure: LAPAROSCOPIC CHOLECYSTECTOMY;  Surgeon: Rolm Bookbinder, MD;  Location: [facility];  Service: General;  Laterality: N/A;   CYSTO/  HYDRODISTENTION/  BLADDER BX/   INSTILLATION THERAPY  08-29-2010   EXCISION OF MESH N/A 03/23/2014   Procedure: EXCISION OF VAGINAL AND PERI-URETHRAL MESH, EXTRACTION FROM URETHRA TO  APEX, insertion of XENFORM in the vaginal vault;  Surgeon: Ailene Rud, MD;  Location: [facility];  Service: Urology;  Laterality: N/A;   EXCISION VAGINAL CYST N/A 04/06/2014   Procedure: VAGINAL SPECULUM EXAMINATION/POSSIBLE DRAINAGE OF HEMATOMA;  Surgeon: Ailene Rud, MD;  Location: [facility];  Service: Urology;  Laterality: N/A;   INCONTINENCE SURGERY  2009  &  2005   RADIOACTIVE SEED GUIDED PARTIAL MASTECTOMY WITH AXILLARY SENTINEL LYMPH NODE BIOPSY Right 05/18/2016   Procedure: RADIOACTIVE SEED GUIDED RIGHT BREAST LUMPECTOMY WITH AXILLARY SENTINEL LYMPH NODE BIOPSY;  Surgeon: Rolm Bookbinder, MD;  Location: [facility];  Service: General;  Laterality: Right;  RADIOACTIVE SEED GUIDED RIGHT BREAST LUMPECTOMY WITH AXILLARY SENTINEL LYMPH NODE BIOPSY   TRANSTHORACIC ECHOCARDIOGRAM  06-16-2013   GRADE I DIASTOLIC DYSFUNCTION/  EF 55-60%/  MILD AR/  TRIVIAL MR  &  TR   VAGINAL HYSTERECTOMY  2000  w/  unilateral salpingoophorectomy   VEIN BYPASS SURGERY  AGE 62   RIGHT LEG   Social History   Occupational History   Not on file  Tobacco Use   Smoking status: Former    Years: 20.00    Types: Cigarettes    Quit date: 11/28/1995    Years since quitting: 25.6   Smokeless tobacco: Current    Types: Snuff   Tobacco comments:    dips daily  Vaping Use   Vaping Use: Never used  Substance and Sexual Activity   Alcohol use: No   Drug use: No   Sexual activity: Not on file

## 2021-07-04 DIAGNOSIS — M4802 Spinal stenosis, cervical region: Secondary | ICD-10-CM | POA: Insufficient documentation

## 2021-07-08 ENCOUNTER — Other Ambulatory Visit: Payer: Self-pay

## 2021-07-08 DIAGNOSIS — C50411 Malignant neoplasm of upper-outer quadrant of right female breast: Secondary | ICD-10-CM

## 2021-07-08 DIAGNOSIS — Z170 Estrogen receptor positive status [ER+]: Secondary | ICD-10-CM

## 2021-07-10 NOTE — Progress Notes (Addendum)
[facility]  Telephone:(336) [phone] Fax:(336) [phone]     ID: Desiree Anderson DOB: 18-Jul-1956  MR#: [id number]  JYN#:[id number]  Patient Care Team: Lucky Cowboy, MD as PCP - General (Internal Medicine) Pricilla Riffle, MD as Consulting Physician (Cardiology) Eldred Manges, MD as Consulting Physician (Orthopedic Surgery) Jethro Bolus, MD (Inactive) as Consulting Physician (Urology) Waynard Reeds, MD as Consulting Physician (Obstetrics and Gynecology) Temisha Murley, Valentino Hue, MD as Consulting Physician (Oncology) Emelia Loron, MD as Consulting Physician (General Surgery) Lurline Hare, MD as Consulting Physician (Radiation Oncology) OTHER MD:  CHIEF COMPLAINT: Estrogen receptor positive invasive breast cancer  CURRENT TREATMENT: Completing 5 years of tamoxifen   INTERVAL HISTORY: Topacio returns today for follow-up and treatment of her estrogen positive bresat cancer.  She continues on tamoxifen. She tolerates this moderately well.  Hot flashes, have mproved with taking Effexor.    She is behind on mammography, with her last imaging July 2021  On 05/12/2021 Dr Annell Greening obtained an MRI of the cervical spine to evaluate radiculopathy.  This showed significant degenerative disease but no evidence of metastases.  REVIEW OF SYSTEMS: [name] continues to have discomfort in the right axilla and sometimes in the right breast as well.  She understands that this is postoperative and is not related to active breast cancer.  She is using an over-the-counter cream which is helping.  For exercise she mostly does housework and yard work.  She is "always busy."  Her husband developed COVID and she took care of him but never did get it herself.  A detailed review of systems was otherwise stable.   COVID 19 VACCINATION STATUS: Pfizer x2, most recently 02/2020   BREAST CANCER HISTORY: From the original intake note:  Desiree Anderson had routine screening mammography showing a  possible area of distortion in the right breast. On 04/18/2016 she had diagnostic right mammography with tomography and [facility]. Breast density was category C. In the upper outer quadrant of the right breast there was a persistent area of architectural distortion which was not palpable on exam. Ultrasonography confirmed a 1.0 cm irregular mass at the 11:00 position 3 cm from the nipple. There was no right axillary lymphadenopathy by ultrasonography.  Biopsy of the right breast mass in question was obtained the same day and showed (S AA [phone]) an invasive ductal carcinoma, grade 2, estrogen and progesterone receptor both 100% positive with strong staining intensity, with an MIB-1 of 5%, and no HER-2 amplification, the signals ratio being 1.57, the number per cell 3.85.  Her subsequent history is as detailed below   PAST MEDICAL HISTORY: Past Medical History:  Diagnosis Date   Anxiety    Breast cancer (HCC)    Complication of anesthesia    hypotension   DDD (degenerative disc disease), lumbar    Erosion of vaginal mesh (HCC)    Fibromyalgia    GERD (gastroesophageal reflux disease)    History of gastric ulcer    History of radiation therapy 07/06/16- 08/22/16   Right Breast   Hypertension    Hypothyroidism    Personal history of radiation therapy    Rectocele     PAST SURGICAL HISTORY: Past Surgical History:  Procedure Laterality Date   BLADDER TACK  2003   BREAST BIOPSY Right 04/18/2016   BREAST LUMPECTOMY Right 05/18/2016   Breast mass removal Left 1985   Benign   CARDIOVASCULAR STRESS TEST  07-02-2013  DR DALTON MCLEAN   LOW RISK NUCLEAR  STUDY WITH A SMALL, MILD APICAL SEPTAL FIXED PERFUSION DEFECT .  GIVEN NORMAL WALL FUNCTION , SUSPECT REPRESENTS ATTENUATION/  EF 74%/  NO ISCHEMIA   CATARACT EXTRACTION W/ INTRAOCULAR LENS IMPLANT Left 01/2014   CATARACT EXTRACTION W/ INTRAOCULAR LENS IMPLANT Right 12/2017   Dr. Sherryll Burger    CHOLECYSTECTOMY N/A 12/10/2017   Procedure: LAPAROSCOPIC CHOLECYSTECTOMY;  Surgeon: Emelia Loron, MD;  Location: [facility];  Service: General;  Laterality: N/A;   CYSTO/  HYDRODISTENTION/  BLADDER BX/   INSTILLATION THERAPY  08-29-2010   EXCISION OF MESH N/A 03/23/2014   Procedure: EXCISION OF VAGINAL AND PERI-URETHRAL MESH, EXTRACTION FROM URETHRA TO APEX, insertion of XENFORM in the vaginal vault;  Surgeon: Kathi Ludwig, MD;  Location: [facility];  Service: Urology;  Laterality: N/A;   EXCISION VAGINAL CYST N/A 04/06/2014   Procedure: VAGINAL SPECULUM EXAMINATION/POSSIBLE DRAINAGE OF HEMATOMA;  Surgeon: Kathi Ludwig, MD;  Location: [facility];  Service: Urology;  Laterality: N/A;   INCONTINENCE SURGERY  2009  &  2005   RADIOACTIVE SEED GUIDED PARTIAL MASTECTOMY WITH AXILLARY SENTINEL LYMPH NODE BIOPSY Right 05/18/2016   Procedure: RADIOACTIVE SEED GUIDED RIGHT BREAST LUMPECTOMY WITH AXILLARY SENTINEL LYMPH NODE BIOPSY;  Surgeon: Emelia Loron, MD;  Location: [facility];  Service: General;  Laterality: Right;  RADIOACTIVE SEED GUIDED RIGHT BREAST LUMPECTOMY WITH AXILLARY SENTINEL LYMPH NODE BIOPSY   TRANSTHORACIC ECHOCARDIOGRAM  06-16-2013   GRADE I DIASTOLIC DYSFUNCTION/  EF 55-60%/  MILD AR/  TRIVIAL MR  &  TR   VAGINAL HYSTERECTOMY  2000   w/  unilateral salpingoophorectomy   VEIN BYPASS SURGERY  AGE 22   RIGHT LEG    FAMILY HISTORY Family History  Problem Relation Age of Onset   Diabetes Mother    Diverticulitis Mother    Lung cancer Brother    Intestinal polyp Sister    Diverticulitis Sister   [name] father died at the age of 34 from a myocardial infarction. He was not a smoker however. [name] mother died with renal failure at the age of 19. The patient had 4 brothers, 3 sisters. The only cancer in the family was one brother diagnosed with lung cancer at the age of 75. There is no history of breast or  ovarian cancer in the family.   GYNECOLOGIC HISTORY:  No LMP recorded. Patient has had an ablation. Menarche age 3, first live birth age 89, the patient is GX P2. She stopped having periods approximately 2003. She took hormone replacement approximately 3 years. She also used birth control pills remotely for approximately 20 years, with no complications.   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 Her husband Reita Cliche ran the family business, produce and plants, in [employer], for 17 years. He is now retired. Daughter Human resources officer lives in [place] and is a homemaker. Son Dena Billet Goins lives in [place] and has multiple health problems. The patient has 2 granddaughters. She also has 2 grandchildren who have died. She is a Investment banker, operational.    ADVANCED DIRECTIVES: Not in place   HEALTH MAINTENANCE: Social History   Tobacco Use   Smoking status: Former    Years: 20.00    Types: Cigarettes    Quit date: 11/28/1995    Years since quitting: 25.6   Smokeless tobacco: Current    Types: Snuff  Tobacco comments:    dips daily  Vaping Use   Vaping Use: Never used  Substance Use Topics   Alcohol use: No   Drug use: No       Allergies  Allergen Reactions   Iodine    Lipitor [Atorvastatin] Other (See Comments)    Muscle and legs aches   Shellfish Allergy Diarrhea and Nausea And Vomiting   Betadine [Povidone Iodine] Rash    Current Outpatient Medications  Medication Sig Dispense Refill   ALPRAZolam (XANAX) 0.5 MG tablet TAKE 1/2-1 TABLET BY MOUTH 2-3 TIMES DAILY ONLY IF NEEDED FOR ANXIETY ATTACK AND LIMIT TO 5 DAYS A WEEK TO AVOID ADDICTION AND DEMENTIA 90 tablet 0   Ascorbic Acid (VITAMIN C) 1000 MG tablet Take 1,000 mg by mouth daily.     cetirizine (ZYRTEC) 10 MG tablet Take 10 mg by mouth daily.     Cholecalciferol (VITAMIN D-3) 5000 UNITS TABS Take 5,000 Units by mouth 2 (two) times daily.       cholestyramine (QUESTRAN) 4 g packet Take 1 packet (4 g total) by mouth 2 (two) times daily. 60 each 5   dexamethasone (DECADRON) 4 MG tablet Take  1 tab  3 x day  As directed 30 tablet 1   dicyclomine (BENTYL) 10 MG capsule TAKE 1 CAPSULE BY MOUTH 4 TIMES DAILY BEFORE MEAL(S) AND AT BEDTIME 90 capsule 0   ezetimibe (ZETIA) 10 MG tablet TAKE 1 TABLET BY MOUTH ONCE DAILY FOR CHOLESTEROL 90 tablet 1   famotidine (PEPCID) 40 MG tablet Take 1 tablet (40 mg total) by mouth as needed.     fluticasone (FLONASE) 50 MCG/ACT nasal spray USE TWO SPRAY(S) IN EACH NOSTRIL ONCE DAILY AS NEEDED 48 g 3   gabapentin (NEURONTIN) 600 MG tablet TAKE 1/2 TO 1 (ONE-HALF TO ONE) TABLET BY MOUTH 2 TO 3 TIMES DAILY AS  NEEDED  TO  CONTROL  PAIN 270 tablet 0   hyoscyamine (LEVSIN SL) 0.125 MG SL tablet DISSOLVE 1 TABLET IN MOUTH EVERY 6 HOURS AS NEEDED 60 tablet 0   levothyroxine (SYNTHROID) 100 MCG tablet Take  1 tablet  Daily  on an empty stomach with only water  for 30 minutes & no Antacid meds, Calcium or Magnesium for 4 hours & avoid Biotin 90 tablet 1   Multiple Vitamin (MULTIVITAMIN PO) Take 1 tablet by mouth daily. Takes Geritabs     phentermine (ADIPEX-P) 37.5 MG tablet Take  1/2 to 1 tablet  every Morning  for Dieting & Weight Loss 90 tablet 1   potassium chloride SA (KLOR-CON) 20 MEQ tablet TAKE 1 TABLET BY MOUTH TWICE DAILY FOR  POTASSIUM 180 tablet 2   rosuvastatin (CRESTOR) 10 MG tablet Take  1 tablet Daily  for Cholesterol 90 tablet 1   tamoxifen (NOLVADEX) 20 MG tablet Take 1 tablet by mouth once daily 90 tablet 2   topiramate (TOPAMAX) 50 MG tablet Take  1/2 to 1 tablet  2 x /day  at Suppertime & Bedtime for Dieting & Weight Loss 180 tablet 1   venlafaxine XR (EFFEXOR-XR) 37.5 MG 24 hr capsule TAKE 1 CAPSULE BY MOUTH ONCE DAILY WITH BREAKFAST 90 capsule 0   zinc gluconate 50 MG tablet Take 50 mg by mouth daily.     No current facility-administered medications for this visit.    OBJECTIVE: Middle aged  white woman who appears stated age  Vitals:   07/11/21 1152  BP: (!) 158/79  Pulse: 80  Resp: 20  Temp:  97.7 F (36.5 C)  SpO2: 99%    Body mass index is 33.44 kg/m. ECOG FS:1 - Symptomatic but completely ambulatory  Sclerae unicteric, EOMs intact Wearing a mask No cervical or supraclavicular adenopathy Lungs no rales or rhonchi Heart regular rate and rhythm Abd soft, nontender, positive bowel sounds MSK no focal spinal tenderness, no upper extremity lymphedema Neuro: nonfocal, well oriented, appropriate affect Breasts: The right breast is status postlumpectomy and radiation.  There are the expected posttreatment changes but no evidence of disease recurrence.  I am photographing it below for future reference and specifically to document the slight dimple above the nipple which is of no consequence unless it is interpreted in the future as a change.  Left breast and both axillae are benign   Right breast AUG 2022       LAB RESULTS:  CMP     Component Value Date/Time   NA 142 02/02/2021 1356   NA 144 07/03/2017 1241   K 4.1 02/02/2021 1356   K 3.8 07/03/2017 1241   CL 107 02/02/2021 1356   CO2 28 02/02/2021 1356   CO2 27 07/03/2017 1241   GLUCOSE 143 (H) 02/02/2021 1356   GLUCOSE 142 (H) 07/03/2017 1241   BUN 7 02/02/2021 1356   BUN 11.2 07/03/2017 1241   CREATININE 0.55 02/02/2021 1356   CREATININE 0.9 07/03/2017 1241   CALCIUM 8.4 (L) 02/02/2021 1356   CALCIUM 9.2 07/03/2017 1241   PROT 5.4 (L) 02/02/2021 1356   PROT 6.7 07/03/2017 1241   ALBUMIN 3.7 07/08/2020 1126   ALBUMIN 3.7 07/03/2017 1241   AST 62 (H) 02/02/2021 1356   AST 30 07/08/2020 1126   AST 22 07/03/2017 1241   ALT 47 (H) 02/02/2021 1356   ALT 26 07/08/2020 1126   ALT 37 07/03/2017 1241   ALKPHOS 56 07/08/2020 1126   ALKPHOS 55 07/03/2017 1241   BILITOT 0.3 02/02/2021 1356   BILITOT 0.3 07/08/2020 1126   BILITOT 0.29 07/03/2017 1241   GFRNONAA 100 02/02/2021 1356   GFRAA 116  02/02/2021 1356    INo results found for: SPEP, UPEP  Lab Results  Component Value Date   WBC 14.2 (H) 07/11/2021   NEUTROABS 11.3 (H) 07/11/2021   HGB 12.1 07/11/2021   HCT 36.1 07/11/2021   MCV 84.5 07/11/2021   PLT 241 07/11/2021      Chemistry      Component Value Date/Time   NA 142 02/02/2021 1356   NA 144 07/03/2017 1241   K 4.1 02/02/2021 1356   K 3.8 07/03/2017 1241   CL 107 02/02/2021 1356   CO2 28 02/02/2021 1356   CO2 27 07/03/2017 1241   BUN 7 02/02/2021 1356   BUN 11.2 07/03/2017 1241   CREATININE 0.55 02/02/2021 1356   CREATININE 0.9 07/03/2017 1241      Component Value Date/Time   CALCIUM 8.4 (L) 02/02/2021 1356   CALCIUM 9.2 07/03/2017 1241   ALKPHOS 56 07/08/2020 1126   ALKPHOS 55 07/03/2017 1241   AST 62 (H) 02/02/2021 1356   AST 30 07/08/2020 1126   AST 22 07/03/2017 1241   ALT 47 (H) 02/02/2021 1356   ALT 26 07/08/2020 1126   ALT 37 07/03/2017 1241   BILITOT 0.3 02/02/2021 1356   BILITOT 0.3 07/08/2020 1126   BILITOT 0.29 07/03/2017 1241       No results found for: LABCA2  No components found for: LABCA125  No results for input(s): INR in the last 168 hours.  Urinalysis  Component Value Date/Time   COLORURINE YELLOW 02/02/2021 1356   APPEARANCEUR CLEAR 02/02/2021 1356   LABSPEC 1.005 02/02/2021 1356   PHURINE 6.0 02/02/2021 1356   GLUCOSEU NEGATIVE 02/02/2021 1356   GLUCOSEU NEGATIVE 02/26/2020 1128   HGBUR NEGATIVE 02/02/2021 1356   BILIRUBINUR NEGATIVE 02/26/2020 1128   KETONESUR NEGATIVE 02/02/2021 1356   PROTEINUR NEGATIVE 02/02/2021 1356   UROBILINOGEN 0.2 02/26/2020 1128   NITRITE NEGATIVE 02/02/2021 1356   LEUKOCYTESUR NEGATIVE 02/02/2021 1356    ELIGIBLE FOR AVAILABLE RESEARCH PROTOCOL: no   STUDIES: No results found.   ASSESSMENT: 65 y.o. [place] woman status post right breast upper outer quadrant biopsy 04/18/2016 for a clinical T1b N0, stage IA invasive ductal carcinoma, grade 2, estrogen and  progesterone receptor strongly positive, HER-2 nonamplified, with an MIB-1 of 5%.   (1) Status post right lumpectomy and sentinel lymph node sampling 05/18/2016 for a pT1c pN0, stage IA invasive ductal carcinoma, grade 1, with negative margins, Repeat HER-2 again negative  (2) Oncotype DX score of 12 predicts a 10 year risk of recurrence outside the breast of 8% the patient's only systemic therapy is tamoxifen for 5 years. It also protects no benefit from adjuvant chemotherapy. He  (3) adjuvant radiation 07/06/2016-08/22/2016 Site/dose:   1) right breast / 45 Gy in 25 fractions, 2) right breast boost / 16 Gy in 8 fractions   (4) started tamoxifen 09/27/2016, completing 5 years September 2022   PLAN: Embrie is now a little over 5 years out from definitive surgery for her breast cancer with no evidence of disease recurrence.  This is very favorable.  She is completing 5 years of tamoxifen.  I do not see any indication for continuing beyond 5 years in her case so she is stopping now.  She is very pleased to be "graduating" today.  At this point I feel comfortable releasing her to her primary care physician.  All she will need in terms of breast cancer follow-up is her yearly screening mammography and a yearly physician breast exam.  I will be glad to see Brooke-Lynn again at any point in the future if and when the need arises but as of now are making no future return appointment for her here.  Total encounter time: 30 minutes*   Kairav Russomanno C. Royanne Warshaw, MD 07/11/21 11:59 AM Medical Oncology and Hematology [facility] [address] [place], [place] [address] Tel. [phone]    Fax. [phone]   I, Mickie Bail, am acting as scribe for Dr. Valentino Hue. Elery Cadenhead.  I, Ruthann Cancer MD, have reviewed the above documentation for accuracy and completeness, and I agree with the above.    *Total Encounter Time as defined by the Centers for Medicare and Medicaid Services includes,  in addition to the face-to-face time of a patient visit (documented in the note above) non-face-to-face time: obtaining and reviewing outside history, ordering and reviewing medications, tests or procedures, care coordination (communications with other health care professionals or caregivers) and documentation in the medical record.

## 2021-07-11 ENCOUNTER — Inpatient Hospital Stay: Payer: Medicare Other | Attending: Oncology | Admitting: Oncology

## 2021-07-11 ENCOUNTER — Other Ambulatory Visit: Payer: Self-pay

## 2021-07-11 ENCOUNTER — Inpatient Hospital Stay: Payer: Medicare Other

## 2021-07-11 VITALS — BP 158/79 | HR 80 | Temp 97.7°F | Resp 20 | Ht 62.75 in | Wt 187.3 lb

## 2021-07-11 DIAGNOSIS — E039 Hypothyroidism, unspecified: Secondary | ICD-10-CM | POA: Diagnosis not present

## 2021-07-11 DIAGNOSIS — Z170 Estrogen receptor positive status [ER+]: Secondary | ICD-10-CM

## 2021-07-11 DIAGNOSIS — R590 Localized enlarged lymph nodes: Secondary | ICD-10-CM | POA: Insufficient documentation

## 2021-07-11 DIAGNOSIS — Z87891 Personal history of nicotine dependence: Secondary | ICD-10-CM | POA: Diagnosis not present

## 2021-07-11 DIAGNOSIS — C50411 Malignant neoplasm of upper-outer quadrant of right female breast: Secondary | ICD-10-CM | POA: Diagnosis not present

## 2021-07-11 DIAGNOSIS — Z79899 Other long term (current) drug therapy: Secondary | ICD-10-CM | POA: Insufficient documentation

## 2021-07-11 DIAGNOSIS — Z8249 Family history of ischemic heart disease and other diseases of the circulatory system: Secondary | ICD-10-CM | POA: Insufficient documentation

## 2021-07-11 DIAGNOSIS — Z841 Family history of disorders of kidney and ureter: Secondary | ICD-10-CM | POA: Insufficient documentation

## 2021-07-11 DIAGNOSIS — Z801 Family history of malignant neoplasm of trachea, bronchus and lung: Secondary | ICD-10-CM | POA: Insufficient documentation

## 2021-07-11 DIAGNOSIS — Z8379 Family history of other diseases of the digestive system: Secondary | ICD-10-CM | POA: Diagnosis not present

## 2021-07-11 DIAGNOSIS — Z833 Family history of diabetes mellitus: Secondary | ICD-10-CM | POA: Diagnosis not present

## 2021-07-11 DIAGNOSIS — Z8711 Personal history of peptic ulcer disease: Secondary | ICD-10-CM | POA: Insufficient documentation

## 2021-07-11 DIAGNOSIS — Z923 Personal history of irradiation: Secondary | ICD-10-CM | POA: Diagnosis not present

## 2021-07-11 LAB — CMP (CANCER CENTER ONLY)
ALT: 71 U/L — ABNORMAL HIGH (ref 0–44)
AST: 63 U/L — ABNORMAL HIGH (ref 15–41)
Albumin: 3.4 g/dL — ABNORMAL LOW (ref 3.5–5.0)
Alkaline Phosphatase: 65 U/L (ref 38–126)
Anion gap: 11 (ref 5–15)
BUN: 7 mg/dL — ABNORMAL LOW (ref 8–23)
CO2: 22 mmol/L (ref 22–32)
Calcium: 8.7 mg/dL — ABNORMAL LOW (ref 8.9–10.3)
Chloride: 107 mmol/L (ref 98–111)
Creatinine: 0.68 mg/dL (ref 0.44–1.00)
GFR, Estimated: >60 mL/min (ref >60)
Glucose, Bld: 147 mg/dL — ABNORMAL HIGH (ref 70–99)
Potassium: 3.5 mmol/L (ref 3.5–5.1)
Sodium: 140 mmol/L (ref 135–145)
Total Bilirubin: 0.4 mg/dL (ref 0.3–1.2)
Total Protein: 6.1 g/dL — ABNORMAL LOW (ref 6.5–8.1)

## 2021-07-11 LAB — CBC WITH DIFFERENTIAL (CANCER CENTER ONLY)
Abs Immature Granulocytes: 0.25 10*3/uL — ABNORMAL HIGH (ref 0.00–0.07)
Basophils Absolute: 0 10*3/uL (ref 0.0–0.1)
Basophils Relative: 0 %
Eosinophils Absolute: 0 10*3/uL (ref 0.0–0.5)
Eosinophils Relative: 0 %
HCT: 36.1 % (ref 36.0–46.0)
Hemoglobin: 12.1 g/dL (ref 12.0–15.0)
Immature Granulocytes: 2 %
Lymphocytes Relative: 15 %
Lymphs Abs: 2.1 10*3/uL (ref 0.7–4.0)
MCH: 28.3 pg (ref 26.0–34.0)
MCHC: 33.5 g/dL (ref 30.0–36.0)
MCV: 84.5 fL (ref 80.0–100.0)
Monocytes Absolute: 0.5 10*3/uL (ref 0.1–1.0)
Monocytes Relative: 3 %
Neutro Abs: 11.3 10*3/uL — ABNORMAL HIGH (ref 1.7–7.7)
Neutrophils Relative %: 80 %
Platelet Count: 241 10*3/uL (ref 150–400)
RBC: 4.27 MIL/uL (ref 3.87–5.11)
RDW: 14.4 % (ref 11.5–15.5)
WBC Count: 14.2 10*3/uL — ABNORMAL HIGH (ref 4.0–10.5)
nRBC: 0 % (ref 0.0–0.2)

## 2021-07-11 MED ORDER — VENLAFAXINE HCL ER 37.5 MG PO CP24: 37.5000 mg | ORAL_CAPSULE | Freq: Every day | ORAL | 4 refills | Status: AC

## 2021-07-12 DIAGNOSIS — D72829 Elevated white blood cell count, unspecified: Secondary | ICD-10-CM | POA: Diagnosis not present

## 2021-07-13 ENCOUNTER — Encounter (HOSPITAL_COMMUNITY): Payer: Self-pay | Admitting: Emergency Medicine

## 2021-07-13 ENCOUNTER — Other Ambulatory Visit: Payer: Self-pay

## 2021-07-13 ENCOUNTER — Ambulatory Visit (HOSPITAL_COMMUNITY)
Admission: EM | Admit: 2021-07-13 | Discharge: 2021-07-13 | Disposition: A | Discharge status: Home / Self Care | Payer: Medicare Other | Attending: Medical Oncology | Admitting: Medical Oncology

## 2021-07-13 DIAGNOSIS — K047 Periapical abscess without sinus: Secondary | ICD-10-CM

## 2021-07-13 DIAGNOSIS — R21 Rash and other nonspecific skin eruption: Secondary | ICD-10-CM | POA: Diagnosis not present

## 2021-07-13 MED ORDER — CLINDAMYCIN HCL 300 MG PO CAPS: 300.0000 mg | ORAL_CAPSULE | Freq: Three times a day (TID) | ORAL | 0 refills | Status: DC

## 2021-07-13 MED ORDER — TRIAMCINOLONE ACETONIDE 0.1 % EX CREA: 1.0000 "application " | TOPICAL_CREAM | Freq: Two times a day (BID) | CUTANEOUS | 0 refills | Status: DC

## 2021-07-13 NOTE — ED Provider Notes (Signed)
[place]    CSN: [id number] Arrival date & time: 07/13/21  1932      History   Chief Complaint Chief Complaint  Patient presents with   skin irritation    HPI Desiree Anderson is a 65 y.o. female.   HPI  Skin Rash: Pt states that last night she noticed a red bump on her left lower leg. Slowly over time this has worsened. Slightly painful and itchy in nature. Few scattered red bumps. She has tried regular lotion and neosporin without success. No fevers, known bites, history MRSA. She also reports a dental abscess of one of her top right molars. Painful to eating and touch. She denies trouble swallowing, breathing or fevers.   Past Medical History:  Diagnosis Date   Anxiety    Breast cancer ([place])    Complication of anesthesia    hypotension   DDD (degenerative disc disease), lumbar    Erosion of vaginal mesh ([facility])    Fibromyalgia    GERD (gastroesophageal reflux disease)    History of gastric ulcer    History of radiation therapy 07/06/16- 08/22/16   Right Breast   Hypertension    Hypothyroidism    Personal history of radiation therapy    Rectocele     Patient Active Problem List   Diagnosis Date Noted   Spinal stenosis of cervical region 07/04/2021   Other spondylosis with radiculopathy, cervical region 03/29/2021   Insomnia 03/25/2018   Situational anxiety 03/25/2018   Encounter for Medicare annual wellness exam 02/19/2017   Tobacco chew use 05/15/2016   Malignant neoplasm of upper-outer quadrant of right breast in female, estrogen receptor positive ([place]) 04/21/2016   Hypothyroidism 04/18/2016   Obesity (BMI 30.0-34.9) 10/11/2015   Medication management 09/24/2014   Cystocele, midline 03/23/2014   Essential hypertension 12/10/2013   Mixed hyperlipidemia 12/10/2013   Prediabetes 12/10/2013   Vitamin D deficiency 12/10/2013   DJD  12/10/2013   DDD, lumbar 12/10/2013   Fibromyalgia Syndrome 12/10/2013    Past Surgical History:  Procedure  Laterality Date   BLADDER TACK  2003   BREAST BIOPSY Right 04/18/2016   BREAST LUMPECTOMY Right 05/18/2016   Breast mass removal Left 1985   Benign   CARDIOVASCULAR STRESS TEST  07-02-2013  DR DALTON MCLEAN   LOW RISK NUCLEAR STUDY WITH A SMALL, MILD APICAL SEPTAL FIXED PERFUSION DEFECT .  GIVEN NORMAL WALL FUNCTION , SUSPECT REPRESENTS ATTENUATION/  EF 74%/  NO ISCHEMIA   CATARACT EXTRACTION W/ INTRAOCULAR LENS IMPLANT Left 01/2014   CATARACT EXTRACTION W/ INTRAOCULAR LENS IMPLANT Right 12/2017   Dr. Manuella Ghazi   CHOLECYSTECTOMY N/A 12/10/2017   Procedure: LAPAROSCOPIC CHOLECYSTECTOMY;  Surgeon: Rolm Bookbinder, MD;  Location: [facility];  Service: General;  Laterality: N/A;   CYSTO/  HYDRODISTENTION/  BLADDER BX/   INSTILLATION THERAPY  08-29-2010   EXCISION OF MESH N/A 03/23/2014   Procedure: EXCISION OF VAGINAL AND PERI-URETHRAL MESH, EXTRACTION FROM URETHRA TO APEX, insertion of XENFORM in the vaginal vault;  Surgeon: Ailene Rud, MD;  Location: [facility];  Service: Urology;  Laterality: N/A;   EXCISION VAGINAL CYST N/A 04/06/2014   Procedure: VAGINAL SPECULUM EXAMINATION/POSSIBLE DRAINAGE OF HEMATOMA;  Surgeon: Ailene Rud, MD;  Location: [facility];  Service: Urology;  Laterality: N/A;   INCONTINENCE SURGERY  2009  &  2005   RADIOACTIVE SEED GUIDED PARTIAL MASTECTOMY WITH AXILLARY SENTINEL LYMPH NODE BIOPSY Right 05/18/2016   Procedure: RADIOACTIVE SEED GUIDED RIGHT BREAST LUMPECTOMY  WITH AXILLARY SENTINEL LYMPH NODE BIOPSY;  Surgeon: Rolm Bookbinder, MD;  Location: [place];  Service: General;  Laterality: Right;  RADIOACTIVE SEED GUIDED RIGHT BREAST LUMPECTOMY WITH AXILLARY SENTINEL LYMPH NODE BIOPSY   TRANSTHORACIC ECHOCARDIOGRAM  06-16-2013   GRADE I DIASTOLIC DYSFUNCTION/  EF 55-60%/  MILD AR/  TRIVIAL MR  &  TR   VAGINAL HYSTERECTOMY  2000   w/  unilateral salpingoophorectomy   VEIN BYPASS SURGERY  AGE 48   RIGHT LEG     OB History   No obstetric history on file.      Home Medications    Prior to Admission medications   Medication Sig Start Date End Date Taking? Authorizing Provider  clindamycin (CLEOCIN) 300 MG capsule Take 1 capsule (300 mg total) by mouth 3 (three) times daily for 7 days. 07/13/21 07/20/21 Yes Linda Biehn, Holli Humbles, PA-C  triamcinolone cream (KENALOG) 0.1 % Apply 1 application topically 2 (two) times daily. 07/13/21  Yes Destynee Stringfellow M, PA-C  ALPRAZolam Duanne Moron) 0.5 MG tablet TAKE 1/2-1 TABLET BY MOUTH 2-3 TIMES DAILY ONLY IF NEEDED FOR ANXIETY ATTACK AND LIMIT TO 5 DAYS A WEEK TO AVOID ADDICTION AND DEMENTIA 04/26/21   Liane Comber, NP  Ascorbic Acid (VITAMIN C) 1000 MG tablet Take 1,000 mg by mouth daily.    [provider]  cetirizine (ZYRTEC) 10 MG tablet Take 10 mg by mouth daily.    [provider]  Cholecalciferol (VITAMIN D-3) 5000 UNITS TABS Take 5,000 Units by mouth 2 (two) times daily.     [provider]  cholestyramine (QUESTRAN) 4 g packet Take 1 packet (4 g total) by mouth 2 (two) times daily. 11/20/19   Levin Erp, PA  dexamethasone (DECADRON) 4 MG tablet Take  1 tab  3 x day  As directed 02/02/21   Unk Pinto, MD  dicyclomine (BENTYL) 10 MG capsule TAKE 1 CAPSULE BY MOUTH 4 TIMES DAILY BEFORE MEAL(S) AND AT BEDTIME 06/01/21   Levin Erp, PA  ezetimibe (ZETIA) 10 MG tablet TAKE 1 TABLET BY MOUTH ONCE DAILY FOR CHOLESTEROL 02/11/21   Liane Comber, NP  famotidine (PEPCID) 40 MG tablet Take 1 tablet (40 mg total) by mouth as needed. 05/04/20 05/04/21  Garnet Sierras, NP  fluticasone (FLONASE) 50 MCG/ACT nasal spray USE TWO SPRAY(S) IN EACH NOSTRIL ONCE DAILY AS NEEDED 03/15/19   Unk Pinto, MD  gabapentin (NEURONTIN) 600 MG tablet TAKE 1/2 TO 1 (ONE-HALF TO ONE) TABLET BY MOUTH 2 TO 3 TIMES DAILY AS  NEEDED  TO  CONTROL  PAIN 11/17/20   Unk Pinto, MD  hyoscyamine (LEVSIN SL) 0.125 MG SL tablet DISSOLVE 1  TABLET IN MOUTH EVERY 6 HOURS AS NEEDED 12/11/19   Doran Stabler, MD  levothyroxine (SYNTHROID) 100 MCG tablet Take  1 tablet  Daily  on an empty stomach with only water  for 30 minutes & no Antacid meds, Calcium or Magnesium for 4 hours & avoid Biotin 03/17/21   Unk Pinto, MD  Multiple Vitamin (MULTIVITAMIN PO) Take 1 tablet by mouth daily. Takes [employer], Historical, MD  phentermine (ADIPEX-P) 37.5 MG tablet Take  1/2 to 1 tablet  every Morning  for Dieting & Weight Loss 02/02/21   Unk Pinto, MD  potassium chloride SA (KLOR-CON) 20 MEQ tablet TAKE 1 TABLET BY MOUTH TWICE DAILY FOR  POTASSIUM 03/02/21   McClanahan, Danton Sewer, NP  rosuvastatin (CRESTOR) 10 MG tablet Take  1 tablet Daily  for  Cholesterol 02/02/21   Unk Pinto, MD  topiramate (TOPAMAX) 50 MG tablet Take  1/2 to 1 tablet  2 x /day  at Suppertime & Bedtime for Dieting & Weight Loss 02/02/21   Unk Pinto, MD  venlafaxine XR (EFFEXOR-XR) 37.5 MG 24 hr capsule Take 1 capsule (37.5 mg total) by mouth daily with breakfast. 07/11/21   Magrinat, Virgie Dad, MD  zinc gluconate 50 MG tablet Take 50 mg by mouth daily.    [provider]    Family History Family History  Problem Relation Age of Onset   Diabetes Mother    Diverticulitis Mother    Lung cancer Brother    Intestinal polyp Sister    Diverticulitis Sister     Social History Social History   Tobacco Use   Smoking status: Former    Years: 20.00    Types: Cigarettes    Quit date: 11/28/1995    Years since quitting: 25.6   Smokeless tobacco: Current    Types: Snuff   Tobacco comments:    dips daily  Vaping Use   Vaping Use: Never used  Substance Use Topics   Alcohol use: No   Drug use: No     Allergies   Iodine, Lipitor [atorvastatin], Shellfish allergy, and Betadine [povidone iodine]   Review of Systems Review of Systems  As stated above in HPI Physical Exam Triage Vital Signs ED Triage Vitals  Enc Vitals Group     BP  07/13/21 2013 (!) 149/85     Pulse Rate 07/13/21 2013 81     Resp 07/13/21 2013 18     Temp 07/13/21 2013 97.8 F (36.6 C)     Temp src --      SpO2 07/13/21 2013 97 %     Weight --      Height --      Head Circumference --      Peak Flow --      Pain Score 07/13/21 2012 9     Pain Loc --      Pain Edu? --      Excl. in [place]? --    No data found.  Updated Vital Signs BP (!) 149/85   Pulse 81   Temp 97.8 F (36.6 C)   Resp 18   SpO2 97%   Physical Exam Vitals and nursing note reviewed.  Constitutional:      Appearance: Normal appearance.  HENT:     Head: Normocephalic and atraumatic.     Mouth/Throat:     Mouth: Mucous membranes are moist.      Comments: No edema, bogginess or tenderness of inferior or superior palate  Cardiovascular:     Rate and Rhythm: Normal rate and regular rhythm.     Heart sounds: Normal heart sounds.  Pulmonary:     Effort: Pulmonary effort is normal.     Breath sounds: Normal breath sounds.  Skin:    General: Skin is warm.     Findings: Rash (see below) present.  Neurological:     Mental Status: She is alert.     UC Treatments / Results  Labs (all labs ordered are listed, but only abnormal results are displayed) Labs Reviewed - No data to display  EKG   Radiology No results found.  Procedures Procedures (including critical care time)  Medications Ordered in UC Medications - No data to display  Initial Impression / Assessment and Plan / UC Course  I have reviewed the triage vital signs  and the nursing notes.  Pertinent labs & imaging results that were available during my care of the patient were reviewed by me and considered in my medical decision making (see chart for details).     New. Cellulitis and dental abscess. Treating with clindamycin and triamcinolone for surrounding erythema which may be inflammatory in nature. Discussed how to take along with common potential side effects and precautions. Follow up with  dentist within 14 days.    Final Clinical Impressions(s) / UC Diagnoses   Final diagnoses:  Rash and nonspecific skin eruption  Dental abscess   Discharge Instructions   None    ED Prescriptions     Medication Sig Dispense Auth. Provider   clindamycin (CLEOCIN) 300 MG capsule Take 1 capsule (300 mg total) by mouth 3 (three) times daily for 7 days. 21 capsule Xiana Carns M, PA-C   triamcinolone cream (KENALOG) 0.1 % Apply 1 application topically 2 (two) times daily. 30 g Maritza Hosterman M, [place]      PDMP not reviewed this encounter.   Hughie Closs, [place] 07/13/21 2036

## 2021-07-13 NOTE — ED Triage Notes (Signed)
Pt is present with a rash on her left lower leg. Pt states that since this morning the rash has gotten bigger and she describes the pain as burning.

## 2021-07-18 NOTE — Progress Notes (Signed)
FOLLOW UP  Assessment and Plan:  Desiree Anderson was seen today for follow up of rash, hypertension, migraines and hypothyroidism.  Diagnoses and all orders for this visit:  Rash and nonspecific skin eruption -     CBC with Differential/Platelet -     Sedimentation rate; Future -     C-reactive protein -     ANA -     predniSONE (DELTASONE) 20 MG tablet; 1 pill 3 x a day for 3 days, 1 pill 2 x a day x 3 days, 1 pill a day x 5 days with food - Diflucan '150mg'$  day 1 and day 4 to take for possible fungal infection with rash - If symptoms are not resolving she is to call the office  Migraine with vertigo -     Sedimentation rate; Future -     C-reactive protein -     ANA -     rizatriptan (MAXALT) 10 MG tablet; Take 1 tablet (10 mg total) by mouth once as needed for migraine. May repeat in 2 hours if needed -     meclizine (ANTIVERT) 25 MG tablet; 1/2-1 pill up to 3 times daily for motion sickness/dizziness - Discussed importance of regular diet and exercise, if medications are not relieving headaches she is to call the office  Leukocytosis, unspecified type    - Repeat CBC  Essential hypertension  - Will start on HCTZ , take blood pressure at home and keep log. Return in 4 weeks for blood pressure recheck - DASH diet, exercise and monitor at home. Call if greater than 130/80.    Hypothyroidism, unspecified type       Continue Levothyroxine, take first thing in AM do not eat or drink for 30 minutes.  Avoid other medication for 30 mintues    Further disposition pending results of labs. Discussed med's effects and SE's.   Over 30 minutes of exam, counseling, chart review, and critical decision making was performed.   Future Appointments  Date Time Provider [place]  08/09/2021 10:15 AM [name], MD [facility] None  08/16/2021 10:30 AM Magda Bernheim, NP [facility] None  08/31/2021 11:30 AM Liane Comber, NP [facility] None  02/09/2022  3:00 PM Unk Pinto, MD [facility] None     ------------------------------------------------------------------------------------------------------------------   HPI BP (!) 160/102   Pulse 94   Temp (!) 97.2 F (36.2 C)   Wt 185 lb 3.2 oz (84 kg)   SpO2 99%   BMI 33.07 kg/m  BP recheck 150/90 65 y.o.female presents for red area noticed on left lower leg 2 weeks ago , redness increased to larger area on leg. Purpura noticed on left inner ankle which are itchy. Hands are now burning and itching.   Wakes up with a headache that stays consistent in pain, pain medication such as Tylenol and Advil do not relieve.  Took Zomig in the past that did help.  Develops vertigo along with the headaches.   Pt states her blood pressure has been running in the 150's/90's the past several doctor apoointments and at home.   BP Readings from Last 3 Encounters:  07/19/21 (!) 160/102  07/13/21 (!) 149/85  07/11/21 (!) 158/79    She had an elevated white count at the urgent care Lab Results  Component Value Date   WBC 14.2 (H) 07/11/2021   HGB 12.1 07/11/2021   HCT 36.1 07/11/2021   MCV 84.5 07/11/2021   PLT 241 07/11/2021    Past Medical History:  Diagnosis Date  Anxiety    Breast cancer ([place])    Complication of anesthesia    hypotension   DDD (degenerative disc disease), lumbar    Erosion of vaginal mesh ([facility])    Fibromyalgia    GERD (gastroesophageal reflux disease)    History of gastric ulcer    History of radiation therapy 07/06/16- 08/22/16   Right Breast   Hypertension    Hypothyroidism    Personal history of radiation therapy    Rectocele      Allergies  Allergen Reactions   Iodine    Lipitor [Atorvastatin] Other (See Comments)    Muscle and legs aches   Shellfish Allergy Diarrhea and Nausea And Vomiting   Betadine [Povidone Iodine] Rash    Current Outpatient Medications on File Prior to Visit  Medication Sig   Acetaminophen (TYLENOL) 325 MG CAPS Take by mouth.   ALPRAZolam (XANAX) 0.5 MG tablet TAKE 1/2-1  TABLET BY MOUTH 2-3 TIMES DAILY ONLY IF NEEDED FOR ANXIETY ATTACK AND LIMIT TO 5 DAYS A WEEK TO AVOID ADDICTION AND DEMENTIA   Ascorbic Acid (VITAMIN C) 1000 MG tablet Take 1,000 mg by mouth daily.   cetirizine (ZYRTEC) 10 MG tablet Take 10 mg by mouth daily.   Cholecalciferol (VITAMIN D-3) 5000 UNITS TABS Take 5,000 Units by mouth 2 (two) times daily.    clindamycin (CLEOCIN) 300 MG capsule Take 1 capsule (300 mg total) by mouth 3 (three) times daily for 7 days.   co-enzyme Q-10 30 MG capsule Take 30 mg by mouth 3 (three) times daily.   dicyclomine (BENTYL) 10 MG capsule TAKE 1 CAPSULE BY MOUTH 4 TIMES DAILY BEFORE MEAL(S) AND AT BEDTIME   ezetimibe (ZETIA) 10 MG tablet TAKE 1 TABLET BY MOUTH ONCE DAILY FOR CHOLESTEROL   fluticasone (FLONASE) 50 MCG/ACT nasal spray USE TWO SPRAY(S) IN EACH NOSTRIL ONCE DAILY AS NEEDED   gabapentin (NEURONTIN) 600 MG tablet TAKE 1/2 TO 1 (ONE-HALF TO ONE) TABLET BY MOUTH 2 TO 3 TIMES DAILY AS  NEEDED  TO  CONTROL  PAIN   hyoscyamine (LEVSIN SL) 0.125 MG SL tablet DISSOLVE 1 TABLET IN MOUTH EVERY 6 HOURS AS NEEDED   levothyroxine (SYNTHROID) 100 MCG tablet Take  1 tablet  Daily  on an empty stomach with only water  for 30 minutes & no Antacid meds, Calcium or Magnesium for 4 hours & avoid Biotin   potassium chloride SA (KLOR-CON) 20 MEQ tablet TAKE 1 TABLET BY MOUTH TWICE DAILY FOR  POTASSIUM   pseudoephedrine (SUDAFED) 30 MG tablet Take 30 mg by mouth every 4 (four) hours as needed for congestion.   rosuvastatin (CRESTOR) 10 MG tablet Take  1 tablet Daily  for Cholesterol   topiramate (TOPAMAX) 50 MG tablet Take  1/2 to 1 tablet  2 x /day  at Suppertime & Bedtime for Dieting & Weight Loss   triamcinolone cream (KENALOG) 0.1 % Apply 1 application topically 2 (two) times daily.   venlafaxine XR (EFFEXOR-XR) 37.5 MG 24 hr capsule Take 1 capsule (37.5 mg total) by mouth daily with breakfast.   zinc gluconate 50 MG tablet Take 50 mg by mouth daily.   cholestyramine  (QUESTRAN) 4 g packet Take 1 packet (4 g total) by mouth 2 (two) times daily.   famotidine (PEPCID) 40 MG tablet Take 1 tablet (40 mg total) by mouth as needed.   Multiple Vitamin (MULTIVITAMIN PO) Take 1 tablet by mouth daily. Takes Geritabs (Patient not taking: Reported on 07/19/2021)   phentermine (ADIPEX-P) 37.5  MG tablet Take  1/2 to 1 tablet  every Morning  for Dieting & Weight Loss (Patient not taking: Reported on 07/19/2021)   No current facility-administered medications on file prior to visit.   Review of Systems  Constitutional:  Negative for chills, fever and weight loss.  HENT:  Negative for congestion and hearing loss.   Eyes:  Positive for blurred vision. Negative for double vision.  Respiratory:  Negative for cough and shortness of breath.   Cardiovascular:  Negative for chest pain, palpitations, orthopnea and leg swelling.  Gastrointestinal:  Negative for abdominal pain, constipation, diarrhea, heartburn, nausea and vomiting.  Musculoskeletal:  Negative for falls, joint pain and myalgias.  Skin:  Positive for rash.  Neurological:  Positive for dizziness and headaches. Negative for tingling, tremors and loss of consciousness.  Psychiatric/Behavioral:  Negative for depression, memory loss and suicidal ideas.     Physical Exam:  BP (!) 160/102   Pulse 94   Temp (!) 97.2 F (36.2 C)   Wt 185 lb 3.2 oz (84 kg)   SpO2 99%   BMI 33.07 kg/m   General Appearance: Well nourished, in no apparent distress. Eyes: PERRLA, EOMs, conjunctiva no swelling or erythema Sinuses: No Frontal/maxillary tenderness ENT/Mouth: Ext aud canals clear, TMs without erythema, bulging. No erythema, swelling, or exudate on post pharynx.  Tonsils not swollen or erythematous. Hearing normal.  Neck: Supple, thyroid normal.  Respiratory: Respiratory effort normal, BS equal bilaterally without rales, rhonchi, wheezing or stridor.  Cardio: RRR with no MRGs. Brisk peripheral pulses without edema.  Abdomen:  Soft, + BS.  Non tender, no guarding, rebound, hernias, masses. Lymphatics: Non tender without lymphadenopathy.  Musculoskeletal: Full ROM, 5/5 strength, normal gait.  Skin: Warm, dry without rashes, lesions, ecchymosis.  Neuro: Cranial nerves intact. Normal muscle tone, no cerebellar symptoms. Sensation intact.  Psych: Awake and oriented X 3, normal affect, Insight and Judgment appropriate.     Magda Bernheim, NP 12:33 PM [facility]

## 2021-07-19 ENCOUNTER — Encounter: Payer: Self-pay | Admitting: Nurse Practitioner

## 2021-07-19 ENCOUNTER — Other Ambulatory Visit: Payer: Self-pay

## 2021-07-19 ENCOUNTER — Ambulatory Visit (INDEPENDENT_AMBULATORY_CARE_PROVIDER_SITE_OTHER): Payer: Medicare Other | Admitting: Nurse Practitioner

## 2021-07-19 VITALS — BP 160/102 | HR 94 | Temp 97.2°F | Wt 185.2 lb

## 2021-07-19 DIAGNOSIS — E039 Hypothyroidism, unspecified: Secondary | ICD-10-CM

## 2021-07-19 DIAGNOSIS — G43109 Migraine with aura, not intractable, without status migrainosus: Secondary | ICD-10-CM | POA: Diagnosis not present

## 2021-07-19 DIAGNOSIS — I10 Essential (primary) hypertension: Secondary | ICD-10-CM

## 2021-07-19 DIAGNOSIS — R21 Rash and other nonspecific skin eruption: Secondary | ICD-10-CM

## 2021-07-19 DIAGNOSIS — D72829 Elevated white blood cell count, unspecified: Secondary | ICD-10-CM | POA: Diagnosis not present

## 2021-07-19 MED ORDER — MECLIZINE HCL 25 MG PO TABS: ORAL_TABLET | ORAL | 0 refills | Status: DC

## 2021-07-19 MED ORDER — PREDNISONE 20 MG PO TABS: ORAL_TABLET | ORAL | 0 refills | Status: DC

## 2021-07-19 MED ORDER — FLUCONAZOLE 150 MG PO TABS: ORAL_TABLET | ORAL | 0 refills | Status: DC

## 2021-07-19 MED ORDER — RIZATRIPTAN BENZOATE 10 MG PO TABS: 10.0000 mg | ORAL_TABLET | Freq: Once | ORAL | 0 refills | Status: DC | PRN

## 2021-07-19 MED ORDER — HYOSCYAMINE SULFATE 0.125 MG SL SUBL: SUBLINGUAL_TABLET | SUBLINGUAL | 0 refills | Status: DC

## 2021-07-19 MED ORDER — HYDROCHLOROTHIAZIDE 25 MG PO TABS: 25.0000 mg | ORAL_TABLET | Freq: Every day | ORAL | 3 refills | Status: DC

## 2021-07-19 NOTE — Patient Instructions (Signed)
Rizatriptan Tablets What is this medication? RIZATRIPTAN (rye za TRIP tan) treats migraines. It works by blocking pain signals and narrowing blood vessels in the brain. It belongs to a group ofmedications called triptans. It is not used to prevent migraines. This medicine may be used for other purposes; ask your health care provider orpharmacist if you have questions. COMMON BRAND NAME(S): Maxalt What should I tell my care team before I take this medication? They need to know if you have any of these conditions: Cigarette smoker Circulation problems in fingers and toes Diabetes Heart disease High blood pressure High cholesterol History of irregular heartbeat History of stroke Kidney disease Liver disease Stomach or intestine problems An unusual or allergic reaction to rizatriptan, other medications, foods, dyes, or preservatives Pregnant or trying to get pregnant Breast-feeding How should I use this medication? Take this medication by mouth with a glass of water. Follow the directions onthe prescription label. Do not take it more often than directed. Talk to your care team regarding the use of this medication in children. While this medication may be prescribed for children as young as 6 years for selectedconditions, precautions do apply. Overdosage: If you think you have taken too much of this medicine contact apoison control center or emergency room at once. NOTE: This medicine is only for you. Do not share this medicine with others. What if I miss a dose? This does not apply. This medication is not for regular use. What may interact with this medication? Do not take this medication with any of the following: Certain medications for migraine headache like almotriptan, eletriptan, frovatriptan, naratriptan, rizatriptan, sumatriptan, zolmitriptan Ergot alkaloids like dihydroergotamine, ergonovine, ergotamine, methylergonovine MAOIs like Carbex, Eldepryl, Marplan, Nardil, and  Parnate This medication may also interact with the following: Certain medications for depression, anxiety, or psychotic disorders Propranolol This list may not describe all possible interactions. Give your health care provider a list of all the medicines, herbs, non-prescription drugs, or dietary supplements you use. Also tell them if you smoke, drink alcohol, or use illegaldrugs. Some items may interact with your medicine. What should I watch for while using this medication? Visit your care team for regular checks on your progress. Tell your care teamif your symptoms do not start to get better or if they get worse. You may get drowsy or dizzy. Do not drive, use machinery, or do anything that needs mental alertness until you know how this medication affects you. Do not stand up or sit up quickly, especially if you are an older patient. This reduces the risk of dizzy or fainting spells. Alcohol may interfere with theeffect of this medication. Your mouth may get dry. Chewing sugarless gum or sucking hard candy and drinking plenty of water may help. Contact your care team if the problem doesnot go away or is severe. If you take migraine medications for 10 or more days a month, your migraines may get worse. Keep a diary of headache days and medication use. Contact yourcare team if your migraine attacks occur more frequently. What side effects may I notice from receiving this medication? Side effects that you should report to your care team as soon as possible: Allergic reactions-skin rash, itching, hives, swelling of the face, lips, tongue, or throat Burning, pain, tingling, or color changes in the legs or feet Heart attack-pain or tightness in the chest, shoulders, arms, or jaw, nausea, shortness of breath, cold or clammy skin, feeling faint or lightheaded Heart rhythm changes-fast or irregular heartbeat, dizziness, feeling faint or  lightheaded, chest pain, trouble breathing Increase in blood  pressure Irritability, confusion, fast or irregular heartbeat, muscle stiffness, twitching muscles, sweating, high fever, seizure, chills, vomiting, diarrhea, which may be signs of serotonin syndrome Raynaud's-cool, numb, or painful fingers or toes that may change color from pale, to blue, to red Seizures Stroke-sudden numbness or weakness of the face, arm, or leg, trouble speaking, confusion, trouble walking, loss of balance or coordination, dizziness, severe headache, change in vision Sudden or severe stomach pain, nausea, vomiting, fever, or bloody diarrhea Vision loss Side effects that usually do not require medical attention (report to your careteam if they continue or are bothersome): Dizziness General discomfort or fatigue This list may not describe all possible side effects. Call your doctor for medical advice about side effects. You may report side effects to FDA at1-800-FDA-1088. Where should I keep my medication? Keep out of the reach of children and pets. Store at room temperature between 15 and 30 degrees C (59 and 86 degrees F). Keep container tightly closed. Throw away any unused medication after theexpiration date. NOTE: This sheet is a summary. It may not cover all possible information. If you have questions about this medicine, talk to your doctor, pharmacist, orhealth care provider.  2022 Elsevier/Gold Standard (2020-12-09 12:30:25) Vertigo Vertigo is the feeling that you or your surroundings are moving when they are not. This feeling can come and go at any time. Vertigo often goes away on its own. Vertigo can be dangerous if it occurs while you are doing something thatcould endanger yourself or others, such as driving or operating machinery. Your health care provider will do tests to try to determine the cause of your vertigo. Tests will also help your health care provider decide how best totreat your condition. Follow these instructions at home: Eating and drinking      Dehydration can make vertigo worse. Drink enough fluid to keep your urine pale yellow. Do not drink alcohol. Activity Return to your normal activities as told by your health care provider. Ask your health care provider what activities are safe for you. In the morning, first sit up on the side of the bed. When you feel okay, stand slowly while you hold onto something until you know that your balance is fine. Move slowly. Avoid sudden body or head movements or certain positions, as told by your health care provider. If you have trouble walking or keeping your balance, try using a cane for stability. If you feel dizzy or unstable, sit down right away. Avoid doing any tasks that would cause danger to you or others if vertigo occurs. Avoid bending down if you feel dizzy. Place items in your home so that they are easy for you to reach without bending or leaning over. Do not drive or use machinery if you feel dizzy. General instructions Take over-the-counter and prescription medicines only as told by your health care provider. Keep all follow-up visits. This is important. Contact a health care provider if: Your medicines do not relieve your vertigo or they make it worse. Your condition gets worse or you develop new symptoms. You have a fever. You develop nausea or vomiting, or if nausea gets worse. Your family or friends notice any behavioral changes. You have numbness or a prickling and tingling sensation in part of your body. Get help right away if you: Are always dizzy or you faint. Develop severe headaches. Develop a stiff neck. Develop sensitivity to light. Have difficulty moving or speaking. Have weakness in your hands,  arms, or legs. Have changes in your hearing or vision. These symptoms may represent a serious problem that is an emergency. Do not wait to see if the symptoms will go away. Get medical help right away. Call your local emergency services (911 in the [place] Do not drive  yourself to the hospital. Summary Vertigo is the feeling that you or your surroundings are moving when they are not. Your health care provider will do tests to try to determine the cause of your vertigo. Follow instructions for home care. You may be told to avoid certain tasks, positions, or movements. Contact a health care provider if your medicines do not relieve your symptoms, or if you have a fever, nausea, vomiting, or changes in behavior. Get help right away if you have severe headaches or difficulty speaking, or you develop hearing or vision problems. This information is not intended to replace advice given to you by your health care provider. Make sure you discuss any questions you have with your healthcare provider. Document Revised: 10/13/2020 Document Reviewed: 10/13/2020 Elsevier Patient Education  2022 [employer].

## 2021-07-20 ENCOUNTER — Other Ambulatory Visit: Payer: Self-pay | Admitting: Nurse Practitioner

## 2021-07-20 DIAGNOSIS — R21 Rash and other nonspecific skin eruption: Secondary | ICD-10-CM

## 2021-07-20 LAB — CBC WITH DIFFERENTIAL/PLATELET
Absolute Monocytes: 396 cells/uL (ref 200–950)
Basophils Absolute: 22 cells/uL (ref 0–200)
Basophils Relative: 0.2 %
Eosinophils Absolute: 0 cells/uL — ABNORMAL LOW (ref 15–500)
Eosinophils Relative: 0 %
HCT: 45.2 % — ABNORMAL HIGH (ref 35.0–45.0)
Hemoglobin: 14.4 g/dL (ref 11.7–15.5)
Lymphs Abs: 2057 cells/uL (ref 850–3900)
MCH: 27.7 pg (ref 27.0–33.0)
MCHC: 31.9 g/dL — ABNORMAL LOW (ref 32.0–36.0)
MCV: 86.9 fL (ref 80.0–100.0)
MPV: 10.3 fL (ref 7.5–12.5)
Monocytes Relative: 3.6 %
Neutro Abs: 8525 cells/uL — ABNORMAL HIGH (ref 1500–7800)
Neutrophils Relative %: 77.5 %
Platelets: 291 10*3/uL (ref 140–400)
RBC: 5.2 10*6/uL — ABNORMAL HIGH (ref 3.80–5.10)
RDW: 13.5 % (ref 11.0–15.0)
Total Lymphocyte: 18.7 %
WBC: 11 10*3/uL — ABNORMAL HIGH (ref 3.8–10.8)

## 2021-07-20 LAB — ANA: Anti Nuclear Antibody (ANA): NEGATIVE

## 2021-07-20 LAB — C-REACTIVE PROTEIN: CRP: 3 mg/L (ref <8.0)

## 2021-07-20 MED ORDER — DOXYCYCLINE HYCLATE 100 MG PO CAPS: 100.0000 mg | ORAL_CAPSULE | Freq: Two times a day (BID) | ORAL | 0 refills | Status: AC

## 2021-07-20 NOTE — Progress Notes (Signed)
Symptoms of skin rash have worsened and now has blisters.  CBC consistent with bacterial infection.  Advised to stop Cleocin and Prednisone.  She is to call Friday if symptoms are not improving.

## 2021-07-22 ENCOUNTER — Telehealth: Payer: Self-pay

## 2021-07-22 NOTE — Telephone Encounter (Signed)
Patient states the rash on her feet has spread and her hands seem to be improving. Her B/P this morning was 158/87 and her headache has calmed down some.

## 2021-07-22 NOTE — Telephone Encounter (Signed)
Continue doxycycline and HCTZ and journal blood pressures.  If not improved early next week will schedule appointment to reevaluate

## 2021-07-25 NOTE — Progress Notes (Signed)
FOLLOW UP  Assessment and Plan:   Desiree Anderson was seen today for hypertension.  Diagnoses and all orders for this visit:  Essential hypertension -     losartan (COZAAR) 50 MG tablet; Take 1 tablet (50 mg total) by mouth daily. -     CBC with Differential/Platelet -     COMPLETE METABOLIC PANEL WITH GFR - - continue medications, DASH diet, exercise and monitor at home. Call if greater than 130/80.    Rash and nonspecific skin eruption      - Finish course of doxycyline, rash is greatly improved  Migraine with vertigo      - Continue Topamax daily and Maxalt PRN- pt states medications are improving symptoms dramatically  Leukocytosis, Unspecified       - Repeat CBC today  Continue diet and meds as discussed. Further disposition pending results of labs. Discussed med's effects and SE's.   Over 30 minutes of exam, counseling, chart review, and critical decision making was performed.   Future Appointments  Date Time Provider [place]  08/05/2021  7:10 AM [facility]  08/16/2021 10:30 AM Magda Bernheim, NP [facility] None  08/17/2021 10:15 AM [name], MD [facility] None  08/31/2021 11:00 AM Magda Bernheim, NP [facility] None  02/09/2022  3:00 PM Unk Pinto, MD [facility] None    ----------------------------------------------------------------------------------------------------------------------  HPI 65 y.o. female  presents for 3 month follow up on hypertension, cholesterol, diabetes, weight and vitamin D deficiency.   BMI is Body mass index is 32.68 kg/m., she has been working on diet and exercise. Wt Readings from Last 3 Encounters:  07/26/21 183 lb (83 kg)  07/19/21 185 lb 3.2 oz (84 kg)  07/11/21 187 lb 4.8 oz (85 kg)    Her blood pressure has not been controlled at home running in 150-160's/90-100's, today their BP is BP: (!) 150/88  She does not workout. She denies chest pain, shortness of breath, dizziness. BP Readings from Last 3  Encounters:  07/26/21 (!) 150/88  07/19/21 (!) 160/102  07/13/21 (!) 149/85   Migraines have been much better controlled with use of Topamax and Maxalt PRN.  Has only had to use Maxalt 2 times but provided relief.    Leukocytosis- will repeat CBC today, is finishing course of doxycycline for rash which is resolving.  Has appt tomorrow with dentist for dental abscess.      Current Medications:  Current Outpatient Medications on File Prior to Visit  Medication Sig   Acetaminophen (TYLENOL) 325 MG CAPS Take by mouth.   ALPRAZolam (XANAX) 0.5 MG tablet TAKE 1/2-1 TABLET BY MOUTH 2-3 TIMES DAILY ONLY IF NEEDED FOR ANXIETY ATTACK AND LIMIT TO 5 DAYS A WEEK TO AVOID ADDICTION AND DEMENTIA   Ascorbic Acid (VITAMIN C) 1000 MG tablet Take 1,000 mg by mouth daily.   cetirizine (ZYRTEC) 10 MG tablet Take 10 mg by mouth daily.   Cholecalciferol (VITAMIN D-3) 5000 UNITS TABS Take 5,000 Units by mouth 2 (two) times daily.    cholestyramine (QUESTRAN) 4 g packet Take 1 packet (4 g total) by mouth 2 (two) times daily.   co-enzyme Q-10 30 MG capsule Take 30 mg by mouth 3 (three) times daily.   dicyclomine (BENTYL) 10 MG capsule TAKE 1 CAPSULE BY MOUTH 4 TIMES DAILY BEFORE MEAL(S) AND AT BEDTIME   doxycycline (VIBRAMYCIN) 100 MG capsule Take 1 capsule (100 mg total) by mouth 2 (two) times daily for 7 days.   ezetimibe (ZETIA) 10  MG tablet TAKE 1 TABLET BY MOUTH ONCE DAILY FOR CHOLESTEROL   fluconazole (DIFLUCAN) 150 MG tablet Take day 1 and day 4   fluticasone (FLONASE) 50 MCG/ACT nasal spray USE TWO SPRAY(S) IN EACH NOSTRIL ONCE DAILY AS NEEDED   gabapentin (NEURONTIN) 600 MG tablet TAKE 1/2 TO 1 (ONE-HALF TO ONE) TABLET BY MOUTH 2 TO 3 TIMES DAILY AS  NEEDED  TO  CONTROL  PAIN   hydrochlorothiazide (HYDRODIURIL) 25 MG tablet Take 1 tablet (25 mg total) by mouth daily.   hyoscyamine (LEVSIN SL) 0.125 MG SL tablet DISSOLVE 1 TABLET IN MOUTH EVERY 6 HOURS AS NEEDED   levothyroxine (SYNTHROID) 100 MCG tablet  Take  1 tablet  Daily  on an empty stomach with only water  for 30 minutes & no Antacid meds, Calcium or Magnesium for 4 hours & avoid Biotin   meclizine (ANTIVERT) 25 MG tablet 1/2-1 pill up to 3 times daily for motion sickness/dizziness   Multiple Vitamin (MULTIVITAMIN PO) Take 1 tablet by mouth daily. Takes Geritabs   phentermine (ADIPEX-P) 37.5 MG tablet Take  1/2 to 1 tablet  every Morning  for Dieting & Weight Loss (Patient taking differently: Take  1/2 to 1 tablet  every Morning  for Dieting & Weight Loss)   potassium chloride SA (KLOR-CON) 20 MEQ tablet TAKE 1 TABLET BY MOUTH TWICE DAILY FOR  POTASSIUM   pseudoephedrine (SUDAFED) 30 MG tablet Take 30 mg by mouth every 4 (four) hours as needed for congestion.   rizatriptan (MAXALT) 10 MG tablet Take 1 tablet (10 mg total) by mouth once as needed for migraine. May repeat in 2 hours if needed   rosuvastatin (CRESTOR) 10 MG tablet Take  1 tablet Daily  for Cholesterol   topiramate (TOPAMAX) 50 MG tablet Take  1/2 to 1 tablet  2 x /day  at Suppertime & Bedtime for Dieting & Weight Loss   triamcinolone cream (KENALOG) 0.1 % Apply 1 application topically 2 (two) times daily.   venlafaxine XR (EFFEXOR-XR) 37.5 MG 24 hr capsule Take 1 capsule (37.5 mg total) by mouth daily with breakfast.   zinc gluconate 50 MG tablet Take 50 mg by mouth daily.   famotidine (PEPCID) 40 MG tablet Take 1 tablet (40 mg total) by mouth as needed.   No current facility-administered medications on file prior to visit.     Allergies:  Allergies  Allergen Reactions   Iodine    Lipitor [Atorvastatin] Other (See Comments)    Muscle and legs aches   Shellfish Allergy Diarrhea and Nausea And Vomiting   Betadine [Povidone Iodine] Rash     Medical History:  Past Medical History:  Diagnosis Date   Anxiety    Breast cancer ([place])    Complication of anesthesia    hypotension   DDD (degenerative disc disease), lumbar    Erosion of vaginal mesh ([facility])    Fibromyalgia     GERD (gastroesophageal reflux disease)    History of gastric ulcer    History of radiation therapy 07/06/16- 08/22/16   Right Breast   Hypertension    Hypothyroidism    Personal history of radiation therapy    Rectocele    Family history- Reviewed and unchanged Social history- Reviewed and unchanged   Review of Systems:  Review of Systems  Constitutional:  Negative for chills, fever and weight loss.  HENT:  Negative for congestion and hearing loss.   Eyes:  Negative for blurred vision and double vision.  Respiratory:  Negative  for cough and shortness of breath.   Cardiovascular:  Negative for chest pain, palpitations, orthopnea and leg swelling.  Gastrointestinal:  Negative for abdominal pain, constipation, diarrhea, heartburn, nausea and vomiting.  Musculoskeletal:  Negative for falls, joint pain and myalgias.  Skin:  Negative for rash.  Neurological:  Positive for headaches (intermittent controlled with medication). Negative for dizziness, tingling, tremors and loss of consciousness.  Psychiatric/Behavioral:  Negative for depression, memory loss and suicidal ideas.      Physical Exam: BP (!) 150/88   Pulse 94   Temp (!) 97.3 F (36.3 C)   Wt 183 lb (83 kg)   SpO2 97%   BMI 32.68 kg/m  Wt Readings from Last 3 Encounters:  07/26/21 183 lb (83 kg)  07/19/21 185 lb 3.2 oz (84 kg)  07/11/21 187 lb 4.8 oz (85 kg)   General Appearance: Well nourished, in no apparent distress. Eyes: PERRLA, EOMs, conjunctiva no swelling or erythema Sinuses: No Frontal/maxillary tenderness ENT/Mouth: Ext aud canals clear, TMs without erythema, bulging. No erythema, swelling, or exudate on post pharynx.  Tonsils not swollen or erythematous. Hearing normal.  Neck: Supple, thyroid normal.  Respiratory: Respiratory effort normal, BS equal bilaterally without rales, rhonchi, wheezing or stridor.  Cardio: RRR with no MRGs. Brisk peripheral pulses without edema.  Abdomen: Soft, + BS.  Non tender,  no guarding, rebound, hernias, masses. Lymphatics: Non tender without lymphadenopathy.  Musculoskeletal: Full ROM, 5/5 strength, Normal gait Skin: Warm, dry without rashes, lesions, ecchymosis.  Neuro: Cranial nerves intact. No cerebellar symptoms.  Psych: Awake and oriented X 3, normal affect, Insight and Judgment appropriate.    Magda Bernheim, NP 12:02 PM [facility]

## 2021-07-26 ENCOUNTER — Ambulatory Visit (INDEPENDENT_AMBULATORY_CARE_PROVIDER_SITE_OTHER): Payer: Medicare Other | Admitting: Nurse Practitioner

## 2021-07-26 ENCOUNTER — Other Ambulatory Visit: Payer: Self-pay

## 2021-07-26 ENCOUNTER — Encounter: Payer: Self-pay | Admitting: Nurse Practitioner

## 2021-07-26 VITALS — BP 150/88 | HR 94 | Temp 97.3°F | Wt 183.0 lb

## 2021-07-26 DIAGNOSIS — D72829 Elevated white blood cell count, unspecified: Secondary | ICD-10-CM

## 2021-07-26 DIAGNOSIS — I10 Essential (primary) hypertension: Secondary | ICD-10-CM

## 2021-07-26 DIAGNOSIS — R21 Rash and other nonspecific skin eruption: Secondary | ICD-10-CM | POA: Diagnosis not present

## 2021-07-26 DIAGNOSIS — G43109 Migraine with aura, not intractable, without status migrainosus: Secondary | ICD-10-CM

## 2021-07-26 DIAGNOSIS — E782 Mixed hyperlipidemia: Secondary | ICD-10-CM

## 2021-07-26 DIAGNOSIS — R7309 Other abnormal glucose: Secondary | ICD-10-CM

## 2021-07-26 MED ORDER — LOSARTAN POTASSIUM 50 MG PO TABS: 50.0000 mg | ORAL_TABLET | Freq: Every day | ORAL | 0 refills | Status: DC

## 2021-07-27 LAB — CBC WITH DIFFERENTIAL/PLATELET
Absolute Monocytes: 583 cells/uL (ref 200–950)
Basophils Absolute: 47 cells/uL (ref 0–200)
Basophils Relative: 0.5 %
Eosinophils Absolute: 56 cells/uL (ref 15–500)
Eosinophils Relative: 0.6 %
HCT: 42.4 % (ref 35.0–45.0)
Hemoglobin: 13.9 g/dL (ref 11.7–15.5)
Lymphs Abs: 3798 cells/uL (ref 850–3900)
MCH: 28.3 pg (ref 27.0–33.0)
MCHC: 32.8 g/dL (ref 32.0–36.0)
MCV: 86.4 fL (ref 80.0–100.0)
MPV: 10.6 fL (ref 7.5–12.5)
Monocytes Relative: 6.2 %
Neutro Abs: 4916 cells/uL (ref 1500–7800)
Neutrophils Relative %: 52.3 %
Platelets: 267 10*3/uL (ref 140–400)
RBC: 4.91 10*6/uL (ref 3.80–5.10)
RDW: 13.1 % (ref 11.0–15.0)
Total Lymphocyte: 40.4 %
WBC: 9.4 10*3/uL (ref 3.8–10.8)

## 2021-07-27 LAB — COMPLETE METABOLIC PANEL WITH GFR
AG Ratio: 1.7 (calc) (ref 1.0–2.5)
ALT: 42 U/L — ABNORMAL HIGH (ref 6–29)
AST: 41 U/L — ABNORMAL HIGH (ref 10–35)
Albumin: 3.8 g/dL (ref 3.6–5.1)
Alkaline phosphatase (APISO): 70 U/L (ref 37–153)
BUN: 9 mg/dL (ref 7–25)
CO2: 25 mmol/L (ref 20–32)
Calcium: 8.8 mg/dL (ref 8.6–10.4)
Chloride: 101 mmol/L (ref 98–110)
Creat: 0.65 mg/dL (ref 0.50–1.05)
Globulin: 2.3 g/dL (calc) (ref 1.9–3.7)
Glucose, Bld: 149 mg/dL — ABNORMAL HIGH (ref 65–99)
Potassium: 3.5 mmol/L (ref 3.5–5.3)
Sodium: 138 mmol/L (ref 135–146)
Total Bilirubin: 0.4 mg/dL (ref 0.2–1.2)
Total Protein: 6.1 g/dL (ref 6.1–8.1)
eGFR: 99 mL/min/{1.73_m2} (ref >=60)

## 2021-08-05 ENCOUNTER — Ambulatory Visit
Admission: RE | Admit: 2021-08-05 | Discharge: 2021-08-05 | Disposition: A | Discharge status: Home / Self Care | Payer: Medicare Other | Source: Ambulatory Visit | Attending: Oncology | Admitting: Oncology

## 2021-08-05 ENCOUNTER — Other Ambulatory Visit: Payer: Self-pay

## 2021-08-05 DIAGNOSIS — C50411 Malignant neoplasm of upper-outer quadrant of right female breast: Secondary | ICD-10-CM

## 2021-08-05 DIAGNOSIS — Z1231 Encounter for screening mammogram for malignant neoplasm of breast: Secondary | ICD-10-CM | POA: Diagnosis not present

## 2021-08-05 DIAGNOSIS — Z170 Estrogen receptor positive status [ER+]: Secondary | ICD-10-CM

## 2021-08-07 ENCOUNTER — Other Ambulatory Visit: Payer: Self-pay | Admitting: Internal Medicine

## 2021-08-07 DIAGNOSIS — E782 Mixed hyperlipidemia: Secondary | ICD-10-CM

## 2021-08-09 ENCOUNTER — Other Ambulatory Visit: Payer: Self-pay | Admitting: Oncology

## 2021-08-09 ENCOUNTER — Ambulatory Visit: Payer: Medicare Other | Admitting: Orthopaedic Surgery

## 2021-08-09 DIAGNOSIS — R928 Other abnormal and inconclusive findings on diagnostic imaging of breast: Secondary | ICD-10-CM

## 2021-08-10 ENCOUNTER — Other Ambulatory Visit: Payer: Self-pay | Admitting: Internal Medicine

## 2021-08-10 DIAGNOSIS — E669 Obesity, unspecified: Secondary | ICD-10-CM

## 2021-08-15 NOTE — Progress Notes (Deleted)
MEDICARE ANNUAL WELLNESS VISIT AND 3 MONTH FOLLOW UP  Assessment:   Semira was seen today for follow-up and medicare wellness.  Diagnoses and all orders for this visit:  Medicare annual wellness visit, subsequent Yearly Mammogram, scheduled 05/2020  Essential hypertension No medications at this time Monitor blood pressure at home; call if consistently over 130/80 Continue DASH diet.   Reminder to go to the ER if any CP, SOB, nausea, dizziness, severe HA, changes vision/speech, left arm numbness and tingling and jaw pain. -     CBC with Differential/Platelet -     COMPLETE METABOLIC PANEL WITH GFR -     Magnesium  Hyperlipidemia, mixed Continue medications: Crestor '20mg'$  and zetia '10mg'$  Continue low cholesterol diet and exercise.  Check lipid panel.  -     Lipid panel  Vitamin D deficiency Continue supplementation -     VITAMIN D 25 Hydroxy (Vit-D Deficiency, Fractures)  Hypothyroidism, unspecified type Will check labs today, some hyper symptoms -     levothyroxine (SYNTHROID) 100 MCG tablet; Take half tablet (29mg)  Mon & Thurs and whole tablet five days a week. Take on an empty stomach with only water for 30 minutes & no Antacid meds for 4 hours.  Gastroesophageal reflux disease, esophagitis presence not specified Doing well at this time Continue: famotidine '40mg'$  BID Diet discussed Monitor for triggers Avoid food with high acid content Avoid excessive cafeine Increase water intake  Fibromyalgia Syndrome Doing well at this time Management with gabapentin at night few times a week when she has flares   Diverticulosis Doing well IBS-D Doing well Has changed diertary intake Taking hyoscyamine 0.'125mg'$  once a week  Hypokalemia Taking Rx supplementation -CMP  Anxiety Doing well on current regiment: Effexor-XR 37.'5mg'$  daily and Alprazolam 0.'5mg'$  half tablet Discussed taking only PRN and limiting to 5 days a week to avoid addiction  Abnormal glucose Discussed  dietary and exercise modifications -     Hemoglobin A1c  Medication management Continued   BMI 30.0-30.9,adult Discussed dietary and exercise modifications   Over 40 minutes of face to face exam, counseling, chart review and critical decision making was performed.  Future Appointments  Date Time Provider [place] 08/16/2021 10:30 AM MMagda Bernheim NP [facility] None  08/23/2021  2:15 PM YMarybelle Killings MD [facility] None  08/25/2021  3:30 PM [facility] DIAG [place] 2 [facility] CE  08/25/2021  3:40 PM GI[facility] [place]2 [facility] CE  08/31/2021 11:00 AM MMagda Bernheim NP [facility] None  02/09/2022  3:00 PM MUnk Pinto MD [facility] None     Plan:   During the course of the visit the patient was educated and counseled about appropriate screening and preventive services including:   Pneumococcal vaccine  Prevnar 13 Influenza vaccine Td vaccine Screening electrocardiogram Bone densitometry screening Colorectal cancer screening Diabetes screening Glaucoma screening Nutrition counseling  Advanced directives: requested   Subjective:  Desiree POWLESis a 65y.o. female who presents for Medicare Annual Wellness Visit and 3 month follow up on HTN, HLD, hypothyroidism, GERD, weight and vitamin D Defciency. She has history of fibromyalgia and chronic pain from DDD/DJD.  Also has history of breast cancer, right, s/p mastectomy (04/2016) with radiation and chemo.  She is taking Tamoxifen x5 years and follows with Dr MJana Hakim  Four weeks ago, at Dr SJena Gaussoffice, had ingrown toenail.  She completed an antibiotic treatment.  She reports she has been soaking once a day in epsom salts.  She reports it  has improved but still small amount of redness at the base of her left toenail.  Has been taking Zyrtec daily.  She started taking flonase daily,  She reports some pressure behind her head.   HTN predates 2004.  Her blood pressure has been controlled at home, today their BP is    She does workout. She denies chest pain, shortness of breath, dizziness.   She is on cholesterol medication and denies myalgias. Her cholesterol is not at goal. The cholesterol last visit was:   Lab Results  Component Value Date   CHOL 108 02/02/2021   HDL 44 (L) 02/02/2021   LDLCALC 39 02/02/2021   TRIG 177 (H) 02/02/2021   CHOLHDL 2.5 02/02/2021    She has been working on diet and exercise for abnormal glucose/prediabetes, and denies hyperglycemia, hypoglycemia , nausea, polydipsia, polyuria and visual disturbances. Last A1C in the office was:  Lab Results  Component Value Date   HGBA1C 5.9 (H) 01/02/2019   Last GFR:   Lab Results  Component Value Date   GFRNONAA 96 01/02/2019   Lab Results  Component Value Date   GFRAA 111 01/02/2019   Patient is on Vitamin D supplement.  Deficiency noted in 09/2007.  Last check she was at goal. Lab Results  Component Value Date   VD25OH 45 01/02/2019     She has been on thyroid replacement since 2002. Currently taking levothyroxine 38mg daily.  Her medication was changed last visit. Lab Results  Component Value Date   TSH 0.29 (L) 04/15/2019      Medication Review: Current Outpatient Medications on File Prior to Visit  Medication Sig Dispense Refill   Acetaminophen (TYLENOL) 325 MG CAPS Take by mouth.     ALPRAZolam (XANAX) 0.5 MG tablet TAKE 1/2-1 TABLET BY MOUTH 2-3 TIMES DAILY ONLY IF NEEDED FOR ANXIETY ATTACK AND LIMIT TO 5 DAYS A WEEK TO AVOID ADDICTION AND DEMENTIA 90 tablet 0   Ascorbic Acid (VITAMIN C) 1000 MG tablet Take 1,000 mg by mouth daily.     cetirizine (ZYRTEC) 10 MG tablet Take 10 mg by mouth daily.     Cholecalciferol (VITAMIN D-3) 5000 UNITS TABS Take 5,000 Units by mouth 2 (two) times daily.      cholestyramine (QUESTRAN) 4 g packet Take 1 packet (4 g total) by mouth 2 (two) times daily. 60 each 5   co-enzyme Q-10 30 MG capsule Take 30 mg by mouth 3 (three) times daily.     dicyclomine (BENTYL) 10 MG  capsule TAKE 1 CAPSULE BY MOUTH 4 TIMES DAILY BEFORE MEAL(S) AND AT BEDTIME 90 capsule 0   ezetimibe (ZETIA) 10 MG tablet TAKE 1 TABLET BY MOUTH ONCE DAILY FOR CHOLESTEROL 90 tablet 1   famotidine (PEPCID) 40 MG tablet Take 1 tablet (40 mg total) by mouth as needed.     fluconazole (DIFLUCAN) 150 MG tablet Take day 1 and day 4 2 tablet 0   fluticasone (FLONASE) 50 MCG/ACT nasal spray USE TWO SPRAY(S) IN EACH NOSTRIL ONCE DAILY AS NEEDED 48 g 3   gabapentin (NEURONTIN) 600 MG tablet TAKE 1/2 TO 1 (ONE-HALF TO ONE) TABLET BY MOUTH 2 TO 3 TIMES DAILY AS  NEEDED  TO  CONTROL  PAIN 270 tablet 0   hydrochlorothiazide (HYDRODIURIL) 25 MG tablet Take 1 tablet (25 mg total) by mouth daily. 30 tablet 3   hyoscyamine (LEVSIN SL) 0.125 MG SL tablet DISSOLVE 1 TABLET IN MOUTH EVERY 6 HOURS AS NEEDED 60 tablet 0  levothyroxine (SYNTHROID) 100 MCG tablet Take  1 tablet  Daily  on an empty stomach with only water  for 30 minutes & no Antacid meds, Calcium or Magnesium for 4 hours & avoid Biotin 90 tablet 1   losartan (COZAAR) 50 MG tablet Take 1 tablet (50 mg total) by mouth daily. 30 tablet 0   meclizine (ANTIVERT) 25 MG tablet 1/2-1 pill up to 3 times daily for motion sickness/dizziness 30 tablet 0   Multiple Vitamin (MULTIVITAMIN PO) Take 1 tablet by mouth daily. Takes Geritabs     phentermine (ADIPEX-P) 37.5 MG tablet TAKE 1/2 TO 1 (ONE-HALF TO ONE) TABLET BY MOUTH IN THE MORNING FOR  DIETING  AND  WEIGHT  LOSS 30 tablet 0   potassium chloride SA (KLOR-CON) 20 MEQ tablet TAKE 1 TABLET BY MOUTH TWICE DAILY FOR  POTASSIUM 180 tablet 2   pseudoephedrine (SUDAFED) 30 MG tablet Take 30 mg by mouth every 4 (four) hours as needed for congestion.     rizatriptan (MAXALT) 10 MG tablet Take 1 tablet (10 mg total) by mouth once as needed for migraine. May repeat in 2 hours if needed 10 tablet 0   rosuvastatin (CRESTOR) 10 MG tablet Take  1 tablet  Daily for Cholesterol   /TAKE 1 TABLET BY MOUTH ONCE DAILY FOR CHOLESTEROL  90 tablet 3   topiramate (TOPAMAX) 50 MG tablet Take  1/2 to 1 tablet  2 x /day  at Suppertime & Bedtime for Dieting & Weight Loss 180 tablet 1   triamcinolone cream (KENALOG) 0.1 % Apply 1 application topically 2 (two) times daily. 30 g 0   venlafaxine XR (EFFEXOR-XR) 37.5 MG 24 hr capsule Take 1 capsule (37.5 mg total) by mouth daily with breakfast. 90 capsule 4   zinc gluconate 50 MG tablet Take 50 mg by mouth daily.     No current facility-administered medications on file prior to visit.    Allergies  Allergen Reactions   Iodine    Lipitor [Atorvastatin] Other (See Comments)    Muscle and legs aches   Shellfish Allergy Diarrhea and Nausea And Vomiting   Betadine [Povidone Iodine] Rash    Current Problems (verified) Patient Active Problem List   Diagnosis Date Noted   Spinal stenosis of cervical region 07/04/2021   Other spondylosis with radiculopathy, cervical region 03/29/2021   Insomnia 03/25/2018   Situational anxiety 03/25/2018   Encounter for Medicare annual wellness exam 02/19/2017   Tobacco chew use 05/15/2016   Malignant neoplasm of upper-outer quadrant of right breast in female, estrogen receptor positive ([place]) 04/21/2016   Hypothyroidism 04/18/2016   Obesity (BMI 30.0-34.9) 10/11/2015   Medication management 09/24/2014   Cystocele, midline 03/23/2014   Essential hypertension 12/10/2013   Mixed hyperlipidemia 12/10/2013   Prediabetes 12/10/2013   Vitamin D deficiency 12/10/2013   DJD  12/10/2013   DDD, lumbar 12/10/2013   Fibromyalgia Syndrome 12/10/2013    Screening Tests Immunization History  Administered Date(s) Administered   Influenza Inj Mdck Quad With Preservative 09/04/2017, 09/30/2018, 09/15/2020   Influenza Split 09/18/2013, 09/25/2014, 10/21/2019   Influenza, Seasonal, Injecte, Preservative Fre 10/11/2015   Influenza,inj,quad, With Preservative 08/28/2016, 08/27/2017   Influenza-Unspecified 08/27/2016   PFIZER(Purple Top)SARS-COV-2 Vaccination  02/20/2020, 03/12/2020   Pneumococcal-Unspecified 11/27/2000   Tdap 03/16/2011    Preventative care: Last colonoscopy:over 13 years ago,  02/2017 Cologuard, 2/21 next 10years Last mammogram: 05/2019, schedule for 2021 Last pap smear/pelvic exam: 03/2016  - Dr Harrington Challenger DEXA: At age age 16years  Prior vaccinations: TD  or Tdap: 2012  Influenza: 2019 Pneumococcal: 2002 Prevnar13: Due at age 78 Shingles/Zostavax: DUE, discussed Shingrix SARS-COV2-Pfizer complete 02/2020  Names of Other Physician/Practitioners you currently use: 1. [facility] here for primary care 2. Eye Exam Dr Manuella Ghazi 02/2018 3. Dental Exam Dr Minerva Fester, 5 years ago, DUE  Patient Care Team: Unk Pinto, MD as PCP - General (Internal Medicine) Fay Records, MD as Consulting Physician (Cardiology) [name], MD as Consulting Physician (Orthopedic Surgery) Carolan Clines, MD (Inactive) as Consulting Physician (Urology) Vanessa Kick, MD as Consulting Physician (Obstetrics and Gynecology) Magrinat, Virgie Dad, MD as Consulting Physician (Oncology) Rolm Bookbinder, MD as Consulting Physician (General Surgery) Thea Silversmith, MD as Consulting Physician (Radiation Oncology)  SURGICAL HISTORY She  has a past surgical history that includes Incontinence surgery (2009  &  2005); BLADDER TACK (2003); Vein bypass surgery (AGE 16); transthoracic echocardiogram (06-16-2013); Cardiovascular stress test (07-02-2013  [facility]); CYSTO/  HYDRODISTENTION/  BLADDER BX/   INSTILLATION THERAPY (08-29-2010); Vaginal hysterectomy (2000); Cataract extraction w/ intraocular lens implant (Left, 01/2014); Excision of mesh (N/A, 03/23/2014); Excision vaginal cyst (N/A, 04/06/2014); Radioactive seed guided mastectomy with axillary sentinel lymph node biopsy (Right, 05/18/2016); Breast lumpectomy (Right, 05/18/2016); Breast biopsy (Right, 04/18/2016); Cholecystectomy (N/A, 12/10/2017); Cataract extraction w/  intraocular lens implant (Right, 12/2017); and Breast mass removal (Left, 1985). FAMILY HISTORY Her family history includes Diabetes in her mother; Diverticulitis in her mother and sister; Intestinal polyp in her sister; Lung cancer in her brother. SOCIAL HISTORY She  reports that she quit smoking about 25 years ago. Her smoking use included cigarettes. Her smokeless tobacco use includes snuff. She reports that she does not drink alcohol and does not use drugs.   MEDICARE WELLNESS OBJECTIVES: Physical activity:   Cardiac risk factors:   Depression/mood screen:   Depression screen [facility] 2/9 02/01/2021  Decreased Interest 0  Down, Depressed, Hopeless 0  PHQ - 2 Score 0  Some recent data might be hidden    ADLs:  In your present state of health, do you have any difficulty performing the following activities: 02/01/2021  Hearing? N  Vision? N  Difficulty concentrating or making decisions? N  Walking or climbing stairs? N  Dressing or bathing? N  Doing errands, shopping? N  Some recent data might be hidden     Cognitive Testing  Alert? Yes  Normal Appearance?Yes  Oriented to person? Yes  Place? Yes   Time? Yes  Recall of three objects?  Yes  Can perform simple calculations? Yes  Displays appropriate judgment?Yes  Can read the correct time from a watch face?Yes  EOL planning:     Review of Systems  Constitutional:  Negative for chills, fever and weight loss.  HENT:  Negative for congestion and hearing loss.   Eyes:  Negative for blurred vision and double vision.  Respiratory:  Negative for cough and shortness of breath.   Cardiovascular:  Negative for chest pain, palpitations, orthopnea and leg swelling.  Gastrointestinal:  Negative for abdominal pain, constipation, diarrhea, heartburn, nausea and vomiting.  Musculoskeletal:  Negative for falls, joint pain and myalgias.  Skin:  Negative for rash.  Neurological:  Negative for dizziness, tingling, tremors, loss of consciousness and  headaches.  Psychiatric/Behavioral:  Negative for depression, memory loss and suicidal ideas.     Objective:     There were no vitals filed for this visit.  There is no height or weight on file to calculate BMI.  General appearance: alert, no  distress, WD/WN, female HEENT: normocephalic, sclerae anicteric, TMs pearly, nares patent, no discharge or erythema, pharynx normal Oral cavity: MMM, no lesions Neck: supple, no lymphadenopathy, no thyromegaly, no masses Heart: RRR, normal S1, S2, no murmurs Lungs: CTA bilaterally, no wheezes, rhonchi, or rales Abdomen: +bs, soft, non tender, non distended, no masses, no hepatomegaly, no splenomegaly Musculoskeletal: nontender, no swelling, no obvious deformity Extremities: no edema, no cyanosis, no clubbing Pulses: 2+ symmetric, upper and lower extremities, normal cap refill Neurological: alert, oriented x 3, CN2-12 intact, strength normal upper extremities and lower extremities, sensation normal throughout, DTRs 2+ throughout, no cerebellar signs, gait normal Psychiatric: normal affect, behavior normal, pleasant   Medicare Attestation I have personally reviewed: [name] medical and social history Their use of alcohol, tobacco or illicit drugs Their current medications and supplements [name] functional ability including ADLs,fall risks, home safety risks, cognitive, and hearing and visual impairment Diet and physical activities Evidence for depression or mood disorders  [name] weight, height, BMI, and visual acuity have been recorded in the chart.  I have made referrals, counseling, and provided education to the patient based on review of the above and I have provided the patient with a written personalized care plan for preventive services.    Magda Bernheim ANP-C  [facility]  08/15/2021

## 2021-08-16 ENCOUNTER — Other Ambulatory Visit: Payer: Self-pay | Admitting: Adult Health

## 2021-08-16 ENCOUNTER — Ambulatory Visit: Payer: Medicare Other | Admitting: Nurse Practitioner

## 2021-08-16 DIAGNOSIS — K580 Irritable bowel syndrome with diarrhea: Secondary | ICD-10-CM

## 2021-08-16 DIAGNOSIS — K219 Gastro-esophageal reflux disease without esophagitis: Secondary | ICD-10-CM

## 2021-08-16 DIAGNOSIS — Z79899 Other long term (current) drug therapy: Secondary | ICD-10-CM

## 2021-08-16 DIAGNOSIS — E782 Mixed hyperlipidemia: Secondary | ICD-10-CM

## 2021-08-16 DIAGNOSIS — K5790 Diverticulosis of intestine, part unspecified, without perforation or abscess without bleeding: Secondary | ICD-10-CM

## 2021-08-16 DIAGNOSIS — F419 Anxiety disorder, unspecified: Secondary | ICD-10-CM

## 2021-08-16 DIAGNOSIS — Z6830 Body mass index (BMI) 30.0-30.9, adult: Secondary | ICD-10-CM

## 2021-08-16 DIAGNOSIS — E559 Vitamin D deficiency, unspecified: Secondary | ICD-10-CM

## 2021-08-16 DIAGNOSIS — C50411 Malignant neoplasm of upper-outer quadrant of right female breast: Secondary | ICD-10-CM

## 2021-08-16 DIAGNOSIS — Z0000 Encounter for general adult medical examination without abnormal findings: Secondary | ICD-10-CM

## 2021-08-16 DIAGNOSIS — E876 Hypokalemia: Secondary | ICD-10-CM

## 2021-08-16 DIAGNOSIS — I10 Essential (primary) hypertension: Secondary | ICD-10-CM

## 2021-08-16 DIAGNOSIS — E039 Hypothyroidism, unspecified: Secondary | ICD-10-CM

## 2021-08-16 DIAGNOSIS — M797 Fibromyalgia: Secondary | ICD-10-CM

## 2021-08-16 DIAGNOSIS — R7309 Other abnormal glucose: Secondary | ICD-10-CM

## 2021-08-17 ENCOUNTER — Ambulatory Visit: Payer: Medicare Other | Admitting: Orthopaedic Surgery

## 2021-08-17 DIAGNOSIS — K1321 Leukoplakia of oral mucosa, including tongue: Secondary | ICD-10-CM | POA: Diagnosis not present

## 2021-08-17 DIAGNOSIS — K134 Granuloma and granuloma-like lesions of oral mucosa: Secondary | ICD-10-CM | POA: Diagnosis not present

## 2021-08-22 ENCOUNTER — Other Ambulatory Visit: Payer: Self-pay | Admitting: Nurse Practitioner

## 2021-08-22 DIAGNOSIS — I10 Essential (primary) hypertension: Secondary | ICD-10-CM

## 2021-08-23 ENCOUNTER — Encounter: Payer: Self-pay | Admitting: Orthopaedic Surgery

## 2021-08-23 ENCOUNTER — Ambulatory Visit: Payer: Medicare Other | Admitting: Orthopaedic Surgery

## 2021-08-23 ENCOUNTER — Other Ambulatory Visit: Payer: Self-pay

## 2021-08-23 VITALS — BP 104/67 | HR 88 | Ht 62.75 in | Wt 174.0 lb

## 2021-08-23 DIAGNOSIS — M4802 Spinal stenosis, cervical region: Secondary | ICD-10-CM | POA: Diagnosis not present

## 2021-08-23 DIAGNOSIS — M4722 Other spondylosis with radiculopathy, cervical region: Secondary | ICD-10-CM

## 2021-08-23 NOTE — Progress Notes (Signed)
Office Visit Note   Patient: Desiree Anderson           Date of Birth: 14-Jul-1956           MRN: [id number] Visit Date: 08/23/2021              Requested by: Unk Pinto, [address] [place],  [place] [address] PCP: Unk Pinto, MD   Assessment & Plan: Visit Diagnoses:  1. Other spondylosis with radiculopathy, cervical region   2. Spinal stenosis of cervical region     Plan: Cervical spondylosis with mild central stenosis, moderate foraminal stenosis at C5-6 level.  She like to proceed with single level cervical fusion with allograft, plate, overnight stay in the hospital.  We discussed low risk of pseudoarthrosis with single level fusion.  Some problems with dysphagia, dysphonia potentially post op.  Questions were elicited and answered she understands and requests we proceed.   Patient's been through therapy, traction, multiple medications including prednisone Dosepak, topical creams, muscle relaxants, pain medication without relief.  Follow-Up Instructions: No follow-ups on file.   Orders:  No orders of the defined types were placed in this encounter.  No orders of the defined types were placed in this encounter.     Procedures: No procedures performed   Clinical Data: No additional findings.   Subjective: No chief complaint on file.   HPI 65 year old female seen with ongoing problems with back pain that radiates into her shoulders greater than 7 months.  She has more pain on the left than right side.  She has been treated with prednisone Dosepak, physical therapy, home cervical traction, dry needling, Tylenol, gabapentin, Biofreeze.  MRI scan 05/03/2021 showed cervical spondylosis with stenosis at C5-6 with disc bulge bilateral mild flattening of the ventral spinal cord and bilateral neuroforaminal narrowing moderate to severe on the left consistent with her symptoms.  She had mild changes at C4-5 and C6-7 without compression.  She has had  problems sleeping bothers her on a daily basis.  She has been using diclofenac and Tylenol currently.  She does not want to take any narcotic medication.  Pain wakes her up at night.  She has pain that radiates into her arms worse on left than right down to her hand at times.  Patient states this is progressed to the point where she wants to proceed with surgery  Review of Systems positive for prediabetes hyperlipidemia, right breast cancer 2017.  She is on his medication for hypertension which is controlled.  She takes 2 blood pressure medicines which is helped with her headaches.   Objective: Vital Signs: BP 104/67   Pulse 88   Ht 5' 2.75" (1.594 m)   Wt 174 lb (78.9 kg)   BMI 31.07 kg/m   Physical Exam Constitutional:      Appearance: She is well-developed.  HENT:     Head: Normocephalic.     Right Ear: External ear normal.     Left Ear: External ear normal. There is no impacted cerumen.  Eyes:     Pupils: Pupils are equal, round, and reactive to light.  Neck:     Thyroid: No thyromegaly.     Trachea: No tracheal deviation.  Cardiovascular:     Rate and Rhythm: Normal rate.  Pulmonary:     Effort: Pulmonary effort is normal.  Abdominal:     Palpations: Abdomen is soft.  Musculoskeletal:     Cervical back: No rigidity.  Skin:    General:  Skin is warm and dry.  Neurological:     Mental Status: She is alert and oriented to person, place, and time.  Psychiatric:        Behavior: Behavior normal.    Ortho Exam bilateral brachial plexus tenderness much more painful on the left than right with compression.  Positive Spurling on the left negative on the right.  She only has 25% normal rotation to the left with pain increased pain with cervical compression and slight improvement with distraction.  Biceps triceps brachial radialis reflex are 2+ and symmetrical.  No lower extremity clonus normal heel toe gait.  Good balance in standing position.  Specialty Comments:  No specialty  comments available.  Imaging: No results found.   PMFS History: Patient Active Problem List   Diagnosis Date Noted   Spinal stenosis of cervical region 07/04/2021   Other spondylosis with radiculopathy, cervical region 03/29/2021   Insomnia 03/25/2018   Situational anxiety 03/25/2018   Encounter for Medicare annual wellness exam 02/19/2017   Tobacco chew use 05/15/2016   Malignant neoplasm of upper-outer quadrant of right breast in female, estrogen receptor positive ([place]) 04/21/2016   Hypothyroidism 04/18/2016   Obesity (BMI 30.0-34.9) 10/11/2015   Medication management 09/24/2014   Cystocele, midline 03/23/2014   Essential hypertension 12/10/2013   Mixed hyperlipidemia 12/10/2013   Prediabetes 12/10/2013   Vitamin D deficiency 12/10/2013   DJD  12/10/2013   DDD, lumbar 12/10/2013   Fibromyalgia Syndrome 12/10/2013   Past Medical History:  Diagnosis Date   Anxiety    Breast cancer ([place])    Complication of anesthesia    hypotension   DDD (degenerative disc disease), lumbar    Erosion of vaginal mesh ([facility])    Fibromyalgia    GERD (gastroesophageal reflux disease)    History of gastric ulcer    History of radiation therapy 07/06/16- 08/22/16   Right Breast   Hypertension    Hypothyroidism    Personal history of radiation therapy    Rectocele     Family History  Problem Relation Age of Onset   Diabetes Mother    Diverticulitis Mother    Lung cancer Brother    Intestinal polyp Sister    Diverticulitis Sister     Past Surgical History:  Procedure Laterality Date   BLADDER TACK  2003   BREAST BIOPSY Right 04/18/2016   BREAST LUMPECTOMY Right 05/18/2016   Breast mass removal Left 1985   Benign   CARDIOVASCULAR STRESS TEST  07-02-2013  DR DALTON MCLEAN   LOW RISK NUCLEAR STUDY WITH A SMALL, MILD APICAL SEPTAL FIXED PERFUSION DEFECT .  GIVEN NORMAL WALL FUNCTION , SUSPECT REPRESENTS ATTENUATION/  EF 74%/  NO ISCHEMIA   CATARACT EXTRACTION W/ INTRAOCULAR LENS IMPLANT  Left 01/2014   CATARACT EXTRACTION W/ INTRAOCULAR LENS IMPLANT Right 12/2017   Dr. Manuella Ghazi   CHOLECYSTECTOMY N/A 12/10/2017   Procedure: LAPAROSCOPIC CHOLECYSTECTOMY;  Surgeon: Rolm Bookbinder, MD;  Location: [facility];  Service: General;  Laterality: N/A;   CYSTO/  HYDRODISTENTION/  BLADDER BX/   INSTILLATION THERAPY  08-29-2010   EXCISION OF MESH N/A 03/23/2014   Procedure: EXCISION OF VAGINAL AND PERI-URETHRAL MESH, EXTRACTION FROM URETHRA TO APEX, insertion of XENFORM in the vaginal vault;  Surgeon: Ailene Rud, MD;  Location: [facility];  Service: Urology;  Laterality: N/A;   EXCISION VAGINAL CYST N/A 04/06/2014   Procedure: VAGINAL SPECULUM EXAMINATION/POSSIBLE DRAINAGE OF HEMATOMA;  Surgeon: Ailene Rud, MD;  Location: [facility];  Service: Urology;  Laterality: N/A;   INCONTINENCE SURGERY  2009  &  2005   RADIOACTIVE SEED GUIDED PARTIAL MASTECTOMY WITH AXILLARY SENTINEL LYMPH NODE BIOPSY Right 05/18/2016   Procedure: RADIOACTIVE SEED GUIDED RIGHT BREAST LUMPECTOMY WITH AXILLARY SENTINEL LYMPH NODE BIOPSY;  Surgeon: Rolm Bookbinder, MD;  Location: [facility];  Service: General;  Laterality: Right;  RADIOACTIVE SEED GUIDED RIGHT BREAST LUMPECTOMY WITH AXILLARY SENTINEL LYMPH NODE BIOPSY   TRANSTHORACIC ECHOCARDIOGRAM  06-16-2013   GRADE I DIASTOLIC DYSFUNCTION/  EF 55-60%/  MILD AR/  TRIVIAL MR  &  TR   VAGINAL HYSTERECTOMY  2000   w/  unilateral salpingoophorectomy   VEIN BYPASS SURGERY  AGE 87   RIGHT LEG   Social History   Occupational History   Not on file  Tobacco Use   Smoking status: Former    Years: 20.00    Types: Cigarettes    Quit date: 11/28/1995    Years since quitting: 25.7   Smokeless tobacco: Current    Types: Snuff   Tobacco comments:    dips daily  Vaping Use   Vaping Use: Never used  Substance and Sexual Activity   Alcohol use: No   Drug use: No   Sexual activity: Not on file

## 2021-08-24 ENCOUNTER — Other Ambulatory Visit: Payer: Self-pay

## 2021-08-25 ENCOUNTER — Other Ambulatory Visit: Payer: Self-pay

## 2021-08-25 ENCOUNTER — Ambulatory Visit
Admission: RE | Admit: 2021-08-25 | Discharge: 2021-08-25 | Disposition: A | Discharge status: Home / Self Care | Payer: Medicare Other | Source: Ambulatory Visit | Attending: Oncology | Admitting: Oncology

## 2021-08-25 ENCOUNTER — Other Ambulatory Visit: Payer: Self-pay | Admitting: Oncology

## 2021-08-25 DIAGNOSIS — R922 Inconclusive mammogram: Secondary | ICD-10-CM | POA: Diagnosis not present

## 2021-08-25 DIAGNOSIS — R928 Other abnormal and inconclusive findings on diagnostic imaging of breast: Secondary | ICD-10-CM

## 2021-08-25 DIAGNOSIS — Z853 Personal history of malignant neoplasm of breast: Secondary | ICD-10-CM | POA: Diagnosis not present

## 2021-08-26 ENCOUNTER — Ambulatory Visit
Admission: RE | Admit: 2021-08-26 | Discharge: 2021-08-26 | Disposition: A | Discharge status: Home / Self Care | Payer: Medicare Other | Source: Ambulatory Visit | Attending: Oncology | Admitting: Oncology

## 2021-08-26 ENCOUNTER — Other Ambulatory Visit: Payer: Self-pay | Admitting: Oncology

## 2021-08-26 DIAGNOSIS — N6311 Unspecified lump in the right breast, upper outer quadrant: Secondary | ICD-10-CM | POA: Diagnosis not present

## 2021-08-26 DIAGNOSIS — R928 Other abnormal and inconclusive findings on diagnostic imaging of breast: Secondary | ICD-10-CM

## 2021-08-26 DIAGNOSIS — N641 Fat necrosis of breast: Secondary | ICD-10-CM | POA: Diagnosis not present

## 2021-08-26 DIAGNOSIS — N6031 Fibrosclerosis of right breast: Secondary | ICD-10-CM | POA: Diagnosis not present

## 2021-08-30 NOTE — Progress Notes (Signed)
3 MONTH FOLLOW UP  Assessment / Plan:   Desiree Anderson was seen today for 3 month follow-up   Diagnoses and all orders for this visit:  Essential hypertension Currently taking only Olmesartan 40 mg 1/2 tab daily and BP log showed control- continue current dosage Monitor blood pressure at home; call if consistently over 130/80 Continue DASH diet.   Reminder to go to the ER if any CP, SOB, nausea, dizziness, severe HA, changes vision/speech, left arm numbness and tingling and jaw pain. -     CBC with Differential/Platelet -     COMPLETE METABOLIC PANEL WITH GFR -     Magnesium  Hyperlipidemia, mixed Continue medications: Crestor 20mg  and zetia 10mg  Continue low cholesterol diet and exercise.  Check lipid panel.  -     Lipid panel  Fibromyalgia Pt is complaining of worsening symptoms, states muscle relaxants have helped in past Will try Baclofen 10 mg tid as needed. Advised to stop at least 7 days prior to upcoming cervical fusion  Vitamin D deficiency Continue supplementation -     VITAMIN D 25 Hydroxy (Vit-D Deficiency, Fractures)  Hypothyroidism, unspecified type Will check labs today, some hyper symptoms -     levothyroxine (SYNTHROID) 100 MCG tablet; Take half tablet (5mcg)  daily on an empty stomach with only water for 30 minutes & no Antacid meds for 4 hours.  Gastroesophageal reflux disease, esophagitis presence not specified Doing well at this time Continue PPI/H2 blocker, diet discussed  Hypokalemia Taking Rx supplementation Will check labs  Anxiety Doing well on current regiment Taking Alprazolam 0.5mg  half tablet Discussed taking only PRN and limiting to 5 days a week to avoid addiction  Abnormal glucose Discussed dietary and exercise modifications -     Hemoglobin A1c   Medication management -     CBC with Differential/Platelet -     COMPLETE METABOLIC PANEL WITH GFR -     Lipid panel -     TSH -     Hemoglobin A1c  URI  Continue Mucinex DM Zpak as  directed Promethazine DM as needed    History of breast cancer/ Malignant neoplasm of upper-outer quadrant of right breast in female, estrogen receptor positive ([place]) Regular mammograms, follows with oncology Dr Billy Fischer Has completed Tamoxifen  Insomnia, unspecified type Doing well at this time Discussed good sleep hygiene, decrease stimulation prior to sleep Increase day time activity Avoid caffeine in evenings Try OTC Melatonin 10mg  or benedryl  Need for influenza vaccination -     Flu vaccine Quad 6+ months PF IM Received today     Over 30 minutes of interview exam, counseling, chart review and critical decision making was performed  Future Appointments  Date Time Provider [place]  09/08/2021 10:30 AM Lanae Crumbly, PA-C [facility] None  09/23/2021  1:15 PM [name], MD [facility] None  02/09/2022  3:00 PM Unk Pinto, MD [facility] None  08/31/2022 11:00 AM Magda Bernheim, NP [facility] None      Subjective:  Desiree Anderson is a 65 y.o. female who presents for 3 month follow up on HTN, HLD, hypothyroidism, GERD, weight and vitamin D Deficency.   She has history of fibromyalgia and chronic pain from DDD/DJD.  Pt has cervical spinal fusion on 09/16/21 with Dr. Lorin Mercy, pain is more severe and having tingling down arms bilaterally.  Also has history of breast cancer, right, s/p mastectomy (04/2016) with radiation and chemo.  She has finished her Tamoxifen and follows with Dr Jana Hakim.  Last appointment was 07/09/19.  She had an ultrasound of right breast reagrding palpable changes.  Oil cysts noted as well as fat changes. July 2021 next scheduled mammogram.  Getting up twice during the night to use the bathroom.  Denies any dysuria, fevers or hematuria.  Pt started 10 days ago having nasal congestion, sinus pressure, has yellow nasal discharge, has had productive cough of yellow mucus.  Has had some chills, lots of body aches. Denies nausea, vomiting , diarrhea and  constipation. Covid test 7 days ago was negative.  BMI is Body mass index is 33.6 kg/m., she has not been working on diet and exercise. 9/27 weight was at a different office Wt Readings from Last 3 Encounters:  08/31/21 188 lb 3.2 oz (85.4 kg)  08/23/21 174 lb (78.9 kg)  07/26/21 183 lb (83 kg)    HTN predates 2004.  Her blood pressure has been controlled at home, today their BP is BP: 128/80 She does workout. She denies chest pain, shortness of breath, dizziness.   She is on cholesterol medication and denies myalgias. Her cholesterol is not at goal. The cholesterol last visit was:   Lab Results  Component Value Date   CHOL 157 07/17/2019   HDL 51 07/17/2019   LDLCALC 84 07/17/2019   TRIG 123 07/17/2019   CHOLHDL 3.1 07/17/2019    She has been working on diet and exercise for abnormal glucose/prediabetes, and denies hyperglycemia, hypoglycemia , nausea, polydipsia, polyuria and visual disturbances. Last A1C in the office was:  Lab Results  Component Value Date   HGBA1C 5.9 (H) 01/02/2019   Last GFR:   Lab Results  Component Value Date   GFRNONAA 96 01/02/2019   Lab Results  Component Value Date   GFRAA 111 01/02/2019   Patient is on Vitamin D supplement.  Deficiency noted in 09/2007.  Last check she was at goal. Lab Results  Component Value Date   VD25OH 51 01/02/2019     She has been on thyroid replacement since 2002. Currently taking levothyroxine 29mcg daily.  Her medication was changed last visit. Lab Results  Component Value Date   TSH 0.29 (L) 04/15/2019      Medication Review: Current Outpatient Medications on File Prior to Visit  Medication Sig Dispense Refill   Acetaminophen (TYLENOL) 325 MG CAPS Take by mouth.     ALPRAZolam (XANAX) 0.5 MG tablet TAKE 1/2-1 TABLET BY MOUTH 2-3 TIMES DAILY ONLY IF NEEDED FOR ANXIETY ATTACK AND LIMIT TO 5 DAYS A WEEK TO AVOID ADDICTION AND DEMENTIA 90 tablet 0   Ascorbic Acid (VITAMIN C) 1000 MG tablet Take 1,000 mg by  mouth daily.     cetirizine (ZYRTEC) 10 MG tablet Take 10 mg by mouth daily.     Cholecalciferol (VITAMIN D-3) 5000 UNITS TABS Take 5,000 Units by mouth 2 (two) times daily.      cholestyramine (QUESTRAN) 4 g packet Take 1 packet (4 g total) by mouth 2 (two) times daily. 60 each 5   dicyclomine (BENTYL) 10 MG capsule TAKE 1 CAPSULE BY MOUTH 4 TIMES DAILY BEFORE MEAL(S) AND AT BEDTIME 90 capsule 0   ezetimibe (ZETIA) 10 MG tablet TAKE 1 TABLET BY MOUTH ONCE DAILY FOR CHOLESTEROL 90 tablet 1   fluticasone (FLONASE) 50 MCG/ACT nasal spray USE TWO SPRAY(S) IN EACH NOSTRIL ONCE DAILY AS NEEDED 48 g 3   gabapentin (NEURONTIN) 600 MG tablet TAKE 1/2 TO 1 (ONE-HALF TO ONE) TABLET BY MOUTH 2 TO 3 TIMES DAILY AS  NEEDED  TO  CONTROL  PAIN 270 tablet 0   hyoscyamine (LEVSIN SL) 0.125 MG SL tablet DISSOLVE 1 TABLET IN MOUTH EVERY 6 HOURS AS NEEDED 60 tablet 0   levothyroxine (SYNTHROID) 100 MCG tablet Take  1 tablet  Daily  on an empty stomach with only water  for 30 minutes & no Antacid meds, Calcium or Magnesium for 4 hours & avoid Biotin 90 tablet 1   meclizine (ANTIVERT) 25 MG tablet 1/2-1 pill up to 3 times daily for motion sickness/dizziness 30 tablet 0   Multiple Vitamin (MULTIVITAMIN PO) Take 1 tablet by mouth daily. Takes Geritabs     olmesartan (BENICAR) 40 MG tablet Take  1 /2 to 1 tablet  Daily  for BP 90 tablet 3   potassium chloride SA (KLOR-CON) 20 MEQ tablet TAKE 1 TABLET BY MOUTH TWICE DAILY FOR  POTASSIUM 180 tablet 2   pseudoephedrine (SUDAFED) 30 MG tablet Take 30 mg by mouth every 4 (four) hours as needed for congestion.     rosuvastatin (CRESTOR) 10 MG tablet Take  1 tablet  Daily for Cholesterol   /TAKE 1 TABLET BY MOUTH ONCE DAILY FOR CHOLESTEROL 90 tablet 3   topiramate (TOPAMAX) 50 MG tablet Take  1/2 to 1 tablet  2 x /day  at Suppertime & Bedtime for Dieting & Weight Loss 180 tablet 1   zinc gluconate 50 MG tablet Take 50 mg by mouth daily.     co-enzyme Q-10 30 MG capsule Take 30 mg  by mouth 3 (three) times daily. (Patient not taking: Reported on 08/31/2021)     famotidine (PEPCID) 40 MG tablet Take 1 tablet (40 mg total) by mouth as needed.     fluconazole (DIFLUCAN) 150 MG tablet Take day 1 and day 4 (Patient not taking: Reported on 08/31/2021) 2 tablet 0   hydrochlorothiazide (HYDRODIURIL) 25 MG tablet Take 1 tablet (25 mg total) by mouth daily. (Patient not taking: Reported on 08/31/2021) 30 tablet 3   phentermine (ADIPEX-P) 37.5 MG tablet TAKE 1/2 TO 1 (ONE-HALF TO ONE) TABLET BY MOUTH IN THE MORNING FOR  DIETING  AND  WEIGHT  LOSS (Patient not taking: Reported on 08/31/2021) 30 tablet 0   rizatriptan (MAXALT) 10 MG tablet Take 1 tablet (10 mg total) by mouth once as needed for migraine. May repeat in 2 hours if needed 10 tablet 0   triamcinolone cream (KENALOG) 0.1 % Apply 1 application topically 2 (two) times daily. (Patient not taking: Reported on 08/31/2021) 30 g 0   venlafaxine XR (EFFEXOR-XR) 37.5 MG 24 hr capsule Take 1 capsule (37.5 mg total) by mouth daily with breakfast. 90 capsule 4   No current facility-administered medications on file prior to visit.    Allergies  Allergen Reactions   Iodine    Lipitor [Atorvastatin] Other (See Comments)    Muscle and legs aches   Shellfish Allergy Diarrhea and Nausea And Vomiting   Betadine [Povidone Iodine] Rash    Current Problems (verified) Patient Active Problem List   Diagnosis Date Noted   Spinal stenosis of cervical region 07/04/2021   Other spondylosis with radiculopathy, cervical region 03/29/2021   Insomnia 03/25/2018   Situational anxiety 03/25/2018   Encounter for Medicare annual wellness exam 02/19/2017   Tobacco chew use 05/15/2016   Malignant neoplasm of upper-outer quadrant of right breast in female, estrogen receptor positive ([place]) 04/21/2016   Hypothyroidism 04/18/2016   Obesity (BMI 30.0-34.9) 10/11/2015   Medication management 09/24/2014   Cystocele, midline 03/23/2014  Essential hypertension  12/10/2013   Mixed hyperlipidemia 12/10/2013   Prediabetes 12/10/2013   Vitamin D deficiency 12/10/2013   DJD  12/10/2013   DDD, lumbar 12/10/2013   Fibromyalgia Syndrome 12/10/2013    Screening Tests Immunization History  Administered Date(s) Administered   Influenza Inj Mdck Quad With Preservative 09/04/2017, 09/30/2018, 09/15/2020   Influenza Split 09/18/2013, 09/25/2014, 10/21/2019   Influenza, Seasonal, Injecte, Preservative Fre 10/11/2015   Influenza,inj,quad, With Preservative 08/28/2016, 08/27/2017   Influenza-Unspecified 08/27/2016   PFIZER(Purple Top)SARS-COV-2 Vaccination 02/20/2020, 03/12/2020   Pneumococcal-Unspecified 11/27/2000   Tdap 03/16/2011    Preventative care: Last colonoscopy:2021 repeat due 2031 Last mammogram: 07/2021 mammogram and U/S with biopsy 08/24/21 revealed scar tissue Last pap smear/pelvic exam: 2017   DEXA: At age age 63years  Prior vaccinations: TD or Tdap: 2012  Influenza: 09/2019, received today Pneumococcal: 2002 Prevnar13: Due at age 22 Shingles/Zostavax: DUE, discussed Shingrix   Names of Other Physician/Practitioners you currently use: 1. [facility] here for primary care 2. Eye Exam Dr Manuella Ghazi 2021 3. Dental Exam Dr Buelah Manis 2022  Patient Care Team: Unk Pinto, MD as PCP - General (Internal Medicine) Fay Records, MD as Consulting Physician (Cardiology) [name], MD as Consulting Physician (Orthopedic Surgery) Carolan Clines, MD (Inactive) as Consulting Physician (Urology) Vanessa Kick, MD as Consulting Physician (Obstetrics and Gynecology) Magrinat, Virgie Dad, MD as Consulting Physician (Oncology) Rolm Bookbinder, MD as Consulting Physician (General Surgery) Thea Silversmith, MD as Consulting Physician (Radiation Oncology)  SURGICAL HISTORY She  has a past surgical history that includes Incontinence surgery (2009  &  2005); BLADDER TACK (2003); Vein bypass surgery (AGE  41); transthoracic echocardiogram (06-16-2013); Cardiovascular stress test (07-02-2013  [facility]); CYSTO/  HYDRODISTENTION/  BLADDER BX/   INSTILLATION THERAPY (08-29-2010); Vaginal hysterectomy (2000); Cataract extraction w/ intraocular lens implant (Left, 01/2014); Excision of mesh (N/A, 03/23/2014); Excision vaginal cyst (N/A, 04/06/2014); Radioactive seed guided mastectomy with axillary sentinel lymph node biopsy (Right, 05/18/2016); Breast lumpectomy (Right, 05/18/2016); Breast biopsy (Right, 04/18/2016); Cholecystectomy (N/A, 12/10/2017); Cataract extraction w/ intraocular lens implant (Right, 12/2017); and Breast mass removal (Left, 1985). FAMILY HISTORY Her family history includes Diabetes in her mother; Diverticulitis in her mother and sister; Intestinal polyp in her sister; Lung cancer in her brother. SOCIAL HISTORY She  reports that she quit smoking about 25 years ago. Her smoking use included cigarettes. Her smokeless tobacco use includes snuff. She reports that she does not drink alcohol and does not use drugs.   Review of Systems  Constitutional:  Positive for chills. Negative for fever.  HENT:  Positive for congestion and sinus pain. Negative for hearing loss, sore throat and tinnitus.   Eyes:  Negative for blurred vision and double vision.  Respiratory:  Positive for cough (Productive of yellow mucus). Negative for hemoptysis, sputum production, shortness of breath and wheezing.   Cardiovascular:  Negative for chest pain, palpitations and leg swelling.  Gastrointestinal:  Positive for diarrhea (rare). Negative for abdominal pain, constipation, heartburn, nausea and vomiting.  Genitourinary:  Negative for dysuria and urgency.  Musculoskeletal:  Positive for back pain, myalgias and neck pain. Negative for falls and joint pain.  Skin:  Negative for rash.  Neurological:  Positive for tingling (down arms bilaterally). Negative for dizziness, tremors, weakness and headaches.   Endo/Heme/Allergies:  Does not bruise/bleed easily.  Psychiatric/Behavioral:  Negative for depression and suicidal ideas. The patient has insomnia. The patient is not nervous/anxious.     Objective:  Today's Vitals   08/31/21 1107  BP: 128/80  Pulse: 98  Temp: (!) 97.3 F (36.3 C)  SpO2: 98%  Weight: 188 lb 3.2 oz (85.4 kg)    Body mass index is 33.6 kg/m.  General appearance: alert, no distress, WD/WN, female HEENT: normocephalic, sclerae anicteric, TMs pearly, nares patent, erythema and yellow mucus noted, pharynx normal Oral cavity: MMM, no lesions Sinuses: positive frontal sinus tenderness Neck: supple, no lymphadenopathy, no thyromegaly, no masses Heart: RRR, normal S1, S2, no murmurs Lungs: CTA bilaterally, no wheezes, rhonchi, or rales Abdomen: +bs, soft, non tender, non distended, no masses, no hepatomegaly, no splenomegaly Musculoskeletal: nontender, no swelling, no obvious deformity Extremities: no edema, no cyanosis, no clubbing Pulses: 2+ symmetric, upper and lower extremities, normal cap refill Neurological: alert, oriented x 3, CN2-12 intact, strength normal upper extremities and lower extremities, sensation normal throughout, DTRs 2+ throughout, no cerebellar signs, gait normal Psychiatric: normal affect, behavior normal, pleasant   [facility]  08/31/2021

## 2021-08-31 ENCOUNTER — Ambulatory Visit (INDEPENDENT_AMBULATORY_CARE_PROVIDER_SITE_OTHER): Payer: Medicare Other | Admitting: Nurse Practitioner

## 2021-08-31 ENCOUNTER — Encounter: Payer: Self-pay | Admitting: Nurse Practitioner

## 2021-08-31 ENCOUNTER — Ambulatory Visit: Payer: Medicare Other | Admitting: Adult Health

## 2021-08-31 ENCOUNTER — Other Ambulatory Visit: Payer: Self-pay

## 2021-08-31 VITALS — BP 128/80 | HR 98 | Temp 97.3°F | Wt 188.2 lb

## 2021-08-31 DIAGNOSIS — K219 Gastro-esophageal reflux disease without esophagitis: Secondary | ICD-10-CM

## 2021-08-31 DIAGNOSIS — E039 Hypothyroidism, unspecified: Secondary | ICD-10-CM

## 2021-08-31 DIAGNOSIS — Z23 Encounter for immunization: Secondary | ICD-10-CM | POA: Diagnosis not present

## 2021-08-31 DIAGNOSIS — E782 Mixed hyperlipidemia: Secondary | ICD-10-CM | POA: Diagnosis not present

## 2021-08-31 DIAGNOSIS — E559 Vitamin D deficiency, unspecified: Secondary | ICD-10-CM | POA: Diagnosis not present

## 2021-08-31 DIAGNOSIS — C50411 Malignant neoplasm of upper-outer quadrant of right female breast: Secondary | ICD-10-CM

## 2021-08-31 DIAGNOSIS — Z170 Estrogen receptor positive status [ER+]: Secondary | ICD-10-CM

## 2021-08-31 DIAGNOSIS — I10 Essential (primary) hypertension: Secondary | ICD-10-CM

## 2021-08-31 DIAGNOSIS — R7309 Other abnormal glucose: Secondary | ICD-10-CM | POA: Diagnosis not present

## 2021-08-31 DIAGNOSIS — Z0000 Encounter for general adult medical examination without abnormal findings: Secondary | ICD-10-CM

## 2021-08-31 DIAGNOSIS — R6889 Other general symptoms and signs: Secondary | ICD-10-CM

## 2021-08-31 DIAGNOSIS — F419 Anxiety disorder, unspecified: Secondary | ICD-10-CM

## 2021-08-31 DIAGNOSIS — J069 Acute upper respiratory infection, unspecified: Secondary | ICD-10-CM

## 2021-08-31 DIAGNOSIS — E876 Hypokalemia: Secondary | ICD-10-CM

## 2021-08-31 DIAGNOSIS — Z0001 Encounter for general adult medical examination with abnormal findings: Secondary | ICD-10-CM

## 2021-08-31 DIAGNOSIS — M797 Fibromyalgia: Secondary | ICD-10-CM

## 2021-08-31 DIAGNOSIS — G4700 Insomnia, unspecified: Secondary | ICD-10-CM

## 2021-08-31 DIAGNOSIS — Z853 Personal history of malignant neoplasm of breast: Secondary | ICD-10-CM

## 2021-08-31 DIAGNOSIS — Z79899 Other long term (current) drug therapy: Secondary | ICD-10-CM

## 2021-08-31 MED ORDER — PROMETHAZINE-DM 6.25-15 MG/5ML PO SYRP: 5.0000 mL | ORAL_SOLUTION | Freq: Four times a day (QID) | ORAL | 1 refills | Status: DC | PRN

## 2021-08-31 MED ORDER — AZITHROMYCIN 250 MG PO TABS: ORAL_TABLET | ORAL | 1 refills | Status: DC

## 2021-08-31 MED ORDER — BACLOFEN 10 MG PO TABS: 10.0000 mg | ORAL_TABLET | Freq: Two times a day (BID) | ORAL | 1 refills | Status: DC

## 2021-08-31 NOTE — Patient Instructions (Signed)

## 2021-09-01 ENCOUNTER — Other Ambulatory Visit: Payer: Self-pay | Admitting: Nurse Practitioner

## 2021-09-01 DIAGNOSIS — R7309 Other abnormal glucose: Secondary | ICD-10-CM

## 2021-09-01 LAB — LIPID PANEL
Cholesterol: 122 mg/dL (ref <200)
HDL: 55 mg/dL (ref >=50)
LDL Cholesterol (Calc): 50 mg/dL (calc)
Non-HDL Cholesterol (Calc): 67 mg/dL (calc) (ref <130)
Total CHOL/HDL Ratio: 2.2 (calc) (ref <5.0)
Triglycerides: 86 mg/dL (ref <150)

## 2021-09-01 LAB — COMPLETE METABOLIC PANEL WITH GFR
AG Ratio: 1.7 (calc) (ref 1.0–2.5)
ALT: 38 U/L — ABNORMAL HIGH (ref 6–29)
AST: 30 U/L (ref 10–35)
Albumin: 3.8 g/dL (ref 3.6–5.1)
Alkaline phosphatase (APISO): 60 U/L (ref 37–153)
BUN: 7 mg/dL (ref 7–25)
CO2: 23 mmol/L (ref 20–32)
Calcium: 8.5 mg/dL — ABNORMAL LOW (ref 8.6–10.4)
Chloride: 110 mmol/L (ref 98–110)
Creat: 0.56 mg/dL (ref 0.50–1.05)
Globulin: 2.2 g/dL (calc) (ref 1.9–3.7)
Glucose, Bld: 159 mg/dL — ABNORMAL HIGH (ref 65–99)
Potassium: 4.3 mmol/L (ref 3.5–5.3)
Sodium: 142 mmol/L (ref 135–146)
Total Bilirubin: 0.4 mg/dL (ref 0.2–1.2)
Total Protein: 6 g/dL — ABNORMAL LOW (ref 6.1–8.1)
eGFR: 102 mL/min/{1.73_m2} (ref >=60)

## 2021-09-01 LAB — CBC WITH DIFFERENTIAL/PLATELET
Absolute Monocytes: 665 cells/uL (ref 200–950)
Basophils Absolute: 38 cells/uL (ref 0–200)
Basophils Relative: 0.4 %
Eosinophils Absolute: 67 cells/uL (ref 15–500)
Eosinophils Relative: 0.7 %
HCT: 41 % (ref 35.0–45.0)
Hemoglobin: 13.2 g/dL (ref 11.7–15.5)
Lymphs Abs: 4152 cells/uL — ABNORMAL HIGH (ref 850–3900)
MCH: 28.1 pg (ref 27.0–33.0)
MCHC: 32.2 g/dL (ref 32.0–36.0)
MCV: 87.4 fL (ref 80.0–100.0)
MPV: 10.8 fL (ref 7.5–12.5)
Monocytes Relative: 7 %
Neutro Abs: 4579 cells/uL (ref 1500–7800)
Neutrophils Relative %: 48.2 %
Platelets: 266 10*3/uL (ref 140–400)
RBC: 4.69 10*6/uL (ref 3.80–5.10)
RDW: 13.5 % (ref 11.0–15.0)
Total Lymphocyte: 43.7 %
WBC: 9.5 10*3/uL (ref 3.8–10.8)

## 2021-09-01 LAB — HEMOGLOBIN A1C
Hgb A1c MFr Bld: 6.9 % of total Hgb — ABNORMAL HIGH (ref <5.7)
Mean Plasma Glucose: 151 mg/dL
eAG (mmol/L): 8.4 mmol/L

## 2021-09-01 LAB — TSH: TSH: 3.97 mIU/L (ref 0.40–4.50)

## 2021-09-01 MED ORDER — METFORMIN HCL 500 MG PO TABS: ORAL_TABLET | ORAL | 2 refills | Status: DC

## 2021-09-05 ENCOUNTER — Other Ambulatory Visit: Payer: Self-pay

## 2021-09-05 DIAGNOSIS — R7309 Other abnormal glucose: Secondary | ICD-10-CM

## 2021-09-05 MED ORDER — FREESTYLE LITE TEST VI STRP: ORAL_STRIP | 12 refills | Status: AC

## 2021-09-05 MED ORDER — LANCETS MISC: 11 refills | Status: AC

## 2021-09-05 MED ORDER — BLOOD GLUCOSE MONITORING SUPPL W/DEVICE KIT: PACK | 0 refills | Status: AC

## 2021-09-08 ENCOUNTER — Encounter: Payer: Self-pay | Admitting: Surgery

## 2021-09-08 ENCOUNTER — Ambulatory Visit (INDEPENDENT_AMBULATORY_CARE_PROVIDER_SITE_OTHER): Payer: Medicare Other | Admitting: Surgery

## 2021-09-08 ENCOUNTER — Other Ambulatory Visit: Payer: Self-pay

## 2021-09-08 VITALS — BP 103/65 | HR 84 | Ht 60.75 in | Wt 188.2 lb

## 2021-09-08 DIAGNOSIS — M4802 Spinal stenosis, cervical region: Secondary | ICD-10-CM

## 2021-09-12 NOTE — Progress Notes (Signed)
Surgical Instructions    Your procedure is scheduled on Friday, October 21st, 2022.   Report to [facility] Main Entrance "A" at 05:30 A.M., then check in with the Admitting office.  Call this number if you have problems the morning of surgery:  [phone]   If you have any questions prior to your surgery date call [phone]: Open Monday-Friday 8am-4pm    Remember:  Do not eat after midnight the night before your surgery  You may drink clear liquids until 04:30 the morning of your surgery.   Clear liquids allowed are: Water, Non-Citrus Juices (without pulp), Carbonated Beverages, Clear Tea, Black Coffee ONLY (NO MILK, CREAM OR POWDERED CREAMER of any kind), and Gatorade    Take these medicines the morning of surgery with A SIP OF WATER:  ezetimibe (ZETIA)  levothyroxine (SYNTHROID) rosuvastatin (CRESTOR)  venlafaxine XR (EFFEXOR-XR)  cholestyramine (QUESTRAN)   If needed:  acetaminophen (TYLENOL) ALPRAZolam (XANAX)  cetirizine (ZYRTEC)  famotidine (PEPCID) fluticasone (FLONASE) gabapentin (NEURONTIN)  hyoscyamine (LEVSIN SL)  meclizine (ANTIVERT) promethazine-dextromethorphan (PROMETHAZINE-DM)  rizatriptan (MAXALT)  As of today, STOP taking any Aspirin (unless otherwise instructed by your surgeon) Aleve, Naproxen, Ibuprofen, Motrin, Advil, Goody's, BC's, all herbal medications, fish oil, and all vitamins.   WHAT DO I DO ABOUT MY DIABETES MEDICATION?   Do not take metFORMIN (GLUCOPHAGE) the morning of surgery.   HOW TO MANAGE YOUR DIABETES BEFORE AND AFTER SURGERY  Why is it important to control my blood sugar before and after surgery? Improving blood sugar levels before and after surgery helps healing and can limit problems. A way of improving blood sugar control is eating a healthy diet by:  Eating less sugar and carbohydrates  Increasing activity/exercise  Talking with your doctor about reaching your blood sugar goals High blood sugars (greater than 180  mg/dL) can raise your risk of infections and slow your recovery, so you will need to focus on controlling your diabetes during the weeks before surgery. Make sure that the doctor who takes care of your diabetes knows about your planned surgery including the date and location.  How do I manage my blood sugar before surgery? Check your blood sugar at least 4 times a day, starting 2 days before surgery, to make sure that the level is not too high or low.  Check your blood sugar the morning of your surgery when you wake up and every 2 hours until you get to the Short Stay unit.  If your blood sugar is less than 70 mg/dL, you will need to treat for low blood sugar: Do not take insulin. Treat a low blood sugar (less than 70 mg/dL) with  cup of clear juice (cranberry or apple), 4 glucose tablets, OR glucose gel. Recheck blood sugar in 15 minutes after treatment (to make sure it is greater than 70 mg/dL). If your blood sugar is not greater than 70 mg/dL on recheck, call [phone] for further instructions. Report your blood sugar to the short stay nurse when you get to Short Stay.  If you are admitted to the hospital after surgery: Your blood sugar will be checked by the staff and you will probably be given insulin after surgery (instead of oral diabetes medicines) to make sure you have good blood sugar levels. The goal for blood sugar control after surgery is 80-180 mg/dL.    After your COVID test   You are not required to quarantine however you are required to wear a well-fitting mask when you are out and around  people not in your household.  If your mask becomes wet or soiled, replace with a new one.  Wash your hands often with soap and water for 20 seconds or clean your hands with an alcohol-based hand sanitizer that contains at least 60% alcohol.  Do not share personal items.  Notify your provider: if you are in close contact with someone who has COVID  or if you develop a fever of 100.4  or greater, sneezing, cough, sore throat, shortness of breath or body aches.    The day of surgery:          Do not wear jewelry or makeup Do not wear lotions, powders, perfumes, or deodorant. Do not shave 48 hours prior to surgery.   Do not bring valuables to the hospital. DO Not wear nail polish, gel polish, artificial nails, or any other type of covering on natural nails including finger and toenails. If patients have artificial nails, gel coating, etc. that need to be removed by a nail salon, please have this removed prior to surgery or surgery may need to be canceled/delayed if the surgeon/ anesthesia feels like the patient is unable to be adequately monitored.              [facility] is not responsible for any belongings or valuables.  Do NOT Smoke (Tobacco/Vaping)  24 hours prior to your procedure  If you use a CPAP at night, you may bring your mask for your overnight stay.   Contacts, glasses, hearing aids, dentures or partials may not be worn into surgery, please bring cases for these belongings   For patients admitted to the hospital, discharge time will be determined by your treatment team.   Patients discharged the day of surgery will not be allowed to drive home, and someone needs to stay with them for 24 hours.  NO VISITORS WILL BE ALLOWED IN PRE-OP WHERE PATIENTS ARE PREPPED FOR SURGERY.  ONLY 1 SUPPORT PERSON MAY BE PRESENT IN THE WAITING ROOM WHILE YOU ARE IN SURGERY.  IF YOU ARE TO BE ADMITTED, ONCE YOU ARE IN YOUR ROOM YOU WILL BE ALLOWED TWO (2) VISITORS. 1 (ONE) VISITOR MAY STAY OVERNIGHT BUT MUST ARRIVE TO THE ROOM BY 8pm.  Minor children may have two parents present. Special consideration for safety and communication needs will be reviewed on a case by case basis.  Special instructions:    Oral Hygiene is also important to reduce your risk of infection.  Remember - BRUSH YOUR TEETH THE MORNING OF SURGERY WITH YOUR REGULAR TOOTHPASTE   [facility]- Preparing For  Surgery  Before surgery, you can play an important role. Because skin is not sterile, your skin needs to be as free of germs as possible. You can reduce the number of germs on your skin by washing with CHG (chlorahexidine gluconate) Soap before surgery.  CHG is an antiseptic cleaner which kills germs and bonds with the skin to continue killing germs even after washing.     Please do not use if you have an allergy to CHG or antibacterial soaps. If your skin becomes reddened/irritated stop using the CHG.  Do not shave (including legs and underarms) for at least 48 hours prior to first CHG shower. It is OK to shave your face.  Please follow these instructions carefully.     Shower the NIGHT BEFORE SURGERY and the MORNING OF SURGERY with CHG Soap.   If you chose to wash your hair, wash your hair first as usual  with your normal shampoo. After you shampoo, rinse your hair and body thoroughly to remove the shampoo.  Then [employer] and genitals (private parts) with your normal soap and rinse thoroughly to remove soap.  After that Use CHG Soap as you would any other liquid soap. You can apply CHG directly to the skin and wash gently with a scrungie or a clean washcloth.   Apply the CHG Soap to your body ONLY FROM THE NECK DOWN.  Do not use on open wounds or open sores. Avoid contact with your eyes, ears, mouth and genitals (private parts). Wash Face and genitals (private parts)  with your normal soap.   Wash thoroughly, paying special attention to the area where your surgery will be performed.  Thoroughly rinse your body with warm water from the neck down.  DO NOT shower/wash with your normal soap after using and rinsing off the CHG Soap.  Pat yourself dry with a CLEAN TOWEL.  Wear CLEAN PAJAMAS to bed the night before surgery  Place CLEAN SHEETS on your bed the night before your surgery  DO NOT SLEEP WITH PETS.   Day of Surgery:  Take a shower with CHG soap. Wear Clean/Comfortable  clothing the morning of surgery Do not apply any deodorants/lotions.   Remember to brush your teeth WITH YOUR REGULAR TOOTHPASTE.   Please read over the following fact sheets that you were given.

## 2021-09-13 ENCOUNTER — Encounter (HOSPITAL_COMMUNITY): Payer: Self-pay

## 2021-09-13 ENCOUNTER — Other Ambulatory Visit: Payer: Self-pay

## 2021-09-13 ENCOUNTER — Encounter (HOSPITAL_COMMUNITY)
Admission: RE | Admit: 2021-09-13 | Discharge: 2021-09-13 | Disposition: A | Discharge status: Home / Self Care | Payer: Medicare Other | Source: Ambulatory Visit | Attending: Orthopaedic Surgery | Admitting: Orthopaedic Surgery

## 2021-09-13 DIAGNOSIS — Z20822 Contact with and (suspected) exposure to covid-19: Secondary | ICD-10-CM | POA: Insufficient documentation

## 2021-09-13 DIAGNOSIS — Z01818 Encounter for other preprocedural examination: Secondary | ICD-10-CM

## 2021-09-13 DIAGNOSIS — E119 Type 2 diabetes mellitus without complications: Secondary | ICD-10-CM | POA: Diagnosis not present

## 2021-09-13 DIAGNOSIS — Z01812 Encounter for preprocedural laboratory examination: Secondary | ICD-10-CM | POA: Insufficient documentation

## 2021-09-13 HISTORY — DX: Type 2 diabetes mellitus without complications: E11.9

## 2021-09-13 LAB — SURGICAL PCR SCREEN
MRSA, PCR: NEGATIVE
Staphylococcus aureus: NEGATIVE

## 2021-09-13 LAB — GLUCOSE, CAPILLARY: Glucose-Capillary: 90 mg/dL (ref 70–99)

## 2021-09-13 LAB — SARS CORONAVIRUS 2 (TAT 6-24 HRS): SARS Coronavirus 2: NEGATIVE

## 2021-09-13 NOTE — Progress Notes (Signed)
PCP - Dr. Unk Pinto Cardiologist - patient denies  PPM/ICD - n/a Device Orders -  Rep Notified -   Chest x-ray - n/a EKG - 02/02/21 Stress Test - 07/03/13 ECHO - 06/16/13 Cardiac Cath - patient denies  Sleep Study - patient denies, negative stop bang CPAP -   Fasting Blood Sugar - unsure, patient states she does not check CBG before eating Checks Blood Sugar 2 times a day  Blood Thinner Instructions: n/a Aspirin Instructions: n/a  ERAS Protcol - clears until 0430 PRE-SURGERY Ensure or G2- G2 given  COVID TEST- at PAT appointment   Anesthesia review: n/a  Patient denies shortness of breath, fever, cough and chest pain at PAT appointment   All instructions explained to the patient, with a verbal understanding of the material. Patient agrees to go over the instructions while at home for a better understanding. Patient also instructed to self quarantine after being tested for COVID-19. The opportunity to ask questions was provided.

## 2021-09-13 NOTE — Progress Notes (Addendum)
Surgical Instructions    Your procedure is scheduled on Friday, October 21st, 2022.   Report to [facility] Main Entrance "A" at 05:30 A.M., then check in with the Admitting office.  Call this number if you have problems the morning of surgery:  [phone]   If you have any questions prior to your surgery date call [phone]: Open Monday-Friday 8am-4pm    Remember:  Do not eat after midnight the night before your surgery  You may drink clear liquids until 04:30 the morning of your surgery.   Clear liquids allowed are: Water, Non-Citrus Juices (without pulp), Carbonated Beverages, Clear Tea, Black Coffee ONLY (NO MILK, CREAM OR POWDERED CREAMER of any kind), and Gatorade   Enhanced Recovery after Surgery for Orthopedics Enhanced Recovery after Surgery is a protocol used to improve the stress on your body and your recovery after surgery.  Patient Instructions The day of surgery (if you have diabetes):  Drink ONE small 12 oz bottle of G2 by 04:30 am the morning of surgery This bottle was given to you during your hospital  pre-op appointment visit.  Nothing else to drink after completing the  Small 12 oz bottle of G2.         If you have questions, please contact your surgeon's office.     Take these medicines the morning of surgery with A SIP OF WATER:  ezetimibe (ZETIA)  levothyroxine (SYNTHROID) rosuvastatin (CRESTOR)  venlafaxine XR (EFFEXOR-XR)  cholestyramine (QUESTRAN)   If needed:  acetaminophen (TYLENOL) ALPRAZolam (XANAX)  cetirizine (ZYRTEC)  famotidine (PEPCID) fluticasone (FLONASE) gabapentin (NEURONTIN)  hyoscyamine (LEVSIN SL)  meclizine (ANTIVERT) promethazine-dextromethorphan (PROMETHAZINE-DM)  rizatriptan (MAXALT)  As of today, STOP taking any Aspirin (unless otherwise instructed by your surgeon) Aleve, Naproxen, Ibuprofen, Motrin, Advil, Goody's, BC's, all herbal medications, fish oil, and all vitamins.   WHAT DO I DO ABOUT MY DIABETES  MEDICATION?   Do not take metFORMIN (GLUCOPHAGE) the morning of surgery.   HOW TO MANAGE YOUR DIABETES BEFORE AND AFTER SURGERY  Why is it important to control my blood sugar before and after surgery? Improving blood sugar levels before and after surgery helps healing and can limit problems. A way of improving blood sugar control is eating a healthy diet by:  Eating less sugar and carbohydrates  Increasing activity/exercise  Talking with your doctor about reaching your blood sugar goals High blood sugars (greater than 180 mg/dL) can raise your risk of infections and slow your recovery, so you will need to focus on controlling your diabetes during the weeks before surgery. Make sure that the doctor who takes care of your diabetes knows about your planned surgery including the date and location.  How do I manage my blood sugar before surgery? Check your blood sugar at least 4 times a day, starting 2 days before surgery, to make sure that the level is not too high or low.  Check your blood sugar the morning of your surgery when you wake up and every 2 hours until you get to the Short Stay unit.  If your blood sugar is less than 70 mg/dL, you will need to treat for low blood sugar: Do not take insulin. Treat a low blood sugar (less than 70 mg/dL) with  cup of clear juice (cranberry or apple), 4 glucose tablets, OR glucose gel. Recheck blood sugar in 15 minutes after treatment (to make sure it is greater than 70 mg/dL). If your blood sugar is not greater than 70 mg/dL on recheck, call [phone]  for further instructions. Report your blood sugar to the short stay nurse when you get to Short Stay.  If you are admitted to the hospital after surgery: Your blood sugar will be checked by the staff and you will probably be given insulin after surgery (instead of oral diabetes medicines) to make sure you have good blood sugar levels. The goal for blood sugar control after surgery is 80-180  mg/dL.    After your COVID test   You are not required to quarantine however you are required to wear a well-fitting mask when you are out and around people not in your household.  If your mask becomes wet or soiled, replace with a new one.  Wash your hands often with soap and water for 20 seconds or clean your hands with an alcohol-based hand sanitizer that contains at least 60% alcohol.  Do not share personal items.  Notify your provider: if you are in close contact with someone who has COVID  or if you develop a fever of 100.4 or greater, sneezing, cough, sore throat, shortness of breath or body aches.    The day of surgery:          Do not wear jewelry or makeup Do not wear lotions, powders, perfumes, or deodorant. Do not shave 48 hours prior to surgery.   DO Not wear nail polish, gel polish, artificial nails, or any other type of covering on natural nails including finger and toenails. If patients have artificial nails, gel coating, etc. that need to be removed by a nail salon, please have this removed prior to surgery or surgery may need to be canceled/delayed if the surgeon/ anesthesia feels like the patient is unable to be adequately monitored.             Do not bring valuables to the hospital - [facility] is not responsible for any belongings or valuables.  Do NOT Smoke (Tobacco/Vaping)  24 hours prior to your procedure  If you use a CPAP at night, you may bring your mask for your overnight stay.   Contacts, glasses, hearing aids, dentures or partials may not be worn into surgery, please bring cases for these belongings   For patients admitted to the hospital, discharge time will be determined by your treatment team.   Patients discharged the day of surgery will not be allowed to drive home, and someone needs to stay with them for 24 hours.  NO VISITORS WILL BE ALLOWED IN PRE-OP WHERE PATIENTS ARE PREPPED FOR SURGERY.  ONLY 1 SUPPORT PERSON MAY BE PRESENT IN THE WAITING  ROOM WHILE YOU ARE IN SURGERY.  IF YOU ARE TO BE ADMITTED, ONCE YOU ARE IN YOUR ROOM YOU WILL BE ALLOWED TWO (2) VISITORS. 1 (ONE) VISITOR MAY STAY OVERNIGHT BUT MUST ARRIVE TO THE ROOM BY 8pm.  Minor children may have two parents present. Special consideration for safety and communication needs will be reviewed on a case by case basis.  Special instructions:    Oral Hygiene is also important to reduce your risk of infection.  Remember - BRUSH YOUR TEETH THE MORNING OF SURGERY WITH YOUR REGULAR TOOTHPASTE   [place]- Preparing For Surgery  Before surgery, you can play an important role. Because skin is not sterile, your skin needs to be as free of germs as possible. You can reduce the number of germs on your skin by washing with CHG (chlorahexidine gluconate) Soap before surgery.  CHG is an antiseptic cleaner which kills germs and  bonds with the skin to continue killing germs even after washing.     Please do not use if you have an allergy to CHG or antibacterial soaps. If your skin becomes reddened/irritated stop using the CHG.  Do not shave (including legs and underarms) for at least 48 hours prior to first CHG shower. It is OK to shave your face.  Please follow these instructions carefully.     Shower the NIGHT BEFORE SURGERY and the MORNING OF SURGERY with CHG Soap.   If you chose to wash your hair, wash your hair first as usual with your normal shampoo. After you shampoo, rinse your hair and body thoroughly to remove the shampoo.  Then [employer] and genitals (private parts) with your normal soap and rinse thoroughly to remove soap.  After that Use CHG Soap as you would any other liquid soap. You can apply CHG directly to the skin and wash gently with a scrungie or a clean washcloth.   Apply the CHG Soap to your body ONLY FROM THE NECK DOWN.  Do not use on open wounds or open sores. Avoid contact with your eyes, ears, mouth and genitals (private parts). Wash Face and genitals (private  parts)  with your normal soap.   Wash thoroughly, paying special attention to the area where your surgery will be performed.  Thoroughly rinse your body with warm water from the neck down.  DO NOT shower/wash with your normal soap after using and rinsing off the CHG Soap.  Pat yourself dry with a CLEAN TOWEL.  Wear CLEAN PAJAMAS to bed the night before surgery  Place CLEAN SHEETS on your bed the night before your surgery  DO NOT SLEEP WITH PETS.   Day of Surgery:  Take a shower with CHG soap. Wear Clean/Comfortable clothing the morning of surgery Do not apply any deodorants/lotions.   Remember to brush your teeth WITH YOUR REGULAR TOOTHPASTE.   Please read over the following fact sheets that you were given.

## 2021-09-15 ENCOUNTER — Other Ambulatory Visit: Payer: Self-pay | Admitting: Internal Medicine

## 2021-09-15 DIAGNOSIS — F419 Anxiety disorder, unspecified: Secondary | ICD-10-CM

## 2021-09-15 MED ORDER — ALPRAZOLAM 0.5 MG PO TABS: ORAL_TABLET | ORAL | 0 refills | Status: DC

## 2021-09-15 NOTE — Anesthesia Preprocedure Evaluation (Addendum)
Anesthesia Evaluation  Patient identified by MRN, date of birth, ID band Patient awake    Reviewed: Allergy & Precautions, NPO status , Patient's Chart, lab work & pertinent test results  Airway Mallampati: I  TM Distance: >3 FB Neck ROM: Full    Dental no notable dental hx.    Pulmonary former smoker,    Pulmonary exam normal        Cardiovascular hypertension, Pt. on medications Normal cardiovascular exam     Neuro/Psych    GI/Hepatic GERD  Medicated,  Endo/Other  diabetes, Type 2, Oral Hypoglycemic AgentsHypothyroidism   Renal/GU   negative genitourinary   Musculoskeletal   Abdominal (+) + obese,   Peds  Hematology   Anesthesia Other Findings   Reproductive/Obstetrics                            Anesthesia Physical Anesthesia Plan  ASA: 2  Anesthesia Plan: General   Post-op Pain Management:    Induction: Intravenous  PONV Risk Score and Plan: 4 or greater and Ondansetron and Midazolam  Airway Management Planned: Oral ETT  Additional Equipment: None  Intra-op Plan:   Post-operative Plan: Extubation in OR  Informed Consent:   Plan Discussed with:   Anesthesia Plan Comments:         Anesthesia Quick Evaluation

## 2021-09-15 NOTE — H&P (Signed)
Desiree Anderson is an 65 y.o. female.   Chief Complaint: Neck pain and upper extremity radiculopathy HPI: 65 year old female with history of C5-6 HNP/stenosis and above complaint presented for preop evaluation.  Symptoms unchanged from previous visit and she wanted to proceed with C5-6 ACDF is scheduled.  Past Medical History:  Diagnosis Date   Anxiety    Breast cancer ([place])    Complication of anesthesia    hypotension   DDD (degenerative disc disease), lumbar    Diabetes mellitus without complication (Desiree Anderson)    Erosion of vaginal mesh ([facility])    Fibromyalgia    GERD (gastroesophageal reflux disease)    History of gastric ulcer    History of radiation therapy 07/06/16- 08/22/16   Right Breast   Hypertension    Hypothyroidism    Personal history of radiation therapy    Rectocele     Past Surgical History:  Procedure Laterality Date   BLADDER TACK  2003   BREAST BIOPSY Right 04/18/2016   BREAST LUMPECTOMY Right 05/18/2016   Breast mass removal Left 1985   Benign   CARDIOVASCULAR STRESS TEST  07-02-2013  DR DALTON MCLEAN   LOW RISK NUCLEAR STUDY WITH A SMALL, MILD APICAL SEPTAL FIXED PERFUSION DEFECT .  GIVEN NORMAL WALL FUNCTION , SUSPECT REPRESENTS ATTENUATION/  EF 74%/  NO ISCHEMIA   CATARACT EXTRACTION W/ INTRAOCULAR LENS IMPLANT Left 01/2014   CATARACT EXTRACTION W/ INTRAOCULAR LENS IMPLANT Right 12/2017   Dr. Manuella Ghazi   CHOLECYSTECTOMY N/A 12/10/2017   Procedure: LAPAROSCOPIC CHOLECYSTECTOMY;  Surgeon: Rolm Bookbinder, MD;  Location: [place];  Service: General;  Laterality: N/A;   CYSTO/  HYDRODISTENTION/  BLADDER BX/   INSTILLATION THERAPY  08-29-2010   EXCISION OF MESH N/A 03/23/2014   Procedure: EXCISION OF VAGINAL AND PERI-URETHRAL MESH, EXTRACTION FROM URETHRA TO APEX, insertion of XENFORM in the vaginal vault;  Surgeon: Ailene Rud, MD;  Location: [facility];  Service: Urology;  Laterality: N/A;   EXCISION VAGINAL CYST N/A 04/06/2014   Procedure: VAGINAL  SPECULUM EXAMINATION/POSSIBLE DRAINAGE OF HEMATOMA;  Surgeon: Ailene Rud, MD;  Location: [facility];  Service: Urology;  Laterality: N/A;   INCONTINENCE SURGERY  2009  &  2005   RADIOACTIVE SEED GUIDED PARTIAL MASTECTOMY WITH AXILLARY SENTINEL LYMPH NODE BIOPSY Right 05/18/2016   Procedure: RADIOACTIVE SEED GUIDED RIGHT BREAST LUMPECTOMY WITH AXILLARY SENTINEL LYMPH NODE BIOPSY;  Surgeon: Rolm Bookbinder, MD;  Location: [place];  Service: General;  Laterality: Right;  RADIOACTIVE SEED GUIDED RIGHT BREAST LUMPECTOMY WITH AXILLARY SENTINEL LYMPH NODE BIOPSY   TRANSTHORACIC ECHOCARDIOGRAM  06-16-2013   GRADE I DIASTOLIC DYSFUNCTION/  EF 55-60%/  MILD AR/  TRIVIAL MR  &  TR   VAGINAL HYSTERECTOMY  2000   w/  unilateral salpingoophorectomy   VEIN BYPASS SURGERY  AGE 8   RIGHT LEG    Family History  Problem Relation Age of Onset   Diabetes Mother    Diverticulitis Mother    Lung cancer Brother    Intestinal polyp Sister    Diverticulitis Sister    Social History:  reports that she quit smoking about 25 years ago. Her smoking use included cigarettes. Her smokeless tobacco use includes snuff. She reports that she does not drink alcohol and does not use drugs.  Allergies:  Allergies  Allergen Reactions   Lipitor [Atorvastatin] Other (See Comments)    Muscle and legs aches   Shellfish Allergy Diarrhea and Nausea And Vomiting  Betadine [Povidone Iodine] Rash   Iodine     Pt unsure of reaction     No medications prior to admission.    No results found for this or any previous visit (from the past 48 hour(s)). No results found.  Review of Systems  Constitutional:  Positive for activity change.  HENT: Negative.    Respiratory: Negative.    Cardiovascular: Negative.   Gastrointestinal: Negative.   Genitourinary: Negative.   Musculoskeletal:  Positive for neck pain and neck stiffness.  Neurological:  Positive for numbness.   Psychiatric/Behavioral: Negative.     There were no vitals taken for this visit. Physical Exam HENT:     Head: Normocephalic and atraumatic.     Nose: Nose normal.  Eyes:     Extraocular Movements: Extraocular movements intact.  Cardiovascular:     Rate and Rhythm: Regular rhythm.  Pulmonary:     Effort: Pulmonary effort is normal. No respiratory distress.  Abdominal:     General: There is no distension.  Musculoskeletal:        General: Tenderness present.  Neurological:     Mental Status: She is alert and oriented to person, place, and time.  Psychiatric:        Mood and Affect: Mood normal.     Assessment/Plan C5-6 HNP/stenosis   We will proceed with C5-6 ACDF as scheduled.  Surgical procedure discussed along with potential rehab/recovery time.  All questions answered.  Benjiman Core, PA-C 09/15/2021, 4:48 PM

## 2021-09-16 ENCOUNTER — Ambulatory Visit (HOSPITAL_COMMUNITY): Payer: Medicare Other | Admitting: Anesthesiology

## 2021-09-16 ENCOUNTER — Ambulatory Visit (HOSPITAL_COMMUNITY): Payer: Medicare Other

## 2021-09-16 ENCOUNTER — Observation Stay (HOSPITAL_COMMUNITY)
Admission: RE | Admit: 2021-09-16 | Discharge: 2021-09-17 | Disposition: A | Discharge status: Home / Self Care | Payer: Medicare Other | Source: Ambulatory Visit | Attending: Orthopaedic Surgery | Admitting: Orthopaedic Surgery

## 2021-09-16 ENCOUNTER — Encounter (HOSPITAL_COMMUNITY): Payer: Self-pay | Admitting: Orthopaedic Surgery

## 2021-09-16 ENCOUNTER — Encounter (HOSPITAL_COMMUNITY)
Admission: RE | Disposition: A | Discharge status: Home / Self Care | Payer: Self-pay | Source: Ambulatory Visit | Attending: Orthopaedic Surgery

## 2021-09-16 ENCOUNTER — Other Ambulatory Visit: Payer: Self-pay

## 2021-09-16 DIAGNOSIS — Z853 Personal history of malignant neoplasm of breast: Secondary | ICD-10-CM | POA: Diagnosis not present

## 2021-09-16 DIAGNOSIS — Z79899 Other long term (current) drug therapy: Secondary | ICD-10-CM | POA: Insufficient documentation

## 2021-09-16 DIAGNOSIS — I10 Essential (primary) hypertension: Secondary | ICD-10-CM | POA: Insufficient documentation

## 2021-09-16 DIAGNOSIS — Z419 Encounter for procedure for purposes other than remedying health state, unspecified: Secondary | ICD-10-CM

## 2021-09-16 DIAGNOSIS — M4722 Other spondylosis with radiculopathy, cervical region: Principal | ICD-10-CM | POA: Diagnosis present

## 2021-09-16 DIAGNOSIS — E039 Hypothyroidism, unspecified: Secondary | ICD-10-CM | POA: Diagnosis not present

## 2021-09-16 DIAGNOSIS — E559 Vitamin D deficiency, unspecified: Secondary | ICD-10-CM | POA: Diagnosis not present

## 2021-09-16 DIAGNOSIS — E119 Type 2 diabetes mellitus without complications: Secondary | ICD-10-CM | POA: Insufficient documentation

## 2021-09-16 DIAGNOSIS — M4322 Fusion of spine, cervical region: Secondary | ICD-10-CM | POA: Diagnosis not present

## 2021-09-16 DIAGNOSIS — Z981 Arthrodesis status: Secondary | ICD-10-CM | POA: Diagnosis not present

## 2021-09-16 DIAGNOSIS — Z01818 Encounter for other preprocedural examination: Secondary | ICD-10-CM

## 2021-09-16 DIAGNOSIS — M50122 Cervical disc disorder at C5-C6 level with radiculopathy: Secondary | ICD-10-CM | POA: Diagnosis not present

## 2021-09-16 DIAGNOSIS — Z87891 Personal history of nicotine dependence: Secondary | ICD-10-CM | POA: Diagnosis not present

## 2021-09-16 DIAGNOSIS — M4802 Spinal stenosis, cervical region: Secondary | ICD-10-CM | POA: Diagnosis present

## 2021-09-16 HISTORY — PX: ANTERIOR CERVICAL DECOMP/DISCECTOMY FUSION: SHX1161

## 2021-09-16 LAB — GLUCOSE, CAPILLARY
Glucose-Capillary: 88 mg/dL (ref 70–99)
Glucose-Capillary: 108 mg/dL — ABNORMAL HIGH (ref 70–99)
Glucose-Capillary: 188 mg/dL — ABNORMAL HIGH (ref 70–99)
Glucose-Capillary: 193 mg/dL — ABNORMAL HIGH (ref 70–99)

## 2021-09-16 SURGERY — ANTERIOR CERVICAL DECOMPRESSION/DISCECTOMY FUSION 1 LEVEL
Anesthesia: General

## 2021-09-16 MED ORDER — METHOCARBAMOL 500 MG PO TABS
500.0000 mg | ORAL_TABLET | Freq: Four times a day (QID) | ORAL | Status: DC | PRN
  Administered 2021-09-16 – 2021-09-17 (×3): 500 mg via ORAL
  Filled 2021-09-16 (×3): qty 1

## 2021-09-16 MED ORDER — FENTANYL CITRATE (PF) 100 MCG/2ML IJ SOLN
INTRAMUSCULAR | Status: DC | PRN
  Administered 2021-09-16: 150 ug via INTRAVENOUS

## 2021-09-16 MED ORDER — EZETIMIBE 10 MG PO TABS
10.0000 mg | ORAL_TABLET | Freq: Every day | ORAL | Status: DC
  Filled 2021-09-16 (×2): qty 1

## 2021-09-16 MED ORDER — OXYCODONE HCL 5 MG PO TABS
5.0000 mg | ORAL_TABLET | ORAL | Status: DC | PRN
  Administered 2021-09-16 (×2): 5 mg via ORAL
  Filled 2021-09-16 (×2): qty 1

## 2021-09-16 MED ORDER — LIDOCAINE HCL (CARDIAC) PF 100 MG/5ML IV SOSY
PREFILLED_SYRINGE | INTRAVENOUS | Status: DC | PRN
Start: 2021-09-16 — End: 2021-09-16
  Administered 2021-09-16: 100 mg via INTRAVENOUS

## 2021-09-16 MED ORDER — SODIUM CHLORIDE 0.9% FLUSH
3.0000 mL | Freq: Two times a day (BID) | INTRAVENOUS | Status: DC
  Administered 2021-09-16 (×2): 3 mL via INTRAVENOUS

## 2021-09-16 MED ORDER — MIDAZOLAM HCL 5 MG/5ML IJ SOLN
INTRAMUSCULAR | Status: DC | PRN
  Administered 2021-09-16: 2 mg via INTRAVENOUS

## 2021-09-16 MED ORDER — ACETAMINOPHEN 10 MG/ML IV SOLN
INTRAVENOUS | Status: AC
  Filled 2021-09-16: qty 100

## 2021-09-16 MED ORDER — BUPIVACAINE-EPINEPHRINE 0.5% -1:200000 IJ SOLN
INTRAMUSCULAR | Status: AC
  Filled 2021-09-16: qty 1

## 2021-09-16 MED ORDER — FENTANYL CITRATE (PF) 250 MCG/5ML IJ SOLN
INTRAMUSCULAR | Status: AC
  Filled 2021-09-16: qty 5

## 2021-09-16 MED ORDER — MECLIZINE HCL 12.5 MG PO TABS
12.5000 mg | ORAL_TABLET | Freq: Three times a day (TID) | ORAL | Status: DC | PRN
  Filled 2021-09-16: qty 2

## 2021-09-16 MED ORDER — SODIUM CHLORIDE 0.9% FLUSH: 3.0000 mL | INTRAVENOUS | Status: DC | PRN

## 2021-09-16 MED ORDER — ACETAMINOPHEN 650 MG RE SUPP: 650.0000 mg | RECTAL | Status: DC | PRN

## 2021-09-16 MED ORDER — HYDROMORPHONE HCL 1 MG/ML IJ SOLN
INTRAMUSCULAR | Status: AC
  Filled 2021-09-16: qty 1

## 2021-09-16 MED ORDER — DEXAMETHASONE SODIUM PHOSPHATE 10 MG/ML IJ SOLN
INTRAMUSCULAR | Status: DC | PRN
  Administered 2021-09-16: 10 mg via INTRAVENOUS

## 2021-09-16 MED ORDER — METHOCARBAMOL 500 MG PO TABS: 500.0000 mg | ORAL_TABLET | Freq: Four times a day (QID) | ORAL | 0 refills | Status: AC | PRN

## 2021-09-16 MED ORDER — FLUTICASONE PROPIONATE 50 MCG/ACT NA SUSP
1.0000 | Freq: Every day | NASAL | Status: DC | PRN
  Filled 2021-09-16: qty 16

## 2021-09-16 MED ORDER — PROMETHAZINE HCL 25 MG/ML IJ SOLN: 6.2500 mg | INTRAMUSCULAR | Status: DC | PRN

## 2021-09-16 MED ORDER — METFORMIN HCL 500 MG PO TABS
500.0000 mg | ORAL_TABLET | Freq: Two times a day (BID) | ORAL | Status: DC
  Administered 2021-09-16 – 2021-09-17 (×2): 500 mg via ORAL
  Filled 2021-09-16 (×2): qty 1

## 2021-09-16 MED ORDER — PROPOFOL 10 MG/ML IV BOLUS
INTRAVENOUS | Status: DC | PRN
Start: 2021-09-16 — End: 2021-09-16
  Administered 2021-09-16: 160 mg via INTRAVENOUS

## 2021-09-16 MED ORDER — POLYETHYLENE GLYCOL 3350 17 G PO PACK
17.0000 g | PACK | Freq: Every day | ORAL | Status: DC
  Administered 2021-09-16 – 2021-09-17 (×2): 17 g via ORAL
  Filled 2021-09-16 (×2): qty 1

## 2021-09-16 MED ORDER — SODIUM CHLORIDE 0.9 % IV SOLN: 250.0000 mL | INTRAVENOUS | Status: DC

## 2021-09-16 MED ORDER — LEVOTHYROXINE SODIUM 25 MCG PO TABS: 50.0000 ug | ORAL_TABLET | ORAL | Status: DC

## 2021-09-16 MED ORDER — PHENYLEPHRINE HCL (PRESSORS) 10 MG/ML IV SOLN
INTRAVENOUS | Status: DC | PRN
  Administered 2021-09-16 (×3): 120 ug via INTRAVENOUS
  Administered 2021-09-16: 80 ug via INTRAVENOUS
  Administered 2021-09-16: 120 ug via INTRAVENOUS
  Administered 2021-09-16: 160 ug via INTRAVENOUS
  Administered 2021-09-16: 80 ug via INTRAVENOUS
  Administered 2021-09-16: 120 ug via INTRAVENOUS
  Administered 2021-09-16: 80 ug via INTRAVENOUS

## 2021-09-16 MED ORDER — SODIUM CHLORIDE 0.9 % IV SOLN: INTRAVENOUS | Status: DC

## 2021-09-16 MED ORDER — ACETAMINOPHEN 10 MG/ML IV SOLN
1000.0000 mg | Freq: Once | INTRAVENOUS | Status: DC | PRN
  Administered 2021-09-16: 1000 mg via INTRAVENOUS

## 2021-09-16 MED ORDER — OXYCODONE-ACETAMINOPHEN 5-325 MG PO TABS: 1.0000 | ORAL_TABLET | Freq: Four times a day (QID) | ORAL | 0 refills | Status: DC | PRN

## 2021-09-16 MED ORDER — LORATADINE 10 MG PO TABS
10.0000 mg | ORAL_TABLET | Freq: Every day | ORAL | Status: DC
  Administered 2021-09-16: 10 mg via ORAL
  Filled 2021-09-16: qty 1

## 2021-09-16 MED ORDER — IRBESARTAN 150 MG PO TABS
150.0000 mg | ORAL_TABLET | Freq: Every day | ORAL | Status: DC
  Administered 2021-09-16 – 2021-09-17 (×2): 150 mg via ORAL
  Filled 2021-09-16 (×2): qty 1

## 2021-09-16 MED ORDER — CHLORHEXIDINE GLUCONATE 0.12 % MT SOLN
15.0000 mL | Freq: Once | OROMUCOSAL | Status: AC
  Administered 2021-09-16: 15 mL via OROMUCOSAL
  Filled 2021-09-16: qty 15

## 2021-09-16 MED ORDER — OXYCODONE HCL 5 MG PO TABS
5.0000 mg | ORAL_TABLET | ORAL | Status: DC | PRN
  Administered 2021-09-16: 5 mg via ORAL
  Administered 2021-09-17 (×2): 10 mg via ORAL
  Filled 2021-09-16 (×2): qty 2
  Filled 2021-09-16: qty 1

## 2021-09-16 MED ORDER — CEFAZOLIN SODIUM-DEXTROSE 2-4 GM/100ML-% IV SOLN
2.0000 g | INTRAVENOUS | Status: AC
  Administered 2021-09-16: 2 g via INTRAVENOUS
  Filled 2021-09-16: qty 100

## 2021-09-16 MED ORDER — EPHEDRINE SULFATE 50 MG/ML IJ SOLN
INTRAMUSCULAR | Status: DC | PRN
  Administered 2021-09-16 (×3): 10 mg via INTRAVENOUS
  Administered 2021-09-16: 15 mg via INTRAVENOUS

## 2021-09-16 MED ORDER — LEVOTHYROXINE SODIUM 25 MCG PO TABS
50.0000 ug | ORAL_TABLET | ORAL | Status: DC
  Administered 2021-09-17: 50 ug via ORAL
  Filled 2021-09-16: qty 2

## 2021-09-16 MED ORDER — PHENOL 1.4 % MT LIQD: 1.0000 | OROMUCOSAL | Status: DC | PRN

## 2021-09-16 MED ORDER — POTASSIUM CHLORIDE CRYS ER 20 MEQ PO TBCR
20.0000 meq | EXTENDED_RELEASE_TABLET | Freq: Two times a day (BID) | ORAL | Status: DC
  Administered 2021-09-16 – 2021-09-17 (×3): 20 meq via ORAL
  Filled 2021-09-16 (×3): qty 1

## 2021-09-16 MED ORDER — METHOCARBAMOL 1000 MG/10ML IJ SOLN
500.0000 mg | Freq: Four times a day (QID) | INTRAVENOUS | Status: DC | PRN
  Filled 2021-09-16: qty 5

## 2021-09-16 MED ORDER — HYDROMORPHONE HCL 1 MG/ML IJ SOLN: 0.5000 mg | INTRAMUSCULAR | Status: DC | PRN

## 2021-09-16 MED ORDER — LACTATED RINGERS IV SOLN: INTRAVENOUS | Status: DC

## 2021-09-16 MED ORDER — MEPERIDINE HCL 25 MG/ML IJ SOLN: 6.2500 mg | INTRAMUSCULAR | Status: DC | PRN

## 2021-09-16 MED ORDER — LEVOTHYROXINE SODIUM 100 MCG PO TABS: 100.0000 ug | ORAL_TABLET | ORAL | Status: DC

## 2021-09-16 MED ORDER — MENTHOL 3 MG MT LOZG: 1.0000 | LOZENGE | OROMUCOSAL | Status: DC | PRN

## 2021-09-16 MED ORDER — 0.9 % SODIUM CHLORIDE (POUR BTL) OPTIME
TOPICAL | Status: DC | PRN
  Administered 2021-09-16: 1000 mL

## 2021-09-16 MED ORDER — ONDANSETRON HCL 4 MG PO TABS: 4.0000 mg | ORAL_TABLET | Freq: Four times a day (QID) | ORAL | Status: DC | PRN

## 2021-09-16 MED ORDER — ORAL CARE MOUTH RINSE: 15.0000 mL | Freq: Once | OROMUCOSAL | Status: AC

## 2021-09-16 MED ORDER — SURGIFLO WITH THROMBIN (HEMOSTATIC MATRIX KIT) OPTIME
TOPICAL | Status: DC | PRN
  Administered 2021-09-16: 1 via TOPICAL

## 2021-09-16 MED ORDER — FAMOTIDINE 20 MG PO TABS: 40.0000 mg | ORAL_TABLET | Freq: Every day | ORAL | Status: DC | PRN

## 2021-09-16 MED ORDER — LACTATED RINGERS IV SOLN: INTRAVENOUS | Status: DC | PRN

## 2021-09-16 MED ORDER — HYDROMORPHONE HCL 1 MG/ML IJ SOLN
0.2500 mg | INTRAMUSCULAR | Status: DC | PRN
  Administered 2021-09-16: 0.5 mg via INTRAVENOUS

## 2021-09-16 MED ORDER — ROSUVASTATIN CALCIUM 5 MG PO TABS
10.0000 mg | ORAL_TABLET | Freq: Every day | ORAL | Status: DC
  Administered 2021-09-16 – 2021-09-17 (×2): 10 mg via ORAL
  Filled 2021-09-16 (×2): qty 2

## 2021-09-16 MED ORDER — SUCCINYLCHOLINE CHLORIDE 200 MG/10ML IV SOSY
PREFILLED_SYRINGE | INTRAVENOUS | Status: DC | PRN
  Administered 2021-09-16: 160 mg via INTRAVENOUS

## 2021-09-16 MED ORDER — SUGAMMADEX SODIUM 200 MG/2ML IV SOLN
INTRAVENOUS | Status: DC | PRN
  Administered 2021-09-16 (×2): 100 mg via INTRAVENOUS

## 2021-09-16 MED ORDER — INSULIN ASPART 100 UNIT/ML IJ SOLN
0.0000 [IU] | Freq: Three times a day (TID) | INTRAMUSCULAR | Status: DC
  Administered 2021-09-16: 4 [IU] via SUBCUTANEOUS

## 2021-09-16 MED ORDER — CHOLESTYRAMINE 4 G PO PACK
4.0000 g | PACK | Freq: Two times a day (BID) | ORAL | Status: DC
  Administered 2021-09-16: 4 g via ORAL
  Filled 2021-09-16 (×3): qty 1

## 2021-09-16 MED ORDER — ONDANSETRON HCL 4 MG/2ML IJ SOLN
INTRAMUSCULAR | Status: DC | PRN
  Administered 2021-09-16: 4 mg via INTRAVENOUS

## 2021-09-16 MED ORDER — ROCURONIUM BROMIDE 100 MG/10ML IV SOLN
INTRAVENOUS | Status: DC | PRN
  Administered 2021-09-16: 10 mg via INTRAVENOUS
  Administered 2021-09-16: 20 mg via INTRAVENOUS

## 2021-09-16 MED ORDER — ONDANSETRON HCL 4 MG/2ML IJ SOLN: 4.0000 mg | Freq: Four times a day (QID) | INTRAMUSCULAR | Status: DC | PRN

## 2021-09-16 MED ORDER — DOCUSATE SODIUM 100 MG PO CAPS
100.0000 mg | ORAL_CAPSULE | Freq: Two times a day (BID) | ORAL | Status: DC
  Administered 2021-09-16 – 2021-09-17 (×3): 100 mg via ORAL
  Filled 2021-09-16 (×3): qty 1

## 2021-09-16 MED ORDER — ACETAMINOPHEN 325 MG PO TABS: 650.0000 mg | ORAL_TABLET | ORAL | Status: DC | PRN

## 2021-09-16 MED ORDER — VENLAFAXINE HCL ER 37.5 MG PO CP24
37.5000 mg | ORAL_CAPSULE | Freq: Every day | ORAL | Status: DC
  Administered 2021-09-17: 37.5 mg via ORAL
  Filled 2021-09-16: qty 1

## 2021-09-16 SURGICAL SUPPLY — 52 items
APPLICATOR CHLORAPREP 10.5 ORG (MISCELLANEOUS) ×2 IMPLANT
BAG COUNTER SPONGE SURGICOUNT (BAG) ×2 IMPLANT
BENZOIN TINCTURE PRP APPL 2/3 (GAUZE/BANDAGES/DRESSINGS) ×2 IMPLANT
BIT DRILL SM SPINE QC 12 (BIT) ×2 IMPLANT
BLADE CLIPPER SURG (BLADE) IMPLANT
BONE CC-ACS 11X14X6 6D (Bone Implant) ×2 IMPLANT
BUR ROUND FLUTED 4 SOFT TCH (BURR) ×2 IMPLANT
CHIPS BONE CANC-ACS11X14X6 6D (Bone Implant) ×1 IMPLANT
COLLAR CERV LO CONTOUR FIRM DE (SOFTGOODS) ×2 IMPLANT
COVER MAYO STAND STRL (DRAPES) ×4 IMPLANT
COVER SURGICAL LIGHT HANDLE (MISCELLANEOUS) ×2 IMPLANT
DRAPE C-ARM 42X72 X-RAY (DRAPES) ×2 IMPLANT
DRAPE HALF SHEET 40X57 (DRAPES) ×2 IMPLANT
DRAPE MICROSCOPE LEICA (MISCELLANEOUS) ×2 IMPLANT
DURAPREP 6ML APPLICATOR 50/CS (WOUND CARE) IMPLANT
ELECT COATED BLADE 2.86 ST (ELECTRODE) ×2 IMPLANT
ELECT REM PT RETURN 9FT ADLT (ELECTROSURGICAL) ×2
ELECTRODE REM PT RTRN 9FT ADLT (ELECTROSURGICAL) ×1 IMPLANT
EVACUATOR 1/8 PVC DRAIN (DRAIN) ×2 IMPLANT
GAUZE SPONGE 4X4 12PLY STRL (GAUZE/BANDAGES/DRESSINGS) ×2 IMPLANT
GLOVE SRG 8 PF TXTR STRL LF DI (GLOVE) ×2 IMPLANT
GLOVE SURG ORTHO LTX SZ7.5 (GLOVE) ×4 IMPLANT
GLOVE SURG UNDER POLY LF SZ8 (GLOVE) ×4
GOWN STRL REUS W/ TWL LRG LVL3 (GOWN DISPOSABLE) ×1 IMPLANT
GOWN STRL REUS W/ TWL XL LVL3 (GOWN DISPOSABLE) ×1 IMPLANT
GOWN STRL REUS W/TWL 2XL LVL3 (GOWN DISPOSABLE) ×2 IMPLANT
GOWN STRL REUS W/TWL LRG LVL3 (GOWN DISPOSABLE) ×2
GOWN STRL REUS W/TWL XL LVL3 (GOWN DISPOSABLE) ×2
HALTER HD/CHIN CERV TRACTION D (MISCELLANEOUS) ×2 IMPLANT
KIT BASIN OR (CUSTOM PROCEDURE TRAY) ×2 IMPLANT
KIT TURNOVER KIT B (KITS) ×2 IMPLANT
MANIFOLD NEPTUNE II (INSTRUMENTS) IMPLANT
NEEDLE 25GX 5/8IN NON SAFETY (NEEDLE) ×2 IMPLANT
NS IRRIG 1000ML POUR BTL (IV SOLUTION) ×2 IMPLANT
PACK ORTHO CERVICAL (CUSTOM PROCEDURE TRAY) ×2 IMPLANT
PAD ARMBOARD 7.5X6 YLW CONV (MISCELLANEOUS) ×4 IMPLANT
PATTIES SURGICAL .5 X.5 (GAUZE/BANDAGES/DRESSINGS) IMPLANT
PIN FIXATION TEMP W/SHOULDER (PIN) ×2 IMPLANT
PLATE ANT CERV XTEND 1 LV 14 (Plate) ×2 IMPLANT
POSITIONER HEAD DONUT 9IN (MISCELLANEOUS) ×2 IMPLANT
SCREW XTD VAR 4.2 SELF TAP 10 (Screw) ×2 IMPLANT
SCREW XTD VAR 4.2 SELF TAP 12 (Screw) ×4 IMPLANT
STRIP CLOSURE SKIN 1/2X4 (GAUZE/BANDAGES/DRESSINGS) ×2 IMPLANT
SURGIFLO W/THROMBIN 8M KIT (HEMOSTASIS) ×2 IMPLANT
SUT BONE WAX W31G (SUTURE) ×2 IMPLANT
SUT VIC AB 3-0 PS2 18 (SUTURE) ×4 IMPLANT
SUT VIC AB 3-0 PS2 18XBRD (SUTURE) ×1 IMPLANT
SUT VIC AB 4-0 PS2 27 (SUTURE) ×4 IMPLANT
SYR BULB EAR ULCER 3OZ GRN STR (SYRINGE) ×2 IMPLANT
TOWEL GREEN STERILE (TOWEL DISPOSABLE) ×2 IMPLANT
TOWEL GREEN STERILE FF (TOWEL DISPOSABLE) ×2 IMPLANT
WATER STERILE IRR 1000ML POUR (IV SOLUTION) ×2 IMPLANT

## 2021-09-16 NOTE — Discharge Instructions (Addendum)
Ok to shower 5 days postop.  Do not apply any creams or ointments to incision.  Do not remove steri-strips.  Can use 4x4 gauze and tape for dressing changes.  No aggressive activity. Cervical collar must be on at all times even when showering.  Do not bend or turn neck.     No driving.    Soft food diet.

## 2021-09-16 NOTE — Anesthesia Procedure Notes (Signed)
Procedure Name: Intubation Date/Time: 09/16/2021 7:52 AM Performed by: Yehuda Mao, CRNA Pre-anesthesia Checklist: Patient identified, Emergency Drugs available, Suction available and Patient being monitored Patient Re-evaluated:Patient Re-evaluated prior to induction Oxygen Delivery Method: Circle system utilized Preoxygenation: Pre-oxygenation with 100% oxygen Induction Type: IV induction Ventilation: Mask ventilation without difficulty Laryngoscope Size: Mac and 3 Grade View: Grade I Tube type: Oral Number of attempts: 1 Airway Equipment and Method: Stylet and Oral airway Placement Confirmation: ETT inserted through vocal cords under direct vision, positive ETCO2 and breath sounds checked- equal and bilateral Tube secured with: Tape Dental Injury: Teeth and Oropharynx as per pre-operative assessment  Comments: Neck as pt positioned it

## 2021-09-16 NOTE — Transfer of Care (Signed)
Immediate Anesthesia Transfer of Care Note  Patient: IVANNAH ZODY  Procedure(s) Performed: C5-6 ANTERIOR CERVICAL DISCECTOMY FUSION, ALLOGRAFT, PLATE  Patient Location: PACU  Anesthesia Type:General  Level of Consciousness: awake  Airway & Oxygen Therapy: Patient Spontanous Breathing and Patient connected to face mask  Post-op Assessment: Report given to RN and Post -op Vital signs reviewed and stable  Post vital signs: stable  Last Vitals:  Vitals Value Taken Time  BP 119/54 09/16/21 1006  Temp    Pulse 80 09/16/21 1008  Resp 18 09/16/21 1008  SpO2 99 % 09/16/21 1008  Vitals shown include unvalidated device data.  Last Pain:  Vitals:   09/16/21 0641  TempSrc:   PainSc: 0-No pain         Complications: No notable events documented.

## 2021-09-16 NOTE — Op Note (Signed)
Preop diagnosis: Cervical spondylosis C5-6 with spinal stenosis.  Postop diagnosis: Same  Procedure: C5-6 anterior cervical discectomy and fusion, allograft and plate.  Surgeon: Rodell Perna, MD  Assistant: Benjiman Core, PA-C medically necessary and present for the entire procedure  Anesthesia G OT plus Marcaine skin local 6 cc  EBL less than 100 cc  Implants:Implants  BONE CC-ACS 11X14X6 6D - [id number]  Inventory Item: BONE CC-ACS 11X14X6 6D Serial no.: [id number] Model/Cat no.: [id number]  Implant name: BONE CC-ACS 11X14X6 6D - [id number] Laterality: N/A Area: Neck  Manufacturer: Gerilyn Nestle FNDN Date of Manufacture:    Action: Implanted Number Used: 1   Device Identifier:  Device Identifier Type:     PLATE ANT CERV XTEND 1 LV 14 - [id number]  Inventory Item: PLATE ANT CERV XTEND 1 LV 14 Serial no.:  Model/Cat no.: [id number]  Implant name: PLATE ANT CERV XTEND 1 LV 14 - [id number] Laterality: N/A Area: Neck  Manufacturer: [place] Date of Manufacture:    Action: Implanted Number Used: 1   Device Identifier:  Device Identifier Type:     SCREW XTD VAR 4.2 SELF TAP 12 - [id number]  Inventory Item: SCREW XTD VAR 4.2 SELF TAP 12 Serial no.:  Model/Cat no.: [id number]  Implant name: SCREW XTD VAR 4.2 SELF TAP 12 - [id number] Laterality: N/A Area: Neck  Manufacturer: [employer] Date of Manufacture:    Action: Implanted Number Used: 2   Device Identifier:  Device Identifier Type:     SCREW XTD VAR 4.2 SELF TAP 10 - [id number]  Inventory Item: SCREW XTD VAR 4.2 SELF TAP 10 Serial no.:  Model/Cat no.: [id number]  Implant name: SCREW XTD VAR 4.2 SELF TAP 10 - [id number] Laterality: N/A Area: Neck  Manufacturer: [place] Date of Manufacture:    Action: Implanted Number Used: 1   Device Identifier:  Device Identifier Type:    Procedure: Open duction general anesthesia difficulty with IV placement in the left upper extremity she has had previous breast cancer  on the right side and eventually an IV was placed in the left foot.  She did have 1 IV in the right wrist.  Wrist restraints were applied had ultra traction applied without weight neck was prepped with ChloraPrep due to the patient's reported shellfish and iodine allergy.  Area squared with towels clear Steri-Drape was applied sterile Mayo stand the head thyroid sheets and drapes were applied.  Timeout procedure was completed Ancef was given prophylactically.  Standing prepping and draping had been performed after timeout incision was made starting at the midline extending to the left based on palpable landmarks.  Gelpi was placed medium platysma was divided in line with skin incision blunt dissection down the longus Coley was performed prominent spur initially needle was placed in the C4-5 due to the patient's thick short neck.  We moved 1 level down moved short 25 needle with straight clamp to the C5-6 level and then confirmed appropriate level with the second C arm image and promptly took a large chunk out of the disc with the scalpel and cleaning the disc base out with pituitary for marking a.  Teeth blades right and left heart replacement blade cephalad caudad.  Marked microscope draped brought in discectomy was performed progressing back posteriorly ligament where there was disc protrusion some spurring and 75% loss of disc space height with some posterior spurs that were mild.  Spurs removed with 1 to 2 mm Kerrisons.  Bulging disc was resected posterior longitudinal length was opened  up.  There was some epidural bleeding on the right side: Controlled with some Surgi-Flo.  Once the dura was completely decompressed trial sizers showed a 6 mm spacer gave a tight fit.  Some Surgi-Flo that it been used was suctioned out epidural space was dry had ultra traction was pulled by the CRNA graft countersunk 10 mm.  Plate was selected 14 mm plate and initially the 12 mm screws came all the way the posterior cortex we  put for millimeter screws in the lower level at C6 right and left.  1C arm AP and lateral showed good position tiny screwdriver was used to lock down the plate Hemovac placed in and out technique on the left in line with skin incision platysma closed with Vicryl 4-0 Vicryl subcuticular closure tincture benzoi needle count was correct.  Patient tolerated procedure well transferred recovery in stable condition.  N Steri-Strips postop dressing and soft collar with collar extender.  Instrument count

## 2021-09-16 NOTE — Progress Notes (Signed)
65 year old white female history of C5-6 HNP/gnosis comes in for preop evaluation.  States that symptoms unchanged from previous visit.  She is wanting to proceed with C5-6 ACDF is scheduled.  Today history and physical performed.  Review of systems negative.  Surgical procedure discussed along with potential rehab/recovery time.  All questions answered.

## 2021-09-16 NOTE — Interval H&P Note (Signed)
History and Physical Interval Note:  09/16/2021 7:27 AM  Desiree Anderson  has presented today for surgery, with the diagnosis of C5-6 herniated nucleus pulposus, stenosis.  The various methods of treatment have been discussed with the patient and family. After consideration of risks, benefits and other options for treatment, the patient has consented to  Procedure(s): C5-6 ANTERIOR CERVICAL DISCECTOMY FUSION, ALLOGRAFT, PLATE (N/A) as a surgical intervention.  [name] history has been reviewed, patient examined, no change in status, stable for surgery.  I have reviewed the patient's chart and labs.  Questions were answered to the patient's satisfaction.     [name]

## 2021-09-17 DIAGNOSIS — M4802 Spinal stenosis, cervical region: Secondary | ICD-10-CM | POA: Diagnosis not present

## 2021-09-17 DIAGNOSIS — Z87891 Personal history of nicotine dependence: Secondary | ICD-10-CM | POA: Diagnosis not present

## 2021-09-17 DIAGNOSIS — E039 Hypothyroidism, unspecified: Secondary | ICD-10-CM | POA: Diagnosis not present

## 2021-09-17 DIAGNOSIS — M4722 Other spondylosis with radiculopathy, cervical region: Secondary | ICD-10-CM | POA: Diagnosis not present

## 2021-09-17 DIAGNOSIS — Z853 Personal history of malignant neoplasm of breast: Secondary | ICD-10-CM | POA: Diagnosis not present

## 2021-09-17 DIAGNOSIS — Z79899 Other long term (current) drug therapy: Secondary | ICD-10-CM | POA: Diagnosis not present

## 2021-09-17 DIAGNOSIS — E119 Type 2 diabetes mellitus without complications: Secondary | ICD-10-CM | POA: Diagnosis not present

## 2021-09-17 DIAGNOSIS — I10 Essential (primary) hypertension: Secondary | ICD-10-CM | POA: Diagnosis not present

## 2021-09-17 LAB — GLUCOSE, CAPILLARY: Glucose-Capillary: 118 mg/dL — ABNORMAL HIGH (ref 70–99)

## 2021-09-17 MED ORDER — OXYCODONE-ACETAMINOPHEN 5-325 MG PO TABS: 1.0000 | ORAL_TABLET | Freq: Four times a day (QID) | ORAL | 0 refills | Status: DC | PRN

## 2021-09-17 NOTE — Discharge Summary (Signed)
Patient ID: Desiree Anderson MRN: [id number] DOB/AGE: 03-Jan-1956 77 y.o.  Admit date: 09/16/2021 Discharge date: 09/17/2021  Admission Diagnoses:  Active Problems:   Other spondylosis with radiculopathy, cervical region   Spinal stenosis of cervical region   Cervical spinal stenosis   Discharge Diagnoses:  Same  Past Medical History:  Diagnosis Date   Anxiety    Breast cancer ([place])    Complication of anesthesia    hypotension   DDD (degenerative disc disease), lumbar    Diabetes mellitus without complication ([place])    Erosion of vaginal mesh ([facility])    Fibromyalgia    GERD (gastroesophageal reflux disease)    History of gastric ulcer    History of radiation therapy 07/06/16- 08/22/16   Right Breast   Hypertension    Hypothyroidism    Personal history of radiation therapy    Rectocele     Surgeries: Procedure(s): C5-6 ANTERIOR CERVICAL DISCECTOMY FUSION, ALLOGRAFT, PLATE on 27/51/7001   Consultants:   Discharged Condition: Improved  Hospital Course: Desiree Anderson is an 65 y.o. female who was admitted 09/16/2021 for operative treatment of<principal problem not specified>. Patient has severe unremitting pain that affects sleep, daily activities, and work/hobbies. After pre-op clearance the patient was taken to the operating room on 09/16/2021 and underwent  Procedure(s): C5-6 ANTERIOR CERVICAL DISCECTOMY FUSION, ALLOGRAFT, PLATE.    Patient was given perioperative antibiotics:  Anti-infectives (From admission, onward)    Start     Dose/Rate Route Frequency Ordered Stop   09/16/21 0600  ceFAZolin (ANCEF) IVPB 2g/100 mL premix        2 g 200 mL/hr over 30 Minutes Intravenous On call to O.R. 09/16/21 [id number] 09/16/21 0749        Patient was given sequential compression devices, early ambulation, and chemoprophylaxis to prevent DVT.  Patient benefited maximally from hospital stay and there were no complications.    Recent vital signs: Patient Vitals for the past 24  hrs:  BP Temp Temp src Pulse Resp SpO2  09/17/21 0427 121/67 97.8 F (36.6 C) Oral 90 20 98 %  09/17/21 0001 111/66 98.2 F (36.8 C) Oral (!) 102 18 95 %  09/16/21 2113 -- -- -- (!) 109 -- 95 %  09/16/21 1938 107/65 98 F (36.7 C) Oral (!) 115 18 96 %  09/16/21 1145 (!) 149/77 (!) 97.5 F (36.4 C) Oral 87 20 97 %  09/16/21 1108 130/74 97.9 F (36.6 C) -- 81 16 94 %  09/16/21 1053 125/66 -- -- 85 14 95 %  09/16/21 1035 131/70 -- -- 82 13 97 %     Recent laboratory studies: No results for input(s): WBC, HGB, HCT, PLT, NA, K, CL, CO2, BUN, CREATININE, GLUCOSE, INR, CALCIUM in the last 72 hours.  Invalid input(s): PT, 2   Discharge Medications:   Allergies as of 09/17/2021       Reactions   Lipitor [atorvastatin] Other (See Comments)   Muscle and legs aches   Shellfish Allergy Diarrhea, Nausea And Vomiting   Betadine [povidone Iodine] Rash   Iodine    Pt unsure of reaction         Medication List     STOP taking these medications    acetaminophen 325 MG tablet Commonly known as: TYLENOL   acetaminophen 500 MG tablet Commonly known as: TYLENOL   baclofen 10 MG tablet Commonly known as: LIORESAL   phentermine 37.5 MG tablet Commonly known as: ADIPEX-P   triamcinolone cream 0.1 % Commonly known as:  KENALOG       TAKE these medications    ALPRAZolam 0.5 MG tablet Commonly known as: XANAX TAKE 1/2-1 TABLET BY MOUTH 2-3 TIMES DAILY ONLY IF NEEDED FOR ANXIETY ATTACK AND LIMIT TO 5 DAYS A WEEK TO AVOID ADDICTION AND DEMENTIA   Blood Glucose Monitoring Suppl w/Device Kit Test blood sugar once daily or as directed.   cetirizine 10 MG tablet Commonly known as: ZYRTEC Take 10 mg by mouth daily as needed for allergies.   cholestyramine 4 g packet Commonly known as: QUESTRAN Take 1 packet (4 g total) by mouth 2 (two) times daily.   ezetimibe 10 MG tablet Commonly known as: ZETIA TAKE 1 TABLET BY MOUTH ONCE DAILY FOR CHOLESTEROL   famotidine 40 MG  tablet Commonly known as: PEPCID Take 40 mg by mouth daily as needed for heartburn or indigestion.   fluticasone 50 MCG/ACT nasal spray Commonly known as: FLONASE USE TWO SPRAY(S) IN EACH NOSTRIL ONCE DAILY AS NEEDED   FREESTYLE LITE test strip Generic drug: glucose blood Test blood sugar once daily or as directed.   gabapentin 600 MG tablet Commonly known as: NEURONTIN TAKE 1/2 TO 1 (ONE-HALF TO ONE) TABLET BY MOUTH 2 TO 3 TIMES DAILY AS  NEEDED  TO  CONTROL  PAIN   hyoscyamine 0.125 MG SL tablet Commonly known as: LEVSIN SL DISSOLVE 1 TABLET IN MOUTH EVERY 6 HOURS AS NEEDED   Lancets Misc Test blood sugar once daily or as directed.   levothyroxine 100 MCG tablet Commonly known as: SYNTHROID Take  1 tablet  Daily  on an empty stomach with only water  for 30 minutes & no Antacid meds, Calcium or Magnesium for 4 hours & avoid Biotin What changed:  how much to take how to take this when to take this additional instructions   meclizine 25 MG tablet Commonly known as: ANTIVERT 1/2-1 pill up to 3 times daily for motion sickness/dizziness What changed:  how much to take how to take this when to take this reasons to take this additional instructions   metFORMIN 500 MG tablet Commonly known as: Glucophage 2 tabs twice a day with meals   methocarbamol 500 MG tablet Commonly known as: Robaxin Take 1 tablet (500 mg total) by mouth every 6 (six) hours as needed for muscle spasms.   olmesartan 40 MG tablet Commonly known as: BENICAR Take  1 /2 to 1 tablet  Daily  for BP What changed:  how much to take how to take this when to take this additional instructions   oxyCODONE-acetaminophen 5-325 MG tablet Commonly known as: PERCOCET/ROXICET Take 1 tablet by mouth every 6 (six) hours as needed for severe pain.   potassium chloride SA 20 MEQ tablet Commonly known as: KLOR-CON TAKE 1 TABLET BY MOUTH TWICE DAILY FOR  POTASSIUM   promethazine-dextromethorphan 6.25-15  MG/5ML syrup Commonly known as: PROMETHAZINE-DM Take 5 mLs by mouth 4 (four) times daily as needed for cough.   rizatriptan 10 MG tablet Commonly known as: MAXALT Take 10 mg by mouth as needed for migraine. May repeat in 2 hours if needed   rosuvastatin 10 MG tablet Commonly known as: CRESTOR Take  1 tablet  Daily for Cholesterol   /TAKE 1 TABLET BY MOUTH ONCE DAILY FOR CHOLESTEROL   topiramate 50 MG tablet Commonly known as: Topamax Take  1/2 to 1 tablet  2 x /day  at Suppertime & Bedtime for Dieting & Weight Loss What changed:  how much to take how to  take this when to take this additional instructions   venlafaxine XR 37.5 MG 24 hr capsule Commonly known as: EFFEXOR-XR Take 1 capsule (37.5 mg total) by mouth daily with breakfast.   vitamin C 1000 MG tablet Take 1,000 mg by mouth daily.   Vitamin D-3 125 MCG (5000 UT) Tabs Take 5,000 Units by mouth 2 (two) times daily.   zinc gluconate 50 MG tablet Take 50 mg by mouth daily.        Diagnostic Studies: DG Cervical Spine 2 or 3 views  Result Date: 09/16/2021 CLINICAL DATA:  C5-6 anterior fusion. EXAM: CERVICAL SPINE - 2-3 VIEW; DG C-ARM 1-60 MIN-NO REPORT Radiation exposure index: 3.64 mGy. COMPARISON:  Mar 29, 2021. FINDINGS: Three intraoperative fluoroscopic images were obtained of the cervical spine. These images demonstrate the patient be status post surgical anterior fusion of C5-6. IMPRESSION: Fluoroscopic guidance provided during surgical anterior fusion of C5-6. Electronically Signed   By: Marijo Conception M.D.   On: 09/16/2021 13:21   DG C-Arm 1-60 Min-No Report  Result Date: 09/16/2021 CLINICAL DATA:  C5-6 anterior fusion. EXAM: CERVICAL SPINE - 2-3 VIEW; DG C-ARM 1-60 MIN-NO REPORT Radiation exposure index: 3.64 mGy. COMPARISON:  Mar 29, 2021. FINDINGS: Three intraoperative fluoroscopic images were obtained of the cervical spine. These images demonstrate the patient be status post surgical anterior fusion of  C5-6. IMPRESSION: Fluoroscopic guidance provided during surgical anterior fusion of C5-6. Electronically Signed   By: Marijo Conception M.D.   On: 09/16/2021 13:21   US BREAST LTD UNI RIGHT INC AXILLA  Result Date: 08/25/2021 CLINICAL DATA:  Screening recall for possible right breast mass. History of right breast cancer post lumpectomy 2017. EXAM: DIGITAL DIAGNOSTIC UNILATERAL RIGHT MAMMOGRAM WITH TOMOSYNTHESIS AND CAD; ULTRASOUND RIGHT BREAST LIMITED TECHNIQUE: Right digital diagnostic mammography and breast tomosynthesis was performed. The images were evaluated with computer-aided detection.; Targeted ultrasound examination of the right breast was performed COMPARISON:  Previous exams. ACR Breast Density Category b: There are scattered areas of fibroglandular density. FINDINGS: Additional tomograms were performed of the right breast. There is a low-density spiculated mass in the upper-outer posterior right breast measuring 0.5 cm. Targeted ultrasound of the upper-outer right breast was performed. Dystrophic calcifications are seen sonographically at the surgical site at 11 o'clock 1 cm from nipple, however no sonographic correlate for the small spiculated mass seen in the upper-outer posterior right breast. No lymphadenopathy seen in the right axilla. IMPRESSION: Suspicious 0.5 cm mass in the upper-outer right breast visualized on mammography only. RECOMMENDATION: Recommend stereotactic guided biopsy of the 0.5 cm mass in the upper-outer right breast. I have discussed the findings and recommendations with the patient. If applicable, a reminder letter will be sent to the patient regarding the next appointment. BI-RADS CATEGORY  4: Suspicious. Electronically Signed   By: Everlean Alstrom M.D.   On: 08/25/2021 16:31  MM DIAG BREAST TOMO UNI RIGHT  Result Date: 08/25/2021 CLINICAL DATA:  Screening recall for possible right breast mass. History of right breast cancer post lumpectomy 2017. EXAM: DIGITAL DIAGNOSTIC  UNILATERAL RIGHT MAMMOGRAM WITH TOMOSYNTHESIS AND CAD; ULTRASOUND RIGHT BREAST LIMITED TECHNIQUE: Right digital diagnostic mammography and breast tomosynthesis was performed. The images were evaluated with computer-aided detection.; Targeted ultrasound examination of the right breast was performed COMPARISON:  Previous exams. ACR Breast Density Category b: There are scattered areas of fibroglandular density. FINDINGS: Additional tomograms were performed of the right breast. There is a low-density spiculated mass in the upper-outer posterior right breast measuring  0.5 cm. Targeted ultrasound of the upper-outer right breast was performed. Dystrophic calcifications are seen sonographically at the surgical site at 11 o'clock 1 cm from nipple, however no sonographic correlate for the small spiculated mass seen in the upper-outer posterior right breast. No lymphadenopathy seen in the right axilla. IMPRESSION: Suspicious 0.5 cm mass in the upper-outer right breast visualized on mammography only. RECOMMENDATION: Recommend stereotactic guided biopsy of the 0.5 cm mass in the upper-outer right breast. I have discussed the findings and recommendations with the patient. If applicable, a reminder letter will be sent to the patient regarding the next appointment. BI-RADS CATEGORY  4: Suspicious. Electronically Signed   By: Everlean Alstrom M.D.   On: 08/25/2021 16:31  MM CLIP PLACEMENT RIGHT  Result Date: 08/26/2021 CLINICAL DATA:  Evaluate post biopsy marker clip placement following stereotactic core needle biopsy of a small right breast mass. EXAM: 3D DIAGNOSTIC RIGHT MAMMOGRAM POST STEREOTACTIC BIOPSY COMPARISON:  Previous exam(s). FINDINGS: 3D Mammographic images were obtained following stereotactic guided biopsy of a small right breast mass. The biopsy marking clip is in expected position at the site of biopsy. IMPRESSION: Appropriate positioning of the X shaped biopsy marking clip at the site of biopsy in the in the  upper outer quadrant of the right breast in the expected location of the small mass, which is no longer appreciated. Final Assessment: Post Procedure Mammograms for Marker Placement Electronically Signed   By: Lajean Manes M.D.   On: 08/26/2021 11:09  MM RT BREAST BX W LOC DEV 1ST LESION IMAGE BX SPEC STEREO GUIDE  Addendum Date: 08/29/2021   ADDENDUM REPORT: 08/29/2021 11:59 ADDENDUM: Pathology revealed STROMAL FIBROSIS AND FAT NECROSIS WITH PIGMENTED HISTIOCYTES CONSISTENT WITH PREVIOUS SURGICAL SITE CHANGES of the RIGHT breast, upper outer quadrant, (x clip). This was found to be concordant by Dr. Lajean Manes. Pathology results were discussed with the patient by telephone. The patient reported doing well after the biopsy with tenderness at the site. Post biopsy instructions and care were reviewed and questions were answered. The patient was encouraged to call [facility] for any additional concerns. The patient was instructed to return for annual screening mammography in September 2023. Pathology results reported by Terie Purser, RN on 08/29/2021. Electronically Signed   By: Lajean Manes M.D.   On: 08/29/2021 11:59   Result Date: 08/29/2021 CLINICAL DATA:  Patient presents for stereotactic core needle biopsy of a small right breast mass or focal asymmetry. She has a history of a right lumpectomy for breast carcinoma in 2017. EXAM: RIGHT BREAST STEREOTACTIC CORE NEEDLE BIOPSY COMPARISON:  Previous exams. FINDINGS: The patient and I discussed the procedure of stereotactic-guided biopsy including benefits and alternatives. We discussed the high likelihood of a successful procedure. We discussed the risks of the procedure including infection, bleeding, tissue injury, clip migration, and inadequate sampling. Informed written consent was given. The usual time out protocol was performed immediately prior to the procedure. Using sterile technique and 1% Lidocaine as local anesthetic,  under stereotactic guidance, a 9 gauge vacuum assisted device was used to perform core needle biopsy of the small mass versus focal asymmetry in the upper outer right breast using a superior approach. Specimen radiograph was performed showing a few calcifications. Specimens with calcifications are identified for pathology. Lesion quadrant: Upper outer quadrant At the conclusion of the procedure, an X shaped tissue marker clip was deployed into the biopsy cavity. Follow-up 2-view mammogram was performed and dictated separately. IMPRESSION: Stereotactic-guided biopsy of  a small right breast mass versus focal asymmetry. No apparent complications. Electronically Signed: By: Lajean Manes M.D. On: 08/26/2021 11:03   Disposition: Discharge disposition: 01-Home or Self Care       Discharge Instructions     Incentive spirometry RT   Complete by: As directed         Follow-up Information     [name], MD. Schedule an appointment as soon as possible for a visit today.   Specialty: Orthopedic Surgery Why: need return office visit one week postop.  call to schedule appointment. Contact information: [address] [place] [address] [phone]                  Signed: Mcarthur Rossetti 09/17/2021, 10:33 AM

## 2021-09-17 NOTE — Progress Notes (Signed)
Patient was transported to her vehicle via wheelchair by NT for discharge home; in no acute distress nor complaints of pain nor discomfort; incision on her neck with gauze was clean, dry and intact with a soft collar on; room was checked and accounted for all her belongings; discharge instructions concerning her medications, wound care, follow up appointment and when to call the doctor were all discussed with the patient and her husband by RN and both expressed understanding on the instructions given.

## 2021-09-17 NOTE — Plan of Care (Signed)
  Problem: Education: Goal: Ability to verbalize activity precautions or restrictions will improve Outcome: Completed/Met Goal: Knowledge of the prescribed therapeutic regimen will improve Outcome: Completed/Met Goal: Understanding of discharge needs will improve Outcome: Completed/Met   Problem: Bowel/Gastric: Goal: Gastrointestinal status for postoperative course will improve Outcome: Completed/Met   Problem: Clinical Measurements: Goal: Ability to maintain clinical measurements within normal limits will improve Outcome: Completed/Met Goal: Postoperative complications will be avoided or minimized Outcome: Completed/Met Goal: Diagnostic test results will improve Outcome: Completed/Met   Problem: Skin Integrity: Goal: Will show signs of wound healing Outcome: Completed/Met   Problem: Health Behavior/Discharge Planning: Goal: Identification of resources available to assist in meeting health care needs will improve Outcome: Completed/Met

## 2021-09-17 NOTE — Evaluation (Signed)
Occupational Therapy Evaluation Patient Details Name: Desiree Anderson MRN: [id number] DOB: 1956/03/11 Today's Date: 09/17/2021   History of Present Illness Pt is 65 y/o F admitted 09/16/21 with cervical spinal stenosis. Pt is s/p C5-6 anterior cervical discectomy and fusion on 09/16/21. PMH includes DDD, fibromyalgia, HTN, hypothryoidism, GERD, history of breast cancer.   Clinical Impression   Pt presents with decreased balance and pain. Pt currently independent with ADLs and requiring supervision for safety with functional transfers/mobility. Pt lives with spouse who will be available to provide 24 hour assistance as needed at home. Pt educated on cervical precautions and compensatory strategies for ADLs. Pt should be safe to return home without any further skilled OT services once medically cleared. No further skilled OT needs at this time. Will sign off.      Recommendations for follow up therapy are one component of a multi-disciplinary discharge planning process, led by the attending physician.  Recommendations may be updated based on patient status, additional functional criteria and insurance authorization.   Follow Up Recommendations  No OT follow up    Equipment Recommendations  None recommended by OT    Recommendations for Other Services       Precautions / Restrictions Precautions Precautions: Cervical Precaution Booklet Issued: Yes (comment) Required Braces or Orthoses: Cervical Brace Cervical Brace: Soft collar;At all times Restrictions Weight Bearing Restrictions: No      Mobility Bed Mobility Overal bed mobility: Independent                  Transfers Overall transfer level: Needs assistance Equipment used: None Transfers: Sit to/from Stand Sit to Stand: Supervision              Balance Overall balance assessment: Mild deficits observed, not formally tested                                         ADL either performed or  assessed with clinical judgement   ADL Overall ADL's : Needs assistance/impaired Eating/Feeding: Independent   Grooming: Independent   Upper Body Bathing: Independent   Lower Body Bathing: Independent   Upper Body Dressing : Independent   Lower Body Dressing: Independent   Toilet Transfer: Supervision/safety   Toileting- Clothing Manipulation and Hygiene: Independent   Tub/ Shower Transfer: Supervision/safety   Functional mobility during ADLs: Supervision/safety General ADL Comments: Pt is limited by pain     Vision   Vision Assessment?: No apparent visual deficits     Perception     Praxis      Pertinent Vitals/Pain Pain Assessment: Faces Faces Pain Scale: Hurts little more Pain Location: neck Pain Descriptors / Indicators: Aching;Sore Pain Intervention(s): Monitored during session;Repositioned     Hand Dominance     Extremity/Trunk Assessment Upper Extremity Assessment Upper Extremity Assessment: Overall WFL for tasks assessed   Lower Extremity Assessment Lower Extremity Assessment: Overall WFL for tasks assessed   Cervical / Trunk Assessment Cervical / Trunk Assessment: Other exceptions Cervical / Trunk Exceptions: s/p cervical surgery   Communication Communication Communication: No difficulties   Cognition Arousal/Alertness: Awake/alert Behavior During Therapy: WFL for tasks assessed/performed Overall Cognitive Status: Within Functional Limits for tasks assessed                                     General Comments  Exercises     Shoulder Instructions      Home Living Family/patient expects to be discharged to:: Private residence Living Arrangements: Spouse/significant other Available Help at Discharge: Family;Available 24 hours/day Type of Home: House Home Access: Stairs to enter [employer] of Steps: 4 Entrance Stairs-Rails: Right;Left;Can reach both Home Layout: Two level;Able to live on main level with  bedroom/bathroom     Bathroom Shower/Tub: Producer, television/film/video: Standard     Home Equipment: Environmental consultant - 4 wheels;Cane - single point          Prior Functioning/Environment Level of Independence: Independent                 OT Problem List: Decreased activity tolerance;Impaired balance (sitting and/or standing);Pain;Decreased knowledge of precautions      OT Treatment/Interventions:      OT Goals(Current goals can be found in the care plan section) Acute Rehab OT Goals Patient Stated Goal: return home OT Goal Formulation: With patient  OT Frequency:     Barriers to D/C:            Co-evaluation              AM-PAC OT "6 Clicks" Daily Activity     Outcome Measure Help from another person eating meals?: None Help from another person taking care of personal grooming?: None Help from another person toileting, which includes using toliet, bedpan, or urinal?: A Little Help from another person bathing (including washing, rinsing, drying)?: A Little Help from another person to put on and taking off regular upper body clothing?: None Help from another person to put on and taking off regular lower body clothing?: None 6 Click Score: 22   End of Session Nurse Communication: Mobility status  Activity Tolerance: Patient tolerated treatment well Patient left: in bed;with call bell/phone within reach  OT Visit Diagnosis: Unsteadiness on feet (R26.81);Other abnormalities of gait and mobility (R26.89);Pain                Time: 8119-1478 OT Time Calculation (min): 24 min Charges:  OT General Charges $OT Visit: 1 Visit OT Evaluation $OT Eval Low Complexity: 1 Low  Honesti Seaberg C, OT/L  Acute Rehab [phone]  Lenice Llamas 09/17/2021, 8:45 AM

## 2021-09-17 NOTE — Progress Notes (Signed)
Subjective: 1 Day Post-Op Procedure(s) (LRB): C5-6 ANTERIOR CERVICAL DISCECTOMY FUSION, ALLOGRAFT, PLATE (N/A) Patient reports pain as mild.  Denies issues with swallowing.  PT at the bedside now.  Objective: Vital signs in last 24 hours: Temp:  [97.5 F (36.4 C)-98.2 F (36.8 C)] 97.8 F (36.6 C) (10/22 0427) Pulse Rate:  [80-115] 90 (10/22 0427) Resp:  [13-20] 20 (10/22 0427) BP: (107-149)/(54-77) 121/67 (10/22 0427) SpO2:  [91 %-100 %] 98 % (10/22 0427)  Intake/Output from previous day: 10/21 0701 - 10/22 0700 In: 1500 [I.V.:1500] Out: 0  Intake/Output this shift: No intake/output data recorded.  No results for input(s): HGB in the last 72 hours. No results for input(s): WBC, RBC, HCT, PLT in the last 72 hours. No results for input(s): NA, K, CL, CO2, BUN, CREATININE, GLUCOSE, CALCIUM in the last 72 hours. No results for input(s): LABPT, INR in the last 72 hours.  Moves her arms and legs/feet easily with no gross deficits   Assessment/Plan: 1 Day Post-Op Procedure(s) (LRB): C5-6 ANTERIOR CERVICAL DISCECTOMY FUSION, ALLOGRAFT, PLATE (N/A) Up with therapy Can discharge to home today.    Patient's anticipated LOS is less than 2 midnights, meeting these requirements: - Younger than 22 - Lives within 1 hour of care - Has a competent adult at home to recover with post-op recover - NO history of  - Chronic pain requiring opiods  - Diabetes  - Coronary Artery Disease  - Heart failure  - Heart attack  - Stroke  - DVT/VTE  - Cardiac arrhythmia  - Respiratory Failure/COPD  - Renal failure  - Anemia  - Advanced Liver disease     Mcarthur Rossetti 09/17/2021, 8:18 AM

## 2021-09-17 NOTE — Progress Notes (Signed)
Orthopedic Tech Progress Note Patient Details:  Desiree Anderson 1956-02-05 [id number]  Ortho Devices Type of Ortho Device: Soft collar Ortho Device/Splint Interventions: Ordered   Post Interventions Instructions Provided: Care of device, Adjustment of device Dropped off with patient. Vernona Rieger 09/17/2021, 9:18 AM

## 2021-09-19 ENCOUNTER — Encounter (HOSPITAL_COMMUNITY): Payer: Self-pay | Admitting: Orthopaedic Surgery

## 2021-09-20 NOTE — Anesthesia Postprocedure Evaluation (Signed)
Anesthesia Post Note  Patient: Desiree Anderson  Procedure(s) Performed: C5-6 ANTERIOR CERVICAL DISCECTOMY FUSION, ALLOGRAFT, PLATE     Patient location during evaluation: PACU Anesthesia Type: General Level of consciousness: awake Pain management: pain level controlled Vital Signs Assessment: post-procedure vital signs reviewed and stable Respiratory status: spontaneous breathing Cardiovascular status: stable Postop Assessment: no apparent nausea or vomiting Anesthetic complications: no   No notable events documented.  Last Vitals:  Vitals:   09/17/21 0001 09/17/21 0427  BP: 111/66 121/67  Pulse: (!) 102 90  Resp: 18 20  Temp: 36.8 C 36.6 C  SpO2: 95% 98%    Last Pain:  Vitals:   09/17/21 0704  TempSrc:   PainSc: [place] Jr

## 2021-09-23 ENCOUNTER — Encounter: Payer: Medicare Other | Admitting: Orthopaedic Surgery

## 2021-09-24 ENCOUNTER — Other Ambulatory Visit: Payer: Self-pay | Admitting: Internal Medicine

## 2021-09-24 MED ORDER — POTASSIUM CHLORIDE ER 10 MEQ PO CPCR: ORAL_CAPSULE | ORAL | 3 refills | Status: DC

## 2021-09-27 ENCOUNTER — Ambulatory Visit: Payer: Self-pay

## 2021-09-27 ENCOUNTER — Ambulatory Visit (INDEPENDENT_AMBULATORY_CARE_PROVIDER_SITE_OTHER): Payer: Medicare Other | Admitting: Orthopaedic Surgery

## 2021-09-27 ENCOUNTER — Other Ambulatory Visit: Payer: Self-pay

## 2021-09-27 ENCOUNTER — Encounter: Payer: Self-pay | Admitting: Orthopaedic Surgery

## 2021-09-27 VITALS — BP 133/82 | Ht 62.0 in | Wt 183.0 lb

## 2021-09-27 DIAGNOSIS — Z981 Arthrodesis status: Secondary | ICD-10-CM

## 2021-09-27 NOTE — Progress Notes (Signed)
Post-Op Visit Note   Patient: Desiree Anderson           Date of Birth: 05-31-1956           MRN: [id number] Visit Date: 09/27/2021 PCP: Unk Pinto, MD   Assessment & Plan: Postop cervical fusion patient's been using a walker that she had in the family since she states her balance has not been as good.  She stopped taking her pain medication.  She can walk across the off this exam room without wide-based gait no staggering.  No hyperreflexia knee and ankle jerk.  Lower extremity strength is strong.  Patient states she or her hands feel great no weakness in upper extremities good relief of preop numbness and tingling.  Chief Complaint:  Chief Complaint  Patient presents with   Neck - Routine Post Op    09/16/2021 C5-6 ACDF   Visit Diagnoses:  1. Status post cervical spinal fusion     Plan: Return 5 weeks for lateral flexion-extension x-ray.  She works away from the walker to a cane if she likes to help with ambulation.  X-rays on return flexion-extension lateral.  Follow-Up Instructions: No follow-ups on file.   Orders:  Orders Placed This Encounter  Procedures   XR Cervical Spine 2 or 3 views   No orders of the defined types were placed in this encounter.   Imaging: No results found.  PMFS History: Patient Active Problem List   Diagnosis Date Noted   Cervical spinal stenosis 09/16/2021   Spinal stenosis of cervical region 07/04/2021   Other spondylosis with radiculopathy, cervical region 03/29/2021   Insomnia 03/25/2018   Situational anxiety 03/25/2018   Encounter for Medicare annual wellness exam 02/19/2017   Tobacco chew use 05/15/2016   Malignant neoplasm of upper-outer quadrant of right breast in female, estrogen receptor positive ([facility]) 04/21/2016   Hypothyroidism 04/18/2016   Obesity (BMI 30.0-34.9) 10/11/2015   Medication management 09/24/2014   Cystocele, midline 03/23/2014   Essential hypertension 12/10/2013   Mixed hyperlipidemia 12/10/2013    Prediabetes 12/10/2013   Vitamin D deficiency 12/10/2013   DJD  12/10/2013   DDD, lumbar 12/10/2013   Fibromyalgia Syndrome 12/10/2013   Past Medical History:  Diagnosis Date   Anxiety    Breast cancer ([place])    Complication of anesthesia    hypotension   DDD (degenerative disc disease), lumbar    Diabetes mellitus without complication ([place])    Erosion of vaginal mesh ([facility])    Fibromyalgia    GERD (gastroesophageal reflux disease)    History of gastric ulcer    History of radiation therapy 07/06/16- 08/22/16   Right Breast   Hypertension    Hypothyroidism    Personal history of radiation therapy    Rectocele     Family History  Problem Relation Age of Onset   Diabetes Mother    Diverticulitis Mother    Lung cancer Brother    Intestinal polyp Sister    Diverticulitis Sister     Past Surgical History:  Procedure Laterality Date   ANTERIOR CERVICAL DECOMP/DISCECTOMY FUSION N/A 09/16/2021   Procedure: C5-6 ANTERIOR CERVICAL DISCECTOMY FUSION, ALLOGRAFT, PLATE;  Surgeon: [name], MD;  Location: [facility];  Service: Orthopedics;  Laterality: N/A;   BLADDER TACK  2003   BREAST BIOPSY Right 04/18/2016   BREAST LUMPECTOMY Right 05/18/2016   Breast mass removal Left 1985   Benign   CARDIOVASCULAR STRESS TEST  07-02-2013  DR DALTON MCLEAN   LOW RISK  NUCLEAR STUDY WITH A SMALL, MILD APICAL SEPTAL FIXED PERFUSION DEFECT .  GIVEN NORMAL WALL FUNCTION , SUSPECT REPRESENTS ATTENUATION/  EF 74%/  NO ISCHEMIA   CATARACT EXTRACTION W/ INTRAOCULAR LENS IMPLANT Left 01/2014   CATARACT EXTRACTION W/ INTRAOCULAR LENS IMPLANT Right 12/2017   Dr. Manuella Ghazi   CHOLECYSTECTOMY N/A 12/10/2017   Procedure: LAPAROSCOPIC CHOLECYSTECTOMY;  Surgeon: Rolm Bookbinder, MD;  Location: [facility];  Service: General;  Laterality: N/A;   CYSTO/  HYDRODISTENTION/  BLADDER BX/   INSTILLATION THERAPY  08-29-2010   EXCISION OF MESH N/A 03/23/2014   Procedure: EXCISION OF VAGINAL AND PERI-URETHRAL MESH, EXTRACTION FROM  URETHRA TO APEX, insertion of XENFORM in the vaginal vault;  Surgeon: Ailene Rud, MD;  Location: [facility];  Service: Urology;  Laterality: N/A;   EXCISION VAGINAL CYST N/A 04/06/2014   Procedure: VAGINAL SPECULUM EXAMINATION/POSSIBLE DRAINAGE OF HEMATOMA;  Surgeon: Ailene Rud, MD;  Location: [facility];  Service: Urology;  Laterality: N/A;   INCONTINENCE SURGERY  2009  &  2005   RADIOACTIVE SEED GUIDED PARTIAL MASTECTOMY WITH AXILLARY SENTINEL LYMPH NODE BIOPSY Right 05/18/2016   Procedure: RADIOACTIVE SEED GUIDED RIGHT BREAST LUMPECTOMY WITH AXILLARY SENTINEL LYMPH NODE BIOPSY;  Surgeon: Rolm Bookbinder, MD;  Location: [facility];  Service: General;  Laterality: Right;  RADIOACTIVE SEED GUIDED RIGHT BREAST LUMPECTOMY WITH AXILLARY SENTINEL LYMPH NODE BIOPSY   TRANSTHORACIC ECHOCARDIOGRAM  06-16-2013   GRADE I DIASTOLIC DYSFUNCTION/  EF 55-60%/  MILD AR/  TRIVIAL MR  &  TR   VAGINAL HYSTERECTOMY  2000   w/  unilateral salpingoophorectomy   VEIN BYPASS SURGERY  AGE 50   RIGHT LEG   Social History   Occupational History   Not on file  Tobacco Use   Smoking status: Former    Years: 20.00    Types: Cigarettes    Quit date: 11/28/1995    Years since quitting: 25.8   Smokeless tobacco: Current    Types: Snuff   Tobacco comments:    dips daily  Vaping Use   Vaping Use: Never used  Substance and Sexual Activity   Alcohol use: No   Drug use: No   Sexual activity: Not on file

## 2021-10-21 ENCOUNTER — Other Ambulatory Visit: Payer: Self-pay | Admitting: Internal Medicine

## 2021-10-21 DIAGNOSIS — E669 Obesity, unspecified: Secondary | ICD-10-CM

## 2021-11-01 ENCOUNTER — Other Ambulatory Visit: Payer: Self-pay

## 2021-11-01 ENCOUNTER — Ambulatory Visit (INDEPENDENT_AMBULATORY_CARE_PROVIDER_SITE_OTHER): Payer: Medicare Other

## 2021-11-01 ENCOUNTER — Encounter: Payer: Self-pay | Admitting: Orthopaedic Surgery

## 2021-11-01 ENCOUNTER — Ambulatory Visit (INDEPENDENT_AMBULATORY_CARE_PROVIDER_SITE_OTHER): Payer: Medicare Other | Admitting: Orthopaedic Surgery

## 2021-11-01 VITALS — BP 106/73 | Ht 62.0 in | Wt 183.0 lb

## 2021-11-01 DIAGNOSIS — Z981 Arthrodesis status: Secondary | ICD-10-CM | POA: Diagnosis not present

## 2021-11-01 NOTE — Progress Notes (Addendum)
Postop single level fusion.  X-rays show no motion with flexion-extension views.  Good position of screws and graft.  Incision looks good good relief of preop pain we will release her from care and check her back again on an as-needed basis.  She is in room resume all normal workout activities.  She has had some problems with her lower back previous MRI 2014 showed some disc protrusion.  Increased pain with turning twisting.  She like to make an appointment after the holidays and can call about coming in for evaluation of this and we can obtain some x-rays and discussed treatment options.

## 2021-11-08 ENCOUNTER — Other Ambulatory Visit: Payer: Self-pay

## 2021-11-08 DIAGNOSIS — E039 Hypothyroidism, unspecified: Secondary | ICD-10-CM

## 2021-11-08 MED ORDER — LEVOTHYROXINE SODIUM 100 MCG PO TABS: ORAL_TABLET | ORAL | 1 refills | Status: DC

## 2021-12-06 NOTE — Progress Notes (Signed)
3 MONTH FOLLOW UP  Assessment / Plan:   Desiree Anderson was seen today for 3 month follow-up   Diagnoses and all orders for this visit:  Essential hypertension Currently taking only Olmesartan 40 mg 1/2 tab daily and BP log showed control- continue current dosage Monitor blood pressure at home; call if consistently over 130/80 Continue DASH diet.   Reminder to go to the ER if any CP, SOB, nausea, dizziness, severe HA, changes vision/speech, left arm numbness and tingling and jaw pain. -     CBC with Differential/Platelet -     COMPLETE METABOLIC PANEL WITH GFR -     Magnesium  Hyperlipidemia, mixed Continue medications: Crestor 55m and zetia 18mContinue low cholesterol diet and exercise.  Check lipid panel.  -     Lipid panel  Fibromyalgia Pt is complaining of worsening symptoms, states muscle relaxants have helped in past Using Robaxin as needed Encouraged to restart Gabapentin for pain  Vitamin D deficiency Continue supplementation   Hypothyroidism, unspecified type Currently taking 10027mlevothyroxine daily Instructed to take thyroid medication greater than 30 min before breakfast, separated by at least 4 hours  from antacids, calcium, iron, and multivitamins.    Gastroesophageal reflux disease, esophagitis presence not specified Doing well at this time Continue PPI/H2 blocker, diet discussed  Hypokalemia Taking Rx supplementation Will check labs  Anxiety Doing well on current regiment Taking Alprazolam 0.5mg77mlf tablet Discussed taking only PRN and limiting to 5 days a week to avoid addiction  Abnormal glucose Continue medication Discussed dietary and exercise modifications -     Hemoglobin A1c   Medication management -     CBC with Differential/Platelet -     COMPLETE METABOLIC PANEL WITH GFR -     Lipid panel -     TSH -     Hemoglobin A1c   History of breast cancer/ Malignant neoplasm of upper-outer quadrant of right breast in female, estrogen  receptor positive (HCC) Regular mammograms, follows with oncology Dr MagrBilly Fischer completed Tamoxifen  Insomnia, unspecified type Doing well at this time Discussed good sleep hygiene, decrease stimulation prior to sleep Increase day time activity Avoid caffeine in evenings Try OTC Melatonin 10mg32mbenedryl  URI Acute Amoxicillin 500 mg 1 po TID x 10 Promethazine DM cough syrup q 6 hours as needed- advised to use at night as can make her sleepy Tessalon perles 100 mg 1 cap q 6 hours as needed Continue Mucinex for decongestant Continue Zyrtec daily for the next 2 weeks     Over 30 minutes of interview exam, counseling, chart review and critical decision making was performed  Future Appointments  Date Time Provider [place]6/2023  3:00 PM McKeoUnk PintoGAAM-GAAIM None  08/31/2022 11:00 AM Ciella Obi,Magda BernheimGAAM-GAAIM None      Subjective:  Desiree Anderson 66 y.66 female who presents for 3 month follow up on HTN, HLD, hypothyroidism, GERD, weight and vitamin D Deficency.  She has had sinus congestion, pressure, productive cough of yellow mucus. Feels feverish, myalgias and headaches. Symptoms have been present for 4 days. Covid and flu are negative today.  Also has history of breast cancer, right, s/p mastectomy (04/2016) with radiation and chemo.  She has finished her Tamoxifen and follows with Dr MagriJana Hakimst appointment was 07/09/19.  She had an ultrasound of right breast reagrding palpable changes.  Oil cysts noted as well as fat changes. 08/25/21 mammogram and biopsy which showed no  malignancy  She has history of fibromyalgia and chronic pain from DDD/DJD. She had cervical fusion of C 5&6 in 08/2021 and pain is improved.  Had limited ROM to the right side.  Getting up twice during the night to use the bathroom.  Denies any dysuria, fevers or hematuria.   BMI is Body mass index is 32.15 kg/m., she has not been working on diet and exercise. 9/27 weight  was at a different office Wt Readings from Last 3 Encounters:  12/08/21 175 lb 12.8 oz (79.7 kg)  11/01/21 183 lb (83 kg)  09/27/21 183 lb (83 kg)    HTN predates 2004.  Her blood pressure has been controlled at home, today their BP is BP: 102/74 She does workout. She denies chest pain, shortness of breath, dizziness.   She is on cholesterol medication and denies myalgias. Her cholesterol is not at goal. The cholesterol last visit was:   Lab Results  Component Value Date   CHOL 157 07/17/2019   HDL 51 07/17/2019   LDLCALC 84 07/17/2019   TRIG 123 07/17/2019   CHOLHDL 3.1 07/17/2019    She has been working on diet and exercise for abnormal glucose/prediabetes, and denies hyperglycemia, hypoglycemia , nausea, polydipsia, polyuria and visual disturbances. Last A1C in the office was:  Lab Results  Component Value Date   HGBA1C 5.9 (H) 01/02/2019   Last GFR:   Lab Results  Component Value Date   GFRNONAA 96 01/02/2019   Lab Results  Component Value Date   GFRAA 111 01/02/2019   Patient is on Vitamin D supplement.  Deficiency noted in 09/2007.  Last check she was at goal. Lab Results  Component Value Date   VD25OH 75 01/02/2019     She has been on thyroid replacement since 2002. Currently taking levothyroxine 51mg daily.  Her medication was changed last visit. Lab Results  Component Value Date   TSH 0.29 (L) 04/15/2019      Medication Review: Current Outpatient Medications on File Prior to Visit  Medication Sig Dispense Refill   ALPRAZolam (XANAX) 0.5 MG tablet TAKE 1/2-1 TABLET BY MOUTH 2-3 TIMES DAILY ONLY IF NEEDED FOR ANXIETY ATTACK AND LIMIT TO 5 DAYS A WEEK TO AVOID ADDICTION AND DEMENTIA 30 tablet 0   Ascorbic Acid (VITAMIN C) 1000 MG tablet Take 1,000 mg by mouth daily.     cetirizine (ZYRTEC) 10 MG tablet Take 10 mg by mouth daily as needed for allergies.     Cholecalciferol (VITAMIN D-3) 5000 UNITS TABS Take 5,000 Units by mouth 2 (two) times daily.       cholestyramine (QUESTRAN) 4 g packet Take 1 packet (4 g total) by mouth 2 (two) times daily. 60 each 5   ezetimibe (ZETIA) 10 MG tablet TAKE 1 TABLET BY MOUTH ONCE DAILY FOR CHOLESTEROL 90 tablet 1   famotidine (PEPCID) 40 MG tablet Take 40 mg by mouth daily as needed for heartburn or indigestion.     fluticasone (FLONASE) 50 MCG/ACT nasal spray USE TWO SPRAY(S) IN EACH NOSTRIL ONCE DAILY AS NEEDED 48 g 3   hyoscyamine (LEVSIN SL) 0.125 MG SL tablet DISSOLVE 1 TABLET IN MOUTH EVERY 6 HOURS AS NEEDED 60 tablet 0   levothyroxine (SYNTHROID) 100 MCG tablet Take  1 tablet  Daily  on an empty stomach with only water  for 30 minutes & no Antacid meds, Calcium or Magnesium for 4 hours & avoid Biotin 90 tablet 1   meclizine (ANTIVERT) 25 MG tablet 1/2-1 pill up  to 3 times daily for motion sickness/dizziness (Patient taking differently: Take 12.5-25 mg by mouth 3 (three) times daily as needed for dizziness or nausea.) 30 tablet 0   metFORMIN (GLUCOPHAGE) 500 MG tablet 2 tabs twice a day with meals 120 tablet 2   methocarbamol (ROBAXIN) 500 MG tablet Take 1 tablet (500 mg total) by mouth every 6 (six) hours as needed for muscle spasms. 60 tablet 0   olmesartan (BENICAR) 40 MG tablet Take  1 /2 to 1 tablet  Daily  for BP (Patient taking differently: Take 20 mg by mouth daily.) 90 tablet 3   oxyCODONE-acetaminophen (PERCOCET/ROXICET) 5-325 MG tablet Take 1 tablet by mouth every 6 (six) hours as needed for severe pain. 50 tablet 0   potassium chloride (MICRO-K) 10 MEQ CR capsule Take 2 capsules 2 x /day for Potassium 360 capsule 3   rizatriptan (MAXALT) 10 MG tablet Take 10 mg by mouth as needed for migraine. May repeat in 2 hours if needed     rosuvastatin (CRESTOR) 10 MG tablet Take  1 tablet  Daily for Cholesterol   /TAKE 1 TABLET BY MOUTH ONCE DAILY FOR CHOLESTEROL 90 tablet 3   topiramate (TOPAMAX) 50 MG tablet Take  1 tablet  2 x /day  at Suppertime & Bedtime for Dieting & Weight loss                                                /                     TAKE 1/2 TO 1 TABLET BY MOUTH 180 tablet 3   venlafaxine XR (EFFEXOR-XR) 37.5 MG 24 hr capsule Take 1 capsule (37.5 mg total) by mouth daily with breakfast. 90 capsule 4   zinc gluconate 50 MG tablet Take 50 mg by mouth daily.     Blood Glucose Monitoring Suppl w/Device KIT Test blood sugar once daily or as directed. 1 kit 0   gabapentin (NEURONTIN) 600 MG tablet TAKE 1/2 TO 1 (ONE-HALF TO ONE) TABLET BY MOUTH 2 TO 3 TIMES DAILY AS  NEEDED  TO  CONTROL  PAIN (Patient not taking: Reported on 12/08/2021) 270 tablet 0   glucose blood (FREESTYLE LITE) test strip Test blood sugar once daily or as directed. 100 each 12   Lancets MISC Test blood sugar once daily or as directed. 200 each 11   promethazine-dextromethorphan (PROMETHAZINE-DM) 6.25-15 MG/5ML syrup Take 5 mLs by mouth 4 (four) times daily as needed for cough. (Patient not taking: Reported on 12/08/2021) 240 mL 1   No current facility-administered medications on file prior to visit.    Allergies  Allergen Reactions   Lipitor [Atorvastatin] Other (See Comments)    Muscle and legs aches   Shellfish Allergy Diarrhea and Nausea And Vomiting   Betadine [Povidone Iodine] Rash   Iodine     Pt unsure of reaction     Current Problems (verified) Patient Active Problem List   Diagnosis Date Noted   Cervical spinal stenosis 09/16/2021   Spinal stenosis of cervical region 07/04/2021   Other spondylosis with radiculopathy, cervical region 03/29/2021   Insomnia 03/25/2018   Situational anxiety 03/25/2018   Encounter for Medicare annual wellness exam 02/19/2017   Tobacco chew use 05/15/2016   Malignant neoplasm of upper-outer quadrant of right breast in female, estrogen receptor  positive ([facility]) 04/21/2016   Hypothyroidism 04/18/2016   Obesity (BMI 30.0-34.9) 10/11/2015   Medication management 09/24/2014   Cystocele, midline 03/23/2014   Essential hypertension 12/10/2013   Mixed hyperlipidemia  12/10/2013   Prediabetes 12/10/2013   Vitamin D deficiency 12/10/2013   DJD  12/10/2013   DDD, lumbar 12/10/2013   Fibromyalgia Syndrome 12/10/2013    Screening Tests Immunization History  Administered Date(s) Administered   Influenza Inj Mdck Quad With Preservative 09/04/2017, 09/30/2018, 09/15/2020   Influenza Split 09/18/2013, 09/25/2014, 10/21/2019   Influenza, Seasonal, Injecte, Preservative Fre 10/11/2015   Influenza,inj,Quad PF,6+ Mos 08/31/2021   Influenza,inj,quad, With Preservative 08/28/2016, 08/27/2017   Influenza-Unspecified 08/27/2016   PFIZER(Purple Top)SARS-COV-2 Vaccination 02/20/2020, 03/12/2020   Pneumococcal-Unspecified 11/27/2000   Tdap 03/16/2011    Preventative care: Last colonoscopy:2021 repeat due 2031 Last mammogram: 07/2021 mammogram and U/S with biopsy 08/24/21 revealed scar tissue Last pap smear/pelvic exam: 2017   DEXA: At age age 81years  Prior vaccinations: TD or Tdap: 2012  Influenza: 09/2019, received today Pneumococcal: 2002 Prevnar13: Due at age 21 Shingles/Zostavax: DUE, discussed Shingrix   Names of Other Physician/Practitioners you currently use: 1. [facility] here for primary care 2. Eye Exam Dr Manuella Ghazi 2021 3. Dental Exam Dr Buelah Manis 2022  Patient Care Team: Unk Pinto, MD as PCP - General (Internal Medicine) Fay Records, MD as Consulting Physician (Cardiology) [name], MD as Consulting Physician (Orthopedic Surgery) Vanessa Kick, MD as Consulting Physician (Obstetrics and Gynecology) Magrinat, Virgie Dad, MD as Consulting Physician (Oncology) Rolm Bookbinder, MD as Consulting Physician (General Surgery) Thea Silversmith, MD as Consulting Physician (Radiation Oncology)  SURGICAL HISTORY She  has a past surgical history that includes Incontinence surgery (2009  &  2005); BLADDER TACK (2003); Vein bypass surgery (AGE 38); transthoracic echocardiogram (06-16-2013); Cardiovascular  stress test (07-02-2013  DR [facility]); CYSTO/  HYDRODISTENTION/  BLADDER BX/   INSTILLATION THERAPY (08-29-2010); Vaginal hysterectomy (2000); Cataract extraction w/ intraocular lens implant (Left, 01/2014); Excision of mesh (N/A, 03/23/2014); Excision vaginal cyst (N/A, 04/06/2014); Radioactive seed guided mastectomy with axillary sentinel lymph node biopsy (Right, 05/18/2016); Breast lumpectomy (Right, 05/18/2016); Breast biopsy (Right, 04/18/2016); Cholecystectomy (N/A, 12/10/2017); Cataract extraction w/ intraocular lens implant (Right, 12/2017); Breast mass removal (Left, 1985); and Anterior cervical decomp/discectomy fusion (N/A, 09/16/2021). FAMILY HISTORY Her family history includes Diabetes in her mother; Diverticulitis in her mother and sister; Intestinal polyp in her sister; Lung cancer in her brother. SOCIAL HISTORY She  reports that she quit smoking about 26 years ago. Her smoking use included cigarettes. Her smokeless tobacco use includes snuff. She reports that she does not drink alcohol and does not use drugs.   Review of Systems  Constitutional:  Positive for chills. Negative for fever.  HENT:  Positive for congestion and sinus pain. Negative for hearing loss, sore throat and tinnitus.   Eyes:  Negative for blurred vision and double vision.  Respiratory:  Positive for cough (Productive of yellow mucus). Negative for hemoptysis, sputum production, shortness of breath and wheezing.   Cardiovascular:  Negative for chest pain, palpitations and leg swelling.  Gastrointestinal:  Negative for abdominal pain, constipation, diarrhea (rare), heartburn, nausea and vomiting.  Genitourinary:  Negative for dysuria and urgency.  Musculoskeletal:  Positive for back pain, myalgias and neck pain. Negative for falls and joint pain.  Skin:  Negative for rash.  Neurological:  Positive for tingling (down arms bilaterally). Negative for dizziness, tremors, weakness and headaches.  Endo/Heme/Allergies:   Does not bruise/bleed easily.  Psychiatric/Behavioral:  Negative for depression and suicidal ideas. The patient has insomnia. The patient is not nervous/anxious.     Objective:     Today's Vitals   12/08/21 1135  BP: 102/74  Pulse: 91  Temp: (!) 97.5 F (36.4 C)  SpO2: 96%  Weight: 175 lb 12.8 oz (79.7 kg)    Body mass index is 32.15 kg/m.  General appearance: alert, no distress, WD/WN, female HEENT: normocephalic, sclerae anicteric, TMs pearly, nares patent, erythema and yellow mucus noted, pharynx normal Oral cavity: MMM, no lesions Sinuses: positive frontal sinus tenderness Neck: supple, no lymphadenopathy, no thyromegaly, no masses Heart: RRR, normal S1, S2, no murmurs Lungs: CTA bilaterally, no wheezes, rhonchi, or rales Abdomen: +bs, soft, non tender, non distended, no masses, no hepatomegaly, no splenomegaly Musculoskeletal: nontender, no swelling, no obvious deformity Extremities: no edema, no cyanosis, no clubbing Pulses: 2+ symmetric, upper and lower extremities, normal cap refill Neurological: alert, oriented x 3, CN2-12 intact, strength normal upper extremities and lower extremities, sensation normal throughout, DTRs 2+ throughout, no cerebellar signs, gait normal Psychiatric: normal affect, behavior normal, pleasant   [facility]  12/08/2021

## 2021-12-08 ENCOUNTER — Encounter: Payer: Self-pay | Admitting: Nurse Practitioner

## 2021-12-08 ENCOUNTER — Ambulatory Visit (INDEPENDENT_AMBULATORY_CARE_PROVIDER_SITE_OTHER): Payer: Medicare Other | Admitting: Nurse Practitioner

## 2021-12-08 ENCOUNTER — Other Ambulatory Visit: Payer: Self-pay

## 2021-12-08 VITALS — BP 102/74 | HR 91 | Temp 97.5°F | Wt 175.8 lb

## 2021-12-08 DIAGNOSIS — C50411 Malignant neoplasm of upper-outer quadrant of right female breast: Secondary | ICD-10-CM

## 2021-12-08 DIAGNOSIS — Z1152 Encounter for screening for COVID-19: Secondary | ICD-10-CM

## 2021-12-08 DIAGNOSIS — R7309 Other abnormal glucose: Secondary | ICD-10-CM

## 2021-12-08 DIAGNOSIS — J069 Acute upper respiratory infection, unspecified: Secondary | ICD-10-CM | POA: Diagnosis not present

## 2021-12-08 DIAGNOSIS — F419 Anxiety disorder, unspecified: Secondary | ICD-10-CM

## 2021-12-08 DIAGNOSIS — M797 Fibromyalgia: Secondary | ICD-10-CM

## 2021-12-08 DIAGNOSIS — E782 Mixed hyperlipidemia: Secondary | ICD-10-CM | POA: Diagnosis not present

## 2021-12-08 DIAGNOSIS — Z170 Estrogen receptor positive status [ER+]: Secondary | ICD-10-CM

## 2021-12-08 DIAGNOSIS — E039 Hypothyroidism, unspecified: Secondary | ICD-10-CM

## 2021-12-08 DIAGNOSIS — G4700 Insomnia, unspecified: Secondary | ICD-10-CM

## 2021-12-08 DIAGNOSIS — R6889 Other general symptoms and signs: Secondary | ICD-10-CM | POA: Diagnosis not present

## 2021-12-08 DIAGNOSIS — E559 Vitamin D deficiency, unspecified: Secondary | ICD-10-CM

## 2021-12-08 DIAGNOSIS — I10 Essential (primary) hypertension: Secondary | ICD-10-CM

## 2021-12-08 DIAGNOSIS — K219 Gastro-esophageal reflux disease without esophagitis: Secondary | ICD-10-CM

## 2021-12-08 DIAGNOSIS — E876 Hypokalemia: Secondary | ICD-10-CM

## 2021-12-08 LAB — POC COVID19 BINAXNOW: SARS Coronavirus 2 Ag: NEGATIVE

## 2021-12-08 LAB — POCT INFLUENZA A/B
Influenza A, POC: NEGATIVE
Influenza B, POC: NEGATIVE

## 2021-12-08 MED ORDER — PROMETHAZINE-DM 6.25-15 MG/5ML PO SYRP: 5.0000 mL | ORAL_SOLUTION | Freq: Four times a day (QID) | ORAL | 1 refills | Status: DC | PRN

## 2021-12-08 MED ORDER — BENZONATATE 100 MG PO CAPS: 100.0000 mg | ORAL_CAPSULE | Freq: Four times a day (QID) | ORAL | 1 refills | Status: DC | PRN

## 2021-12-08 MED ORDER — GABAPENTIN 600 MG PO TABS: ORAL_TABLET | ORAL | 0 refills | Status: DC

## 2021-12-08 MED ORDER — AMOXICILLIN 500 MG PO TABS: 500.0000 mg | ORAL_TABLET | Freq: Three times a day (TID) | ORAL | 0 refills | Status: DC

## 2021-12-09 LAB — HEMOGLOBIN A1C
Hgb A1c MFr Bld: 5.8 % of total Hgb — ABNORMAL HIGH (ref <5.7)
Mean Plasma Glucose: 120 mg/dL
eAG (mmol/L): 6.6 mmol/L

## 2021-12-09 LAB — CBC WITH DIFFERENTIAL/PLATELET
Absolute Monocytes: 422 cells/uL (ref 200–950)
Basophils Absolute: 37 cells/uL (ref 0–200)
Basophils Relative: 0.5 %
Eosinophils Absolute: 67 cells/uL (ref 15–500)
Eosinophils Relative: 0.9 %
HCT: 38.9 % (ref 35.0–45.0)
Hemoglobin: 12.5 g/dL (ref 11.7–15.5)
Lymphs Abs: 3411 cells/uL (ref 850–3900)
MCH: 27.8 pg (ref 27.0–33.0)
MCHC: 32.1 g/dL (ref 32.0–36.0)
MCV: 86.4 fL (ref 80.0–100.0)
MPV: 10.4 fL (ref 7.5–12.5)
Monocytes Relative: 5.7 %
Neutro Abs: 3463 cells/uL (ref 1500–7800)
Neutrophils Relative %: 46.8 %
Platelets: 237 10*3/uL (ref 140–400)
RBC: 4.5 10*6/uL (ref 3.80–5.10)
RDW: 12.5 % (ref 11.0–15.0)
Total Lymphocyte: 46.1 %
WBC: 7.4 10*3/uL (ref 3.8–10.8)

## 2021-12-09 LAB — COMPLETE METABOLIC PANEL WITH GFR
AG Ratio: 1.8 (calc) (ref 1.0–2.5)
ALT: 26 U/L (ref 6–29)
AST: 28 U/L (ref 10–35)
Albumin: 3.7 g/dL (ref 3.6–5.1)
Alkaline phosphatase (APISO): 65 U/L (ref 37–153)
BUN: 8 mg/dL (ref 7–25)
CO2: 25 mmol/L (ref 20–32)
Calcium: 8.8 mg/dL (ref 8.6–10.4)
Chloride: 105 mmol/L (ref 98–110)
Creat: 0.58 mg/dL (ref 0.50–1.05)
Globulin: 2.1 g/dL (calc) (ref 1.9–3.7)
Glucose, Bld: 84 mg/dL (ref 65–99)
Potassium: 4 mmol/L (ref 3.5–5.3)
Sodium: 138 mmol/L (ref 135–146)
Total Bilirubin: 0.5 mg/dL (ref 0.2–1.2)
Total Protein: 5.8 g/dL — ABNORMAL LOW (ref 6.1–8.1)
eGFR: 101 mL/min/{1.73_m2} (ref >=60)

## 2021-12-09 LAB — LIPID PANEL
Cholesterol: 108 mg/dL (ref <200)
HDL: 45 mg/dL — ABNORMAL LOW (ref >=50)
LDL Cholesterol (Calc): 43 mg/dL (calc)
Non-HDL Cholesterol (Calc): 63 mg/dL (calc) (ref <130)
Total CHOL/HDL Ratio: 2.4 (calc) (ref <5.0)
Triglycerides: 112 mg/dL (ref <150)

## 2021-12-09 LAB — TSH: TSH: 0.21 mIU/L — ABNORMAL LOW (ref 0.40–4.50)

## 2022-01-16 ENCOUNTER — Other Ambulatory Visit: Payer: Self-pay | Admitting: Internal Medicine

## 2022-01-16 DIAGNOSIS — F419 Anxiety disorder, unspecified: Secondary | ICD-10-CM

## 2022-01-24 ENCOUNTER — Other Ambulatory Visit: Payer: Self-pay | Admitting: Nurse Practitioner

## 2022-01-24 DIAGNOSIS — R7309 Other abnormal glucose: Secondary | ICD-10-CM

## 2022-02-09 ENCOUNTER — Ambulatory Visit (INDEPENDENT_AMBULATORY_CARE_PROVIDER_SITE_OTHER): Payer: Medicare Other | Admitting: Internal Medicine

## 2022-02-09 ENCOUNTER — Other Ambulatory Visit: Payer: Self-pay

## 2022-02-09 VITALS — BP 118/70 | HR 90 | Temp 97.9°F | Resp 16 | Ht 62.0 in | Wt 170.6 lb

## 2022-02-09 DIAGNOSIS — E039 Hypothyroidism, unspecified: Secondary | ICD-10-CM | POA: Diagnosis not present

## 2022-02-09 DIAGNOSIS — I10 Essential (primary) hypertension: Secondary | ICD-10-CM

## 2022-02-09 DIAGNOSIS — Z720 Tobacco use: Secondary | ICD-10-CM

## 2022-02-09 DIAGNOSIS — Z136 Encounter for screening for cardiovascular disorders: Secondary | ICD-10-CM | POA: Diagnosis not present

## 2022-02-09 DIAGNOSIS — E559 Vitamin D deficiency, unspecified: Secondary | ICD-10-CM | POA: Diagnosis not present

## 2022-02-09 DIAGNOSIS — E1121 Type 2 diabetes mellitus with diabetic nephropathy: Secondary | ICD-10-CM | POA: Diagnosis not present

## 2022-02-09 DIAGNOSIS — Z0000 Encounter for general adult medical examination without abnormal findings: Secondary | ICD-10-CM | POA: Diagnosis not present

## 2022-02-09 MED ORDER — TRULICITY 0.75 MG/0.5ML ~~LOC~~ SOAJ: 0.7500 mg | SUBCUTANEOUS | 0 refills | Status: DC

## 2022-02-09 MED ORDER — TIRZEPATIDE 2.5 MG/0.5ML ~~LOC~~ SOAJ: 2.5000 mg | SUBCUTANEOUS | 0 refills | Status: DC

## 2022-02-09 MED ORDER — METFORMIN HCL ER 500 MG PO TB24: ORAL_TABLET | ORAL | 3 refills | Status: DC

## 2022-02-09 NOTE — Patient Instructions (Signed)

## 2022-02-09 NOTE — Progress Notes (Signed)
? ?Annual Screening/Preventative Visit ?& Comprehensive Evaluation &  Examination ? ?Future Appointments  ?Date Time Provider Department  ?02/09/2022  3:00 PM Unk Pinto, MD [facility]  ?08/31/2022 11:00 AM Magda Bernheim, NP [facility]  ?02/15/2023  2:00 PM Unk Pinto, MD [facility]  ? ? ?    This very nice 66 y.o. MWF presents for a Screening /Preventative Visit & comprehensive evaluation and management of multiple medical co-morbidities.  Patient has been followed for HTN, HLD, T2_NIDDM  and Vitamin D Deficiency. ? ? ?                                                  Patient has been on SS Disability since 2005 for Fibromyalgia and Chronic Pain Syndrome from DDD/DJD and Interstitial Cystitis.  ? ? ?     HTN predates since  2004. Patient has tapered her olmesartan due to low BPs - stopping 3 weeks ago & her BP has been controlled at home and patient denies any cardiac symptoms as chest pain, palpitations, shortness of breath, dizziness or ankle swelling. Today's BP is at goal 118/70.  ? ? ?    Patient's hyperlipidemia is controlled with diet and Rosuvastatin /Ezetimibe. Patient denies myalgias or other medication SE's. Last lipids were at goal : ? ?Lab Results  ?Component Value Date  ? CHOL 108 12/08/2021  ? HDL 45 (L) 12/08/2021  ? [place] 43 12/08/2021  ? TRIG 112 12/08/2021  ? CHOLHDL 2.4 12/08/2021  ? ? ? ?    Patient has  hx/o prediabetes (A1c 6.1% /2011) and then  T2_NIDDM (A1c 6.9% /Oct 2022)  and patient denies reactive hypoglycemic symptoms, visual blurring, diabetic polys or paresthesias. Last A1c was not at goal : ? ?Lab Results  ?Component Value Date  ? HGBA1C 5.8 (H) 12/08/2021  ? ? ?                                      Patient was dx'd Hypothyroid in 2002 & has been on Thyroid Replacement since.  ? ? ?    Finally, patient has history of Vitamin D Deficiency ("11" /2008)  and last Vitamin D was at goal : ? ?Lab Results  ?Component Value Date  ? VD25OH 93 02/02/2021  ? ? ? ?Current Outpatient  Medications on File Prior to Visit  ?Medication Sig  ? ALPRAZolam (XANAX) 0.5 MG tablet TAKE 1/2 TO 1 TABLET BY MOUTH 2-3 TIMES DAILY ONLY IF NEEDED   ? VITAMIN C 1000 MG tablet Take daily.  ? cetirizine  10 MG tablet Take 1 daily as needed for allergies.  ? VITAMIN D 5000 u  Take 2 times daily.   ? cholestyramine  4 g packet Take 1 packet  2 (times daily.  ? ezetimibe 10 MG tablet TAKE 1 TABLET DAILY   ? famotidine 40 MG tablet Take  daily as needed   ? FLONASE nasal spray USE 2 SPRAY EACH NOSTRIL DAILY   ? gabapentin  600 MG tablet TAKE 1/2 TO 1 TABLET  2 -3 x /DAILY AS  NEEDED    ? hyoscyamine SL 0.125 MG SL t DISSOLVE 1 TABLET EVERY 6 HOURS AS NEEDED  ? levothyroxine 100 MCG tablet Take  1 tablet  Daily    ?  meclizine 25 MG tablet 1/2-1 pill up to 3 times daily   ? metFORMIN 500 MG tablet TAKE 2 TABLETS  TWICE DAILY   ? methocarbamol 500 MG tablet Take 1 tablet every 6  hours as needed  ? olmesartan 40 MG tablet Take  1 /2 to 1 tablet  Daily    ? oxyCODONE-APAP  5-325 MG  Take 1 tablet every 6 hours as needed for severe pain.  ? potassium 10 MEQ  capsule Take 2 capsules 2 x /day for Potassium  ? rizatriptan (MAXALT) 10 MG  Take as needed for migraine. May repeat in 2 hrs if needed  ? rosuvastatin  10 MG tablet Take  1 tablet  Daily   ? topiramate 50 MG tablet Take  1 tablet  2 x /day    ? venlafaxine XR  37.5 MG Take 1 capsule daily   ? Zinc 50 MG tablet Take daily.  ? ? ? ? ?Allergies  ?Allergen Reactions  ? Lipitor [Atorvastatin] Other (See Comments)  ?  Muscle and legs aches  ? Shellfish Allergy Diarrhea and Nausea &Vomiting  ? Betadine [Povidone Iodine] Rash  ? ? ? ?Past Medical History:  ?Diagnosis Date  ? Anxiety   ? Breast cancer ([place])   ? Complication of anesthesia   ? hypotension  ? DDD (degenerative disc disease), lumbar   ? Diabetes mellitus without complication ([place])   ? Erosion of vaginal mesh ([facility])   ? Fibromyalgia   ? GERD (gastroesophageal reflux disease)   ? History of gastric ulcer   ? History  of radiation therapy 07/06/16- 08/22/16  ? Right Breast  ? Hypertension   ? Hypothyroidism   ? Personal history of radiation therapy   ? Rectocele   ? ? ? ?Health Maintenance  ?Topic Date Due  ? HIV Screening  Never done  ? Hepatitis C Screening  Never done  ? Zoster Vaccines- Shingrix (1 of 2) Never done  ? PAP SMEAR-Modifier  04/11/2019  ? Fecal DNA (Cologuard)  03/05/2020  ? COVID-19 Vaccine (3 - Pfizer risk series) 04/09/2020  ? TETANUS/TDAP  03/15/2021  ? MAMMOGRAM  08/06/2023  ? INFLUENZA VACCINE  Completed  ? HPV VACCINES  Aged Out  ? ? ? ?Immunization History  ?Administered Date(s) Administered  ? Influenza Inj Mdck Quad  09/04/2017, 09/30/2018, 09/15/2020  ? Influenza  09/18/2013, 09/25/2014, 10/21/2019  ? Influenza, Seasonal 10/11/2015  ? Influenza,inj,Quad  08/31/2021  ? Influenza,inj,quad 08/28/2016, 08/27/2017  ? Influenza 08/27/2016  ? PFIZER SARS-COV-2 Vacc 02/20/2020, 03/12/2020  ? Pneumococcal-23 11/27/2000  ? Tdap 03/16/2011  ? ? ?Cologard  03/05/2017 - Negative  ?Colon - 02.02.2021 - Dr Loletha Carrow -  Negative - recc 10 yr f/u - due Feb 2031 ? ?Last MGM - 08/26/2021 ? ? ?Past Surgical History:  ?Procedure Laterality Date  ? ANTERIOR CERVICAL DECOMP/DISCECTOMY FUSION N/A 09/16/2021  ? Procedure: C5-6 ANTERIOR CERVICAL DISCECTOMY FUSION, ALLOGRAFT, PLATE;  Surgeon: [name], MD;  Location: [facility];  Service: Orthopedics;  Laterality: N/A;  ? BLADDER TACK  2003  ? BREAST BIOPSY Right 04/18/2016  ? BREAST LUMPECTOMY Right 05/18/2016  ? Breast mass removal Left 1985  ? Benign  ? CARDIOVASCULAR STRESS TEST  07-02-2013  DR DALTON MCLEAN  ? LOW RISK NUCLEAR STUDY WITH A SMALL, MILD APICAL SEPTAL FIXED PERFUSION DEFECT .  GIVEN NORMAL WALL FUNCTION , SUSPECT REPRESENTS ATTENUATION/  EF 74%/  NO ISCHEMIA  ? CATARACT EXTRACTION W/ INTRAOCULAR LENS IMPLANT Left 01/2014  ? CATARACT  EXTRACTION W/ INTRAOCULAR LENS IMPLANT Right 12/2017  ? Dr. Manuella Ghazi  ? CHOLECYSTECTOMY N/A 12/10/2017  ? Procedure: LAPAROSCOPIC  CHOLECYSTECTOMY;  Surgeon: Rolm Bookbinder, MD;  Location: [facility];  Service: General;  Laterality: N/A;  ? CYSTO/  HYDRODISTENTION/  BLADDER BX/   INSTILLATION THERAPY  08-29-2010  ? EXCISION OF MESH N/A 03/23/2014  ? Procedure: EXCISION OF VAGINAL AND PERI-URETHRAL MESH, EXTRACTION FROM URETHRA TO APEX, insertion of XENFORM in the vaginal vault;  Surgeon: Ailene Rud, MD;  Location: [facility];  Service: Urology;  Laterality: N/A;  ? EXCISION VAGINAL CYST N/A 04/06/2014  ? Procedure: VAGINAL SPECULUM EXAMINATION/POSSIBLE DRAINAGE OF HEMATOMA;  Surgeon: Ailene Rud, MD;  Location: [facility];  Service: Urology;  Laterality: N/A;  ? INCONTINENCE SURGERY  2009  &  2005  ? RADIOACTIVE SEED GUIDED PARTIAL MASTECTOMY WITH AXILLARY SENTINEL LYMPH NODE BIOPSY Right 05/18/2016  ? Procedure: RADIOACTIVE SEED GUIDED RIGHT BREAST LUMPECTOMY WITH AXILLARY SENTINEL LYMPH NODE BIOPSY;  Surgeon: Rolm Bookbinder, MD;  Location: [facility];  Service: General;  Laterality: Right;  RADIOACTIVE SEED GUIDED RIGHT BREAST LUMPECTOMY WITH AXILLARY SENTINEL LYMPH NODE BIOPSY  ? TRANSTHORACIC ECHOCARDIOGRAM  06-16-2013  ? GRADE I DIASTOLIC DYSFUNCTION/  EF 55-60%/  MILD AR/  TRIVIAL MR  &  TR  ? VAGINAL HYSTERECTOMY  2000  ? w/  unilateral salpingoophorectomy  ? VEIN BYPASS SURGERY  AGE 30  ? RIGHT LEG  ? ? ? ?Family History  ?Problem Relation Age of Onset  ? Diabetes Mother   ? Diverticulitis Mother   ? Lung cancer Brother   ? Intestinal polyp Sister   ? Diverticulitis Sister   ? ? ? ?Social History  ? ?Tobacco Use  ? Smoking status: Former  ?  Years: 20.00  ?  Types: Cigarettes  ?  Quit date: 11/28/1995  ?  Years since quitting: 26.2  ? Smokeless tobacco: Current  ?  Types: Snuff  ? Tobacco comments:  ?  dips daily  ?Vaping Use  ? Vaping Use: Never used  ?Substance Use Topics  ? Alcohol use: No  ? Drug use: No  ? ? ? ? ROS ?Constitutional: Denies fever, chills, weight  loss/gain, headaches, insomnia,  night sweats, and change in appetite. Does c/o fatigue. ?Eyes: Denies redness, blurred vision, diplopia, discharge, itchy, watery eyes.  ?ENT: Denies discharge, congestion, post nasal d

## 2022-02-10 ENCOUNTER — Other Ambulatory Visit: Payer: Self-pay | Admitting: Internal Medicine

## 2022-02-10 DIAGNOSIS — E039 Hypothyroidism, unspecified: Secondary | ICD-10-CM

## 2022-02-10 LAB — MICROALBUMIN / CREATININE URINE RATIO
Creatinine, Urine: 16 mg/dL — ABNORMAL LOW (ref 20–275)
Microalb, Ur: <0.2 mg/dL

## 2022-02-10 LAB — COMPLETE METABOLIC PANEL WITH GFR
AG Ratio: 1.7 (calc) (ref 1.0–2.5)
ALT: 23 U/L (ref 6–29)
AST: 23 U/L (ref 10–35)
Albumin: 4 g/dL (ref 3.6–5.1)
Alkaline phosphatase (APISO): 71 U/L (ref 37–153)
BUN: 12 mg/dL (ref 7–25)
CO2: 27 mmol/L (ref 20–32)
Calcium: 9.7 mg/dL (ref 8.6–10.4)
Chloride: 104 mmol/L (ref 98–110)
Creat: 0.68 mg/dL (ref 0.50–1.05)
Globulin: 2.3 g/dL (calc) (ref 1.9–3.7)
Glucose, Bld: 90 mg/dL (ref 65–99)
Potassium: 4.5 mmol/L (ref 3.5–5.3)
Sodium: 141 mmol/L (ref 135–146)
Total Bilirubin: 0.4 mg/dL (ref 0.2–1.2)
Total Protein: 6.3 g/dL (ref 6.1–8.1)
eGFR: 97 mL/min/{1.73_m2} (ref >=60)

## 2022-02-10 LAB — URINALYSIS, ROUTINE W REFLEX MICROSCOPIC
Bilirubin Urine: NEGATIVE
Glucose, UA: NEGATIVE
Hgb urine dipstick: NEGATIVE
Ketones, ur: NEGATIVE
Leukocytes,Ua: NEGATIVE
Nitrite: NEGATIVE
Protein, ur: NEGATIVE
Specific Gravity, Urine: 1.004 (ref 1.001–1.035)
pH: 6.5 (ref 5.0–8.0)

## 2022-02-10 LAB — CBC WITH DIFFERENTIAL/PLATELET
Absolute Monocytes: 699 cells/uL (ref 200–950)
Basophils Absolute: 44 cells/uL (ref 0–200)
Basophils Relative: 0.4 %
Eosinophils Absolute: 89 cells/uL (ref 15–500)
Eosinophils Relative: 0.8 %
HCT: 38.2 % (ref 35.0–45.0)
Hemoglobin: 12.4 g/dL (ref 11.7–15.5)
Lymphs Abs: 5472 cells/uL — ABNORMAL HIGH (ref 850–3900)
MCH: 26.7 pg — ABNORMAL LOW (ref 27.0–33.0)
MCHC: 32.5 g/dL (ref 32.0–36.0)
MCV: 82.3 fL (ref 80.0–100.0)
MPV: 11.1 fL (ref 7.5–12.5)
Monocytes Relative: 6.3 %
Neutro Abs: 4795 cells/uL (ref 1500–7800)
Neutrophils Relative %: 43.2 %
Platelets: 284 10*3/uL (ref 140–400)
RBC: 4.64 10*6/uL (ref 3.80–5.10)
RDW: 13.1 % (ref 11.0–15.0)
Total Lymphocyte: 49.3 %
WBC: 11.1 10*3/uL — ABNORMAL HIGH (ref 3.8–10.8)

## 2022-02-10 LAB — VITAMIN D 25 HYDROXY (VIT D DEFICIENCY, FRACTURES): Vit D, 25-Hydroxy: 102 ng/mL — ABNORMAL HIGH (ref 30–100)

## 2022-02-10 LAB — TSH: TSH: 0.03 mIU/L — ABNORMAL LOW (ref 0.40–4.50)

## 2022-02-10 LAB — HEMOGLOBIN A1C
Hgb A1c MFr Bld: 6 % of total Hgb — ABNORMAL HIGH (ref <5.7)
Mean Plasma Glucose: 126 mg/dL
eAG (mmol/L): 7 mmol/L

## 2022-02-10 LAB — LIPID PANEL
Cholesterol: 113 mg/dL (ref <200)
HDL: 47 mg/dL — ABNORMAL LOW (ref >=50)
LDL Cholesterol (Calc): 44 mg/dL (calc)
Non-HDL Cholesterol (Calc): 66 mg/dL (calc) (ref <130)
Total CHOL/HDL Ratio: 2.4 (calc) (ref <5.0)
Triglycerides: 138 mg/dL (ref <150)

## 2022-02-10 LAB — MAGNESIUM: Magnesium: 1.9 mg/dL (ref 1.5–2.5)

## 2022-02-10 LAB — INSULIN, RANDOM: Insulin: 48.6 u[IU]/mL — ABNORMAL HIGH

## 2022-02-10 NOTE — Progress Notes (Signed)
<><><><><><><><><><><><><><><><><><><><><><><><><><><><><><><><><> ?<><><><><><><><><><><><><><><><><><><><><><><><><><><><><><><><><> ? ?-    TSH is very Low, which means Thyroid hormone level in blood is too high  ? ?- Currently on levothyroxine 100 mcg /day  ? ?- Recommend Decrease to  ?                                    1/2 tablet 3 x /week on Mon Wed & Fri &  ?                                     1  whole tablet 4 x /week on Tues Thurs Sat Sun  ? ?                                     Which = 5 & 1/2 tablets  /week  ?<><><><><><><><><><><><><><><><><><><><><><><><><><><><><><><><><> ?<><><><><><><><><><><><><><><><><><><><><><><><><><><><><><><><><> ? ?-  Please schedule a Nurse Visit in 1 month to recheck TSH (Thyroid level ) in blood  ?<><><><><><><><><><><><><><><><><><><><><><><><><><><><><><><><><> ?<><><><><><><><><><><><><><><><><><><><><><><><><><><><><><><><><> ? ?-  Total Chol = 113     &   LDL Chol = 44   -      both       Excellent  ? ?- Very low risk for Heart Attack  / Stroke ?============================================================ ?============================================================ ? ?-  A1c = 6.0 % is much better   ( was 6.9% just 5 months ago)  ?<><><><><><><><><><><><><><><><><><><><><><><><><><><><><><><><><> ?<><><><><><><><><><><><><><><><><><><><><><><><><><><><><><><><><> ? ?-  Vitamin D = 102  - Great  - Please keep dose same  ?<><><><><><><><><><><><><><><><><><><><><><><><><><><><><><><><><> ?<><><><><><><><><><><><><><><><><><><><><><><><><><><><><><><><><> ? ?-  All Else - CBC - Kidneys - Electrolytes - Liver - Magnesium & Thyroid   ? ?- all  Normal / OK ? ?<><><><><><><><><><><><><><><><><><><><><><><><><><><><><><><><><> ?<><><><><><><><><><><><><><><><><><><><><><><><><><><><><><><><><> ? ? ? ? ? ? ? ? ? ? ? ? ? ? ? ? ? ? ? ?

## 2022-02-12 ENCOUNTER — Encounter: Payer: Self-pay | Admitting: Internal Medicine

## 2022-02-28 ENCOUNTER — Other Ambulatory Visit: Payer: Self-pay | Admitting: Adult Health

## 2022-02-28 ENCOUNTER — Other Ambulatory Visit: Payer: Self-pay | Admitting: Adult Health Nurse Practitioner

## 2022-03-02 ENCOUNTER — Other Ambulatory Visit: Payer: Self-pay | Admitting: Nurse Practitioner

## 2022-03-02 ENCOUNTER — Other Ambulatory Visit: Payer: Self-pay | Admitting: Adult Health Nurse Practitioner

## 2022-03-02 DIAGNOSIS — E876 Hypokalemia: Secondary | ICD-10-CM

## 2022-03-02 MED ORDER — POTASSIUM CHLORIDE ER 10 MEQ PO CPCR: ORAL_CAPSULE | ORAL | 3 refills | Status: DC

## 2022-03-03 ENCOUNTER — Other Ambulatory Visit: Payer: Self-pay | Admitting: Internal Medicine

## 2022-03-03 ENCOUNTER — Other Ambulatory Visit: Payer: Self-pay | Admitting: Adult Health Nurse Practitioner

## 2022-03-03 DIAGNOSIS — E876 Hypokalemia: Secondary | ICD-10-CM

## 2022-03-03 MED ORDER — POTASSIUM CHLORIDE ER 10 MEQ PO CPCR: ORAL_CAPSULE | ORAL | 3 refills | Status: DC

## 2022-03-07 ENCOUNTER — Other Ambulatory Visit: Payer: Self-pay | Admitting: Internal Medicine

## 2022-03-20 ENCOUNTER — Ambulatory Visit (INDEPENDENT_AMBULATORY_CARE_PROVIDER_SITE_OTHER): Payer: Medicare Other

## 2022-03-20 DIAGNOSIS — E039 Hypothyroidism, unspecified: Secondary | ICD-10-CM

## 2022-03-20 LAB — TSH: TSH: 0.08 mIU/L — ABNORMAL LOW (ref 0.40–4.50)

## 2022-03-20 MED ORDER — FLUTICASONE PROPIONATE 50 MCG/ACT NA SUSP: NASAL | 3 refills | Status: DC

## 2022-03-20 NOTE — Progress Notes (Signed)
Patient present today for a Nurse Visit to recheck her TSH. Patient is taking Levothyroxine 1/2 pill on M, W, F and a whole pill Tues, Thur, Sat, and Sun. Taking on empty stomach with only water  for 30 minutes & no Antacid meds, Calcium or Magnesium for 4 hours & avoid Biotin. ?

## 2022-03-21 ENCOUNTER — Other Ambulatory Visit: Payer: Self-pay | Admitting: Internal Medicine

## 2022-03-21 DIAGNOSIS — E039 Hypothyroidism, unspecified: Secondary | ICD-10-CM

## 2022-03-21 NOTE — Progress Notes (Signed)
<><><><><><><><><><><><><><><><><><><><><><><><><><><><><><><><><> ?<><><><><><><><><><><><><><><><><><><><><><><><><><><><><><><><><> ? ?-    TSH still very low in Blood which means Thyroid hormone is too high in blood. ? ?- So Decrease levothyroxine 1000 mcg to  ?                                                   1/2 tablet ( 50 mcg ) EVERY Day - 7 days /week  ? ?- An be absolutely certain not taking high dose supplements of Biotin.  ? ?- Please schedule a NV in about 3 weeks to recheck Thyroid ? ?<><><><><><><><><><><><><><><><><><><><><><><><><><><><><><><><><> ?<><><><><><><><><><><><><><><><><><><><><><><><><><><><><><><><><> ?

## 2022-03-28 ENCOUNTER — Other Ambulatory Visit: Payer: Self-pay | Admitting: Nurse Practitioner

## 2022-03-28 DIAGNOSIS — F419 Anxiety disorder, unspecified: Secondary | ICD-10-CM

## 2022-03-28 MED ORDER — ALPRAZOLAM 0.5 MG PO TABS: ORAL_TABLET | ORAL | 0 refills | Status: DC

## 2022-04-01 ENCOUNTER — Other Ambulatory Visit: Payer: Self-pay | Admitting: Internal Medicine

## 2022-04-01 ENCOUNTER — Other Ambulatory Visit: Payer: Self-pay | Admitting: Nurse Practitioner

## 2022-04-01 DIAGNOSIS — E669 Obesity, unspecified: Secondary | ICD-10-CM

## 2022-04-01 MED ORDER — TRULICITY 1.5 MG/0.5ML ~~LOC~~ SOAJ
1.5000 mg | SUBCUTANEOUS | 0 refills | Status: DC
Start: 2022-04-01 — End: 2022-04-30

## 2022-04-02 ENCOUNTER — Other Ambulatory Visit: Payer: Self-pay | Admitting: Nurse Practitioner

## 2022-04-02 DIAGNOSIS — M797 Fibromyalgia: Secondary | ICD-10-CM

## 2022-04-12 ENCOUNTER — Encounter: Payer: Self-pay | Admitting: Nurse Practitioner

## 2022-04-12 ENCOUNTER — Ambulatory Visit (INDEPENDENT_AMBULATORY_CARE_PROVIDER_SITE_OTHER): Payer: Medicare Other | Admitting: Nurse Practitioner

## 2022-04-12 VITALS — BP 128/78 | HR 40 | Temp 96.6°F | Wt 159.0 lb

## 2022-04-12 DIAGNOSIS — R21 Rash and other nonspecific skin eruption: Secondary | ICD-10-CM

## 2022-04-12 DIAGNOSIS — L299 Pruritus, unspecified: Secondary | ICD-10-CM

## 2022-04-12 DIAGNOSIS — L237 Allergic contact dermatitis due to plants, except food: Secondary | ICD-10-CM

## 2022-04-12 DIAGNOSIS — L539 Erythematous condition, unspecified: Secondary | ICD-10-CM | POA: Diagnosis not present

## 2022-04-12 DIAGNOSIS — E039 Hypothyroidism, unspecified: Secondary | ICD-10-CM

## 2022-04-12 MED ORDER — DEXAMETHASONE SODIUM PHOSPHATE 10 MG/ML IJ SOLN
10.0000 mg | Freq: Once | INTRAMUSCULAR | Status: AC
  Administered 2022-04-12: 10 mg via INTRAMUSCULAR

## 2022-04-12 MED ORDER — PREDNISONE 20 MG PO TABS: ORAL_TABLET | ORAL | 0 refills | Status: DC

## 2022-04-12 NOTE — Progress Notes (Signed)
Assessment and Plan: ? ?Desiree Anderson was seen today for an episodic visit. ? ?Diagnoses and all order for this visit: ? ?1. Poison ivy ?Suggest OTC antihistamine.  ?Zyrtec samples provided. ? ?- dexamethasone (DECADRON) injection 10 mg ?- predniSONE (DELTASONE) 20 MG tablet; Take 2 tabs (40 mg) for 3 days followed by 1 tab (20 mg) for 4 days.  Dispense: 10 tablet; Refill: 0 ? ?2. Pruritus ?Topical Hydrocortisone  ?Refrain from scratching to avoid increase in infection.  ? ?3. Erythema ?Continue to  monitor for warmth, spreading of redness. ?Notify office if not improved. ? ? ?4. Localized rash ?Continue to monitor for spreading of rash ?Notify office if not improved. ? ?Notify office for further evaluation and treatment, questions or concerns if s/s fail to improve. The risks and benefits of my recommendations, as well as other treatment options were discussed with the patient today. Questions were answered. ? ?Further disposition pending results of labs. Discussed med's effects and SE's.   ? ?Over 15 minutes of exam, counseling, chart review, and critical decision making was performed.  ? ?Future Appointments  ?Date Time Provider [place]  ?05/23/2022 11:30 AM Darrol Jump, NP [facility] None  ?08/23/2022 10:45 AM Unk Pinto, MD [facility] None  ?11/22/2022 11:30 AM Magda Bernheim, NP [facility] None  ?02/15/2023  2:00 PM Unk Pinto, MD [facility] None  ? ? ?------------------------------------------------------------------------------------------------------------------ ? ? ?HPI ?BP 128/78   Pulse (!) 40   Temp (!) 96.6 ?F (35.9 ?C)   Wt 159 lb (72.1 kg)   SpO2 99%   BMI 29.08 kg/m?  ? ?Desiree Anderson is a 66 y.o. female who presents for evaluation of rash on bilateral arm. Onset of symptoms was gradual starting 4 days ago when pulling weeds from the yard, and has been gradually improving since that time. Symptoms include itching, blisters, and pain. Care prior to arrival consisted of OTC  ointment, with minimal relief. ? ? ?Past Medical History:  ?Diagnosis Date  ? Anxiety   ? Breast cancer ([place])   ? Complication of anesthesia   ? hypotension  ? DDD (degenerative disc disease), lumbar   ? Diabetes mellitus without complication ([place])   ? Erosion of vaginal mesh ([facility])   ? Fibromyalgia   ? GERD (gastroesophageal reflux disease)   ? History of gastric ulcer   ? History of radiation therapy 07/06/16- 08/22/16  ? Right Breast  ? Hypertension   ? Hypothyroidism   ? Personal history of radiation therapy   ? Rectocele   ?  ? ?Allergies  ?Allergen Reactions  ? Lipitor [Atorvastatin] Other (See Comments)  ?  Muscle and legs aches  ? Shellfish Allergy Diarrhea and Nausea And Vomiting  ? Betadine [Povidone Iodine] Rash  ? Iodine   ?  Pt unsure of reaction   ? ? ?Current Outpatient Medications on File Prior to Visit  ?Medication Sig  ? ALPRAZolam (XANAX) 0.5 MG tablet TAKE 1/2 TO 1 TABLET BY MOUTH 2-3 TIMES DAILY ONLY IF NEEDED FOR ANXIETY ATTACK AND LIMIT TO 5 DAYS A WEEK TO AVOID ADDICTION AND DEMENTIA  ? Ascorbic Acid (VITAMIN C) 1000 MG tablet Take 1,000 mg by mouth daily.  ? Blood Glucose Monitoring Suppl w/Device KIT Test blood sugar once daily or as directed.  ? cetirizine (ZYRTEC) 10 MG tablet Take 10 mg by mouth daily as needed for allergies.  ? Cholecalciferol (VITAMIN D-3) 5000 UNITS TABS Take 5,000 Units by mouth 2 (two) times daily.   ? cholestyramine Lucrezia Starch)  4 g packet Take 1 packet (4 g total) by mouth 2 (two) times daily.  ? Dulaglutide (TRULICITY) 1.5 ZO/1.0RU SOPN Inject 1.5 mg into the skin once a week.  ? ezetimibe (ZETIA) 10 MG tablet TAKE 1 TABLET BY MOUTH ONCE DAILY FOR CHOLESTEROL  ? famotidine (PEPCID) 40 MG tablet Take 40 mg by mouth daily as needed for heartburn or indigestion.  ? fluticasone (FLONASE) 50 MCG/ACT nasal spray USE TWO SPRAY(S) IN EACH NOSTRIL ONCE DAILY AS NEEDED  ? gabapentin (NEURONTIN) 600 MG tablet Take  1 tablet   3 x /day  for Chronic Pain                                        /                         TAKE                              BY                  MOUTH  ? glucose blood (FREESTYLE LITE) test strip Test blood sugar once daily or as directed.  ? hyoscyamine (LEVSIN SL) 0.125 MG SL tablet DISSOLVE 1 TABLET IN MOUTH EVERY 6 HOURS AS NEEDED  ? Lancets MISC Test blood sugar once daily or as directed.  ? levothyroxine (SYNTHROID) 100 MCG tablet Take  1 tablet  Daily  on an empty stomach with only water  for 30 minutes & no Antacid meds, Calcium or Magnesium for 4 hours & avoid Biotin (Patient taking differently: Takes 1/2 tablet daily)  ? meclizine (ANTIVERT) 25 MG tablet 1/2-1 pill up to 3 times daily for motion sickness/dizziness (Patient taking differently: Take 12.5-25 mg by mouth 3 (three) times daily as needed for dizziness or nausea.)  ? metFORMIN (GLUCOPHAGE-XR) 500 MG 24 hr tablet Take 2 tablets 2 x/ day with meals  for Diabetes  ? methocarbamol (ROBAXIN) 500 MG tablet Take 1 tablet (500 mg total) by mouth every 6 (six) hours as needed for muscle spasms.  ? olmesartan (BENICAR) 40 MG tablet Take  1 /2 to 1 tablet  Daily  for BP (Patient taking differently: Take 20 mg by mouth daily.)  ? oxyCODONE-acetaminophen (PERCOCET/ROXICET) 5-325 MG tablet Take 1 tablet by mouth every 6 (six) hours as needed for severe pain.  ? phentermine (ADIPEX-P) 37.5 MG tablet Take  1 tablet  every Morning  for Dieting & Weight Loss  ? potassium chloride SA (KLOR-CON M) 20 MEQ tablet TAKE 1 TABLET BY MOUTH TWICE DAILY FOR POTASSIUM  ? rizatriptan (MAXALT) 10 MG tablet Take 10 mg by mouth as needed for migraine. May repeat in 2 hours if needed  ? rosuvastatin (CRESTOR) 10 MG tablet Take  1 tablet  Daily for Cholesterol   /TAKE 1 TABLET BY MOUTH ONCE DAILY FOR CHOLESTEROL  ? topiramate (TOPAMAX) 50 MG tablet Take  1 tablet  2 x /day  at Suppertime & Bedtime for Dieting & Weight loss                                               /  TAKE 1/2 TO 1 TABLET BY MOUTH  ? venlafaxine  XR (EFFEXOR-XR) 37.5 MG 24 hr capsule Take 1 capsule (37.5 mg total) by mouth daily with breakfast.  ? zinc gluconate 50 MG tablet Take 50 mg by mouth daily.  ? ?No current facility-administered medications on file prior to visit.  ? ? ?ROS: all negative except what is noted in the HPI.  ? ?ROS  ? ?Physical Exam: ? ?BP 128/78   Pulse (!) 40   Temp (!) 96.6 ?F (35.9 ?C)   Wt 159 lb (72.1 kg)   SpO2 99%   BMI 29.08 kg/m?  ? ?General Appearance: NAD.  Awake, conversant and cooperative. ?Eyes: PERRLA, EOMs intact.  Sclera white.  Conjunctiva without erythema. ?Sinuses: No frontal/maxillary tenderness.  No nasal discharge. Nares patent.  ?ENT/Mouth: Ext aud canals clear.  Bilateral TMs w/DOL and without erythema or bulging. Hearing intact.  Posterior pharynx without swelling or exudate.  Tonsils without swelling or erythema.  ?Neck: Supple.  No masses, nodules or thyromegaly. ?Respiratory: Effort is regular with non-labored breathing. Breath sounds are equal bilaterally without rales, rhonchi, wheezing or stridor.  ?Cardio: RRR with no MRGs. Brisk peripheral pulses without edema.  ?Abdomen: Active BS in all four quadrants.  Soft and non-tender without guarding, rebound tenderness, hernias or masses. ?Lymphatics: Non tender without lymphadenopathy.  ?Musculoskeletal: Full ROM, 5/5 strength, normal ambulation.  No clubbing or cyanosis. ?Skin: Scattered areas of healed abrasion and multiple blisters along bilateral upper forearms.  Mild erythema.  Excoriation.  Surrounding skin intact.   Appropriate color for ethnicity. Warm. ?Neuro: CN II-XII grossly normal. Normal muscle tone without cerebellar symptoms and intact sensation.   ?Psych: AO X 3,  appropriate mood and affect, insight and judgment.  ?  ? ?Darrol Jump, NP ?12:11 PM ?[facility] ? ?

## 2022-04-13 LAB — TSH: TSH: 1.48 mIU/L (ref 0.40–4.50)

## 2022-04-13 NOTE — Progress Notes (Signed)
<><><><><><><><><><><><><><><><><><><><>  -   Thyroid level is perfect Now  !                                   Please keep dose same at                                     1/2 tablet every day   <><><><><><><><><><><><><><><><><><><><> <><><><><><><><><><><><><><><><><><><><>

## 2022-04-29 ENCOUNTER — Other Ambulatory Visit: Payer: Self-pay | Admitting: Internal Medicine

## 2022-05-10 ENCOUNTER — Other Ambulatory Visit: Payer: Medicare Other

## 2022-05-10 DIAGNOSIS — E039 Hypothyroidism, unspecified: Secondary | ICD-10-CM

## 2022-05-11 LAB — TSH: TSH: 43.11 mIU/L — ABNORMAL HIGH (ref 0.40–4.50)

## 2022-05-12 ENCOUNTER — Telehealth: Payer: Self-pay | Admitting: Nurse Practitioner

## 2022-05-12 NOTE — Telephone Encounter (Signed)
Pt is wanting to know the result of Tsh lab work

## 2022-05-13 NOTE — Progress Notes (Signed)
<><><><><><><><><><><><><><><><><><><><><><><><><><><><><><><><><> <><><><><><><><><><><><><><><><><><><><><><><><><><><><><><><><><> -   Test results slightly outside the reference range are not unusual. If there is anything important, I will review this with you,  otherwise it is considered normal test values.  If you have further questions,  please do not hesitate to contact me at the office or via My Chart.  <><><><><><><><><><><><><><><><><><><><><><><><><><><><><><><><><> <><><><><><><><><><><><><><><><><><><><><><><><><><><><><><><><><>  -  Last TSH  May 17 was Normal               &                                               patient was advised to continue thyroid  meds same  !   - Now TSH shows Thyroid is Dangerously Low &                                        appears as if not on Thyroid Meds for 2-3 weeks or longer   ( Could also get this reading if patient is taking a large amount of BIOTIN, but . . . .                                                 patient has previously been advised NOT to take Biotin )   - So please confirm how much Thyroid med patient is taking & when was the last dose  !

## 2022-05-21 ENCOUNTER — Other Ambulatory Visit: Payer: Self-pay | Admitting: Nurse Practitioner

## 2022-05-21 ENCOUNTER — Other Ambulatory Visit: Payer: Self-pay | Admitting: Internal Medicine

## 2022-05-21 DIAGNOSIS — E782 Mixed hyperlipidemia: Secondary | ICD-10-CM

## 2022-05-23 ENCOUNTER — Ambulatory Visit: Payer: Medicare Other | Admitting: Nurse Practitioner

## 2022-05-27 ENCOUNTER — Other Ambulatory Visit: Payer: Self-pay | Admitting: Internal Medicine

## 2022-05-27 ENCOUNTER — Other Ambulatory Visit: Payer: Self-pay | Admitting: Nurse Practitioner

## 2022-05-31 ENCOUNTER — Encounter: Payer: Self-pay | Admitting: Nurse Practitioner

## 2022-05-31 ENCOUNTER — Ambulatory Visit (INDEPENDENT_AMBULATORY_CARE_PROVIDER_SITE_OTHER): Payer: Medicare Other | Admitting: Nurse Practitioner

## 2022-05-31 VITALS — BP 130/80 | HR 69 | Temp 97.2°F | Wt 154.0 lb

## 2022-05-31 DIAGNOSIS — Z79899 Other long term (current) drug therapy: Secondary | ICD-10-CM

## 2022-05-31 DIAGNOSIS — E669 Obesity, unspecified: Secondary | ICD-10-CM

## 2022-05-31 DIAGNOSIS — I10 Essential (primary) hypertension: Secondary | ICD-10-CM

## 2022-05-31 DIAGNOSIS — E559 Vitamin D deficiency, unspecified: Secondary | ICD-10-CM | POA: Diagnosis not present

## 2022-05-31 DIAGNOSIS — E782 Mixed hyperlipidemia: Secondary | ICD-10-CM

## 2022-05-31 DIAGNOSIS — E039 Hypothyroidism, unspecified: Secondary | ICD-10-CM

## 2022-05-31 NOTE — Progress Notes (Signed)
Assessment and Plan:  Desiree Anderson was seen today for a follow up visit.  Diagnoses and all order for this visit:  1. Hypothyroidism, unspecified type Uncontrolled. Will assess any medication changes upon today's TSH results. Reminded to take on an empty stomach 30-31mins before food.  Continue to monitor.    - TSH  2. Essential hypertension Controlled  Discussed DASH (Dietary Approaches to Stop Hypertension) DASH diet is lower in sodium than a typical American diet. Cut back on foods that are high in saturated fat, cholesterol, and trans fats. Eat more whole-grain foods, fish, poultry, and nuts Remain active and exercise as tolerated daily.  Monitor BP at home-Call if greater than 130/80.    3. Mixed hyperlipidemia Controlled Discussed lifestyle modifications. Recommended diet heavy in fruits and veggies, omega 3's. Decrease consumption of animal meats, cheeses, and dairy products. Remain active and exercise as tolerated. Continue to monitor.   4. Vitamin D deficiency Slightly above goal 01/2022 Continue to monitor   5. Obesity (BMI 30.0-34.9) She is doing well with weight loss management. Discussed appropriate BMI Goal of losing 1 lb per month. Diet modification. Physical activity. Encouraged/praised to build confidence.  6. Medication management All medications discussed and reviewed in full. All questions and concerns regarding medications addressed.    Notify office for further evaluation and treatment, questions or concerns if s/s fail to improve. The risks and benefits of my recommendations, as well as other treatment options were discussed with the patient today. Questions were answered.  Further disposition pending results of labs. Discussed med's effects and SE's.    Over 20 minutes of exam, counseling, chart review, and critical decision making was performed.   Future Appointments  Date Time Provider Desiree Anderson  08/23/2022 10:45 AM Unk Pinto, MD [facility] None  11/22/2022 11:30 AM Alycia Rossetti, NP [facility] None  02/15/2023  2:00 PM Unk Pinto, MD [facility] None    ------------------------------------------------------------------------------------------------------------------   HPI BP 130/80   Pulse 69   Temp (!) 97.2 F (36.2 C)   Wt 154 lb (69.9 kg)   SpO2 99%   BMI 28.17 kg/m   66 y.o.female presents for general follow up.  Recent TSH levels have been above normal (43).  She has been taking Synthroid 50 mg daily for the last 3 weeks.    BMI is Body mass index is 28.17 kg/m., she has been working on diet and exercise.  She continues to aim for 1-3 lb monthly in weight loss.  Being diagnosed with diabetes has really encouraged her to eat better and focus on weight management.  Wt Readings from Last 3 Encounters:  05/31/22 154 lb (69.9 kg)  04/12/22 159 lb (72.1 kg)  03/20/22 162 lb (73.5 kg)    Her cholesterol is at goal.  She continues to incorporate lifestyle modifications.   Lipid Panel     Component Value Date/Time   CHOL 113 02/09/2022 1514   TRIG 138 02/09/2022 1514   HDL 47 (L) 02/09/2022 1514   CHOLHDL 2.4 02/09/2022 1514   VLDL 19 05/24/2017 1238   LDLCALC 44 02/09/2022 1514     Past Medical History:  Diagnosis Date   Anxiety    Breast cancer ([place])    Complication of anesthesia    hypotension   DDD (degenerative disc disease), lumbar    Diabetes mellitus without complication (HCC)    Erosion of vaginal mesh (HCC)    Fibromyalgia    GERD (gastroesophageal reflux disease)    History  of gastric ulcer    History of radiation therapy 07/06/16- 08/22/16   Right Breast   Hypertension    Hypothyroidism    Personal history of radiation therapy    Rectocele      Allergies  Allergen Reactions   Lipitor [Atorvastatin] Other (See Comments)    Muscle and legs aches   Shellfish Allergy Diarrhea and Nausea And Vomiting   Betadine [Povidone Iodine] Rash   Iodine     Pt  unsure of reaction     Current Outpatient Medications on File Prior to Visit  Medication Sig   ALPRAZolam (XANAX) 0.5 MG tablet TAKE 1/2 TO 1 TABLET BY MOUTH 2-3 TIMES DAILY ONLY IF NEEDED FOR ANXIETY ATTACK AND LIMIT TO 5 DAYS A WEEK TO AVOID ADDICTION AND DEMENTIA   Ascorbic Acid (VITAMIN C) 1000 MG tablet Take 1,000 mg by mouth daily.   Blood Glucose Monitoring Suppl w/Device KIT Test blood sugar once daily or as directed.   cetirizine (ZYRTEC) 10 MG tablet Take 10 mg by mouth daily as needed for allergies.   Cholecalciferol (VITAMIN D-3) 5000 UNITS TABS Take 5,000 Units by mouth 2 (two) times daily.    cholestyramine (QUESTRAN) 4 g packet Take 1 packet (4 g total) by mouth 2 (two) times daily.   ezetimibe (ZETIA) 10 MG tablet Take  1 tablet  Daily  for Cholesterol                                              /                    TAKE                        BY                  MOUTH                                  ONCE DAILY   famotidine (PEPCID) 40 MG tablet Take 40 mg by mouth daily as needed for heartburn or indigestion.   fluticasone (FLONASE) 50 MCG/ACT nasal spray USE TWO SPRAY(S) IN EACH NOSTRIL ONCE DAILY AS NEEDED   gabapentin (NEURONTIN) 600 MG tablet Take  1 tablet   3 x /day  for Chronic Pain                                       /                         TAKE                              BY                  MOUTH   glucose blood (FREESTYLE LITE) test strip Test blood sugar once daily or as directed.   hyoscyamine (LEVSIN SL) 0.125 MG SL tablet DISSOLVE 1 TABLET IN MOUTH EVERY 6 HOURS AS NEEDED   Lancets MISC Test blood sugar once daily or as directed.   levothyroxine (SYNTHROID) 100  MCG tablet Take  1 tablet  Daily  on an empty stomach with only water  for 30 minutes & no Antacid meds, Calcium or Magnesium for 4 hours & avoid Biotin (Patient taking differently: Takes 1/2 tablet daily)   meclizine (ANTIVERT) 25 MG tablet 1/2-1 pill up to 3 times daily for motion sickness/dizziness  (Patient taking differently: Take 12.5-25 mg by mouth 3 (three) times daily as needed for dizziness or nausea.)   metFORMIN (GLUCOPHAGE-XR) 500 MG 24 hr tablet Take 2 tablets 2 x/ day with meals  for Diabetes   methocarbamol (ROBAXIN) 500 MG tablet Take 1 tablet (500 mg total) by mouth every 6 (six) hours as needed for muscle spasms.   olmesartan (BENICAR) 40 MG tablet Take  1 /2 to 1 tablet  Daily  for BP (Patient taking differently: Take 20 mg by mouth daily.)   oxyCODONE-acetaminophen (PERCOCET/ROXICET) 5-325 MG tablet Take 1 tablet by mouth every 6 (six) hours as needed for severe pain.   phentermine (ADIPEX-P) 37.5 MG tablet Take  1 tablet  every Morning  for Dieting & Weight Loss   potassium chloride SA (KLOR-CON M) 20 MEQ tablet TAKE 1 TABLET BY MOUTH TWICE DAILY FOR  POTASSIUM   predniSONE (DELTASONE) 20 MG tablet Take 2 tabs (40 mg) for 3 days followed by 1 tab (20 mg) for 4 days.   rizatriptan (MAXALT) 10 MG tablet Take 10 mg by mouth as needed for migraine. May repeat in 2 hours if needed   rosuvastatin (CRESTOR) 10 MG tablet Take  1 tablet  Daily for Cholesterol                                           /                        TAKE                                    BY                        MOUTH                          ONCE DAILY   topiramate (TOPAMAX) 50 MG tablet Take  1 tablet  2 x /day  at Suppertime & Bedtime for Dieting & Weight loss                                               /                     TAKE 1/2 TO 1 TABLET BY MOUTH   TRULICITY 1.5 EZ/6.6QH SOPN INJECT 1/2 (ONE-HALF) ML SUBCUTANEOUSLY  ONCE A WEEK   venlafaxine XR (EFFEXOR-XR) 37.5 MG 24 hr capsule Take 1 capsule (37.5 mg total) by mouth daily with breakfast.   zinc gluconate 50 MG tablet Take 50 mg by mouth daily.   No current facility-administered medications on file prior to visit.    ROS: all negative except what is noted in the HPI.   Physical  Exam:  BP 130/80   Pulse 69   Temp (!) 97.2 F (36.2 C)    Wt 154 lb (69.9 kg)   SpO2 99%   BMI 28.17 kg/m   General Appearance: NAD.  Awake, conversant and cooperative. Eyes: PERRLA, EOMs intact.  Sclera white.  Conjunctiva without erythema. Sinuses: No frontal/maxillary tenderness.  No nasal discharge. Nares patent.  ENT/Mouth: Ext aud canals clear.  Bilateral TMs w/DOL and without erythema or bulging. Hearing intact.  Posterior pharynx without swelling or exudate.  Tonsils without swelling or erythema.  Neck: Supple.  No masses, nodules or thyromegaly. Respiratory: Effort is regular with non-labored breathing. Breath sounds are equal bilaterally without rales, rhonchi, wheezing or stridor.  Cardio: RRR with no MRGs. Brisk peripheral pulses without edema.  Abdomen: Active BS in all four quadrants.  Soft and non-tender without guarding, rebound tenderness, hernias or masses. Lymphatics: Non tender without lymphadenopathy.  Musculoskeletal: Full ROM, 5/5 strength, normal ambulation.  No clubbing or cyanosis. Skin: Appropriate color for ethnicity. Warm without rashes, lesions, ecchymosis, ulcers.  Neuro: CN II-XII grossly normal. Normal muscle tone without cerebellar symptoms and intact sensation.   Psych: AO X 3,  appropriate mood and affect, insight and judgment.     Darrol Jump, NP 11:54 AM [facility]

## 2022-06-01 LAB — TSH: TSH: 15.21 mIU/L — ABNORMAL HIGH (ref 0.40–4.50)

## 2022-06-05 ENCOUNTER — Telehealth: Payer: Self-pay | Admitting: Nurse Practitioner

## 2022-06-05 NOTE — Telephone Encounter (Signed)
She is most likely in the donut hole

## 2022-06-05 NOTE — Telephone Encounter (Signed)
When pt went to pick up trulicity it was going  be $200 , wanting to know what she needs to do

## 2022-06-06 NOTE — Telephone Encounter (Signed)
She has been following with Kenney Houseman so I would check with her

## 2022-06-07 ENCOUNTER — Other Ambulatory Visit: Payer: Self-pay | Admitting: Nurse Practitioner

## 2022-06-07 MED ORDER — RYBELSUS 3 MG PO TABS: 1.0000 | ORAL_TABLET | Freq: Every day | ORAL | 0 refills | Status: DC

## 2022-06-14 ENCOUNTER — Other Ambulatory Visit: Payer: Self-pay | Admitting: Nurse Practitioner

## 2022-06-14 DIAGNOSIS — F419 Anxiety disorder, unspecified: Secondary | ICD-10-CM

## 2022-06-14 DIAGNOSIS — G43109 Migraine with aura, not intractable, without status migrainosus: Secondary | ICD-10-CM

## 2022-06-14 MED ORDER — ALPRAZOLAM 0.5 MG PO TABS: ORAL_TABLET | ORAL | 0 refills | Status: DC

## 2022-06-14 NOTE — Progress Notes (Signed)
PDMP is reviewed and appropriate  

## 2022-06-16 ENCOUNTER — Other Ambulatory Visit: Payer: Self-pay | Admitting: Internal Medicine

## 2022-08-02 ENCOUNTER — Telehealth: Payer: Self-pay

## 2022-08-02 NOTE — Patient Outreach (Signed)
  Care Coordination   08/02/2022 Name: REONNA FINLAYSON MRN: [id number] DOB: January 01, 1956   Care Coordination Outreach Attempts:  An unsuccessful telephone outreach was attempted today to offer the patient information about available care coordination services as a benefit of their health plan.   Follow Up Plan:  Additional outreach attempts will be made to offer the patient care coordination information and services.   Encounter Outcome:  No Answer  Care Coordination Interventions Activated:  No   Care Coordination Interventions:  No, not indicated    Jone Baseman, RN, MSN [facility] Care Management Care Management Coordinator Direct Line [phone] Toll Free: [phone]  Fax: [phone]

## 2022-08-03 ENCOUNTER — Ambulatory Visit: Payer: Medicare Other | Admitting: Podiatry

## 2022-08-03 ENCOUNTER — Ambulatory Visit (INDEPENDENT_AMBULATORY_CARE_PROVIDER_SITE_OTHER): Payer: Medicare Other

## 2022-08-03 DIAGNOSIS — M779 Enthesopathy, unspecified: Secondary | ICD-10-CM | POA: Diagnosis not present

## 2022-08-03 DIAGNOSIS — M7671 Peroneal tendinitis, right leg: Secondary | ICD-10-CM

## 2022-08-03 MED ORDER — TRIAMCINOLONE ACETONIDE 10 MG/ML IJ SUSP: 10.0000 mg | Freq: Once | INTRAMUSCULAR | Status: DC

## 2022-08-04 NOTE — Progress Notes (Signed)
Subjective:   Patient ID: Desiree Anderson, female   DOB: 66 y.o.   MRN: [id number]   HPI Patient is presented with pain in the outside of the right foot stating its been very sore and hard to walk with and she does not remember injury been present for around a month neuro   ROS      Objective:  Physical Exam  Vascular status intact with inflammation pain around the insertion of the peroneal tendon into the base of the fifth metatarsal with fluid buildup around the area no indication of tendon dysfunction     Assessment:  Peroneal tendinitis right inflammation fluid around the insertion base of fifth metatarsal     Plan:  H&P reviewed condition and recommended treatment.  I did discuss injection explaining risk she wants to go ahead and go through with this and understands risk and I did sterile prep injected around the peroneal tendon insertion base of fifth metatarsal 3 mg dexamethasone Kenalog 5 mg Xylocaine and advised on ice therapy and dispensed fascial brace to lift up the lateral side of the foot.  Reappoint as symptoms indicate  X-rays indicate slight reactivity around the base of the fifth metatarsal no indication of acute fracture or arthritis

## 2022-08-08 ENCOUNTER — Other Ambulatory Visit: Payer: Self-pay

## 2022-08-08 DIAGNOSIS — E039 Hypothyroidism, unspecified: Secondary | ICD-10-CM

## 2022-08-08 MED ORDER — LEVOTHYROXINE SODIUM 100 MCG PO TABS: ORAL_TABLET | ORAL | 1 refills | Status: DC

## 2022-08-09 ENCOUNTER — Telehealth: Payer: Self-pay

## 2022-08-09 NOTE — Patient Outreach (Signed)
  Care Coordination   Initial Visit Note   08/09/2022 Name: AMELA HANDLEY MRN: [id number] DOB: 30-Apr-1956  DAIELLE MELCHER is a 66 y.o. year old female who sees Unk Pinto, MD for primary care. I spoke with  Reita May by phone today.  What matters to the patients health and wellness today?  Patient declined call.      Goals Addressed   None     SDOH assessments and interventions completed:  No     Care Coordination Interventions Activated:  No  Care Coordination Interventions:  No, not indicated   Follow up plan: No further intervention required.   Encounter Outcome:  Pt. Refused   Jone Baseman, RN, MSN [facility] Care Management Coordinator Direct Line [phone]

## 2022-08-16 ENCOUNTER — Ambulatory Visit: Payer: Medicare Other | Admitting: Nurse Practitioner

## 2022-08-23 ENCOUNTER — Ambulatory Visit: Payer: Medicare Other | Admitting: Internal Medicine

## 2022-08-31 ENCOUNTER — Ambulatory Visit: Payer: Medicare Other | Admitting: Nurse Practitioner

## 2022-08-31 ENCOUNTER — Other Ambulatory Visit: Payer: Self-pay | Admitting: Nurse Practitioner

## 2022-08-31 DIAGNOSIS — F419 Anxiety disorder, unspecified: Secondary | ICD-10-CM

## 2022-09-04 ENCOUNTER — Other Ambulatory Visit: Payer: Self-pay | Admitting: Internal Medicine

## 2022-09-04 DIAGNOSIS — Z1231 Encounter for screening mammogram for malignant neoplasm of breast: Secondary | ICD-10-CM

## 2022-09-10 ENCOUNTER — Encounter: Payer: Self-pay | Admitting: Internal Medicine

## 2022-09-10 NOTE — Patient Instructions (Signed)

## 2022-09-10 NOTE — Progress Notes (Unsigned)
Future Appointments  Date Time Provider Department  09/11/2022               6 mo 10:30 AM Unk Pinto, MD [facility]  12/13/2022                wellness 11:30 AM Alycia Rossetti, NP [facility]  03/28/2023                  cpe 10:00 AM Unk Pinto, MD [facility]    History of Present Illness:       This very nice 66 y.o.  MWF with HTN, HLD, Pre-Diabetes, Hypothyroidism and Vitamin D Deficiency  presents for 6 month follow up .  Patient's GERD is controlled on her Meds. Patient has hx/o ER (+) invasive Rt Breast Ca (2017) & was followed by Dr Jana Hakim on Tamoxifen for 5 yrs thru 2022.        Patient has been on SS Disability since 2005 for Fibromyalgia & Chronic Pain Syndrome from DDD/DJD and Interstitial Cystitis .        Patient is treated for HTN (2004) & BP has been controlled at home. Today's BP is at goal - 120/82 . Patient has had no complaints of any cardiac type chest pain, palpitations, dyspnea Vertell Limber /PND, dizziness, claudication or dependent edema.       Hyperlipidemia is controlled with diet & Rosuvastatin. Patient denies myalgias or other med SE's. Last Lipids were at goal:  Lab Results  Component Value Date   CHOL 113 02/09/2022   HDL 47 (L) 02/09/2022   LDLCALC 44 02/09/2022   TRIG 138 02/09/2022   CHOLHDL 2.4 02/09/2022    Also, the patient has moderate obesity (BMI 31+) and history of  PreDiabetes (A1c 6.1% / 2011) and has had no symptoms of reactive hypoglycemia, diabetic polys, paresthesias or visual blurring.  Last A1c was not at goal:  Lab Results  Component Value Date   HGBA1C 6.0 (H) 02/09/2022           Patient was dx'd Hypothyroid in 2002 & has been on Thyroid Replacement since  then.         Further, the patient also has history of Vitamin D Deficiency ("11" / 2008)  and supplements vitamin D without any suspected side-effects. Last vitamin D was at goal:  Lab Results  Component Value Date   VD25OH 102 (H) 02/09/2022     Current Outpatient Medications on File Prior to Visit  Medication Sig   ALPRAZolam (XANAX) 0.5 MG tablet TAKE 1/2 TO 1 TAB 2-3 x/day  ONLY IF NEEDED    VITAMIN C 1000 MG tablet Take daily.   cetirizine (ZYRTEC) 10 MG tablet Take daily as needed for allergies.   VITAMIN D  5000 UNITS  Take 2 (two) times daily.    cholestyramine 4 g packet Take 2 times daily.   ezetimibe (ZETIA) 10 MG tablet Take  1 tablet  Daily   famotidine (PEPCID) 40 MG tablet Take daily as needed for heartburn or indigestion.   FLONASE nasal spray USE TWO SPRAY(S) IN EACH NOSTRIL DAILY AS NEEDED   gabapentin 600 MG tablet Take  1 tablet   3 x /day  for Chronic Pain                       hyoscyamine  0.125 MG SL tablet DISSOLVE 1 TABLET  EVERY 6 HOURS AS NEEDED   levothyroxine  100 MCG tablet Take  1 tablet  Daily     meclizine (ANTIVERT) 25 MG tablet TAKE ONE-HALF TO ONE TABLET UP TO THREE TIMES DAILY FOR MOTION SICKNESS/DIZZINESS   metFORMIN-XR 500 MG  Take 2 tablets 2 x/ day with meals  for Diabetes   methocarbamol 500 MG tablet Take 1 tablet  every 6  hours as needed for muscle spasms.   olmesartan (BENICAR) 40 MG tablet Take  1 /2 tablet  Daily  for BP    PERCOCET 5-325 MG tablet Take 1 tablet every 6 hours as needed for severe pain.   phentermine (ADIPEX-P) 37.5 MG tablet Take  1 tablet  every Morning    potassium chloride SA (KLOR-CON M) 20 MEQ tablet TAKE 1 TABLET  TWICE DAILY FOR  POTASSIUM   predniSONE (DELTASONE) 20 MG tablet Take 2 tabs (40 mg) for 3 days followed by 1 tab (20 mg) for 4 days.   rizatriptan (MAXALT) 10 MG tablet Take as needed for migraine. May repeat in 2 hours if needed   rosuvastatin (CRESTOR) 10 MG tablet Take  1 tablet  Daily    Semaglutide (RYBELSUS) 3 MG TABS Take 1 tablet  daily.   topiramate (TOPAMAX) 50 MG tablet Take  1 tablet  2 x /day  at Suppertime & Bedtime   venlafaxine -XR) 37.5 MG 24 hr capsule Take 1 capsule  daily with breakfast.   zinc gluconate 50 MG tablet Take   daily.     Allergies  Allergen Reactions   Lipitor [Atorvastatin] Muscle and legs aches       Shellfish Allergy Diarrhea and Nausea And Vomiting   Betadine [Povidone Iodine] Rash   Iodine     Pt unsure of reaction     PMHx:   Past Medical History:  Diagnosis Date   Anxiety    Breast cancer ([place])    Complication of anesthesia    hypotension   DDD (degenerative disc disease), lumbar    Diabetes mellitus without complication ([place])    Erosion of vaginal mesh (HCC)    Fibromyalgia    GERD (gastroesophageal reflux disease)    History of gastric ulcer    History of radiation therapy 07/06/16- 08/22/16   Right Breast   Hypertension    Hypothyroidism    Personal history of radiation therapy    Rectocele     Immunization History  Administered Date(s) Administered   Influenza Inj Mdck Quad With Preservative 09/04/2017, 09/30/2018, 09/15/2020   Influenza Split 09/18/2013, 09/25/2014, 10/21/2019   Influenza, Seasonal, Injecte, Preservative Fre 10/11/2015   Influenza,inj,Quad PF,6+ Mos 08/31/2021   Influenza,inj,quad, With Preservative 08/28/2016, 08/27/2017   Influenza-Unspecified 08/27/2016   PFIZER(Purple Top)SARS-COV-2 Vaccination 02/20/2020, 03/12/2020   Pneumococcal-Unspecified 11/27/2000   Tdap 03/16/2011    Past Surgical History:  Procedure Laterality Date   ANTERIOR CERVICAL DECOMP/DISCECTOMY FUSION N/A 09/16/2021   Procedure: C5-6 ANTERIOR CERVICAL DISCECTOMY FUSION, ALLOGRAFT, PLATE;  Surgeon: [name], MD;  Location: [place];  Service: Orthopedics;  Laterality: N/A;   BLADDER TACK  2003   BREAST BIOPSY Right 04/18/2016   BREAST LUMPECTOMY Right 05/18/2016   Breast mass removal Left 1985   Benign   CARDIOVASCULAR STRESS TEST  07-02-2013  DR DALTON MCLEAN   LOW RISK NUCLEAR STUDY WITH A SMALL, MILD APICAL SEPTAL FIXED PERFUSION DEFECT .  GIVEN NORMAL WALL FUNCTION , SUSPECT REPRESENTS ATTENUATION/  EF 74%/  NO ISCHEMIA   CATARACT EXTRACTION W/ INTRAOCULAR LENS  IMPLANT Left 01/2014   CATARACT EXTRACTION W/ INTRAOCULAR LENS IMPLANT Right  12/2017   Dr. Manuella Ghazi   CHOLECYSTECTOMY N/A 12/10/2017   Procedure: LAPAROSCOPIC CHOLECYSTECTOMY;  Surgeon: Rolm Bookbinder, MD;  Location: [place];  Service: General;  Laterality: N/A;   CYSTO/  HYDRODISTENTION/  BLADDER BX/   INSTILLATION THERAPY  08-29-2010   EXCISION OF MESH N/A 03/23/2014   Procedure: EXCISION OF VAGINAL AND PERI-URETHRAL MESH, EXTRACTION FROM URETHRA TO APEX, insertion of XENFORM in the vaginal vault;  Surgeon: Ailene Rud, MD;  Location: [facility];  Service: Urology;  Laterality: N/A;   EXCISION VAGINAL CYST N/A 04/06/2014   Procedure: VAGINAL SPECULUM EXAMINATION/POSSIBLE DRAINAGE OF HEMATOMA;  Surgeon: Ailene Rud, MD;  Location: [facility];  Service: Urology;  Laterality: N/A;   INCONTINENCE SURGERY  2009  &  2005   RADIOACTIVE SEED GUIDED PARTIAL MASTECTOMY WITH AXILLARY SENTINEL LYMPH NODE BIOPSY Right 05/18/2016   Procedure: RADIOACTIVE SEED GUIDED RIGHT BREAST LUMPECTOMY WITH AXILLARY SENTINEL LYMPH NODE BIOPSY;  Surgeon: Rolm Bookbinder, MD;  Location: [place];  Service: General;  Laterality: Right;  RADIOACTIVE SEED GUIDED RIGHT BREAST LUMPECTOMY WITH AXILLARY SENTINEL LYMPH NODE BIOPSY   TRANSTHORACIC ECHOCARDIOGRAM  06-16-2013   GRADE I DIASTOLIC DYSFUNCTION/  EF 55-60%/  MILD AR/  TRIVIAL MR  &  TR   VAGINAL HYSTERECTOMY  2000   w/  unilateral salpingoophorectomy   VEIN BYPASS SURGERY  AGE 43   RIGHT LEG    FHx:    Reviewed / unchanged  SHx:    Reviewed / unchanged   Systems Review:  Constitutional: Denies fever, chills, wt changes, headaches, insomnia, fatigue, night sweats, change in appetite. Eyes: Denies redness, blurred vision, diplopia, discharge, itchy, watery eyes.  ENT: Denies discharge, congestion, post nasal drip, epistaxis, sore throat, earache, hearing loss, dental pain, tinnitus, vertigo, sinus  pain, snoring.  CV: Denies chest pain, palpitations, irregular heartbeat, syncope, dyspnea, diaphoresis, orthopnea, PND, claudication or edema. Respiratory: denies cough, dyspnea, DOE, pleurisy, hoarseness, laryngitis, wheezing.  Gastrointestinal: Denies dysphagia, odynophagia, heartburn, reflux, water brash, abdominal pain or cramps, nausea, vomiting, bloating, diarrhea, constipation, hematemesis, melena, hematochezia  or hemorrhoids. Genitourinary: Denies dysuria, frequency, urgency, nocturia, hesitancy, discharge, hematuria or flank pain. Musculoskeletal: Denies arthralgias, myalgias, stiffness, jt. swelling, pain, limping or strain/sprain.  Skin: Denies pruritus, rash, hives, warts, acne, eczema or change in skin lesion(s). Neuro: No weakness, tremor, incoordination, spasms, paresthesia or pain. Psychiatric: Denies confusion, memory loss or sensory loss. Endo: Denies change in weight, skin or hair change.  Heme/Lymph: No excessive bleeding, bruising or enlarged lymph nodes.  Physical Exam  BP 120/82   Pulse 75   Temp 97.9 F (36.6 C)   Resp 16   Ht '5\' 2"'$  (1.575 m)   Wt 161 lb (73 kg)   SpO2 99%   BMI 29.45 kg/m   Appears  well nourished, well groomed  and in no distress.  Eyes: PERRLA, EOMs, conjunctiva no swelling or erythema. Sinuses: No frontal/maxillary tenderness ENT/Mouth: EAC's clear, TM's nl w/o erythema, bulging. Nares clear w/o erythema, swelling, exudates. Oropharynx clear without erythema or exudates. Oral hygiene is good. Tongue normal, non obstructing. Hearing intact.  Neck: Supple. Thyroid not palpable. Car 2+/2+ without bruits, nodes or JVD. Chest: Respirations nl with BS clear & equal w/o rales, rhonchi, wheezing or stridor.  Cor: Heart sounds normal w/ regular rate and rhythm without sig. murmurs, gallops, clicks or rubs. Peripheral pulses normal and equal  without edema.  Abdomen: Soft & bowel sounds normal. Non-tender w/o guarding, rebound, hernias, masses or  organomegaly.  Lymphatics: Unremarkable.  Musculoskeletal: Full ROM all peripheral extremities, joint stability, 5/5 strength and normal gait.  (+) exquisite tender point tenderness of superior anterior joint line of Lt shoulder.  Skin: Warm, dry without exposed rashes, lesions or ecchymosis apparent.  Neuro: Cranial nerves intact, reflexes equal bilaterally. Sensory-motor testing grossly intact. Tendon reflexes grossly intact.  Pysch: Alert & oriented x 3.  Insight and judgement nl & appropriate. No ideations.  Assessment and Plan:  1. Essential hypertension  - CBC with Differential/Platelet - COMPLETE METABOLIC PANEL WITH GFR - Magnesium - TSH  2. Hyperlipidemia, mixed  - Lipid panel - TSH  3. Abnormal glucose  - Hemoglobin A1c - Insulin, random  4. Vitamin D deficiency  - VITAMIN D 25 Hydroxy   5. Hypothyroidism  - Hemoglobin A1c  6. Gastroesophageal reflux disease - CBC with Differential/Platelet  7. Medication management  - CBC with Differential/Platelet - COMPLETE METABOLIC PANEL WITH GFR - Magnesium - Lipid panel - TSH - Hemoglobin A1c - Insulin, random - VITAMIN D 25 Hydroxy           Discussed  regular exercise, BP monitoring, weight control to achieve/maintain BMI less than 25 and discussed med and SE's. Recommended labs to assess and monitor clinical status with further disposition pending results of labs.  I discussed the assessment and treatment plan with the patient. The patient was provided an opportunity to ask questions and all were answered. The patient agreed with the plan and demonstrated an understanding of the instructions.  I provided over 30 minutes of exam, counseling, chart review and  complex critical decision making.         The patient was advised to call back or seek an in-person evaluation if the symptoms worsen or if the condition fails to improve as anticipated.   Kirtland Bouchard, MD

## 2022-09-11 ENCOUNTER — Ambulatory Visit (INDEPENDENT_AMBULATORY_CARE_PROVIDER_SITE_OTHER): Payer: Medicare Other | Admitting: Internal Medicine

## 2022-09-11 ENCOUNTER — Encounter: Payer: Self-pay | Admitting: Internal Medicine

## 2022-09-11 VITALS — BP 120/82 | HR 75 | Temp 97.9°F | Resp 16 | Ht 62.0 in | Wt 161.0 lb

## 2022-09-11 DIAGNOSIS — E782 Mixed hyperlipidemia: Secondary | ICD-10-CM

## 2022-09-11 DIAGNOSIS — Z23 Encounter for immunization: Secondary | ICD-10-CM

## 2022-09-11 DIAGNOSIS — I10 Essential (primary) hypertension: Secondary | ICD-10-CM

## 2022-09-11 DIAGNOSIS — E039 Hypothyroidism, unspecified: Secondary | ICD-10-CM

## 2022-09-11 DIAGNOSIS — Z79899 Other long term (current) drug therapy: Secondary | ICD-10-CM | POA: Diagnosis not present

## 2022-09-11 DIAGNOSIS — R7309 Other abnormal glucose: Secondary | ICD-10-CM | POA: Diagnosis not present

## 2022-09-11 DIAGNOSIS — E559 Vitamin D deficiency, unspecified: Secondary | ICD-10-CM

## 2022-09-11 DIAGNOSIS — K219 Gastro-esophageal reflux disease without esophagitis: Secondary | ICD-10-CM

## 2022-09-12 ENCOUNTER — Other Ambulatory Visit: Payer: Self-pay | Admitting: Internal Medicine

## 2022-09-12 DIAGNOSIS — E039 Hypothyroidism, unspecified: Secondary | ICD-10-CM

## 2022-09-12 LAB — HEMOGLOBIN A1C
Hgb A1c MFr Bld: 6.1 % of total Hgb — ABNORMAL HIGH (ref <5.7)
Mean Plasma Glucose: 128 mg/dL
eAG (mmol/L): 7.1 mmol/L

## 2022-09-12 LAB — COMPLETE METABOLIC PANEL WITH GFR
AG Ratio: 1.6 (calc) (ref 1.0–2.5)
ALT: 27 U/L (ref 6–29)
AST: 26 U/L (ref 10–35)
Albumin: 4.4 g/dL (ref 3.6–5.1)
Alkaline phosphatase (APISO): 104 U/L (ref 37–153)
BUN: 10 mg/dL (ref 7–25)
CO2: 30 mmol/L (ref 20–32)
Calcium: 9.6 mg/dL (ref 8.6–10.4)
Chloride: 98 mmol/L (ref 98–110)
Creat: 0.71 mg/dL (ref 0.50–1.05)
Globulin: 2.8 g/dL (calc) (ref 1.9–3.7)
Glucose, Bld: 103 mg/dL — ABNORMAL HIGH (ref 65–99)
Potassium: 4 mmol/L (ref 3.5–5.3)
Sodium: 138 mmol/L (ref 135–146)
Total Bilirubin: 0.5 mg/dL (ref 0.2–1.2)
Total Protein: 7.2 g/dL (ref 6.1–8.1)
eGFR: 95 mL/min/{1.73_m2} (ref >=60)

## 2022-09-12 LAB — LIPID PANEL
Cholesterol: 297 mg/dL — ABNORMAL HIGH (ref <200)
HDL: 65 mg/dL (ref >=50)
LDL Cholesterol (Calc): 199 mg/dL (calc) — ABNORMAL HIGH
Non-HDL Cholesterol (Calc): 232 mg/dL (calc) — ABNORMAL HIGH (ref <130)
Total CHOL/HDL Ratio: 4.6 (calc) (ref <5.0)
Triglycerides: 166 mg/dL — ABNORMAL HIGH (ref <150)

## 2022-09-12 LAB — CBC WITH DIFFERENTIAL/PLATELET
Absolute Monocytes: 535 cells/uL (ref 200–950)
Basophils Absolute: 64 cells/uL (ref 0–200)
Basophils Relative: 0.6 %
Eosinophils Absolute: 161 cells/uL (ref 15–500)
Eosinophils Relative: 1.5 %
HCT: 43.6 % (ref 35.0–45.0)
Hemoglobin: 14.1 g/dL (ref 11.7–15.5)
Lymphs Abs: 5500 cells/uL — ABNORMAL HIGH (ref 850–3900)
MCH: 27.4 pg (ref 27.0–33.0)
MCHC: 32.3 g/dL (ref 32.0–36.0)
MCV: 84.8 fL (ref 80.0–100.0)
MPV: 10.3 fL (ref 7.5–12.5)
Monocytes Relative: 5 %
Neutro Abs: 4441 cells/uL (ref 1500–7800)
Neutrophils Relative %: 41.5 %
Platelets: 360 10*3/uL (ref 140–400)
RBC: 5.14 10*6/uL — ABNORMAL HIGH (ref 3.80–5.10)
RDW: 13 % (ref 11.0–15.0)
Total Lymphocyte: 51.4 %
WBC: 10.7 10*3/uL (ref 3.8–10.8)

## 2022-09-12 LAB — INSULIN, RANDOM: Insulin: 22.6 u[IU]/mL — ABNORMAL HIGH

## 2022-09-12 LAB — VITAMIN D 25 HYDROXY (VIT D DEFICIENCY, FRACTURES): Vit D, 25-Hydroxy: 64 ng/mL (ref 30–100)

## 2022-09-12 LAB — TSH: TSH: 59.34 mIU/L — ABNORMAL HIGH (ref 0.40–4.50)

## 2022-09-12 LAB — MAGNESIUM: Magnesium: 2.1 mg/dL (ref 1.5–2.5)

## 2022-09-12 NOTE — Progress Notes (Signed)
<><><><><><><><><><><><><><><><><><><><><><><><><><><><><><><><><> <><><><><><><><><><><><><><><><><><><><><><><><><><><><><><><><><>  - Very elevated TSH suggests that you are not taking your thyroid medication !  You must be ABSOLUTELY certain that                                                 you are NOT . Marland Kitchen .  and  I Repeat  NOT. . . . .                                                                            Taking any supplements with Biotin in it    ( Cause it can kill you by messing up your Thyroid measurements ! )   - Recommend that you start back taking                                                                 - Levothyroxine 50 mcg  Daily  - Very important how you take it  !   Take  1st thing in the Morning          on an empty stomach                                                                            withONLYwater for 30 minutes &                                                  no Antacid meds, Calcium or Magnesium for 4 hours                                                                                & avoid Biotin  - Then recommend schedule a lNurse visit in 2 weeks to recheck TSH  ( Thyroid )   <><><><><><><><><><><><><><><><><><><><><><><><><><><><><><><><><> <><><><><><><><><><><><><><><><><><><><><><><><><><><><><><><><><>  -  Chol = 297 is Horrible -           probably because Thyroid is so out of  "Sync"   - LDL Chol = 199  - is a "timebomb "  just waiting to cause a massive heart attack or stroke  ==================================================================  - Diet is also very important    - Recommend a stricter low cholesterol diet   - Cholesterol only comes from animal sources                                                                        - ie. meat, dairy, egg yolks  - Eat all the vegetables you want.  - Avoid Meat, Avoid Meat,  Avoid Meat                                                        - especially Red Meat - Beef AND Pork .  - Avoid cheese & dairy - milk & ice cream.     - Cheese is the most concentrated form of trans-fats which                                                      is the worst thing to clog up our arteries.   - Veggie cheese is OK which can be found in the fresh produce section at                                                                  [employer] or [employer] or [employer] <><><><><><><><><><><><><><><><><><><><><><><><><><><><><><><><><> <><><><><><><><><><><><><><><><><><><><><><><><><><><><><><><><><>  - A1c = 6.1% still too high  - Goal is less than 5.7%     Being diabetic has a  300% increased risk for heart attack,                                                      stroke, cancer, and alzheimer- type vascular dementia.   It is very important that you work harder with diet by                                      avoiding all foods that are white except chicken, fish & calliflower.  - Avoid white rice  (brown & wild rice is OK),   - Avoid white potatoes  (sweet potatoes in moderation is OK),   White bread or wheat bread or anything made out of   white flour like bagels, donuts, rolls, buns, biscuits, cakes,  - pastries, cookies, pizza crust, and pasta (made from  white flour & egg whites)   - vegetarian pasta or spinach or wheat pasta is OK.  -  Multigrain breads like Arnold's, [employer] or   multigrain sandwich thins or high fiber breads like   Eureka bread or "Dave[employer] Killer" breads that are  4 to 5 grams fiber per slice !  are best.    Diet, exercise and weight loss can reverse and cure diabetes in the early stages.    - Diet, exercise and weight loss is very important in the   control and prevention of complications of diabetes which   affects every system in your body, ie.   -Brain - dementia/stroke,  - eyes - glaucoma/blindness,  - heart - heart attack/heart failure,  -  kidneys - dialysis,  - stomach - gastric paralysis,  - intestines - malabsorption,  - nerves - severe painful neuritis,  - circulation - gangrene & loss of a leg(s)  - and finally  . . . . . . . . . . . . . . . . . .    - cancer and Alzheimers. <><><><><><><><><><><><><><><><><><><><><><><><><><><><><><><><><> <><><><><><><><><><><><><><><><><><><><><><><><><><><><><><><><><>  - Vitamin D = 64 - Great   -  Please Keep dose the same  <><><><><><><><><><><><><><><><><><><><><><><><><><><><><><><><><> <><><><><><><><><><><><><><><><><><><><><><><><><><><><><><><><><>

## 2022-09-20 DIAGNOSIS — E119 Type 2 diabetes mellitus without complications: Secondary | ICD-10-CM | POA: Diagnosis not present

## 2022-09-20 LAB — HM DIABETES EYE EXAM

## 2022-09-22 ENCOUNTER — Ambulatory Visit: Payer: Medicare Other | Admitting: Podiatry

## 2022-09-22 ENCOUNTER — Encounter: Payer: Self-pay | Admitting: Podiatry

## 2022-09-22 DIAGNOSIS — M7671 Peroneal tendinitis, right leg: Secondary | ICD-10-CM | POA: Diagnosis not present

## 2022-09-22 MED ORDER — TRIAMCINOLONE ACETONIDE 10 MG/ML IJ SUSP
10.0000 mg | Freq: Once | INTRAMUSCULAR | Status: AC
  Administered 2022-09-22: 10 mg

## 2022-09-23 NOTE — Progress Notes (Signed)
Subjective:   Patient ID: Desiree Anderson, female   DOB: 66 y.o.   MRN: [id number]   HPI Patient presents stating that the right lateral foot has become sore again but she feels like she did this to herself with a certain shoe and she was doing well up to that point   ROS      Objective:  Physical Exam  Neurovascular status intact with inflammation base of fifth metatarsal right at the insertion of the peroneal tendon into the bone structure     Assessment:  Peroneal tendinitis right with inflammation     Plan:  Reviewed condition and recommended at this point that we just go ahead and do 1 more injection I did discuss the risk of this and chances for rupture and the fact that we Anderson need to get an MRI if symptoms do not improve.  Patient wants this done I went ahead did sterile prep I injected around this area carefully into the sheath 3 mg Dexasone Kenalog 5 mg Xylocaine.[email or web address]

## 2022-09-24 NOTE — Progress Notes (Signed)
Future Appointments  Date Time Provider Department  09/25/2022 11:30 AM Unk Pinto, MD [facility]  12/13/2022 11:30 AM Alycia Rossetti, NP [facility]  03/28/2023 10:00 AM Unk Pinto, MD [facility]    History of Present Illness:      This very nice 66 y.o.  MWF with HTN, HLD, Pre-Diabetes, Hypothyroidism, Hx/o Breast cancer and Vitamin D Deficiency  presents who presents for f/u & concern re : a recurrent non-healing superficial  " raw area" just lateral to her Rt eye.    Medications    levothyroxine  100 MCG tablet, Take  1 tablet  Daily     metFORMIN (GLUCOPHAGE-XR) 500 MG 24 hr tablet, Take 2 tablets 2 x/ day with meals  for Diabetes   triamcinolone acetonide (KENALOG) 10 MG/ML injection 10 mg    cholestyramine (QUESTRAN) 4 g packet, Take 1 packet  2 (two) times daily.   ezetimibe (ZETIA) 10 MG tablet, Take  1 tablet  Daily     olmesartan (BENICAR) 40 MG tablet, Take  1 /2 to 1 tablet  Daily     rosuvastatin (CRESTOR) 10 MG tablet, Take  1 tablet  Daily   cetirizine (ZYRTEC) 10 MG tablet, Take 10 mg by mouth daily as needed for allergies.   fluticasone (FLONASE) 50 MCG/ACT nasal spray, USE TWO SPRAY(S) IN EACH NOSTRIL ONCE DAILY AS NEEDED   rizatriptan (MAXALT) 10 MG tablet, Take 10 mg by mouth as needed for migraine. May repeat in 2 hours if needed    ALPRAZolam (XANAX) 0.5 MG tablet, TAKE 1/2 TO 1 TABLET BY MOUTH 2-3 TIMES DAILY ONLY IF NEEDED FOR ANXIETY ATTACK   Ascorbic Acid (VITAMIN C) 1000 MG tablet, Take daily.   Cholecalciferol (VITAMIN D-3) 5000 UNITS TABS, Take 5,000 Units by mouth 2 (two) times daily.    famotidine (PEPCID) 40 MG tablet, Take 40 mg  daily as needed for heartburn or indigestion.   gabapentin (NEURONTIN) 600 MG tablet, Take  1 tablet   3 x /day  for Chronic Pain          hyoscyamine (LEVSIN SL) 0.125 MG SL tablet, DISSOLVE 1 TABLET IN MOUTH EVERY 6 HOURS AS NEEDED   meclizine (ANTIVERT) 25 MG tablet, TAKE ONE-HALF TO ONE TABLET  UP TO  THREE TIMES DAILY FOR MOTION SICKNESS/DIZZINESS   methocarbamol (ROBAXIN) 500 MG tablet, Take 1 tablet  every 6 (six) hours as needed for muscle spasms.   phentermine  37.5 MG tablet, Take  1 tablet  every Morning  for Dieting & Weight Loss   potassium chloride SA (KLOR-CON M) 20 MEQ tablet, TAKE 1 TABLET TWICE DAILY FOR  POTASSIUM   topiramate (TOPAMAX) 50 MG tablet, Take  1 tablet  2 x /day     venlafaxine XR (EFFEXOR-XR) 37.5 MG 24 hr capsule, Take 1 capsule  daily with breakfast.   zinc  50 MG tablet, Take  daily.   Problem list She has Essential hypertension; Mixed hyperlipidemia; Prediabetes; Vitamin D deficiency; DJD ; DDD, lumbar; Fibromyalgia Syndrome; Cystocele, midline; Obesity (BMI 30.0-34.9); Hypothyroidism; Malignant neoplasm of upper-outer quadrant of right breast in female, estrogen receptor positive ([place]); Tobacco chew use; Insomnia; Situational anxiety; Other spondylosis with radiculopathy, cervical region; Spinal stenosis of cervical region; and Cervical spinal stenosis on their problem list.   Observations/Objective:  BP 124/84   Pulse 90   Temp 97.8 F (36.6 C)   Resp 16   Ht '5\' 2"'$  (1.575 m)   Wt 159 lb (72.1 kg)  SpO2 98%   BMI 29.08 kg/m   HEENT - WNL. Neck - supple.  Skin - There is a 3-4  x 5 mm superficial "raw" area just lateral to Rt eye    Procedure   ( CPT:  73532 )       After informed consent  & aseptic prep with alcohol , the above lesion was anesthetized locally with 0.75 ml Marcaine 0.5%  Then with a #10 scalpel the lesion was excised full thickness in an elliptical fashion  & sent for path analysis.  Then the opposing wound edges were  aligned & approximated  and everted & secured with #5 interrupted sutures of Nylon 5-0. Then antibiotic ointment was applied & covered with a sterile bandage. Patient was instructed in post-op pound care  and advised return in 5 days to recheck.    Assessment and Plan:   1. Essential hypertension   2. Solar  keratosis  -  Biopsy for Surgical pathology  Follow Up Instructions:        I discussed the assessment and treatment plan with the patient. The patient was provided an opportunity to ask questions and all were answered. The patient agreed with the plan and demonstrated an understanding of the instructions.       The patient was advised to return in 4-5 days for re-check & /or suture removal .   Kirtland Bouchard, MD

## 2022-09-25 ENCOUNTER — Ambulatory Visit (INDEPENDENT_AMBULATORY_CARE_PROVIDER_SITE_OTHER): Payer: Medicare Other | Admitting: Internal Medicine

## 2022-09-25 ENCOUNTER — Encounter: Payer: Self-pay | Admitting: Internal Medicine

## 2022-09-25 ENCOUNTER — Other Ambulatory Visit: Payer: Self-pay | Admitting: Internal Medicine

## 2022-09-25 VITALS — BP 124/84 | HR 90 | Temp 97.8°F | Resp 16 | Ht 62.0 in | Wt 159.0 lb

## 2022-09-25 DIAGNOSIS — D485 Neoplasm of uncertain behavior of skin: Secondary | ICD-10-CM | POA: Diagnosis not present

## 2022-09-25 DIAGNOSIS — L570 Actinic keratosis: Secondary | ICD-10-CM

## 2022-09-25 DIAGNOSIS — I10 Essential (primary) hypertension: Secondary | ICD-10-CM

## 2022-09-28 NOTE — Progress Notes (Signed)
Future Appointments  Date Time Provider Department  09/29/2022 11:45 AM Unk Pinto, MD [facility]  10/04/2022  1:20 PM [facility]  12/13/2022 11:30 AM Alycia Rossetti, NP [facility]  03/28/2023 10:00 AM Unk Pinto, MD [facility]    History of Present Illness:  Patient returns 4 days s/p excision of a lesion of the Rt cheek lateral to eye.  Path still pending. Patient is also c/o dysuria and cloudy urine. No fever, chills, N/V .    Medications  Current Outpatient Medications (Endocrine & Metabolic):    levothyroxine (SYNTHROID) 100 MCG tablet, Take  1 tablet  Daily  on an empty stomach with only water  for 30 minutes & no Antacid meds, Calcium or Magnesium for 4 hours & avoid Biotin   metFORMIN (GLUCOPHAGE-XR) 500 MG 24 hr tablet, Take 2 tablets 2 x/ day with meals  for Diabetes  Current Facility-Administered Medications (Endocrine & Metabolic):    triamcinolone acetonide (KENALOG) 10 MG/ML injection 10 mg  Current Outpatient Medications (Cardiovascular):    cholestyramine (QUESTRAN) 4 g packet, Take 1 packet (4 g total) by mouth 2 (two) times daily.   ezetimibe (ZETIA) 10 MG tablet, Take  1 tablet  Daily  for Cholesterol                                              /                    TAKE                        BY                  MOUTH                                  ONCE DAILY   olmesartan (BENICAR) 40 MG tablet, Take  1 /2 to 1 tablet  Daily  for BP (Patient taking differently: Take 20 mg by mouth daily.)   rosuvastatin (CRESTOR) 10 MG tablet, Take  1 tablet  Daily for Cholesterol                                           /                        TAKE                                    BY                        MOUTH                          ONCE DAILY   Current Outpatient Medications (Respiratory):    cetirizine (ZYRTEC) 10 MG tablet, Take 10 mg by mouth daily as needed for allergies.   fluticasone (FLONASE) 50 MCG/ACT nasal spray, USE TWO SPRAY(S) IN  EACH NOSTRIL ONCE DAILY AS NEEDED   Current Outpatient  Medications (Analgesics):    rizatriptan (MAXALT) 10 MG tablet, Take 10 mg by mouth as needed for migraine. May repeat in 2 hours if needed     Current Outpatient Medications (Other):    ALPRAZolam (XANAX) 0.5 MG tablet, TAKE 1/2 TO 1 TABLET BY MOUTH 2-3 TIMES DAILY ONLY IF NEEDED FOR ANXIETY ATTACH AND LIMIT TO 5 DAYS A WEEK TO AVOID ADDICTION AND DEMENTIA   Ascorbic Acid (VITAMIN C) 1000 MG tablet, Take 1,000 mg by mouth daily.   Blood Glucose Monitoring Suppl w/Device KIT, Test blood sugar once daily or as directed.   Cholecalciferol (VITAMIN D-3) 5000 UNITS TABS, Take 5,000 Units by mouth 2 (two) times daily.    famotidine (PEPCID) 40 MG tablet, Take 40 mg by mouth daily as needed for heartburn or indigestion.   gabapentin (NEURONTIN) 600 MG tablet, Take  1 tablet   3 x /day  for Chronic Pain                                       /                         TAKE                              BY                  MOUTH   glucose blood (FREESTYLE LITE) test strip, Test blood sugar once daily or as directed.   hyoscyamine (LEVSIN SL) 0.125 MG SL tablet, DISSOLVE 1 TABLET IN MOUTH EVERY 6 HOURS AS NEEDED   Lancets MISC, Test blood sugar once daily or as directed.   meclizine (ANTIVERT) 25 MG tablet, TAKE ONE-HALF TO ONE TABLET BY MOUTH UP TO THREE TIMES DAILY FOR MOTION SICKNESS/DIZZINESS   methocarbamol (ROBAXIN) 500 MG tablet, Take 1 tablet (500 mg total) by mouth every 6 (six) hours as needed for muscle spasms.   phentermine (ADIPEX-P) 37.5 MG tablet, Take  1 tablet  every Morning  for Dieting & Weight Loss   potassium chloride SA (KLOR-CON M) 20 MEQ tablet, TAKE 1 TABLET BY MOUTH TWICE DAILY FOR  POTASSIUM   topiramate (TOPAMAX) 50 MG tablet, Take  1 tablet  2 x /day  at Suppertime & Bedtime for Dieting & Weight loss                                               /                     TAKE 1/2 TO 1 TABLET BY MOUTH   venlafaxine XR  (EFFEXOR-XR) 37.5 MG 24 hr capsule, Take 1 capsule (37.5 mg total) by mouth daily with breakfast.   zinc gluconate 50 MG tablet, Take 50 mg by mouth daily.   Problem list She has Essential hypertension; Mixed hyperlipidemia; Prediabetes; Vitamin D deficiency; DJD ; DDD, lumbar; Fibromyalgia Syndrome; Cystocele, midline; Obesity (BMI 30.0-34.9); Hypothyroidism; Malignant neoplasm of upper-outer quadrant of right breast in female, estrogen receptor positive ([place]); Tobacco chew use; Insomnia; Situational anxiety; Other spondylosis with radiculopathy, cervical region; Spinal stenosis of cervical region; and Cervical spinal stenosis on their  problem list.   Observations/Objective:  BP 130/78   Pulse 86   Temp 97.8 F (36.6 C)   Resp 16   Ht _0  (1.575 m)   Wt 164 lb 6.4 oz (74.6 kg)   SpO2 98%   BMI 30.07 kg/m   Wound Rt cheek appears well healed w/o signs of infection .  A sutures removed & steri strips applied.    Assessment and Plan:      Follow Up Instructions:        I discussed the assessment and treatment plan with the patient. The patient was provided an opportunity to ask questions and all were answered. The patient agreed with the plan and demonstrated an understanding of the instructions.       The patient was advised to call back or seek an in-person evaluation if the symptoms worsen or if the condition fails to improve as anticipated.    Kirtland Bouchard, MD] \

## 2022-09-29 ENCOUNTER — Encounter: Payer: Self-pay | Admitting: Internal Medicine

## 2022-09-29 ENCOUNTER — Ambulatory Visit: Payer: Medicare Other | Admitting: Internal Medicine

## 2022-09-29 VITALS — BP 130/78 | HR 86 | Temp 97.8°F | Resp 16 | Ht 62.0 in | Wt 164.4 lb

## 2022-09-29 DIAGNOSIS — R300 Dysuria: Secondary | ICD-10-CM | POA: Diagnosis not present

## 2022-09-29 DIAGNOSIS — D485 Neoplasm of uncertain behavior of skin: Secondary | ICD-10-CM

## 2022-09-29 MED ORDER — NITROFURANTOIN MONOHYD MACRO 100 MG PO CAPS: ORAL_CAPSULE | ORAL | 0 refills | Status: DC

## 2022-09-29 NOTE — Progress Notes (Signed)
<><><><><><><><><><><><><><><><><><><><><><><><><><><><><><><><><> <><><><><><><><><><><><><><><><><><><><><><><><><><><><><><><><><>  -   Path report finally returned Fri nite &                           showed  the skin lesion was an actinic or solar  keratosis  - this means it was not a skin cancer (yet),                                    but is referred to as a "pre shin cancer",  So  It's a good thing that is was removed !    <><><><><><><><><><><><><><><><><><><><><><><><><><><><><><><><><> <><><><><><><><><><><><><><><><><><><><><><><><><><><><><><><><><>

## 2022-09-30 LAB — URINALYSIS, ROUTINE W REFLEX MICROSCOPIC
Bilirubin Urine: NEGATIVE
Glucose, UA: NEGATIVE
Hgb urine dipstick: NEGATIVE
Ketones, ur: NEGATIVE
Leukocytes,Ua: NEGATIVE
Nitrite: NEGATIVE
Protein, ur: NEGATIVE
Specific Gravity, Urine: 1.01 (ref 1.001–1.035)
pH: 5.5 (ref 5.0–8.0)

## 2022-09-30 LAB — URINE CULTURE
MICRO NUMBER:: 14142399
SPECIMEN QUALITY:: ADEQUATE

## 2022-10-01 NOTE — Progress Notes (Signed)
<><><><><><><><><><><><><><><><><><><><><><><><><><><><><><><><><> <><><><><><><><><><><><><><><><><><><><><><><><><><><><><><><><><>  -   U/C returned OK  so not a bacterial infection & should stop the Antibiotic  - Dysuria or burning with urination could be due to a yeast infection  <><><><><><><><><><><><><><><><><><><><><><><><><><><><><><><><><> <><><><><><><><><><><><><><><><><><><><><><><><><><><><><><><><><>

## 2022-10-03 ENCOUNTER — Ambulatory Visit (INDEPENDENT_AMBULATORY_CARE_PROVIDER_SITE_OTHER): Payer: Medicare Other

## 2022-10-03 DIAGNOSIS — E039 Hypothyroidism, unspecified: Secondary | ICD-10-CM | POA: Diagnosis not present

## 2022-10-03 NOTE — Progress Notes (Signed)
Patient here for a Nurse visit to recheck her TSH. She is taking her Levothyroxine 16mg daily.

## 2022-10-04 ENCOUNTER — Ambulatory Visit
Admission: RE | Admit: 2022-10-04 | Discharge: 2022-10-04 | Disposition: A | Discharge status: Home / Self Care | Payer: Medicare Other | Source: Ambulatory Visit | Attending: Internal Medicine | Admitting: Internal Medicine

## 2022-10-04 DIAGNOSIS — Z1231 Encounter for screening mammogram for malignant neoplasm of breast: Secondary | ICD-10-CM

## 2022-10-04 LAB — TSH: TSH: 32.29 mIU/L — ABNORMAL HIGH (ref 0.40–4.50)

## 2022-10-04 NOTE — Progress Notes (Signed)
<><><><><><><><><><><><><><><><><><><><><><><><><><><><><><><><><> <><><><><><><><><><><><><><><><><><><><><><><><><><><><><><><><><>  -   Thyroid hormone level still looks very Low  !       So Recommend that   - Restart back taking 1 whole tablet  ( 100 mcg) every day = 7 tablets /week.   - recommend schedule a Nurse Visit in 3 weeks to recheck TSH  Level    <><><><><><><><><><><><><><><><><><><><><><><><><><><><><><><><><> <><><><><><><><><><><><><><><><><><><><><><><><><><><><><><><><><>  -

## 2022-10-25 ENCOUNTER — Ambulatory Visit (INDEPENDENT_AMBULATORY_CARE_PROVIDER_SITE_OTHER): Payer: Medicare Other

## 2022-10-25 ENCOUNTER — Other Ambulatory Visit: Payer: Self-pay | Admitting: Nurse Practitioner

## 2022-10-25 VITALS — BP 122/88 | HR 75 | Temp 97.3°F | Ht 62.0 in | Wt 164.2 lb

## 2022-10-25 DIAGNOSIS — F419 Anxiety disorder, unspecified: Secondary | ICD-10-CM

## 2022-10-25 DIAGNOSIS — E039 Hypothyroidism, unspecified: Secondary | ICD-10-CM | POA: Diagnosis not present

## 2022-10-25 MED ORDER — ALPRAZOLAM 0.5 MG PO TABS: ORAL_TABLET | ORAL | 0 refills | Status: DC

## 2022-10-25 NOTE — Progress Notes (Signed)
PDMP is reviewed and appropriate  

## 2022-10-25 NOTE — Progress Notes (Signed)
Patient presents to the office for a nurse visit to recheck TSH levels. Patient states that she is taking 17mg, 1 tablet daily.

## 2022-10-26 LAB — TSH: TSH: 3.19 mIU/L (ref 0.40–4.50)

## 2022-10-26 NOTE — Progress Notes (Signed)
<><><><><><><><><><><><><><><><><><><><><><><><><><><><><><><><><> <><><><><><><><><><><><><><><><><><><><><><><><><><><><><><><><><>  -   TSH is "PERFECT"  - Please keep dose SAME   <><><><><><><><><><><><><><><><><><><><><><><><><><><><><><><><><> <><><><><><><><><><><><><><><><><><><><><><><><><><><><><><><><><>

## 2022-10-30 ENCOUNTER — Other Ambulatory Visit: Payer: Self-pay | Admitting: Nurse Practitioner

## 2022-11-22 ENCOUNTER — Ambulatory Visit: Payer: Medicare Other | Admitting: Nurse Practitioner

## 2022-12-11 ENCOUNTER — Other Ambulatory Visit: Payer: Self-pay | Admitting: Nurse Practitioner

## 2022-12-11 DIAGNOSIS — F419 Anxiety disorder, unspecified: Secondary | ICD-10-CM

## 2022-12-11 MED ORDER — ALPRAZOLAM 0.5 MG PO TABS: ORAL_TABLET | ORAL | 0 refills | Status: DC

## 2022-12-12 NOTE — Progress Notes (Addendum)
MEDICARE ANNUAL WELLNESS VISIT AND FOLLOW UP  Assessment:   Desiree Anderson was seen today for medicare wellness.  Diagnoses and all orders for this visit: Encounter for medicare annual wellness visit Due yearly  Hypothyroidism, unspecified type Please take your thyroid medication greater than 30 min before breakfast, separated by at least 4 hours  from antacids, calcium, iron, and multivitamins. -     TSH  CKD stage 2 Increase fluids, avoid NSAIDS, monitor sugars, will monitor  Anxiety Currently uses Alprazolam 0.5 mg rarely Continue relaxation techniques  Essential hypertension -  She stopped her Olmesartan as BP has been running WNL without the medication - continue DASH diet, exercise and monitor at home. Call if greater than 130/80.  -     CBC with Differential/Platelet  Abnormal glucose Continue medication, diet and exercise -     CBC with Differential/Platelet -     COMPLETE METABOLIC PANEL WITH GFR  Malignant neoplasm of upper/outer quadrant of right breast, estrogen receptor positive(HCC) - Continue to follow with oncology  Vitamin D deficiency Continue Vit D supplementation to maintain value in therapeutic level of 60-100   Hyperlipidemia, mixed Continue Rosuvastatin, diet and exercise -     COMPLETE METABOLIC PANEL WITH GFR -     Lipid panel  Gastroesophageal reflux disease without esophagitis Continue Pepcid and dietary modifications -     Magnesium  Medication management -     CBC with Differential/Platelet -     COMPLETE METABOLIC PANEL WITH GFR -     Lipid panel -     TSH -     Magnesium  Obesity (BMI 30.0-34.9) Long discussion about weight loss, diet, and exercise Recommended diet heavy in fruits and veggies and low in animal meats, cheeses, and dairy products, appropriate calorie intake Patient will work on decreasing saturated fats, simple cabs.  Increase protein and activity Follow up at next visit  Fibromyalgia Syndrome Continue  Gabapentin Monitor symptoms  Hypokalemia -     COMPLETE METABOLIC PANEL WITH GFR  Diverticulitis/ Left lower quadrant abdominal pain Liquid diet and monitor symptoms Stat CT ordered If develops worsening of pain, fever or blood in stool she is to go to the ER -     cholestyramine (QUESTRAN) 4 g packet; Take 1 packet (4 g total) by mouth 2 (two) times daily. -     metroNIDAZOLE (FLAGYL) 500 MG tablet; Take 1 tablet (500 mg total) by mouth 2 (two) times daily for 14 days. 14 days -     ciprofloxacin (CIPRO) 500 MG tablet; Take 1 tablet (500 mg total) by mouth 2 (two) times daily for 14 days. -     ondansetron (ZOFRAN) 4 MG tablet; Take 1 tablet (4 mg total) by mouth daily as needed for nausea or vomiting. -     CT Abdomen Pelvis W Contrast; Future    Over 40 minutes of exam, counseling, chart review and critical decision making was performed Future Appointments  Date Time Provider [facility]  03/28/2023 10:00 AM Unk Pinto, MD [facility] None  12/18/2023 11:00 AM Alycia Rossetti, NP [facility] None     Plan:   During the course of the visit the patient was educated and counseled about appropriate screening and preventive services including:   Pneumococcal vaccine  Prevnar 13 Influenza vaccine Td vaccine Screening electrocardiogram Bone densitometry screening Colorectal cancer screening Diabetes screening Glaucoma screening Nutrition counseling  Advanced directives: requested   Subjective:  Desiree Anderson is a 67 y.o. female who presents  for Medicare Annual Wellness Visit and 3 month follow up.   She does have a history of diverticulosis and symptoms returned 3 weeks ago.  Had a few Vicodin that did not help pain.  Has tried Levsin with no relief. She continues to have increased pain in LLQ, nausea and chills.  She has not been using a clear liquid diet.  Denies blood in stool   Her blood pressure has been controlled at home, today their BP is BP: 128/82  BP  Readings from Last 3 Encounters:  12/13/22 128/82  10/25/22 122/88  10/03/22 (!) 140/82  She does not workout. She denies chest pain, shortness of breath, dizziness.   BMI is Body mass index is 30.54 kg/m., she has not been working on diet and exercise. Wt Readings from Last 3 Encounters:  12/13/22 167 lb (75.8 kg)  10/25/22 164 lb 3.2 oz (74.5 kg)  10/03/22 162 lb 3.2 oz (73.6 kg)     She is on cholesterol medication and denies myalgias. Her cholesterol is not at goal. The cholesterol last visit was:   Lab Results  Component Value Date   CHOL 297 (H) 09/11/2022   HDL 65 09/11/2022   LDLCALC 199 (H) 09/11/2022   TRIG 166 (H) 09/11/2022   CHOLHDL 4.6 09/11/2022    She has been working on diet and exercise for abnormal glucose, She is currently taking Metformin 500 mg 2 tabs BID. Has not been checking her blood sugars:  Last A1C in the office was:  Lab Results  Component Value Date   HGBA1C 6.1 (H) 09/11/2022   Last GFR:  Lab Results  Component Value Date   EGFR 95 09/11/2022    Patient is on Vitamin D supplement.   Lab Results  Component Value Date   VD25OH 64 09/11/2022      Medication Review: Current Outpatient Medications on File Prior to Visit  Medication Sig Dispense Refill   ALPRAZolam (XANAX) 0.5 MG tablet TAKE 1/2 TO 1 TABLET BY MOUTH 2-3 TIMES DAILY ONLY IF NEEDED FOR ANXIETY ATTACH AND LIMIT TO 5 DAYS A WEEK TO AVOID ADDICTION AND DEMENTIA 30 tablet 0   Ascorbic Acid (VITAMIN C) 1000 MG tablet Take 1,000 mg by mouth daily.     Blood Glucose Monitoring Suppl w/Device KIT Test blood sugar once daily or as directed. 1 kit 0   cetirizine (ZYRTEC) 10 MG tablet Take 10 mg by mouth daily as needed for allergies.     Cholecalciferol (VITAMIN D-3) 5000 UNITS TABS Take 5,000 Units by mouth 2 (two) times daily.      ezetimibe (ZETIA) 10 MG tablet Take  1 tablet  Daily  for Cholesterol                                              /                    TAKE                         BY                  MOUTH  ONCE DAILY 90 tablet 3   famotidine (PEPCID) 40 MG tablet Take 40 mg by mouth daily as needed for heartburn or indigestion.     fluticasone (FLONASE) 50 MCG/ACT nasal spray USE TWO SPRAY(S) IN EACH NOSTRIL ONCE DAILY AS NEEDED 48 g 3   gabapentin (NEURONTIN) 600 MG tablet Take  1 tablet   3 x /day  for Chronic Pain                                       /                         TAKE                              BY                  MOUTH 270 tablet 3   glucose blood (FREESTYLE LITE) test strip Test blood sugar once daily or as directed. 100 each 12   hyoscyamine (LEVSIN SL) 0.125 MG SL tablet DISSOLVE 1 TABLET IN MOUTH EVERY 6 HOURS AS NEEDED 60 tablet 0   Lancets MISC Test blood sugar once daily or as directed. 200 each 11   levothyroxine (SYNTHROID) 100 MCG tablet Take  1 tablet  Daily  on an empty stomach with only water  for 30 minutes & no Antacid meds, Calcium or Magnesium for 4 hours & avoid Biotin 90 tablet 1   meclizine (ANTIVERT) 25 MG tablet TAKE ONE-HALF TO ONE TABLET BY MOUTH UP TO THREE TIMES DAILY FOR MOTION SICKNESS/DIZZINESS 30 tablet 0   metFORMIN (GLUCOPHAGE-XR) 500 MG 24 hr tablet Take 2 tablets 2 x/ day with meals  for Diabetes 360 tablet 3   methocarbamol (ROBAXIN) 500 MG tablet Take 1 tablet (500 mg total) by mouth every 6 (six) hours as needed for muscle spasms. 60 tablet 0   potassium chloride SA (KLOR-CON M) 20 MEQ tablet Take  1 tablet  2 x /day  for Potassium                                                  /                                                           TAKE                                             BY                                              MOUTH 180 tablet 3   rizatriptan (MAXALT) 10 MG tablet Take 10 mg by mouth as needed for migraine. May repeat in 2 hours if  needed     rosuvastatin (CRESTOR) 10 MG tablet Take  1 tablet  Daily for Cholesterol                                           /                         TAKE                                    BY                        MOUTH                          ONCE DAILY 90 tablet 3   topiramate (TOPAMAX) 50 MG tablet Take  1 tablet  2 x /day  at Suppertime & Bedtime for Dieting & Weight loss                                               /                     TAKE 1/2 TO 1 TABLET BY MOUTH 180 tablet 3   venlafaxine XR (EFFEXOR-XR) 37.5 MG 24 hr capsule Take 1 capsule (37.5 mg total) by mouth daily with breakfast. 90 capsule 4   olmesartan (BENICAR) 40 MG tablet Take  1 /2 to 1 tablet  Daily  for BP (Patient not taking: Reported on 12/13/2022) 90 tablet 3   phentermine (ADIPEX-P) 37.5 MG tablet Take  1 tablet  every Morning  for Dieting & Weight Loss (Patient not taking: Reported on 12/13/2022) 90 tablet 1   zinc gluconate 50 MG tablet Take 50 mg by mouth daily.     No current facility-administered medications on file prior to visit.    Allergies  Allergen Reactions   Lipitor [Atorvastatin] Other (See Comments)    Muscle and legs aches   Shellfish Allergy Diarrhea and Nausea And Vomiting   Betadine [Povidone Iodine] Rash   Iodine     Pt unsure of reaction     Current Problems (verified) Patient Active Problem List   Diagnosis Date Noted   Cervical spinal stenosis 09/16/2021   Spinal stenosis of cervical region 07/04/2021   Other spondylosis with radiculopathy, cervical region 03/29/2021   Insomnia 03/25/2018   Situational anxiety 03/25/2018   Tobacco chew use 05/15/2016   Malignant neoplasm of upper-outer quadrant of right breast in female, estrogen receptor positive ([place]) 04/21/2016   Hypothyroidism 04/18/2016   Obesity (BMI 30.0-34.9) 10/11/2015   Cystocele, midline 03/23/2014   Essential hypertension 12/10/2013   Mixed hyperlipidemia 12/10/2013   Prediabetes 12/10/2013   Vitamin D deficiency 12/10/2013   DJD  12/10/2013   DDD, lumbar 12/10/2013   Fibromyalgia Syndrome 12/10/2013    Screening Tests Immunization  History  Administered Date(s) Administered   Influenza Inj Mdck Quad With Preservative 09/04/2017, 09/30/2018, 09/15/2020   Influenza Split 09/18/2013, 09/25/2014, 10/21/2019   Influenza, Seasonal, Injecte, Preservative Fre 10/11/2015   Influenza,inj,Quad PF,6+ Mos 08/31/2021, 09/11/2022  Influenza,inj,quad, With Preservative 08/28/2016, 08/27/2017   Influenza-Unspecified 08/27/2016   PFIZER(Purple Top)SARS-COV-2 Vaccination 02/20/2020, 03/12/2020   Pneumococcal-Unspecified 11/27/2000   Tdap 03/16/2011   Health Maintenance  Topic Date Due   Pneumonia Vaccine 25+ Years old (1 - PCV) 11/30/2022   PAP SMEAR-Modifier  12/13/2022 (Originally 04/11/2019)   COVID-19 Vaccine (3 - Pfizer risk series) 12/29/2022 (Originally 04/09/2020)   Zoster Vaccines- Shingrix (1 of 2) 03/14/2023 (Originally [birth year]/30/1976)   DEXA SCAN  12/14/2023 (Originally 11/30/2022)   HIV Screening  12/14/2023 (Originally [birth year]/1972)   MAMMOGRAM  08/06/2023   Medicare Annual Wellness (AWV)  12/14/2023   INFLUENZA VACCINE  Completed   HPV VACCINES  Aged Out   DTaP/Tdap/Td  Discontinued   Hepatitis C Screening  Discontinued   Fecal DNA (Cologuard)  Discontinued     Names of Other Physician/Practitioners you currently use: 1. [facility] here for primary care 2. Dr. Brigitte Pulse, eye doctor, last visit 09/2022 3. Dr Jiles Garter, dentist, last visit 04/2022 Patient Care Team: Unk Pinto, MD as PCP - General (Internal Medicine) Fay Records, MD as Consulting Physician (Cardiology) [name], MD as Consulting Physician (Orthopedic Surgery) Vanessa Kick, MD as Consulting Physician (Obstetrics and Gynecology) Magrinat, Virgie Dad, MD (Inactive) as Consulting Physician (Oncology) Rolm Bookbinder, MD as Consulting Physician (General Surgery) Thea Silversmith, MD as Consulting Physician (Radiation Oncology)  SURGICAL HISTORY She  has a past surgical history that includes Incontinence surgery  (2009  &  2005); BLADDER TACK (2003); Vein bypass surgery (AGE 56); transthoracic echocardiogram (06-16-2013); Cardiovascular stress test (07-02-2013  [facility]); CYSTO/  HYDRODISTENTION/  BLADDER BX/   INSTILLATION THERAPY (08-29-2010); Vaginal hysterectomy (2000); Cataract extraction w/ intraocular lens implant (Left, 01/2014); Excision of mesh (N/A, 03/23/2014); Excision vaginal cyst (N/A, 04/06/2014); Radioactive seed guided mastectomy with axillary sentinel lymph node biopsy (Right, 05/18/2016); Breast lumpectomy (Right, 05/18/2016); Breast biopsy (Right, 04/18/2016); Cholecystectomy (N/A, 12/10/2017); Cataract extraction w/ intraocular lens implant (Right, 12/2017); Breast mass removal (Left, 1985); and Anterior cervical decomp/discectomy fusion (N/A, 09/16/2021). FAMILY HISTORY Her family history includes Diabetes in her mother; Diverticulitis in her mother and sister; Intestinal polyp in her sister; Lung cancer in her brother. SOCIAL HISTORY She  reports that she quit smoking about 27 years ago. Her smoking use included cigarettes. Her smokeless tobacco use includes snuff. She reports that she does not drink alcohol and does not use drugs.   MEDICARE WELLNESS OBJECTIVES: Physical activity: Current Exercise Habits: Home exercise routine, Type of exercise: walking, Time (Minutes): 30, Frequency (Times/Week): 7, Weekly Exercise (Minutes/Week): 210, Intensity: Mild Cardiac risk factors: Cardiac Risk Factors include: advanced age (>74mn, >>60women);dyslipidemia;hypertension;sedentary lifestyle;obesity (BMI >30kg/m2) Depression/mood screen:      12/13/2022   12:15 PM  Depression screen PHQ 2/9  Decreased Interest 0  Down, Depressed, Hopeless 0  PHQ - 2 Score 0    ADLs:     12/13/2022   12:15 PM 09/11/2022    1:59 PM  In your present state of health, do you have any difficulty performing the following activities:  Hearing? 0 0  Vision? 0 0  Difficulty concentrating or making decisions?   0  Walking or climbing stairs? 1 0  Dressing or bathing? 0 0  Doing errands, shopping? 0 0     Cognitive Testing  Alert? Yes  Normal Appearance?Yes  Oriented to person? Yes  Place? Yes   Time? Yes  Recall of three objects?  Yes  Can perform simple calculations? Yes  Displays appropriate judgment?Yes  Can read the correct time from a watch face?Yes  EOL planning: Does Patient Have a Medical Advance Directive?: No Would patient like information on creating a medical advance directive?: No - Patient declined  Review of Systems  Constitutional:  Negative for chills, fever and weight loss.  HENT:  Negative for congestion and hearing loss.   Eyes:  Negative for blurred vision and double vision.  Respiratory:  Negative for cough and shortness of breath.   Cardiovascular:  Negative for chest pain, palpitations, orthopnea and leg swelling.  Gastrointestinal:  Positive for abdominal pain (LLQ), diarrhea and nausea. Negative for constipation, heartburn and vomiting.  Musculoskeletal:  Negative for falls, joint pain and myalgias.  Skin:  Negative for rash.  Neurological:  Negative for dizziness, tingling, tremors, loss of consciousness and headaches.  Psychiatric/Behavioral:  Negative for depression, memory loss and suicidal ideas.      Objective:     Today's Vitals   12/13/22 1137  BP: 128/82  Pulse: 82  Temp: (!) 97.5 F (36.4 C)  SpO2: 99%  Weight: 167 lb (75.8 kg)  Height: '5\' 2"'$  (1.575 m)   Body mass index is 30.54 kg/m.  General appearance: alert, mild distress from abdominal pain, WD/WN, female HEENT: normocephalic, sclerae anicteric, TMs pearly, nares patent, no discharge or erythema, pharynx normal Oral cavity: MMM, no lesions Neck: supple, no lymphadenopathy, no thyromegaly, no masses Heart: RRR, normal S1, S2, no murmurs Lungs: CTA bilaterally, no wheezes, rhonchi, or rales Abdomen: +bs, soft, Tenderness noted LLQ with light pressure, do not palpate a  mass Musculoskeletal: nontender, no swelling, no obvious deformity Extremities: no edema, no cyanosis, no clubbing Pulses: 2+ symmetric, upper and lower extremities, normal cap refill Neurological: alert, oriented x 3, CN2-12 intact, strength normal upper extremities and lower extremities, sensation normal throughout, DTRs 2+ throughout, no cerebellar signs, gait normal Psychiatric: normal affect, behavior normal, pleasant   Medicare Attestation I have personally reviewed: [name] medical and social history Their use of alcohol, tobacco or illicit drugs Their current medications and supplements [name] functional ability including ADLs,fall risks, home safety risks, cognitive, and hearing and visual impairment Diet and physical activities Evidence for depression or mood disorders  [name] weight, height, BMI, and visual acuity have been recorded in the chart.  I have made referrals, counseling, and provided education to the patient based on review of the above and I have provided the patient with a written personalized care plan for preventive services.     Alycia Rossetti, NP   12/13/2022

## 2022-12-13 ENCOUNTER — Other Ambulatory Visit: Payer: Self-pay | Admitting: Nurse Practitioner

## 2022-12-13 ENCOUNTER — Ambulatory Visit (HOSPITAL_COMMUNITY)
Admission: RE | Admit: 2022-12-13 | Discharge: 2022-12-13 | Disposition: A | Discharge status: Home / Self Care | Payer: Medicare Other | Source: Ambulatory Visit | Attending: Nurse Practitioner | Admitting: Nurse Practitioner

## 2022-12-13 ENCOUNTER — Encounter: Payer: Self-pay | Admitting: Nurse Practitioner

## 2022-12-13 ENCOUNTER — Encounter (HOSPITAL_COMMUNITY): Payer: Self-pay

## 2022-12-13 ENCOUNTER — Ambulatory Visit (INDEPENDENT_AMBULATORY_CARE_PROVIDER_SITE_OTHER): Payer: Medicare Other | Admitting: Nurse Practitioner

## 2022-12-13 VITALS — BP 128/82 | HR 82 | Temp 97.5°F | Ht 62.0 in | Wt 167.0 lb

## 2022-12-13 DIAGNOSIS — I10 Essential (primary) hypertension: Secondary | ICD-10-CM

## 2022-12-13 DIAGNOSIS — R7309 Other abnormal glucose: Secondary | ICD-10-CM | POA: Diagnosis not present

## 2022-12-13 DIAGNOSIS — Z0000 Encounter for general adult medical examination without abnormal findings: Secondary | ICD-10-CM

## 2022-12-13 DIAGNOSIS — N182 Chronic kidney disease, stage 2 (mild): Secondary | ICD-10-CM

## 2022-12-13 DIAGNOSIS — M797 Fibromyalgia: Secondary | ICD-10-CM

## 2022-12-13 DIAGNOSIS — R1032 Left lower quadrant pain: Secondary | ICD-10-CM | POA: Diagnosis not present

## 2022-12-13 DIAGNOSIS — K5792 Diverticulitis of intestine, part unspecified, without perforation or abscess without bleeding: Secondary | ICD-10-CM

## 2022-12-13 DIAGNOSIS — F419 Anxiety disorder, unspecified: Secondary | ICD-10-CM | POA: Diagnosis not present

## 2022-12-13 DIAGNOSIS — Z0001 Encounter for general adult medical examination with abnormal findings: Secondary | ICD-10-CM

## 2022-12-13 DIAGNOSIS — E039 Hypothyroidism, unspecified: Secondary | ICD-10-CM

## 2022-12-13 DIAGNOSIS — Z79899 Other long term (current) drug therapy: Secondary | ICD-10-CM | POA: Diagnosis not present

## 2022-12-13 DIAGNOSIS — E876 Hypokalemia: Secondary | ICD-10-CM | POA: Diagnosis not present

## 2022-12-13 DIAGNOSIS — K219 Gastro-esophageal reflux disease without esophagitis: Secondary | ICD-10-CM

## 2022-12-13 DIAGNOSIS — K5730 Diverticulosis of large intestine without perforation or abscess without bleeding: Secondary | ICD-10-CM | POA: Diagnosis not present

## 2022-12-13 DIAGNOSIS — E782 Mixed hyperlipidemia: Secondary | ICD-10-CM

## 2022-12-13 DIAGNOSIS — R6889 Other general symptoms and signs: Secondary | ICD-10-CM | POA: Diagnosis not present

## 2022-12-13 DIAGNOSIS — K760 Fatty (change of) liver, not elsewhere classified: Secondary | ICD-10-CM | POA: Diagnosis not present

## 2022-12-13 DIAGNOSIS — E669 Obesity, unspecified: Secondary | ICD-10-CM

## 2022-12-13 DIAGNOSIS — E559 Vitamin D deficiency, unspecified: Secondary | ICD-10-CM | POA: Diagnosis not present

## 2022-12-13 DIAGNOSIS — C50411 Malignant neoplasm of upper-outer quadrant of right female breast: Secondary | ICD-10-CM

## 2022-12-13 DIAGNOSIS — K529 Noninfective gastroenteritis and colitis, unspecified: Secondary | ICD-10-CM

## 2022-12-13 MED ORDER — CIPROFLOXACIN HCL 500 MG PO TABS: 500.0000 mg | ORAL_TABLET | Freq: Two times a day (BID) | ORAL | 0 refills | Status: AC

## 2022-12-13 MED ORDER — ONDANSETRON HCL 4 MG PO TABS: 4.0000 mg | ORAL_TABLET | Freq: Every day | ORAL | 1 refills | Status: AC | PRN

## 2022-12-13 MED ORDER — METRONIDAZOLE 500 MG PO TABS: 500.0000 mg | ORAL_TABLET | Freq: Two times a day (BID) | ORAL | 0 refills | Status: AC

## 2022-12-13 MED ORDER — IOHEXOL 300 MG/ML  SOLN
100.0000 mL | Freq: Once | INTRAMUSCULAR | Status: AC | PRN
  Administered 2022-12-13: 100 mL via INTRAVENOUS

## 2022-12-13 MED ORDER — CHOLESTYRAMINE 4 G PO PACK: 4.0000 g | PACK | Freq: Two times a day (BID) | ORAL | 5 refills | Status: DC

## 2022-12-13 NOTE — Progress Notes (Signed)
LVM to call for results

## 2022-12-13 NOTE — Patient Instructions (Signed)

## 2022-12-14 LAB — LIPID PANEL
Cholesterol: 147 mg/dL (ref <200)
HDL: 64 mg/dL (ref >=50)
LDL Cholesterol (Calc): 61 mg/dL (calc)
Non-HDL Cholesterol (Calc): 83 mg/dL (calc) (ref <130)
Total CHOL/HDL Ratio: 2.3 (calc) (ref <5.0)
Triglycerides: 137 mg/dL (ref <150)

## 2022-12-14 LAB — COMPLETE METABOLIC PANEL WITH GFR
AG Ratio: 1.8 (calc) (ref 1.0–2.5)
ALT: 24 U/L (ref 6–29)
AST: 25 U/L (ref 10–35)
Albumin: 4.2 g/dL (ref 3.6–5.1)
Alkaline phosphatase (APISO): 91 U/L (ref 37–153)
BUN: 10 mg/dL (ref 7–25)
CO2: 27 mmol/L (ref 20–32)
Calcium: 9.4 mg/dL (ref 8.6–10.4)
Chloride: 103 mmol/L (ref 98–110)
Creat: 0.68 mg/dL (ref 0.50–1.05)
Globulin: 2.3 g/dL (calc) (ref 1.9–3.7)
Glucose, Bld: 92 mg/dL (ref 65–99)
Potassium: 4.9 mmol/L (ref 3.5–5.3)
Sodium: 139 mmol/L (ref 135–146)
Total Bilirubin: 0.4 mg/dL (ref 0.2–1.2)
Total Protein: 6.5 g/dL (ref 6.1–8.1)
eGFR: 97 mL/min/{1.73_m2} (ref >=60)

## 2022-12-14 LAB — CBC WITH DIFFERENTIAL/PLATELET
Absolute Monocytes: 460 cells/uL (ref 200–950)
Basophils Absolute: 31 cells/uL (ref 0–200)
Basophils Relative: 0.4 %
Eosinophils Absolute: 70 cells/uL (ref 15–500)
Eosinophils Relative: 0.9 %
HCT: 40.9 % (ref 35.0–45.0)
Hemoglobin: 13.4 g/dL (ref 11.7–15.5)
Lymphs Abs: 3518 cells/uL (ref 850–3900)
MCH: 27.3 pg (ref 27.0–33.0)
MCHC: 32.8 g/dL (ref 32.0–36.0)
MCV: 83.5 fL (ref 80.0–100.0)
MPV: 10 fL (ref 7.5–12.5)
Monocytes Relative: 5.9 %
Neutro Abs: 3721 cells/uL (ref 1500–7800)
Neutrophils Relative %: 47.7 %
Platelets: 325 10*3/uL (ref 140–400)
RBC: 4.9 10*6/uL (ref 3.80–5.10)
RDW: 13 % (ref 11.0–15.0)
Total Lymphocyte: 45.1 %
WBC: 7.8 10*3/uL (ref 3.8–10.8)

## 2022-12-14 LAB — TSH: TSH: 0.82 mIU/L (ref 0.40–4.50)

## 2022-12-14 LAB — MAGNESIUM: Magnesium: 2.2 mg/dL (ref 1.5–2.5)

## 2022-12-15 NOTE — Progress Notes (Unsigned)
12/21/2022 ROCHELE LUECK [id number] Nov 17, 1956  Referring provider: Unk Pinto, MD Primary GI doctor: Dr. Loletha Carrow  ASSESSMENT AND PLAN:   67 yr/old female hx of IBS diarrhea predominate, s/p cholecystectomy presents with diarrhea and AB pain worse for a month, associated fecal incontinence and nocturnal symptoms Colon 2021 diverticulosis with haustral thickening/tortuosity.  12/13/2022 CT AB and pelvis for diarrhea and AB pain showed sigmoid colonic diverticulosis with wall thickening versus under tension of sigmoid colon may reflect mild colitis including segmental colitis associated diverticulosis given appearance Patient had negative FOBT in the office which is reassuring  Will check GI pathogen panel to rule out infection Will check sed rate CRP to evaluate for inflammation Will check pancreatic elastase and celiac panel FODMAP given, unable to tolerate the liquid/powder so many times during the day, will try colestid pills 2 pills twice a day.  Dicyclomine given rather than levsin After further discussion with the patient we will plan on scheduling for colonoscopy to evaluate for scad/microscopic colitis, will cancel if GI pathogen panel positive Will check for anemia, if positive we will plan for endoscopy as well as colonoscopy  Hepatic steatosis --Continue to work on risk factor modification including diet exercise and control of risk factors including blood sugars. - monitor q 6 months.   Patient Care Team: Unk Pinto, MD as PCP - General (Internal Medicine) Fay Records, MD as Consulting Physician (Cardiology) [name], MD as Consulting Physician (Orthopedic Surgery) Vanessa Kick, MD as Consulting Physician (Obstetrics and Gynecology) Magrinat, Virgie Dad, MD (Inactive) as Consulting Physician (Oncology) Rolm Bookbinder, MD as Consulting Physician (General Surgery) Thea Silversmith, MD as Consulting Physician (Radiation Oncology)  HISTORY OF PRESENT  ILLNESS: 67 y.o. female with a past medical history of IBS mixed, status post cholecystectomy and others listed below presents for evaluation of AB pain and diarrhea.   11/25/2019 CT the abdomen pelvis with contrast for diarrhea LLQ pain was normal, diverticulosis, hepatic steatosis Stool studies were normal.  Patient was given cholestyramine twice daily. Discussed that overall symptoms are consistent with IBS.   12/30/2019 colonoscopy for change in bowel habits and left lower abdominal pain and diarrhea up to 10 times a day.  Repeat recommended in 10 years.  Biopsies were normal.   12/13/2022 office visit for Medicare wellness with PCP thought to have diverticulitis given Flagyl and Cipro for 14 days, left lower quadrant discomfort nausea and chills CBC showed no leukocytosis, no anemia, normal thyroid, normal kidney and liver 12/13/2022 CT abdomen pelvis with contrast for 3 to 4 weeks of left lower quadrant abdominal pain sigmoid colonic diverticulosis with wall thickening versus under tension of sigmoid colon may reflect mild colitis including segmental colitis associated diverticulosis given appearance would consider further evaluation with colonoscopy upon resolution of patient's current symptomology, diffuse hepatic steatosis, small hiatal hernia, aortic atherosclerosis.  She states for at least 4-5 weeks she has had AB pain and diarrhea around christmas.  She states she normally has issues with her stomach and can stop certain foods like fatty foods but this did not help. Started her cholestyramine that helps normally but she has been taking 2-3 a day without any help. She has tried imodium, [place].  She states the levsin helps the cramping but not the diarrhea.   Then she states she started about 3 weeks ago to have worsening AB cramping, would keep her from sleeping. Laying flat on her back helped.  Week before last she had severe AB pain and  cramping, states she had one episode of just  passing out from pain.Marland Kitchen  Has urge to have BM, having fecal incontinence, can't count the amount of stools, has had nocturnal symptoms.  She denies hematochezia.  States since her CT scan after drinking the barium, she has had dark stool.  She is not on iron or pepto. Denies GERD.  She has had one night of nausea with vomiting.  Has had chills, but no subjective fever.   She  reports that she quit smoking about 27 years ago. Her smoking use included cigarettes. Her smokeless tobacco use includes snuff. She reports that she does not drink alcohol and does not use drugs.  Current Medications:   Current Outpatient Medications (Endocrine & Metabolic):    levothyroxine (SYNTHROID) 100 MCG tablet, Take  1 tablet  Daily  on an empty stomach with only water  for 30 minutes & no Antacid meds, Calcium or Magnesium for 4 hours & avoid Biotin   metFORMIN (GLUCOPHAGE-XR) 500 MG 24 hr tablet, Take 2 tablets 2 x/ day with meals  for Diabetes  Current Outpatient Medications (Cardiovascular):    colestipol (COLESTID) 1 g tablet, Take 2 tablets (2 g total) by mouth 2 (two) times daily.   ezetimibe (ZETIA) 10 MG tablet, Take  1 tablet  Daily  for Cholesterol                                              /                    TAKE                        BY                  MOUTH                                  ONCE DAILY   rosuvastatin (CRESTOR) 10 MG tablet, Take  1 tablet  Daily for Cholesterol                                           /                        TAKE                                    BY                        MOUTH                          ONCE DAILY  Current Outpatient Medications (Respiratory):    cetirizine (ZYRTEC) 10 MG tablet, Take 10 mg by mouth daily as needed for allergies.   fluticasone (FLONASE) 50 MCG/ACT nasal spray, USE TWO SPRAY(S) IN EACH NOSTRIL ONCE DAILY AS NEEDED  Current Outpatient Medications (Analgesics):    rizatriptan (MAXALT) 10 MG tablet, Take 10 mg by mouth as needed  for migraine. May repeat in 2 hours if needed   Current Outpatient Medications (Other):    ALPRAZolam (XANAX) 0.5 MG tablet, TAKE 1/2 TO 1 TABLET BY MOUTH 2-3 TIMES DAILY ONLY IF NEEDED FOR ANXIETY ATTACH AND LIMIT TO 5 DAYS A WEEK TO AVOID ADDICTION AND DEMENTIA   Ascorbic Acid (VITAMIN C) 1000 MG tablet, Take 1,000 mg by mouth daily.   Blood Glucose Monitoring Suppl w/Device KIT, Test blood sugar once daily or as directed.   Cholecalciferol (VITAMIN D-3) 5000 UNITS TABS, Take 5,000 Units by mouth 2 (two) times daily.    ciprofloxacin (CIPRO) 500 MG tablet, Take 1 tablet (500 mg total) by mouth 2 (two) times daily for 14 days.   dicyclomine (BENTYL) 20 MG tablet, Take 1 tablet (20 mg total) by mouth 3 (three) times daily as needed for spasms.   famotidine (PEPCID) 40 MG tablet, Take 40 mg by mouth daily as needed for heartburn or indigestion.   gabapentin (NEURONTIN) 600 MG tablet, Take  1 tablet   3 x /day  for Chronic Pain                                       /                         TAKE                              BY                  MOUTH   glucose blood (FREESTYLE LITE) test strip, Test blood sugar once daily or as directed.   Lancets MISC, Test blood sugar once daily or as directed.   meclizine (ANTIVERT) 25 MG tablet, TAKE ONE-HALF TO ONE TABLET BY MOUTH UP TO THREE TIMES DAILY FOR MOTION SICKNESS/DIZZINESS   methocarbamol (ROBAXIN) 500 MG tablet, Take 1 tablet (500 mg total) by mouth every 6 (six) hours as needed for muscle spasms.   metroNIDAZOLE (FLAGYL) 500 MG tablet, Take 1 tablet (500 mg total) by mouth 2 (two) times daily for 14 days. 14 days   ondansetron (ZOFRAN) 4 MG tablet, Take 1 tablet (4 mg total) by mouth daily as needed for nausea or vomiting.   potassium chloride SA (KLOR-CON M) 20 MEQ tablet, Take  1 tablet  2 x /day  for Potassium                                                  /                                                           TAKE                                              BY  MOUTH   topiramate (TOPAMAX) 50 MG tablet, Take  1 tablet  2 x /day  at Suppertime & Bedtime for Dieting & Weight loss                                               /                     TAKE 1/2 TO 1 TABLET BY MOUTH   venlafaxine XR (EFFEXOR-XR) 37.5 MG 24 hr capsule, Take 1 capsule (37.5 mg total) by mouth daily with breakfast.   zinc gluconate 50 MG tablet, Take 50 mg by mouth daily.  Medical History:  Past Medical History:  Diagnosis Date   Anxiety    Breast cancer ([place])    Complication of anesthesia    hypotension   DDD (degenerative disc disease), lumbar    Diabetes mellitus without complication ([place])    Erosion of vaginal mesh ([facility])    Fibromyalgia    GERD (gastroesophageal reflux disease)    History of gastric ulcer    History of radiation therapy 07/06/16- 08/22/16   Right Breast   Hypertension    Hypothyroidism    Personal history of radiation therapy    Rectocele    Allergies:  Allergies  Allergen Reactions   Lipitor [Atorvastatin] Other (See Comments)    Muscle and legs aches   Shellfish Allergy Diarrhea and Nausea And Vomiting   Betadine [Povidone Iodine] Rash   Iodine     Pt unsure of reaction      Surgical History:  She  has a past surgical history that includes Incontinence surgery (2009  &  2005); BLADDER TACK (2003); Vein bypass surgery (AGE 49); transthoracic echocardiogram (06-16-2013); Cardiovascular stress test (07-02-2013  [facility]); CYSTO/  HYDRODISTENTION/  BLADDER BX/   INSTILLATION THERAPY (08-29-2010); Vaginal hysterectomy (2000); Cataract extraction w/ intraocular lens implant (Left, 01/2014); Excision of mesh (N/A, 03/23/2014); Excision vaginal cyst (N/A, 04/06/2014); Radioactive seed guided mastectomy with axillary sentinel lymph node biopsy (Right, 05/18/2016); Breast lumpectomy (Right, 05/18/2016); Breast biopsy (Right, 04/18/2016); Cholecystectomy (N/A, 12/10/2017); Cataract  extraction w/ intraocular lens implant (Right, 12/2017); Breast mass removal (Left, 1985); and Anterior cervical decomp/discectomy fusion (N/A, 09/16/2021). Family History:  Her family history includes Diabetes in her mother; Diverticulitis in her mother and sister; High blood pressure in her brother; Intestinal polyp in her sister; Lung cancer in her brother.  REVIEW OF SYSTEMS  : All other systems reviewed and negative except where noted in the History of Present Illness.  PHYSICAL EXAM: BP 122/88   Pulse 71   Ht '5\' 3"'$  (1.6 m)   Wt 165 lb (74.8 kg)   SpO2 98%   BMI 29.23 kg/m  General:   Pleasant, well developed female in no acute distress Head:   Normocephalic and atraumatic. Eyes:  sclerae anicteric,conjunctive pink  Heart:   regular rate and rhythm Pulm:  Clear anteriorly; no wheezing Abdomen:   Soft, Obese AB, Active bowel sounds. moderate tenderness in the LLQ. With guarding and Without rebound, No organomegaly appreciated. Rectal: Normal external rectal exam, decreased rectal tone with cavernous rectum, no internal hemorrhoids appreciated, no masses, non tender, scant brown stool, hemoccult Negative Extremities:  Without edema. Msk: Symmetrical without gross deformities. Peripheral pulses intact.  Neurologic:  Alert and  oriented x4;  No focal deficits.  Skin:  Dry and intact without significant lesions or rashes. Psychiatric:  Cooperative. Normal mood and affect.  RELEVANT LABS AND IMAGING: CBC    Component Value Date/Time   WBC 7.8 12/13/2022 0000   RBC 4.90 12/13/2022 0000   HGB 13.4 12/13/2022 0000   HGB 12.1 07/11/2021 1123   HGB 13.6 07/03/2017 1241   HCT 40.9 12/13/2022 0000   HCT 41.9 07/03/2017 1241   PLT 325 12/13/2022 0000   PLT 241 07/11/2021 1123   PLT 257 07/03/2017 1241   MCV 83.5 12/13/2022 0000   MCV 85.3 07/03/2017 1241   MCH 27.3 12/13/2022 0000   MCHC 32.8 12/13/2022 0000   RDW 13.0 12/13/2022 0000   RDW 13.6 07/03/2017 1241   LYMPHSABS  3,518 12/13/2022 0000   LYMPHSABS 5.6 (H) 07/03/2017 1241   MONOABS 0.5 07/11/2021 1123   MONOABS 0.7 07/03/2017 1241   EOSABS 70 12/13/2022 0000   EOSABS 0.0 07/03/2017 1241   BASOSABS 31 12/13/2022 0000   BASOSABS 0.0 07/03/2017 1241    CMP     Component Value Date/Time   NA 139 12/13/2022 0000   NA 144 07/03/2017 1241   K 4.9 12/13/2022 0000   K 3.8 07/03/2017 1241   CL 103 12/13/2022 0000   CO2 27 12/13/2022 0000   CO2 27 07/03/2017 1241   GLUCOSE 92 12/13/2022 0000   GLUCOSE 142 (H) 07/03/2017 1241   BUN 10 12/13/2022 0000   BUN 11.2 07/03/2017 1241   CREATININE 0.68 12/13/2022 0000   CREATININE 0.9 07/03/2017 1241   CALCIUM 9.4 12/13/2022 0000   CALCIUM 9.2 07/03/2017 1241   PROT 6.5 12/13/2022 0000   PROT 6.7 07/03/2017 1241   ALBUMIN 3.4 (L) 07/11/2021 1123   ALBUMIN 3.7 07/03/2017 1241   AST 25 12/13/2022 0000   AST 63 (H) 07/11/2021 1123   AST 22 07/03/2017 1241   ALT 24 12/13/2022 0000   ALT 71 (H) 07/11/2021 1123   ALT 37 07/03/2017 1241   ALKPHOS 65 07/11/2021 1123   ALKPHOS 55 07/03/2017 1241   BILITOT 0.4 12/13/2022 0000   BILITOT 0.4 07/11/2021 1123   BILITOT 0.29 07/03/2017 1241   GFRNONAA >60 07/11/2021 1123   GFRNONAA 100 02/02/2021 1356   GFRAA 116 02/02/2021 1356     Vladimir Crofts, PA-C 12:07 PM

## 2022-12-20 ENCOUNTER — Encounter: Payer: Self-pay | Admitting: Internal Medicine

## 2022-12-21 ENCOUNTER — Other Ambulatory Visit (INDEPENDENT_AMBULATORY_CARE_PROVIDER_SITE_OTHER): Payer: Medicare Other

## 2022-12-21 ENCOUNTER — Ambulatory Visit: Payer: Medicare Other | Admitting: Physician Assistant

## 2022-12-21 ENCOUNTER — Encounter: Payer: Self-pay | Admitting: Physician Assistant

## 2022-12-21 VITALS — BP 122/88 | HR 71 | Ht 63.0 in | Wt 165.0 lb

## 2022-12-21 DIAGNOSIS — K760 Fatty (change of) liver, not elsewhere classified: Secondary | ICD-10-CM | POA: Diagnosis not present

## 2022-12-21 DIAGNOSIS — R1032 Left lower quadrant pain: Secondary | ICD-10-CM

## 2022-12-21 DIAGNOSIS — R197 Diarrhea, unspecified: Secondary | ICD-10-CM

## 2022-12-21 LAB — COMPREHENSIVE METABOLIC PANEL
ALT: 34 U/L (ref 0–35)
AST: 34 U/L (ref 0–37)
Albumin: 4.2 g/dL (ref 3.5–5.2)
Alkaline Phosphatase: 79 U/L (ref 39–117)
BUN: 7 mg/dL (ref 6–23)
CO2: 27 mEq/L (ref 19–32)
Calcium: 9 mg/dL (ref 8.4–10.5)
Chloride: 104 mEq/L (ref 96–112)
Creatinine, Ser: 0.61 mg/dL (ref 0.40–1.20)
GFR: 94.06 mL/min (ref >60.00)
Glucose, Bld: 91 mg/dL (ref 70–99)
Potassium: 3.6 mEq/L (ref 3.5–5.1)
Sodium: 141 mEq/L (ref 135–145)
Total Bilirubin: 0.3 mg/dL (ref 0.2–1.2)
Total Protein: 6.7 g/dL (ref 6.0–8.3)

## 2022-12-21 LAB — CBC WITH DIFFERENTIAL/PLATELET
Basophils Absolute: 0.1 10*3/uL (ref 0.0–0.1)
Basophils Relative: 1.2 % (ref 0.0–3.0)
Eosinophils Absolute: 0.1 10*3/uL (ref 0.0–0.7)
Eosinophils Relative: 1.5 % (ref 0.0–5.0)
HCT: 41.2 % (ref 36.0–46.0)
Hemoglobin: 13.6 g/dL (ref 12.0–15.0)
Lymphocytes Relative: 48.5 % — ABNORMAL HIGH (ref 12.0–46.0)
Lymphs Abs: 4 10*3/uL (ref 0.7–4.0)
MCHC: 33.1 g/dL (ref 30.0–36.0)
MCV: 82 fl (ref 78.0–100.0)
Monocytes Absolute: 0.6 10*3/uL (ref 0.1–1.0)
Monocytes Relative: 7.2 % (ref 3.0–12.0)
Neutro Abs: 3.4 10*3/uL (ref 1.4–7.7)
Neutrophils Relative %: 41.6 % — ABNORMAL LOW (ref 43.0–77.0)
Platelets: 293 10*3/uL (ref 150.0–400.0)
RBC: 5.03 Mil/uL (ref 3.87–5.11)
RDW: 14.6 % (ref 11.5–15.5)
WBC: 8.3 10*3/uL (ref 4.0–10.5)

## 2022-12-21 LAB — IBC + FERRITIN
Ferritin: 32.4 ng/mL (ref 10.0–291.0)
Iron: 64 ug/dL (ref 42–145)
Saturation Ratios: 15.1 % — ABNORMAL LOW (ref 20.0–50.0)
TIBC: 424.2 ug/dL (ref 250.0–450.0)
Transferrin: 303 mg/dL (ref 212.0–360.0)

## 2022-12-21 LAB — HIGH SENSITIVITY CRP: CRP, High Sensitivity: 3.1 mg/L (ref 0.000–5.000)

## 2022-12-21 LAB — SEDIMENTATION RATE: Sed Rate: 23 mm/hr (ref 0–30)

## 2022-12-21 MED ORDER — DICYCLOMINE HCL 20 MG PO TABS: 20.0000 mg | ORAL_TABLET | Freq: Three times a day (TID) | ORAL | 0 refills | Status: DC | PRN

## 2022-12-21 MED ORDER — NA SULFATE-K SULFATE-MG SULF 17.5-3.13-1.6 GM/177ML PO SOLN: 1.0000 | Freq: Once | ORAL | 0 refills | Status: AC

## 2022-12-21 MED ORDER — COLESTIPOL HCL 1 G PO TABS: 2.0000 g | ORAL_TABLET | Freq: Two times a day (BID) | ORAL | 0 refills | Status: DC

## 2022-12-21 NOTE — Patient Instructions (Addendum)
Your provider has requested that you go to the basement level for lab work before leaving today. Press "B" on the elevator. The lab is located at the first door on the left as you exit the elevator.  Please avoid milk products, raw fruits, raw vegetables, high fat foods, artifical sweeteners, and carbonated beverages until symptoms resolve Add on fiber supplement Do BRAT diet Go to the ER if any severe abdominal pain, fever, or weakness  Bland Diet A bland diet consists of foods that are often soft and do not have a lot of fat, fiber, or extra seasonings. Foods without fat, fiber, or seasoning are easier for the body to digest. They are also less likely to irritate your mouth, throat, stomach, and other parts of your digestive system. A bland diet is sometimes called a BRAT diet. What is my plan? Your health care provider or food and nutrition specialist (dietitian) may recommend specific changes to your diet to prevent symptoms or to treat your symptoms. These changes may include: Eating small meals often. Cooking food until it is soft enough to chew easily. Chewing your food well. Drinking fluids slowly. Not eating foods that are very spicy, sour, or fatty. Not eating citrus fruits, such as oranges and grapefruit. What do I need to know about this diet? Eat a variety of foods from the bland diet food list. Do not follow a bland diet longer than needed. Ask your health care provider whether you should take vitamins or supplements. What foods can I eat? Grains Hot cereals, such as cream of wheat. Rice. Bread, crackers, or tortillas made from refined white flour. Vegetables Canned or cooked vegetables. Mashed or boiled potatoes. Fruits Bananas. Applesauce. Other types of cooked or canned fruit with the skin and seeds removed, such as canned peaches or pears. Meats and other proteins Scrambled eggs. Creamy peanut butter or other nut butters. Lean, well-cooked meats, such as chicken or  fish. Tofu. Soups or broths. Dairy Low-fat dairy products, such as milk, cottage cheese, or yogurt. Beverages Water. Herbal tea. Apple juice. Fats and oils Mild salad dressings. Canola or olive oil. Sweets and desserts Pudding. Custard. Fruit gelatin. Ice cream. The items listed above may not be a complete list of recommended foods and beverages. Contact a dietitian for more options. What foods are not recommended? Grains Whole grain breads and cereals. Vegetables Raw vegetables. Fruits Raw fruits, especially citrus, berries, or dried fruits. Dairy Whole fat dairy foods. Beverages Caffeinated drinks. Alcohol. Seasonings and condiments Strongly flavored seasonings or condiments. Hot sauce. Salsa. Other foods Spicy foods. Fried foods. Sour foods, such as pickled or fermented foods. Foods with high sugar content. Foods high in fiber. The items listed above may not be a complete list of foods and beverages to avoid. Contact a dietitian for more information. Summary A bland diet consists of foods that are often soft and do not have a lot of fat, fiber, or extra seasonings. Foods without fat, fiber, or seasoning are easier for the body to digest. Check with your health care provider to see how long you should follow this diet plan. It is not meant to be followed for long periods. This information is not intended to replace advice given to you by your health care provider. Make sure you discuss any questions you have with your health care provider. Document Revised: 12/12/2017 Document Reviewed: 12/12/2017 Elsevier Patient Education  2022 [place] have been scheduled for a colonoscopy. Please follow written instructions given to you  at your visit today.  Please pick up your prep supplies at the pharmacy within the next 1-3 days. If you use inhalers (even only as needed), please bring them with you on the day of your procedure.  The [facility] providers would like to  encourage you to use [facility] to communicate with providers for non-urgent requests or questions.  Due to long hold times on the telephone, sending your provider a message by [facility] may be a faster and more efficient way to get a response.  Please allow 48 business hours for a response.  Please remember that this is for non-urgent requests.   Due to recent changes in healthcare laws, you may see the results of your imaging and laboratory studies on MyChart before your provider has had a chance to review them.  We understand that in some cases there may be results that are confusing or concerning to you. Not all laboratory results come back in the same time frame and the provider may be waiting for multiple results in order to interpret others.  Please give [place] 48 hours in order for your provider to thoroughly review all the results before contacting the office for clarification of your results.

## 2022-12-22 ENCOUNTER — Telehealth: Payer: Self-pay | Admitting: Physician Assistant

## 2022-12-22 NOTE — Telephone Encounter (Signed)
Estill Bamberg please see the note below from [facility] PA regarding moving her appt out. Pt was seen and scheduled at [facility] yesterday.

## 2022-12-22 NOTE — Telephone Encounter (Signed)
Explained to patient that we need GI profile results back prior to colonoscopy. Rescheduled patient's colonoscopy for 01/16/23 at 2:30 pm. Mailed new instructions to patient and told patient to call our office back if she does not receive them. Patient verbalized understanding.

## 2022-12-22 NOTE — Telephone Encounter (Signed)
Patient was seen recently for increasing diarrhea and abnormal CT. Due to the acuity patient was given GI pathogen panel to bring back. Unfortunately patient was scheduled for colonoscopy this coming Monday, we will need to reschedule this further out 2 to 4 weeks to assure GI pathogen panel is negative before proceeding. Please reschedule 2 to 4 weeks out with Dr. Loletha Carrow, remind patient to bring in GI pathogen panel and try to find out when this will be, this is imperative to get prior to colonoscopy.

## 2022-12-23 LAB — IGA: Immunoglobulin A: 98 mg/dL (ref 70–320)

## 2022-12-23 LAB — TISSUE TRANSGLUTAMINASE, IGA: (tTG) Ab, IgA: <1 U/mL

## 2022-12-25 ENCOUNTER — Encounter: Payer: Medicare Other | Admitting: Gastroenterology

## 2022-12-25 NOTE — Progress Notes (Signed)
____________________________________________________________  Attending physician addendum:  Thank you for sending this case to me and discussing it with me 12/22/22. I have reviewed the entire note and agree with the plan, and thank you for contacting the patient and moving the 12/25/22 colonoscopy to 01/16/23 while we rule out active infection.  Wilfrid Lund, MD  ____________________________________________________________

## 2022-12-27 ENCOUNTER — Other Ambulatory Visit: Payer: Medicare Other

## 2022-12-27 DIAGNOSIS — R1032 Left lower quadrant pain: Secondary | ICD-10-CM

## 2022-12-27 DIAGNOSIS — R197 Diarrhea, unspecified: Secondary | ICD-10-CM | POA: Diagnosis not present

## 2022-12-31 LAB — GI PROFILE, STOOL, PCR

## 2023-01-01 MED ORDER — VANCOMYCIN HCL 125 MG PO CAPS: 125.0000 mg | ORAL_CAPSULE | Freq: Four times a day (QID) | ORAL | 0 refills | Status: AC

## 2023-01-01 NOTE — Progress Notes (Signed)
Positive Cdiff Vancomycin 125 QID for 14 days sent into the pharmacy [facility] Start on florastor '250mg'$  1 cap BID for 1 month Hand washing, wiping down surfaces, and avoiding contamination discussed with the patient.  Avoid ABX in the future Follow up 4 weeks Follow up if any worsening symptoms or no improving symptoms, ER precautions discussed

## 2023-01-03 LAB — PANCREATIC ELASTASE, FECAL: Pancreatic Elastase-1, Stool: >500 mcg/g

## 2023-01-16 ENCOUNTER — Encounter: Payer: Medicare Other | Admitting: Gastroenterology

## 2023-01-26 ENCOUNTER — Other Ambulatory Visit: Payer: Self-pay | Admitting: Nurse Practitioner

## 2023-01-26 DIAGNOSIS — F419 Anxiety disorder, unspecified: Secondary | ICD-10-CM

## 2023-01-30 ENCOUNTER — Ambulatory Visit: Payer: Medicare Other | Admitting: Physician Assistant

## 2023-01-30 ENCOUNTER — Encounter: Payer: Self-pay | Admitting: Physician Assistant

## 2023-01-30 VITALS — BP 110/84 | HR 90 | Ht 63.0 in | Wt 164.2 lb

## 2023-01-30 DIAGNOSIS — K760 Fatty (change of) liver, not elsewhere classified: Secondary | ICD-10-CM | POA: Diagnosis not present

## 2023-01-30 DIAGNOSIS — A0472 Enterocolitis due to Clostridium difficile, not specified as recurrent: Secondary | ICD-10-CM | POA: Diagnosis not present

## 2023-01-30 DIAGNOSIS — R7303 Prediabetes: Secondary | ICD-10-CM

## 2023-01-30 DIAGNOSIS — R1032 Left lower quadrant pain: Secondary | ICD-10-CM

## 2023-01-30 NOTE — Progress Notes (Signed)
01/30/2023 Desiree Anderson [place]:[id number] February 18, 1956  Referring provider: Unk Pinto, MD Primary GI doctor: Dr. Loletha Carrow  ASSESSMENT AND PLAN:   Cdiff diarrhea diagnosed 01/31 treated with Vancomycin QID for 14 days, continue to have diarrhea  with history of IBS diarrhea predominate, s/p cholecystectomy  Rechecked Diathreix Cdiff here in the office, if negative can consider repeat colon versus imaging to evaluate abnormal CT 12/13/2022 Negative pancreatic elastase Normal colon 11/2015  continue Colestid 2 gram twice a day.  Some response with dicyclomine QID Do trial off metformin  Abnormal CT 01/17 normal colonoscopy 12/2019, recall 12/2029 12/13/2022 CT AB and pelvis for diarrhea and AB pain showed sigmoid colonic diverticulosis with wall thickening versus under distension of sigmoid colon may reflect mild colitis including segmental colitis associated diverticulosis given appearance No blood in stool, less likely SCAD, possible under distension versus Cdiff? Will discuss with Dr. Loletha Carrow possibly repeating CT versus colonoscopy if Cdiff is negative.   Hepatic steatosis --Continue to work on risk factor modification including diet exercise and control of risk factors including blood sugars. - monitor q 6 months.   Patient Care Team: Unk Pinto, MD as PCP - General (Internal Medicine) Desiree Records, MD as Consulting Physician (Cardiology) [name], MD as Consulting Physician (Orthopedic Surgery) Vanessa Kick, MD as Consulting Physician (Obstetrics and Gynecology) Magrinat, Virgie Dad, MD (Inactive) as Consulting Physician (Oncology) Rolm Bookbinder, MD as Consulting Physician (General Surgery) Thea Silversmith, MD as Consulting Physician (Radiation Oncology)  HISTORY OF PRESENT ILLNESS: 67 y.o. female with a past medical history of IBS mixed, status post cholecystectomy and others listed below presents for evaluation of AB pain and diarrhea.   11/25/2019 CT the  abdomen pelvis with contrast for diarrhea LLQ pain was normal, diverticulosis, hepatic steatosis Stool studies were normal.  Patient was given cholestyramine twice daily. Discussed that overall symptoms are consistent with IBS.   12/30/2019 colonoscopy for change in bowel habits and left lower abdominal pain and diarrhea up to 10 times a day.  Showed normal TI, normal mucosa, diverticulosis sigmoid colon, biopsies negative for microscopic colitis, recall 10 years  12/13/2022 office visit for Medicare wellness with PCP thought to have diverticulitis given Flagyl and Cipro for 14 days, left lower quadrant discomfort nausea and chills CBC showed no leukocytosis, no anemia, normal thyroid, normal kidney and liver 12/13/2022 CT abdomen pelvis with contrast for 3 to 4 weeks of left lower quadrant abdominal pain sigmoid colonic diverticulosis with wall thickening versus under tension of sigmoid colon may reflect mild colitis including segmental colitis associated diverticulosis given appearance would consider further evaluation with colonoscopy upon resolution of patient's current symptomology, diffuse hepatic steatosis, small hiatal hernia, aortic atherosclerosis.  12/21/2022 OV for diarrhea/nocturnal symptoms/fecal incontinence 12/27/2022 positive C. difficile treated with vancomycin 125 mg 4 times daily for 14 days Pancreatic elastase normal, negative celiac, no iron deficiency, normal sed rate, thyroid  Patient was scheduled for colonoscopy but this was rescheduled due to positive C. difficile, here for follow-up. She states she finished the Cdiff treatment and she continued to have diarrhea can be up to 10 times a day. Can have urgency and fecal incontinence.  In the morning she will have 2 episodes first thing in the AM.  Has some blood on the TP.  She states the dicyclomine has helped the AB cramping, has some stuttering with it but states it has helped tremendously.  She states later in the evening  she continues to have nausea without vomiting, zofran  does help.  She is on colestipol 2 gram twice a day.  She is still on 2 tablets twice a day.    She  reports that she quit smoking about 27 years ago. Her smoking use included cigarettes. Her smokeless tobacco use includes snuff. She reports that she does not drink alcohol and does not use drugs.  Current Medications:   Current Outpatient Medications (Endocrine & Metabolic):    levothyroxine (SYNTHROID) 100 MCG tablet, Take  1 tablet  Daily  on an empty stomach with only water  for 30 minutes & no Antacid meds, Calcium or Magnesium for 4 hours & avoid Biotin   metFORMIN (GLUCOPHAGE-XR) 500 MG 24 hr tablet, Take 2 tablets 2 x/ day with meals  for Diabetes  Current Outpatient Medications (Cardiovascular):    colestipol (COLESTID) 1 g tablet, Take 2 tablets (2 g total) by mouth 2 (two) times daily.   ezetimibe (ZETIA) 10 MG tablet, Take  1 tablet  Daily  for Cholesterol                                              /                    TAKE                        BY                  MOUTH                                  ONCE DAILY   rosuvastatin (CRESTOR) 10 MG tablet, Take  1 tablet  Daily for Cholesterol                                           /                        TAKE                                    BY                        MOUTH                          ONCE DAILY  Current Outpatient Medications (Respiratory):    cetirizine (ZYRTEC) 10 MG tablet, Take 10 mg by mouth daily as needed for allergies.   fluticasone (FLONASE) 50 MCG/ACT nasal spray, USE TWO SPRAY(S) IN EACH NOSTRIL ONCE DAILY AS NEEDED  Current Outpatient Medications (Analgesics):    rizatriptan (MAXALT) 10 MG tablet, Take 10 mg by mouth as needed for migraine. May repeat in 2 hours if needed   Current Outpatient Medications (Other):    ALPRAZolam (XANAX) 0.5 MG tablet, TAKE 1/2 TO 1 (ONE-HALF TO ONE) TABLET BY MOUTH TWICE DAILY TO THREE TIMES DAILY AS NEEDED FOR  ANXIETY *LIMIT TO 5 DAYS A WEEK*   Ascorbic Acid (VITAMIN C)  1000 MG tablet, Take 1,000 mg by mouth daily.   Blood Glucose Monitoring Suppl w/Device KIT, Test blood sugar once daily or as directed.   Cholecalciferol (VITAMIN D-3) 5000 UNITS TABS, Take 5,000 Units by mouth 2 (two) times daily.    dicyclomine (BENTYL) 20 MG tablet, Take 1 tablet (20 mg total) by mouth 3 (three) times daily as needed for spasms.   famotidine (PEPCID) 40 MG tablet, Take 40 mg by mouth daily as needed for heartburn or indigestion.   gabapentin (NEURONTIN) 600 MG tablet, Take  1 tablet   3 x /day  for Chronic Pain                                       /                         TAKE                              BY                  MOUTH   glucose blood (FREESTYLE LITE) test strip, Test blood sugar once daily or as directed.   Lancets MISC, Test blood sugar once daily or as directed.   meclizine (ANTIVERT) 25 MG tablet, TAKE ONE-HALF TO ONE TABLET BY MOUTH UP TO THREE TIMES DAILY FOR MOTION SICKNESS/DIZZINESS   methocarbamol (ROBAXIN) 500 MG tablet, Take 1 tablet (500 mg total) by mouth every 6 (six) hours as needed for muscle spasms.   ondansetron (ZOFRAN) 4 MG tablet, Take 1 tablet (4 mg total) by mouth daily as needed for nausea or vomiting.   potassium chloride SA (KLOR-CON M) 20 MEQ tablet, Take  1 tablet  2 x /day  for Potassium                                                  /                                                           TAKE                                             BY                                              MOUTH   topiramate (TOPAMAX) 50 MG tablet, Take  1 tablet  2 x /day  at Suppertime & Bedtime for Dieting & Weight loss                                               /  TAKE 1/2 TO 1 TABLET BY MOUTH   venlafaxine XR (EFFEXOR-XR) 37.5 MG 24 hr capsule, Take 1 capsule (37.5 mg total) by mouth daily with breakfast.   zinc gluconate 50 MG tablet, Take 50 mg by mouth  daily.  Medical History:  Past Medical History:  Diagnosis Date   Anxiety    Breast cancer ([place])    Complication of anesthesia    hypotension   DDD (degenerative disc disease), lumbar    Diabetes mellitus without complication ([place])    Erosion of vaginal mesh ([facility])    Fibromyalgia    GERD (gastroesophageal reflux disease)    History of gastric ulcer    History of radiation therapy 07/06/16- 08/22/16   Right Breast   Hypertension    Hypothyroidism    Personal history of radiation therapy    Rectocele    Allergies:  Allergies  Allergen Reactions   Lipitor [Atorvastatin] Other (See Comments)    Muscle and legs aches   Shellfish Allergy Diarrhea and Nausea And Vomiting   Betadine [Povidone Iodine] Rash   Iodine     Pt unsure of reaction      Surgical History:  She  has a past surgical history that includes Incontinence surgery (2009  &  2005); BLADDER TACK (2003); Vein bypass surgery (AGE 18); transthoracic echocardiogram (06-16-2013); Cardiovascular stress test (07-02-2013  [facility]); CYSTO/  HYDRODISTENTION/  BLADDER BX/   INSTILLATION THERAPY (08-29-2010); Vaginal hysterectomy (2000); Cataract extraction w/ intraocular lens implant (Left, 01/2014); Excision of mesh (N/A, 03/23/2014); Excision vaginal cyst (N/A, 04/06/2014); Radioactive seed guided mastectomy with axillary sentinel lymph node biopsy (Right, 05/18/2016); Breast lumpectomy (Right, 05/18/2016); Breast biopsy (Right, 04/18/2016); Cholecystectomy (N/A, 12/10/2017); Cataract extraction w/ intraocular lens implant (Right, 12/2017); Breast mass removal (Left, 1985); and Anterior cervical decomp/discectomy fusion (N/A, 09/16/2021). Family History:  Her family history includes Diabetes in her mother; Diverticulitis in her mother and sister; High blood pressure in her brother; Intestinal polyp in her sister; Lung cancer in her brother.  REVIEW OF SYSTEMS  : All other systems reviewed and negative except where noted in the  History of Present Illness.  PHYSICAL EXAM: BP 110/84   Pulse 90   Ht '5\' 3"'$  (1.6 m)   Wt 164 lb 4 oz (74.5 kg)   BMI 29.10 kg/m  General:   Pleasant, well developed female in no acute distress Head:   Normocephalic and atraumatic. Eyes:  sclerae anicteric,conjunctive pink  Heart:   regular rate and rhythm Pulm:  Clear anteriorly; no wheezing Abdomen:   Soft, Obese AB, Active bowel sounds. moderate tenderness in the LLQ. With guarding and Without rebound, No organomegaly appreciated. Rectal: Normal external rectal exam, decreased rectal tone with cavernous rectum, no internal hemorrhoids appreciated, no masses, non tender, scant brown stool, hemoccult Negative Extremities:  Without edema. Msk: Symmetrical without gross deformities. Peripheral pulses intact.  Neurologic:  Alert and  oriented x4;  No focal deficits.  Skin:   Dry and intact without significant lesions or rashes. Psychiatric:  Cooperative. Normal mood and affect.  RELEVANT LABS AND IMAGING: CBC    Component Value Date/Time   WBC 8.3 12/21/2022 1228   RBC 5.03 12/21/2022 1228   HGB 13.6 12/21/2022 1228   HGB 12.1 07/11/2021 1123   HGB 13.6 07/03/2017 1241   HCT 41.2 12/21/2022 1228   HCT 41.9 07/03/2017 1241   PLT 293.0 12/21/2022 1228   PLT 241 07/11/2021 1123   PLT 257 07/03/2017 1241   MCV 82.0 12/21/2022 1228  MCV 85.3 07/03/2017 1241   MCH 27.3 12/13/2022 0000   MCHC 33.1 12/21/2022 1228   RDW 14.6 12/21/2022 1228   RDW 13.6 07/03/2017 1241   LYMPHSABS 4.0 12/21/2022 1228   LYMPHSABS 5.6 (H) 07/03/2017 1241   MONOABS 0.6 12/21/2022 1228   MONOABS 0.7 07/03/2017 1241   EOSABS 0.1 12/21/2022 1228   EOSABS 0.0 07/03/2017 1241   BASOSABS 0.1 12/21/2022 1228   BASOSABS 0.0 07/03/2017 1241    CMP     Component Value Date/Time   NA 141 12/21/2022 1228   NA 144 07/03/2017 1241   K 3.6 12/21/2022 1228   K 3.8 07/03/2017 1241   CL 104 12/21/2022 1228   CO2 27 12/21/2022 1228   CO2 27 07/03/2017 1241    GLUCOSE 91 12/21/2022 1228   GLUCOSE 142 (H) 07/03/2017 1241   BUN 7 12/21/2022 1228   BUN 11.2 07/03/2017 1241   CREATININE 0.61 12/21/2022 1228   CREATININE 0.68 12/13/2022 0000   CREATININE 0.9 07/03/2017 1241   CALCIUM 9.0 12/21/2022 1228   CALCIUM 9.2 07/03/2017 1241   PROT 6.7 12/21/2022 1228   PROT 6.7 07/03/2017 1241   ALBUMIN 4.2 12/21/2022 1228   ALBUMIN 3.7 07/03/2017 1241   AST 34 12/21/2022 1228   AST 63 (H) 07/11/2021 1123   AST 22 07/03/2017 1241   ALT 34 12/21/2022 1228   ALT 71 (H) 07/11/2021 1123   ALT 37 07/03/2017 1241   ALKPHOS 79 12/21/2022 1228   ALKPHOS 55 07/03/2017 1241   BILITOT 0.3 12/21/2022 1228   BILITOT 0.4 07/11/2021 1123   BILITOT 0.29 07/03/2017 1241   GFRNONAA >60 07/11/2021 1123   GFRNONAA 100 02/02/2021 1356   GFRAA 116 02/02/2021 1356     Vladimir Crofts, PA-C 1:53 PM

## 2023-01-30 NOTE — Patient Instructions (Addendum)
Stop the metformin for a month to see if this helps the diarrhea Will recheck for Cdiff to see if this is still negative.  Will discuss colon versus EGD with Dr. Loletha Carrow    FODMAP stands for fermentable oligo-, di-, mono-saccharides and polyols (1). These are the scientific terms used to classify groups of carbs that are notorious for triggering digestive symptoms like bloating, gas and stomach pain.

## 2023-01-31 NOTE — Progress Notes (Signed)
____________________________________________________________  Attending physician addendum:  Thank you for sending this case to me. I have reviewed the entire note and agree with the plan.  Keep me up to date on the stool study result.  Wilfrid Lund, MD  ____________________________________________________________

## 2023-02-01 ENCOUNTER — Telehealth: Payer: Self-pay | Admitting: Physician Assistant

## 2023-02-01 NOTE — Telephone Encounter (Signed)
[facility] is calling to advise that the following PT has tested positive for c diff. Please advise.

## 2023-02-02 ENCOUNTER — Telehealth: Payer: Self-pay | Admitting: Physician Assistant

## 2023-02-02 DIAGNOSIS — A0472 Enterocolitis due to Clostridium difficile, not specified as recurrent: Secondary | ICD-10-CM

## 2023-02-02 MED ORDER — VANCOMYCIN HCL 125 MG PO CAPS: ORAL_CAPSULE | ORAL | 0 refills | Status: AC

## 2023-02-02 NOTE — Telephone Encounter (Signed)
Spoke with pt and she is aware of results and recommendations per Vicie Mutters PA.

## 2023-02-02 NOTE — Telephone Encounter (Signed)
See additional phone note. 

## 2023-02-02 NOTE — Telephone Encounter (Signed)
Will do pulse taper of vancomycin since had initial response at first 125 mg 4 times daily for 12 days, then 125 mg twice daily for 7 days, then 125 mg once daily for 7 days, then 125 mg every other day for 14 days #76 Low threshold to try to get patient dificin with blue sky if continuing symptoms  Start on florastor '250mg'$  1 cap BID for 1 month Hand washing, wiping down surfaces, and avoiding contamination discussed with the patient.  Avoid ABX in the future Follow up 4 weeks Follow up if any worsening symptoms or no improving symptoms, ER precautions discussed

## 2023-02-05 ENCOUNTER — Other Ambulatory Visit: Payer: Self-pay

## 2023-02-05 DIAGNOSIS — E039 Hypothyroidism, unspecified: Secondary | ICD-10-CM

## 2023-02-05 MED ORDER — LEVOTHYROXINE SODIUM 100 MCG PO TABS: ORAL_TABLET | ORAL | 1 refills | Status: DC

## 2023-02-07 ENCOUNTER — Other Ambulatory Visit: Payer: Self-pay

## 2023-02-07 DIAGNOSIS — E039 Hypothyroidism, unspecified: Secondary | ICD-10-CM

## 2023-02-07 MED ORDER — LEVOTHYROXINE SODIUM 100 MCG PO TABS: ORAL_TABLET | ORAL | 1 refills | Status: DC

## 2023-02-09 ENCOUNTER — Other Ambulatory Visit: Payer: Self-pay | Admitting: Physician Assistant

## 2023-02-09 DIAGNOSIS — R197 Diarrhea, unspecified: Secondary | ICD-10-CM

## 2023-02-11 NOTE — Progress Notes (Unsigned)
Future Appointments  Date Time Provider Department  02/12/2023 10:30 AM Unk Pinto, MD [facility]  03/28/2023                    cpe 10:00 AM Unk Pinto, MD [facility]  12/18/2023                  wellness 11:00 AM Alycia Rossetti, NP [facility]    History of Present Illness:      This very nice 67 y.o.  MWF with HTN, HLD, Pre-Diabetes, Hypothyroidism, Hx/o Breast cancer and Vitamin D Deficiency  presents who presents with  concern of skin discoloration of legs x weeks       Current Outpatient Medications on File Prior to Visit  Medication Sig   XANAX 0.5 MG tablet TAKE 1/2 TO 1 TABLET  TWICE DAILY TO THREE TIMES DAILY AS NEEDED    VITAMIN C 1000 MG tablet Take  daily.   ZYRTEC 10 MG tablet Take  daily as needed f   VITAMIN D 5000 u Take  2  times daily.    COLESTID  1 g tablet Take 2 tablets by mouth twice daily   dicyclomine 20 MG tablet Take 1 tablet 3 ( times daily as needed for spasms.   ZETIA 10 MG tablet Take  1 tablet  Daily     PEPCID 40 MG tablet Take  daily as needed    FLONASE  nasal spray USE TWO SPRAY(S) IN EACH NOSTRIL DAILY AS NEEDED   Gabapentin  600 MG tablet Take  1 tablet   3 x /day  for Chronic Pain    Levothyroxine 100 MCG tablet Take  1 tablet  Daily     meclizine (ANTIVERT) 25 MG tablet TAKE ONE-HALF TO ONE TABLET UP TO THREE TIMES DAILY    metFORMIN (GLUCOPHAGE-XR) 500 MG 24 hr tablet Take 2 tablets 2 x/ day with meals     methocarbamol (ROBAXIN) 500 MG tablet Take 1 tablet  every 6 hours as needed for    ondansetron (ZOFRAN) 4 MG tablet Take 1 tablet (4 mg total) by mouth daily as needed for nausea or vomiting.   potassium chloride SA (KLOR-CON M) 20 MEQ tablet Take  1 tablet  2 x /day    rizatriptan (MAXALT) 10 MG tablet Take as needed for migraine. May repeat in 2 hours if needed   rosuvastatin (CRESTOR) 10 MG tablet Take  1 tablet  Daily   topiramate (TOPAMAX) 50 MG tablet Take  1 tablet  2 x /day     vancomycin (VANCOCIN) 125 MG  capsule (02/02/2023)  Take 1 capsule  4 (four) times daily for 12 days, THEN 1 capsule (125 mg total) 2 (two) times daily for 7 days, THEN 1 capsule (125 mg total) daily for 7 days, THEN 1 capsule (125 mg total) every other day for 14 days.   venlafaxine XR (EFFEXOR-XR) 37.5 MG 24 hr capsule Take 1 capsule daily with breakfast.   zinc gluconate 50 MG tablet Take  daily.   No current facility-administered medications on file prior to visit.    Allergies  Allergen Reactions   Lipitor [Atorvastatin] Other (See Comments)    Muscle and legs aches   Shellfish Allergy Diarrhea and Nausea And Vomiting   Betadine [Povidone Iodine] Rash   Iodine     Pt unsure of reaction      Problem list She has Essential hypertension; Mixed hyperlipidemia; Prediabetes; Vitamin D deficiency;  DJD ; DDD, lumbar; Fibromyalgia Syndrome; Cystocele, midline; Obesity (BMI 30.0-34.9); Hypothyroidism; Malignant neoplasm of upper-outer quadrant of right breast in female, estrogen receptor positive ([place]); Tobacco chew use; Insomnia; Situational anxiety; Other spondylosis with radiculopathy, cervical region; Spinal stenosis of cervical region; and Cervical spinal stenosis on their problem list.   Observations/Objective:  There were no vitals taken for this visit.  HEENT - WNL. Neck - supple.  Chest - Clear equal BS. Cor - Nl HS. RRR w/o sig MGR. PP 1(+). No edema. MS- FROM w/o deformities.  Gait Nl. Neuro -  Nl w/o focal abnormalities.   Assessment and Plan:      Follow Up Instructions:        I discussed the assessment and treatment plan with the patient. The patient was provided an opportunity to ask questions and all were answered. The patient agreed with the plan and demonstrated an understanding of the instructions.       The patient was advised to call back or seek an in-person evaluation if the symptoms worsen or if the condition fails to improve as anticipated.    Kirtland Bouchard, MD

## 2023-02-12 ENCOUNTER — Encounter: Payer: Self-pay | Admitting: Internal Medicine

## 2023-02-12 ENCOUNTER — Ambulatory Visit (INDEPENDENT_AMBULATORY_CARE_PROVIDER_SITE_OTHER): Payer: Medicare Other | Admitting: Internal Medicine

## 2023-02-12 ENCOUNTER — Encounter: Payer: Self-pay | Admitting: Physician Assistant

## 2023-02-12 VITALS — BP 120/70 | HR 80 | Temp 97.9°F | Resp 17 | Ht 63.0 in | Wt 169.4 lb

## 2023-02-12 DIAGNOSIS — D229 Melanocytic nevi, unspecified: Secondary | ICD-10-CM | POA: Diagnosis not present

## 2023-02-15 ENCOUNTER — Encounter: Payer: Medicare Other | Admitting: Internal Medicine

## 2023-03-07 ENCOUNTER — Telehealth: Payer: Self-pay | Admitting: Physician Assistant

## 2023-03-07 NOTE — Telephone Encounter (Signed)
Inbound call from patient stating she has finished her second dose of medication. And she is still not having any relief. Requesting to speak with a nurse. Please advise.

## 2023-03-08 ENCOUNTER — Other Ambulatory Visit: Payer: Self-pay

## 2023-03-08 DIAGNOSIS — A0472 Enterocolitis due to Clostridium difficile, not specified as recurrent: Secondary | ICD-10-CM

## 2023-03-08 MED ORDER — FIDAXOMICIN 200 MG PO TABS: 200.0000 mg | ORAL_TABLET | Freq: Two times a day (BID) | ORAL | 0 refills | Status: DC

## 2023-03-08 NOTE — Telephone Encounter (Signed)
67 year old female history of IBS mixed, s/p cholecystectomy  normal colonoscopy 12/2019 for diarrhea, recall 12/2029  Tested positive Cdiff 01/31 treated vanc QID 14 days Continued diarrhea, tested positive 03/05, given vancomycin taper Patient was having improvement on her vancomycin QID but when she started to taper she had diarrhea again.  Most likely this represents continuing Cdiff infection, please work on getting the dificin 200mg  BID for 14 days Continue or start on florastor 250mg  1 cap BID for 1 month Needs follow up in our office 4-6 weeks Go to the ER if you are not peeing regularly, unable to take oral fluids, vomiting, severe weakness/dizziness, severe abdominal pain , chest pain or shortness of breath.   Will send to Dr. Myrtie Neither as well for any further recommendations.

## 2023-03-08 NOTE — Telephone Encounter (Signed)
Spoke with pt and she is aware of recommendations per Quentin Mulling PA and Dr. Myrtie Neither. Script sent to pharmacy. Pt scheduled to see Quentin Mulling PA 04/16/23 at 1:30pm. Referral in epic for ID. She knows she should get a call from their office regarding an appt.

## 2023-03-08 NOTE — Telephone Encounter (Signed)
I agree with the Dificid (fidaxomicin) as recommended.  Also, she needs a referral to Infectious Disease.  - HD

## 2023-03-08 NOTE — Telephone Encounter (Signed)
Pt states she finished her second round of vancomycin for cdiff 5 days ago. States when she was taking it QID she did have some solid BM's but once she started tapering the diarrhea started again. She is taking Imodium 3-4 per day to function. Please advise.

## 2023-03-20 ENCOUNTER — Other Ambulatory Visit (HOSPITAL_COMMUNITY): Payer: Self-pay

## 2023-03-20 ENCOUNTER — Ambulatory Visit: Payer: Medicare Other | Admitting: Internal Medicine

## 2023-03-20 ENCOUNTER — Encounter: Payer: Self-pay | Admitting: Internal Medicine

## 2023-03-20 ENCOUNTER — Other Ambulatory Visit: Payer: Self-pay

## 2023-03-20 VITALS — BP 126/85 | HR 84 | Resp 16 | Ht 63.0 in | Wt 170.0 lb

## 2023-03-20 DIAGNOSIS — A0472 Enterocolitis due to Clostridium difficile, not specified as recurrent: Secondary | ICD-10-CM | POA: Diagnosis not present

## 2023-03-20 NOTE — Patient Instructions (Signed)
We will work on procuring Dificid for you   Follow up in 4 weeks

## 2023-03-20 NOTE — Assessment & Plan Note (Signed)
  Will try to arrange for her to get Dificid  BID x 10 days through [employer] patient assistance.  Advised that I cannot provide further recommendations on efficacy of her plant based detox program but would recommend stopping at this time while procuring Dificid.  Additionally, her PCR testing can be somewhat challenging to interpret on the background of her GI history given they do not test for active toxin protein production and are capable of detecting asymptomatic carriers of C. Difficile.  Will follow up in about 4 weeks after getting Dificid treatment course to reassess clinically how she is doing.

## 2023-03-20 NOTE — Progress Notes (Signed)
[facility]  Reason for Consult: C diff  Referring Provider: Quentin Mulling   HPI:    Desiree Anderson is a 67 y.o. female with PMHx as below who presents to the clinic for C diff.   She has hx of mixed IBS, cholecystectomy, diarrhea who tested positive for C dff 12/27/22 (C diff toxin A/B detected on GI panel).  She was treated with Vancomycin  QID.  She had continued diarrhea and tested positive again on 01/30/23 via toxin PCR.  She was given Vancomycin taper and started having diarrhea again once she began to taper. GI prescribed Dificid  BID x 14 days on 03/07/23 and referred to ID.  She reports not taking Dificid due to co-pay of $1300.  She instead has been doing an over the counter plant based detox with imodium and dicyclomine.  She reports continued loose stool but overall some improvement.  She used this same process about 5 years ago when having diarrhea from radiation.   Patient's Medications  New Prescriptions   No medications on file  Previous Medications   ALPRAZOLAM (XANAX) 0.5 MG TABLET    TAKE 1/2 TO 1 (ONE-HALF TO ONE) TABLET BY MOUTH TWICE DAILY TO THREE TIMES DAILY AS NEEDED FOR ANXIETY *LIMIT TO 5 DAYS A WEEK*   ASCORBIC ACID (VITAMIN C) 1000 MG TABLET    Take 1,000 mg by mouth daily.   BLOOD GLUCOSE MONITORING SUPPL W/DEVICE KIT    Test blood sugar once daily or as directed.   CETIRIZINE (ZYRTEC) 10 MG TABLET    Take 10 mg by mouth daily as needed for allergies.   CHOLECALCIFEROL (VITAMIN D-3) 5000 UNITS TABS    Take 5,000 Units by mouth 2 (two) times daily.    COLESTIPOL (COLESTID) 1 G TABLET    Take 2 tablets by mouth twice daily   DICYCLOMINE (BENTYL) 20 MG TABLET    Take 1 tablet (20 mg total) by mouth 3 (three) times daily as needed for spasms.   EZETIMIBE (ZETIA) 10 MG TABLET    Take  1 tablet  Daily  for Cholesterol                                              /                    TAKE                        BY                   MOUTH                                  ONCE DAILY   FAMOTIDINE (PEPCID) 40 MG TABLET    Take 40 mg by mouth daily as needed for heartburn or indigestion.   FIDAXOMICIN (DIFICID) 200 MG TABS TABLET    Take 1 tablet (200 mg total) by mouth 2 (two) times daily.   FLUTICASONE (FLONASE) 50 MCG/ACT NASAL SPRAY    USE TWO SPRAY(S) IN EACH NOSTRIL ONCE DAILY AS NEEDED   GABAPENTIN (NEURONTIN) 600 MG TABLET    Take  1 tablet   3 x /day  for Chronic  Pain                                       /                         TAKE                              BY                  MOUTH   GLUCOSE BLOOD (FREESTYLE LITE) TEST STRIP    Test blood sugar once daily or as directed.   LANCETS MISC    Test blood sugar once daily or as directed.   LEVOTHYROXINE (SYNTHROID) 100 MCG TABLET    Take  1 tablet  Daily  on an empty stomach with only water  for 30 minutes & no Antacid meds, Calcium or Magnesium for 4 hours & avoid Biotin   LOPERAMIDE (IMODIUM) 2 MG CAPSULE    Take by mouth as needed for diarrhea or loose stools.   MECLIZINE (ANTIVERT) 25 MG TABLET    TAKE ONE-HALF TO ONE TABLET BY MOUTH UP TO THREE TIMES DAILY FOR MOTION SICKNESS/DIZZINESS   METFORMIN (GLUCOPHAGE-XR) 500 MG 24 HR TABLET    Take 2 tablets 2 x/ day with meals  for Diabetes   METHOCARBAMOL (ROBAXIN) 500 MG TABLET    Take 1 tablet (500 mg total) by mouth every 6 (six) hours as needed for muscle spasms.   ONDANSETRON (ZOFRAN) 4 MG TABLET    Take 1 tablet (4 mg total) by mouth daily as needed for nausea or vomiting.   POTASSIUM CHLORIDE SA (KLOR-CON M) 20 MEQ TABLET    Take  1 tablet  2 x /day  for Potassium                                                  /                                                           TAKE                                             BY                                              MOUTH   RIZATRIPTAN (MAXALT) 10 MG TABLET    Take 10 mg by mouth as needed for migraine. May repeat in 2 hours if needed   ROSUVASTATIN (CRESTOR) 10  MG TABLET    Take  1 tablet  Daily for Cholesterol                                           /  TAKE                                    BY                        MOUTH                          ONCE DAILY   TOPIRAMATE (TOPAMAX) 50 MG TABLET    Take  1 tablet  2 x /day  at Suppertime & Bedtime for Dieting & Weight loss                                               /                     TAKE 1/2 TO 1 TABLET BY MOUTH   VENLAFAXINE XR (EFFEXOR-XR) 37.5 MG 24 HR CAPSULE    Take 1 capsule (37.5 mg total) by mouth daily with breakfast.   ZINC GLUCONATE 50 MG TABLET    Take 50 mg by mouth daily.  Modified Medications   No medications on file  Discontinued Medications   No medications on file      Past Medical History:  Diagnosis Date   Anxiety    Breast cancer    Complication of anesthesia    hypotension   DDD (degenerative disc disease), lumbar    Diabetes mellitus without complication    Erosion of vaginal mesh    Fibromyalgia    GERD (gastroesophageal reflux disease)    History of gastric ulcer    History of radiation therapy 07/06/16- 08/22/16   Right Breast   Hypertension    Hypothyroidism    Personal history of radiation therapy    Rectocele     Social History   Tobacco Use   Smoking status: Former    Years: 20    Types: Cigarettes    Quit date: 11/28/1995    Years since quitting: 27.3   Smokeless tobacco: Current    Types: Snuff   Tobacco comments:    dips daily  Vaping Use   Vaping Use: Never used  Substance Use Topics   Alcohol use: No   Drug use: No    Family History  Problem Relation Age of Onset   Diabetes Mother    Diverticulitis Mother    Intestinal polyp Sister    Diverticulitis Sister    Lung cancer Brother    High blood pressure Brother    Liver disease Neg Hx    Colon cancer Neg Hx    Esophageal cancer Neg Hx     Allergies  Allergen Reactions   Lipitor [Atorvastatin] Other (See Comments)    Muscle and legs aches   Shellfish  Allergy Diarrhea and Nausea And Vomiting   Betadine [Povidone Iodine] Rash   Iodine     Pt unsure of reaction     Review of Systems  All other systems reviewed and are negative.   Except as noted above.   OBJECTIVE:    Vitals:   03/20/23 1449  BP: 126/85  Pulse: 84  Resp: 16  SpO2: 97%  Weight: 170 lb (77.1 kg)  Height:  (1.6 m)  Body mass index is 30.11 kg/m.  Physical Exam Constitutional:      Appearance: Normal appearance.  HENT:     Head: Normocephalic and atraumatic.  Eyes:     Extraocular Movements: Extraocular movements intact.     Conjunctiva/sclera: Conjunctivae normal.  Abdominal:     General: There is no distension.     Palpations: Abdomen is soft.  Skin:    General: Skin is warm and dry.  Neurological:     General: No focal deficit present.     Mental Status: She is alert and oriented to person, place, and time.  Psychiatric:        Mood and Affect: Mood normal.        Behavior: Behavior normal.      Labs and Microbiology:     Latest Ref Rng & Units 12/21/2022   12:28 PM 12/13/2022   12:00 AM 09/11/2022   10:45 AM  CBC  WBC 4.0 - 10.5 K/uL 8.3  7.8  10.7   Hemoglobin 12.0 - 15.0 g/dL 95.6  21.3  08.6   Hematocrit 36.0 - 46.0 % 41.2  40.9  43.6   Platelets 150.0 - 400.0 K/uL 293.0  325  360       Latest Ref Rng & Units 12/21/2022   12:28 PM 12/13/2022   12:00 AM 09/11/2022   10:45 AM  CMP  Glucose 70 - 99 mg/dL 91  92  578   BUN 6 - 23 mg/dL 7  10  10    Creatinine 0.40 - 1.20 mg/dL 4.69  6.29  5.28   Sodium 135 - 145 mEq/L 141  139  138   Potassium 3.5 - 5.1 mEq/L 3.6  4.9  4.0   Chloride 96 - 112 mEq/L 104  103  98   CO2 19 - 32 mEq/L 27  27  30    Calcium 8.4 - 10.5 mg/dL 9.0  9.4  9.6   Total Protein 6.0 - 8.3 g/dL 6.7  6.5  7.2   Total Bilirubin 0.2 - 1.2 mg/dL 0.3  0.4  0.5   Alkaline Phos 39 - 117 U/L 79     AST 0 - 37 U/L 34  25  26   ALT 0 - 35 U/L 34  24  27       ASSESSMENT & PLAN:    C. difficile  diarrhea  Will try to arrange for her to get Dificid 200mg  BID x 10 days through [employer] patient assistance.  Advised that I cannot provide further recommendations on efficacy of her plant based detox program but would recommend stopping at this time while procuring Dificid.  Additionally, her PCR testing can be somewhat challenging to interpret on the background of her GI history given they do not test for active toxin protein production and are capable of detecting asymptomatic carriers of C. Difficile.  Will follow up in about 4 weeks after getting Dificid treatment course to reassess clinically how she is doing.    No orders of the defined types were placed in this encounter.    Vedia Coffer for Infectious Disease [facility] 03/20/2023, 3:19 PM   I have personally spent 30 minutes involved in face-to-face and non-face-to-face activities for this patient on the day of the visit. Professional time spent includes the following activities: Preparing to see the patient (review of tests), Obtaining and/or reviewing separately obtained history (admission/discharge record), Performing a medically appropriate examination and/or evaluation , Ordering medications/tests/procedures, referring  and communicating with other health care professionals, Documenting clinical information in the EMR, Independently interpreting results (not separately reported), Communicating results to the patient/family/caregiver, Counseling and educating the patient/family/caregiver and Care coordination (not separately reported).

## 2023-03-22 ENCOUNTER — Telehealth: Payer: Self-pay

## 2023-03-22 NOTE — Telephone Encounter (Signed)
[facility] Patient Advocate Encounter  Completed and sent [employer] Patient Assistance application for Dificid for this patient who is uninsured.    Patient is approved 03/22/23 through 11/27/23.  I faxed the 1st 3 pages to the Pharmacy @ [phone].   Medication will be delivered to the patient home on 03/24/23.   Clearance Coots, CPhT Specialty Pharmacy Patient [facility] Phone: [phone] Fax:  [phone]

## 2023-03-23 NOTE — Telephone Encounter (Signed)
Thanks Lupita Leash!  Dr. Earlene Plater - [place] Childes

## 2023-03-27 ENCOUNTER — Encounter: Payer: Self-pay | Admitting: Internal Medicine

## 2023-03-27 NOTE — Progress Notes (Unsigned)
Annual Screening/Preventative Visit & Comprehensive Evaluation &  Examination  Future Appointments  Date Time Provider Department  03/28/2023                      cpe 10:00 AM Lucky Cowboy, MD [facility]  04/16/2023  1:30 PM Doree Albee, PA-C [facility]  04/19/2023  2:00 PM Vu, Tonita Phoenix, MD [facility]  12/18/2023                    wellness 11:00 AM Raynelle Dick, NP [facility]  04/03/2024                     cpe 10:00 AM Lucky Cowboy, MD [facility]  Had M/C Welcome Jan 2024 - Annabelle Harman       This very nice 66 y.o. MWF with  HTN, HLD, T2_NIDDM  and Vitamin D Deficiency  who presents for a Screening /Preventative Visit & comprehensive evaluation and management of multiple medical co-morbidities.         On 231 Jan, patient tested (+) for C.Diff & has been thru several treatment courses by Dr Myrtie Neither & Quentin Mulling , PA-C.                                                       Patient has been on SS Disability since 2005 for Fibromyalgia and Chronic Pain Syndrome from DDD/DJD and Interstitial Cystitis.          HTN predates since  2004. Patient has tapered her olmesartan due to low BPs - stopping 3 weeks ago & her BP has been controlled at home and patient denies any cardiac symptoms as chest pain, palpitations, shortness of breath, dizziness or ankle swelling. Today's BP is at goal 118/70.        Patient's hyperlipidemia is controlled with diet and Rosuvastatin /Ezetimibe. Patient denies myalgias or other medication SE's. Last lipids were at goal :  Lab Results  Component Value Date   CHOL 147 12/13/2022   HDL 64 12/13/2022   LDLCALC 61 12/13/2022   TRIG 137 12/13/2022   CHOLHDL 2.3 12/13/2022         Patient has  hx/o prediabetes (A1c 6.1% /2011) and then  T2_NIDDM (A1c 6.9% /Oct 2022)  and patient denies reactive hypoglycemic symptoms, visual blurring, diabetic polys or paresthesias. Last A1c was not at goal  as she apparently has stopped her Metformin :  Lab Results   Component Value Date   HGBA1C 6.1 (H) 09/11/2022                                           Patient has been on Thyroid Replacement since was dx'd Hypothyroid in 2002 .         Finally, patient has history of Vitamin D Deficiency ("11" /2008)  and last Vitamin D was at goal :  Lab Results  Component Value Date   VD25OH 64 09/11/2022       Current Outpatient Medications on File Prior to Visit  Medication Sig   ALPRAZolam (XANAX) 0.5 MG tablet TAKE 1/2 TO 1 TABLET BY MOUTH 2-3 TIMES DAILY ONLY IF NEEDED    VITAMIN  C 1000 MG tablet Take daily.   cetirizine  10 MG tablet Take 1 daily as needed for allergies.   VITAMIN D 5000 u  Take 2 times daily.    cholestyramine  4 g packet Take 1 packet  2 (times daily.   ezetimibe 10 MG tablet TAKE 1 TABLET DAILY    famotidine 40 MG tablet Take  daily as needed    FLONASE nasal spray USE 2 SPRAY EACH NOSTRIL DAILY    gabapentin  600 MG tablet TAKE 1/2 TO 1 TABLET  2 -3 x /DAILY AS  NEEDED     hyoscyamine SL 0.125 MG SL t DISSOLVE 1 TABLET EVERY 6 HOURS AS NEEDED   levothyroxine 100 MCG tablet Take  1 tablet  Daily     meclizine 25 MG tablet 1/2-1 pill up to 3 times daily    metFORMIN 500 MG tablet TAKE 2 TABLETS  TWICE DAILY    methocarbamol 500 MG tablet Take 1 tablet every 6  hours as needed   olmesartan 40 MG tablet Take  1 /2 to 1 tablet  Daily     oxyCODONE-APAP  5-325 MG  Take 1 tablet every 6 hours as needed for severe pain.   potassium 10 MEQ  capsule Take 2 capsules 2 x /day for Potassium   rizatriptan (MAXALT) 10 MG  Take as needed for migraine. May repeat in 2 hrs if needed   rosuvastatin  10 MG tablet Take  1 tablet  Daily    topiramate 50 MG tablet Take  1 tablet  2 x /day     venlafaxine XR  37.5 MG Take 1 capsule daily    Zinc 50 MG tablet Take daily.     Allergies  Allergen Reactions   Lipitor [Atorvastatin] Other (See Comments)    Muscle and legs aches   Shellfish Allergy Diarrhea and Nausea &Vomiting   Betadine  [Povidone Iodine] Rash     Past Medical History:  Diagnosis Date   Anxiety    Breast cancer (HCC)    Complication of anesthesia    hypotension   DDD (degenerative disc disease), lumbar    Diabetes mellitus without complication (HCC)    Erosion of vaginal mesh (HCC)    Fibromyalgia    GERD (gastroesophageal reflux disease)    History of gastric ulcer    History of radiation therapy 07/06/16- 08/22/16   Right Breast   Hypertension    Hypothyroidism    Personal history of radiation therapy    Rectocele      Health Maintenance  Topic Date Due   HIV Screening  Never done   Hepatitis C Screening  Never done   Zoster Vaccines- Shingrix (1 of 2) Never done   PAP SMEAR-Modifier  04/11/2019   Fecal DNA (Cologuard)  03/05/2020   COVID-19 Vaccine (3 - Pfizer risk series) 04/09/2020   TETANUS/TDAP  03/15/2021   MAMMOGRAM  08/06/2023   INFLUENZA VACCINE  Completed   HPV VACCINES  Aged Out     Immunization History  Administered Date(s) Administered   Influenza Inj Mdck Quad  09/04/2017, 09/30/2018, 09/15/2020   Influenza  09/18/2013, 09/25/2014, 10/21/2019   Influenza, Seasonal 10/11/2015   Influenza,inj,Quad  08/31/2021   Influenza,inj,quad 08/28/2016, 08/27/2017   Influenza 08/27/2016   PFIZER SARS-COV-2 Vacc 02/20/2020, 03/12/2020   Pneumococcal-23 11/27/2000   Tdap 03/16/2011    Cologard  03/05/2017 - Negative  Colon - 02.02.2021 - Dr Myrtie Neither -  Negative - recc 10 yr f/u -  due Feb 2031  Last MGM - 08/26/2021   Past Surgical History:  Procedure Laterality Date   ANTERIOR CERVICAL DECOMP/DISCECTOMY FUSION N/A 09/16/2021   Procedure: C5-6 ANTERIOR CERVICAL DISCECTOMY FUSION, ALLOGRAFT, PLATE;  Surgeon: Eldred Manges, MD;  Location: [facility] OR;  Service: Orthopedics;  Laterality: N/A;   BLADDER TACK  2003   BREAST BIOPSY Right 04/18/2016   BREAST LUMPECTOMY Right 05/18/2016   Breast mass removal Left 1985   Benign   CARDIOVASCULAR STRESS TEST  07-02-2013  DR DALTON MCLEAN    LOW RISK NUCLEAR STUDY WITH A SMALL, MILD APICAL SEPTAL FIXED PERFUSION DEFECT .  GIVEN NORMAL WALL FUNCTION , SUSPECT REPRESENTS ATTENUATION/  EF 74%/  NO ISCHEMIA   CATARACT EXTRACTION W/ INTRAOCULAR LENS IMPLANT Left 01/2014   CATARACT EXTRACTION W/ INTRAOCULAR LENS IMPLANT Right 12/2017   Dr. Sherryll Burger   CHOLECYSTECTOMY N/A 12/10/2017   Procedure: LAPAROSCOPIC CHOLECYSTECTOMY;  Surgeon: Emelia Loron, MD;  Location: [facility] OR;  Service: General;  Laterality: N/A;   CYSTO/  HYDRODISTENTION/  BLADDER BX/   INSTILLATION THERAPY  08-29-2010   EXCISION OF MESH N/A 03/23/2014   Procedure: EXCISION OF VAGINAL AND PERI-URETHRAL MESH, EXTRACTION FROM URETHRA TO APEX, insertion of XENFORM in the vaginal vault;  Surgeon: Kathi Ludwig, MD;  Location: [facility];  Service: Urology;  Laterality: N/A;   EXCISION VAGINAL CYST N/A 04/06/2014   Procedure: VAGINAL SPECULUM EXAMINATION/POSSIBLE DRAINAGE OF HEMATOMA;  Surgeon: Kathi Ludwig, MD;  Location: [facility];  Service: Urology;  Laterality: N/A;   INCONTINENCE SURGERY  2009  &  2005   RADIOACTIVE SEED GUIDED PARTIAL MASTECTOMY WITH AXILLARY SENTINEL LYMPH NODE BIOPSY Right 05/18/2016   Procedure: RADIOACTIVE SEED GUIDED RIGHT BREAST LUMPECTOMY WITH AXILLARY SENTINEL LYMPH NODE BIOPSY;  Surgeon: Emelia Loron, MD;  Location: [facility];  Service: General;  Laterality: Right;  RADIOACTIVE SEED GUIDED RIGHT BREAST LUMPECTOMY WITH AXILLARY SENTINEL LYMPH NODE BIOPSY   TRANSTHORACIC ECHOCARDIOGRAM  06-16-2013   GRADE I DIASTOLIC DYSFUNCTION/  EF 55-60%/  MILD AR/  TRIVIAL MR  &  TR   VAGINAL HYSTERECTOMY  2000   w/  unilateral salpingoophorectomy   VEIN BYPASS SURGERY  AGE 69   RIGHT LEG     Family History  Problem Relation Age of Onset   Diabetes Mother    Diverticulitis Mother    Lung cancer Brother    Intestinal polyp Sister    Diverticulitis Sister      Social History   Tobacco  Use   Smoking status: Former    Years: 20.00    Types: Cigarettes    Quit date: 11/28/1995    Years since quitting: 26.2   Smokeless tobacco: Current    Types: Snuff   Tobacco comments:    dips daily  Vaping Use   Vaping Use: Never used  Substance Use Topics   Alcohol use: No   Drug use: No      ROS Constitutional: Denies fever, chills, weight loss/gain, headaches, insomnia,  night sweats, and change in appetite. Does c/o fatigue. Eyes: Denies redness, blurred vision, diplopia, discharge, itchy, watery eyes.  ENT: Denies discharge, congestion, post nasal drip, epistaxis, sore throat, earache, hearing loss, dental pain, Tinnitus, Vertigo, Sinus pain, snoring.  Cardio: Denies chest pain, palpitations, irregular heartbeat, syncope, dyspnea, diaphoresis, orthopnea, PND, claudication, edema Respiratory: denies cough, dyspnea, DOE, pleurisy, hoarseness, laryngitis, wheezing.  Gastrointestinal: Denies dysphagia, heartburn, reflux, water brash, pain, cramps, nausea, vomiting, bloating, diarrhea, constipation, hematemesis, melena,  hematochezia, jaundice, hemorrhoids Genitourinary: Denies dysuria, frequency, urgency, nocturia, hesitancy, discharge, hematuria, flank pain Breast: Breast lumps, nipple discharge, bleeding.  Musculoskeletal: Denies arthralgia, myalgia, stiffness, Jt. Swelling, pain, limp, and strain/sprain. Denies falls. Skin: Denies puritis, rash, hives, warts, acne, eczema, changing in skin lesion Neuro: No weakness, tremor, incoordination, spasms, paresthesia, pain Psychiatric: Denies confusion, memory loss, sensory loss. Denies Depression. Endocrine: Denies change in weight, skin, hair change, nocturia, and paresthesia, diabetic polys, visual blurring, hyper / hypo glycemic episodes.  Heme/Lymph: No excessive bleeding, bruising, enlarged lymph nodes.  Physical Exam  There were no vitals taken for this visit.  General Appearance: Well nourished, well groomed and in no  apparent distress.  Eyes: PERRLA, EOMs, conjunctiva no swelling or erythema, normal fundi and vessels. Sinuses: No frontal/maxillary tenderness ENT/Mouth: EACs patent / TMs  nl. Nares clear without erythema, swelling, mucoid exudates. Oral hygiene is good. No erythema, swelling, or exudate. Tongue normal, non-obstructing. Tonsils not swollen or erythematous. Hearing normal.  Neck: Supple, thyroid not palpable. No bruits, nodes or JVD. Respiratory: Respiratory effort normal.  BS equal and clear bilateral without rales, rhonci, wheezing or stridor. Cardio: Heart sounds are normal with regular rate and rhythm and no murmurs, rubs or gallops. Peripheral pulses are normal and equal bilaterally without edema. No aortic or femoral bruits. Chest: symmetric with normal excursions and percussion. Breasts: Symmetric, without lumps, nipple discharge, retractions, or fibrocystic changes.  Abdomen: Flat, soft with bowel sounds active. Nontender, no guarding, rebound, hernias, masses, or organomegaly.  Lymphatics: Non tender without lymphadenopathy.  Genitourinary:  Musculoskeletal: Full ROM all peripheral extremities, joint stability, 5/5 strength, and normal gait. Skin: Warm and dry without rashes, lesions, cyanosis, clubbing or  ecchymosis.  Neuro: Cranial nerves intact, reflexes equal bilaterally. Normal muscle tone, no cerebellar symptoms. Sensation intact.  Pysch: Alert and oriented X 3, normal affect, Insight and Judgment appropriate.    Assessment and Plan  1. Annual Preventative Screening Examination   2. Essential hypertension  - Urinalysis, Routine w reflex microscopic - PR LOW EXTEMITY NEUR EXAM DOCUM - CBC with Differential/Platelet - COMPLETE METABOLIC PANEL WITH GFR - Magnesium - TSH - EKG 12-Lead - Microalbumin / creatinine urine ratio  3. Hyperlipidemia associated with type 2 diabetes mellitus (HCC)  - Lipid panel - TSH - EKG 12-Lead  4. Type 2 diabetes mellitus with  diabetic nephropathy,  without long-term current use of insulin (HCC)  - HM DIABETES FOOT EXAM - PR LOW EXTEMITY NEUR EXAM DOCUM - Hemoglobin A1c - Insulin, random - EKG 12-Lead - Microalbumin / creatinine urine ratio  5. Vitamin D deficiency  - VITAMIN D 25 Hydroxy (  6. Hypothyroidism  - TSH  7. Fibromyalgia Syndrome   8. Screening for colorectal cancer  - POC Hemoccult Bld/Stl  - EKG 12-Lead  9. Screening for heart disease   10. Former smoker  - EKG 12-Lead  11. Medication management  - Urinalysis, Routine w reflex microscopic - CBC with Differential/Platelet - COMPLETE METABOLIC PANEL WITH GFR - Magnesium - Lipid panel - TSH - Hemoglobin A1c - Insulin, random - VITAMIN D 25 Hydroxy  - Microalbumin / creatinine urine ratio         Patient was counseled in prudent diet to achieve/maintain BMI less than 25 for weight control, BP monitoring, regular exercise and medications. Discussed med's effects and SE's. Screening labs and tests as requested with regular follow-up as recommended. Over 40 minutes of exam, counseling, chart review and high complex critical decision making was performed.  Kirtland Bouchard, MD

## 2023-03-27 NOTE — Patient Instructions (Signed)

## 2023-03-28 ENCOUNTER — Ambulatory Visit (INDEPENDENT_AMBULATORY_CARE_PROVIDER_SITE_OTHER): Payer: Medicare Other | Admitting: Internal Medicine

## 2023-03-28 ENCOUNTER — Encounter: Payer: Self-pay | Admitting: Internal Medicine

## 2023-03-28 ENCOUNTER — Other Ambulatory Visit: Payer: Self-pay | Admitting: Internal Medicine

## 2023-03-28 VITALS — BP 136/84 | HR 83 | Temp 97.3°F | Resp 16 | Ht 63.0 in | Wt 171.8 lb

## 2023-03-28 DIAGNOSIS — Z0001 Encounter for general adult medical examination with abnormal findings: Secondary | ICD-10-CM

## 2023-03-28 DIAGNOSIS — Z0000 Encounter for general adult medical examination without abnormal findings: Secondary | ICD-10-CM | POA: Diagnosis not present

## 2023-03-28 DIAGNOSIS — E559 Vitamin D deficiency, unspecified: Secondary | ICD-10-CM

## 2023-03-28 DIAGNOSIS — E1121 Type 2 diabetes mellitus with diabetic nephropathy: Secondary | ICD-10-CM

## 2023-03-28 DIAGNOSIS — I10 Essential (primary) hypertension: Secondary | ICD-10-CM | POA: Diagnosis not present

## 2023-03-28 DIAGNOSIS — E039 Hypothyroidism, unspecified: Secondary | ICD-10-CM

## 2023-03-28 DIAGNOSIS — Z1211 Encounter for screening for malignant neoplasm of colon: Secondary | ICD-10-CM

## 2023-03-28 DIAGNOSIS — M797 Fibromyalgia: Secondary | ICD-10-CM

## 2023-03-28 DIAGNOSIS — E1169 Type 2 diabetes mellitus with other specified complication: Secondary | ICD-10-CM

## 2023-03-28 DIAGNOSIS — Z136 Encounter for screening for cardiovascular disorders: Secondary | ICD-10-CM | POA: Diagnosis not present

## 2023-03-28 DIAGNOSIS — Z79899 Other long term (current) drug therapy: Secondary | ICD-10-CM

## 2023-03-28 DIAGNOSIS — Z87891 Personal history of nicotine dependence: Secondary | ICD-10-CM

## 2023-03-28 LAB — CBC WITH DIFFERENTIAL/PLATELET
Eosinophils Relative: 1.6 %
HCT: 39.9 % (ref 35.0–45.0)
Hemoglobin: 12.9 g/dL (ref 11.7–15.5)
MCHC: 32.3 g/dL (ref 32.0–36.0)
MPV: 10.1 fL (ref 7.5–12.5)
Neutro Abs: 3511 cells/uL (ref 1500–7800)
RDW: 14 % (ref 11.0–15.0)
Total Lymphocyte: 49.8 %

## 2023-03-28 MED ORDER — TIRZEPATIDE 2.5 MG/0.5ML ~~LOC~~ SOAJ
SUBCUTANEOUS | 0 refills | Status: DC
Start: 2023-03-28 — End: 2023-07-06

## 2023-03-29 LAB — TSH: TSH: 0.36 mIU/L — ABNORMAL LOW (ref 0.40–4.50)

## 2023-03-29 LAB — LIPID PANEL
Cholesterol: 141 mg/dL (ref <200)
HDL: 62 mg/dL (ref >=50)
LDL Cholesterol (Calc): 59 mg/dL (calc)
Non-HDL Cholesterol (Calc): 79 mg/dL (calc) (ref <130)
Total CHOL/HDL Ratio: 2.3 (calc) (ref <5.0)
Triglycerides: 113 mg/dL (ref <150)

## 2023-03-29 LAB — URINALYSIS, ROUTINE W REFLEX MICROSCOPIC
Bilirubin Urine: NEGATIVE
Glucose, UA: NEGATIVE
Hgb urine dipstick: NEGATIVE
Ketones, ur: NEGATIVE
Leukocytes,Ua: NEGATIVE
Nitrite: NEGATIVE
Protein, ur: NEGATIVE
Specific Gravity, Urine: 1.002 (ref 1.001–1.035)
pH: 6 (ref 5.0–8.0)

## 2023-03-29 LAB — CBC WITH DIFFERENTIAL/PLATELET
Absolute Monocytes: 529 cells/uL (ref 200–950)
Basophils Absolute: 42 cells/uL (ref 0–200)
Basophils Relative: 0.5 %
Eosinophils Absolute: 134 cells/uL (ref 15–500)
Lymphs Abs: 4183 cells/uL — ABNORMAL HIGH (ref 850–3900)
MCH: 26.4 pg — ABNORMAL LOW (ref 27.0–33.0)
MCV: 81.6 fL (ref 80.0–100.0)
Monocytes Relative: 6.3 %
Neutrophils Relative %: 41.8 %
Platelets: 327 10*3/uL (ref 140–400)
RBC: 4.89 10*6/uL (ref 3.80–5.10)
WBC: 8.4 10*3/uL (ref 3.8–10.8)

## 2023-03-29 LAB — COMPLETE METABOLIC PANEL WITH GFR
AG Ratio: 1.9 (calc) (ref 1.0–2.5)
ALT: 22 U/L (ref 6–29)
AST: 20 U/L (ref 10–35)
Albumin: 4 g/dL (ref 3.6–5.1)
Alkaline phosphatase (APISO): 86 U/L (ref 37–153)
BUN: 7 mg/dL (ref 7–25)
CO2: 24 mmol/L (ref 20–32)
Calcium: 9.1 mg/dL (ref 8.6–10.4)
Chloride: 106 mmol/L (ref 98–110)
Creat: 0.7 mg/dL (ref 0.50–1.05)
Globulin: 2.1 g/dL (calc) (ref 1.9–3.7)
Glucose, Bld: 116 mg/dL — ABNORMAL HIGH (ref 65–99)
Potassium: 4.4 mmol/L (ref 3.5–5.3)
Sodium: 141 mmol/L (ref 135–146)
Total Bilirubin: 0.3 mg/dL (ref 0.2–1.2)
Total Protein: 6.1 g/dL (ref 6.1–8.1)
eGFR: 96 mL/min/{1.73_m2} (ref >=60)

## 2023-03-29 LAB — MICROALBUMIN / CREATININE URINE RATIO
Creatinine, Urine: 10 mg/dL — ABNORMAL LOW (ref 20–275)
Microalb Creat Ratio: 20 mg/g creat (ref <30)
Microalb, Ur: 0.2 mg/dL

## 2023-03-29 LAB — MAGNESIUM: Magnesium: 1.9 mg/dL (ref 1.5–2.5)

## 2023-03-29 LAB — INSULIN, RANDOM: Insulin: 56 u[IU]/mL — ABNORMAL HIGH

## 2023-03-29 LAB — HEMOGLOBIN A1C
Hgb A1c MFr Bld: 6.1 % of total Hgb — ABNORMAL HIGH (ref <5.7)
Mean Plasma Glucose: 128 mg/dL
eAG (mmol/L): 7.1 mmol/L

## 2023-03-29 LAB — VITAMIN D 25 HYDROXY (VIT D DEFICIENCY, FRACTURES): Vit D, 25-Hydroxy: 62 ng/mL (ref 30–100)

## 2023-04-06 ENCOUNTER — Other Ambulatory Visit: Payer: Self-pay | Admitting: Internal Medicine

## 2023-04-06 DIAGNOSIS — E1121 Type 2 diabetes mellitus with diabetic nephropathy: Secondary | ICD-10-CM

## 2023-04-06 MED ORDER — METFORMIN HCL ER 500 MG PO TB24
ORAL_TABLET | ORAL | 3 refills | Status: AC
Start: 2023-04-06 — End: ?

## 2023-04-12 NOTE — Progress Notes (Signed)
04/12/2023 Desiree Anderson [id number] [birth year]  Referring provider: Lucky Cowboy, MD Primary GI doctor: Dr. Myrtie Neither  ASSESSMENT AND PLAN:   IBS-Diarrhea/history of Cdiff 12/2019 colonoscopy negative for microscopic colitis Finished dificin 2 weeks ago Having 3-4 Bm's a day, more formed, less pain, states she feels much better, better with imodium Will get SIBO testing Continue Imodium can go up to 8 tablets a day or 16 mg total. Continue dicyclomine, FODMAP discussed Any worsening diarrhea get C. difficile toxin  Abnormal CT 01/17 normal colonoscopy 12/2019, recall 12/2029 12/13/2022 CT AB and pelvis for diarrhea and AB pain showed sigmoid colonic diverticulosis with wall thickening versus under distension of sigmoid colon may reflect mild colitis including segmental colitis associated diverticulosis given appearance Likely this was her Cdiff colitis, no pain, symptoms have improved No need for endoscopic evaluation at this time.  Hepatic steatosis --Continue to work on risk factor modification including diet exercise and control of risk factors including blood sugars. - monitor q 6 months.   Patient Care Team: Lucky Cowboy, MD as PCP - General (Internal Medicine) Pricilla Riffle, MD as Consulting Physician (Cardiology) Eldred Manges, MD as Consulting Physician (Orthopedic Surgery) Waynard Reeds, MD as Consulting Physician (Obstetrics and Gynecology) Magrinat, Valentino Hue, MD (Inactive) as Consulting Physician (Oncology) Emelia Loron, MD as Consulting Physician (General Surgery) Lurline Hare, MD as Consulting Physician (Radiation Oncology)  HISTORY OF PRESENT ILLNESS: 67 y.o. female with a past medical history of IBS mixed, status post cholecystectomy and others listed below presents for evaluation of AB pain and diarrhea.   11/25/2019 CT the abdomen pelvis with contrast for diarrhea LLQ pain was normal, diverticulosis, hepatic steatosis Stool studies were  normal.  Patient was given cholestyramine twice daily. Discussed that overall symptoms are consistent with IBS.   12/30/2019 colonoscopy for change in bowel habits and left lower abdominal pain and diarrhea up to 10 times a day.  Showed normal TI, normal mucosa, diverticulosis sigmoid colon, biopsies negative for microscopic colitis, recall 10 years  12/13/2022 office visit for Medicare wellness with PCP thought to have diverticulitis given Flagyl and Cipro for 14 days, left lower quadrant discomfort nausea and chills CBC showed no leukocytosis, no anemia, normal thyroid, normal kidney and liver 12/13/2022 CT abdomen pelvis with contrast for 3 to 4 weeks of left lower quadrant abdominal pain sigmoid colonic diverticulosis with wall thickening versus under tension of sigmoid colon may reflect mild colitis including segmental colitis associated diverticulosis given appearance would consider further evaluation with colonoscopy upon resolution of patient's current symptomology, diffuse hepatic steatosis, small hiatal hernia, aortic atherosclerosis.  12/21/2022 OV for diarrhea/nocturnal symptoms/fecal incontinence 12/27/2022 positive C. difficile treated with vancomycin 125 mg 4 times daily for 14 days Pancreatic elastase normal, negative celiac, no iron deficiency, normal sed rate, thyroid 01/30/23 positive C. difficile given vancomycin taper when she started taper started having diarrhea again. 03/08/2023 suggested Dificid 200 mg twice daily twice daily and referral to infectious disease. 03/20/2023  infection disease, arrange with marked patient assistance to get Dificid, follow-up there at the end of this month. She finished the dificid 2.5 weeks ago.   States the severe diarrhea and AB is gone however she continues to have to take imodium.  She states if she does not take the imodium, she has loose stools but not watery, more of a paste. Has pressure she has to have BM in rectum, and urgency.  She  continues to have to take imodium, take 1 every 1-2 days,  she is still on dicyclomine and levsin every few days.  She has not been taking may of the imodium due to fear of hurting herself, states she would like to go back up to the imodium two a day. That has helped her the most.  She has had 2 Bm's this morning and one since lunch, states normally has 10 or more before lunch.  She has not had any further nocturnal episodes, no nausea, vomiting, no fever, chills.  She has not started back on metformin, and she is not started mounjaro.   She  reports that she quit smoking about 27 years ago. Her smoking use included cigarettes. Her smokeless tobacco use includes snuff. She reports that she does not drink alcohol and does not use drugs.  Current Medications:   Current Outpatient Medications (Endocrine & Metabolic):    levothyroxine (SYNTHROID) 100 MCG tablet, Take  1 tablet  Daily  on an empty stomach with only water  for 30 minutes & no Antacid meds, Calcium or Magnesium for 4 hours & avoid Biotin   metFORMIN (GLUCOPHAGE-XR) 500 MG 24 hr tablet, Take 2 tablets 2 x/ day with meals  for Diabetes   tirzepatide (MOUNJARO) 2.5 MG/0.5ML Pen, Inject 1 pen (2.5 mg)   into  Skin e  very 7 days   for Diabetes     ( e11.9 )  Current Outpatient Medications (Cardiovascular):    colestipol (COLESTID) 1 g tablet, Take 2 tablets by mouth twice daily   ezetimibe (ZETIA) 10 MG tablet, Take  1 tablet  Daily  for Cholesterol                                              /                    TAKE                        BY                  MOUTH                                  ONCE DAILY   rosuvastatin (CRESTOR) 10 MG tablet, Take  1 tablet  Daily for Cholesterol                                           /                        TAKE                                    BY                        MOUTH                          ONCE DAILY  Current Outpatient Medications (Respiratory):    cetirizine (ZYRTEC) 10 MG tablet,  Take 10 mg  by mouth daily as needed for allergies.   fluticasone (FLONASE) 50 MCG/ACT nasal spray, USE TWO SPRAY(S) IN EACH NOSTRIL ONCE DAILY AS NEEDED  Current Outpatient Medications (Analgesics):    rizatriptan (MAXALT) 10 MG tablet, Take 10 mg by mouth as needed for migraine. May repeat in 2 hours if needed   Current Outpatient Medications (Other):    ALPRAZolam (XANAX) 0.5 MG tablet, TAKE 1/2 TO 1 (ONE-HALF TO ONE) TABLET BY MOUTH TWICE DAILY TO THREE TIMES DAILY AS NEEDED FOR ANXIETY *LIMIT TO 5 DAYS A WEEK*   Ascorbic Acid (VITAMIN C) 1000 MG tablet, Take 1,000 mg by mouth daily.   Blood Glucose Monitoring Suppl w/Device KIT, Test blood sugar once daily or as directed.   Cholecalciferol (VITAMIN D-3) 5000 UNITS TABS, Take 5,000 Units by mouth 2 (two) times daily.    dicyclomine (BENTYL) 20 MG tablet, Take 1 tablet (20 mg total) by mouth 3 (three) times daily as needed for spasms.   famotidine (PEPCID) 40 MG tablet, Take 40 mg by mouth daily as needed for heartburn or indigestion.   gabapentin (NEURONTIN) 600 MG tablet, Take  1 tablet   3 x /day  for Chronic Pain                                       /                         TAKE                              BY                  MOUTH   glucose blood (FREESTYLE LITE) test strip, Test blood sugar once daily or as directed.   Lancets MISC, Test blood sugar once daily or as directed.   loperamide (IMODIUM) 2 MG capsule, Take by mouth as needed for diarrhea or loose stools.   meclizine (ANTIVERT) 25 MG tablet, TAKE ONE-HALF TO ONE TABLET BY MOUTH UP TO THREE TIMES DAILY FOR MOTION SICKNESS/DIZZINESS   methocarbamol (ROBAXIN) 500 MG tablet, Take 1 tablet (500 mg total) by mouth every 6 (six) hours as needed for muscle spasms.   ondansetron (ZOFRAN) 4 MG tablet, Take 1 tablet (4 mg total) by mouth daily as needed for nausea or vomiting.   potassium chloride SA (KLOR-CON M) 20 MEQ tablet, Take  1 tablet  2 x /day  for Potassium                                                   /                                                           TAKE  BY                                              MOUTH   topiramate (TOPAMAX) 50 MG tablet, Take  1 tablet  2 x /day  at Suppertime & Bedtime for Dieting & Weight loss                                               /                     TAKE 1/2 TO 1 TABLET BY MOUTH   venlafaxine XR (EFFEXOR-XR) 37.5 MG 24 hr capsule, Take 1 capsule (37.5 mg total) by mouth daily with breakfast.   zinc gluconate 50 MG tablet, Take 50 mg by mouth daily.  Medical History:  Past Medical History:  Diagnosis Date   Anxiety    Breast cancer (HCC)    Complication of anesthesia    hypotension   DDD (degenerative disc disease), lumbar    Diabetes mellitus without complication (HCC)    Erosion of vaginal mesh (HCC)    Fibromyalgia    GERD (gastroesophageal reflux disease)    History of gastric ulcer    History of radiation therapy 07/06/16- 08/22/16   Right Breast   Hypertension    Hypothyroidism    Personal history of radiation therapy    Rectocele    Allergies:  Allergies  Allergen Reactions   Lipitor [Atorvastatin] Other (See Comments)    Muscle and legs aches   Shellfish Allergy Diarrhea and Nausea And Vomiting   Betadine [Povidone Iodine] Rash   Iodine     Pt unsure of reaction      Surgical History:  She  has a past surgical history that includes Incontinence surgery (2009  &  2005); BLADDER TACK (2003); Vein bypass surgery (AGE 76); transthoracic echocardiogram (06-16-2013); Cardiovascular stress test (07-02-2013  [facility]); CYSTO/  HYDRODISTENTION/  BLADDER BX/   INSTILLATION THERAPY (08-29-2010); Vaginal hysterectomy (2000); Cataract extraction w/ intraocular lens implant (Left, 01/2014); Excision of mesh (N/A, 03/23/2014); Excision vaginal cyst (N/A, 04/06/2014); Radioactive seed guided mastectomy with axillary sentinel lymph node biopsy (Right, 05/18/2016);  Breast lumpectomy (Right, 05/18/2016); Breast biopsy (Right, 04/18/2016); Cholecystectomy (N/A, 12/10/2017); Cataract extraction w/ intraocular lens implant (Right, 12/2017); Breast mass removal (Left, 1985); and Anterior cervical decomp/discectomy fusion (N/A, 09/16/2021). Family History:  Her family history includes Diabetes in her mother; Diverticulitis in her mother and sister; High blood pressure in her brother; Intestinal polyp in her sister; Lung cancer in her brother.  REVIEW OF SYSTEMS  : All other systems reviewed and negative except where noted in the History of Present Illness.  PHYSICAL EXAM: There were no vitals taken for this visit. General:   Pleasant, well developed female in no acute distress Head:   Normocephalic and atraumatic. Eyes:  sclerae anicteric,conjunctive pink  Heart:   regular rate and rhythm Pulm:  Clear anteriorly; no wheezing Abdomen:   Soft, Obese AB, Active bowel sounds. moderate tenderness in the LLQ. With guarding and Without rebound, No organomegaly appreciated. Rectal: Normal external rectal exam, decreased rectal tone with cavernous rectum, no internal hemorrhoids appreciated, no masses, non tender, scant brown stool, hemoccult Negative Extremities:  Without  edema. Msk: Symmetrical without gross deformities. Peripheral pulses intact.  Neurologic:  Alert and  oriented x4;  No focal deficits.  Skin:   Dry and intact without significant lesions or rashes. Psychiatric:  Cooperative. Normal mood and affect.  RELEVANT LABS AND IMAGING: CBC    Component Value Date/Time   WBC 8.4 03/28/2023 1011   RBC 4.89 03/28/2023 1011   HGB 12.9 03/28/2023 1011   HGB 12.1 07/11/2021 1123   HGB 13.6 07/03/2017 1241   HCT 39.9 03/28/2023 1011   HCT 41.9 07/03/2017 1241   PLT 327 03/28/2023 1011   PLT 241 07/11/2021 1123   PLT 257 07/03/2017 1241   MCV 81.6 03/28/2023 1011   MCV 85.3 07/03/2017 1241   MCH 26.4 (L) 03/28/2023 1011   MCHC 32.3 03/28/2023 1011   RDW  14.0 03/28/2023 1011   RDW 13.6 07/03/2017 1241   LYMPHSABS 4,183 (H) 03/28/2023 1011   LYMPHSABS 5.6 (H) 07/03/2017 1241   MONOABS 0.6 12/21/2022 1228   MONOABS 0.7 07/03/2017 1241   EOSABS 134 03/28/2023 1011   EOSABS 0.0 07/03/2017 1241   BASOSABS 42 03/28/2023 1011   BASOSABS 0.0 07/03/2017 1241    CMP     Component Value Date/Time   NA 141 03/28/2023 1011   NA 144 07/03/2017 1241   K 4.4 03/28/2023 1011   K 3.8 07/03/2017 1241   CL 106 03/28/2023 1011   CO2 24 03/28/2023 1011   CO2 27 07/03/2017 1241   GLUCOSE 116 (H) 03/28/2023 1011   GLUCOSE 142 (H) 07/03/2017 1241   BUN 7 03/28/2023 1011   BUN 11.2 07/03/2017 1241   CREATININE 0.70 03/28/2023 1011   CREATININE 0.9 07/03/2017 1241   CALCIUM 9.1 03/28/2023 1011   CALCIUM 9.2 07/03/2017 1241   PROT 6.1 03/28/2023 1011   PROT 6.7 07/03/2017 1241   ALBUMIN 4.2 12/21/2022 1228   ALBUMIN 3.7 07/03/2017 1241   AST 20 03/28/2023 1011   AST 63 (H) 07/11/2021 1123   AST 22 07/03/2017 1241   ALT 22 03/28/2023 1011   ALT 71 (H) 07/11/2021 1123   ALT 37 07/03/2017 1241   ALKPHOS 79 12/21/2022 1228   ALKPHOS 55 07/03/2017 1241   BILITOT 0.3 03/28/2023 1011   BILITOT 0.4 07/11/2021 1123   BILITOT 0.29 07/03/2017 1241   GFRNONAA >60 07/11/2021 1123   GFRNONAA 100 02/02/2021 1356   GFRAA 116 02/02/2021 1356     Doree Albee, PA-C 11:49 AM

## 2023-04-16 ENCOUNTER — Encounter: Payer: Self-pay | Admitting: Physician Assistant

## 2023-04-16 ENCOUNTER — Ambulatory Visit: Payer: Medicare Other | Admitting: Physician Assistant

## 2023-04-16 ENCOUNTER — Other Ambulatory Visit: Payer: Self-pay | Admitting: Nurse Practitioner

## 2023-04-16 VITALS — BP 130/80 | HR 92 | Ht 62.0 in | Wt 169.5 lb

## 2023-04-16 DIAGNOSIS — A0472 Enterocolitis due to Clostridium difficile, not specified as recurrent: Secondary | ICD-10-CM | POA: Diagnosis not present

## 2023-04-16 DIAGNOSIS — R1032 Left lower quadrant pain: Secondary | ICD-10-CM

## 2023-04-16 DIAGNOSIS — K760 Fatty (change of) liver, not elsewhere classified: Secondary | ICD-10-CM | POA: Diagnosis not present

## 2023-04-16 DIAGNOSIS — F419 Anxiety disorder, unspecified: Secondary | ICD-10-CM

## 2023-04-16 DIAGNOSIS — K580 Irritable bowel syndrome with diarrhea: Secondary | ICD-10-CM | POA: Diagnosis not present

## 2023-04-16 NOTE — Patient Instructions (Addendum)
You have been given a testing kit to check for small intestine bacterial overgrowth (SIBO) which is completed by a company named [facility]. Make sure to return your test in the mail using the return mailing label given to you along with the kit. The test order, your demographic and insurance information have all already been sent to the company. [facility] will collect an upfront charge of $99.74 for commercial insurance plans and $209.74 is you are paying cash. Make sure to discuss with [facility] PRIOR to having the test to see if they have gotten information from your insurance company as to how much your testing will cost out of pocket, if any. Please contact [facility] at phone number [phone] to get instructions regarding how to perform the test as our office is unable to give specific testing instructions.   Probiotics are microorganisms which may be beneficial to the function of the gastrointestinal tract and are used in several different GI conditions including Irritable Bowel Syndrome and following infections involving the GI tract.  Two commonly used probiotics are:  VSL#3 (Bifidobacterium breve, B. longum, B. infantis, Lactobacillus acidophilus, L. plantarum, L. paracasei, L. bulgaricus, Streptococcus thermophilus) and Align (B. Infantis).  I recommend you take one of these two probiotics once daily.  - Avoid high fat foods, as they can make diarrhea worse.  - Dairy products (except yogurt) may be difficult to digest when you have diarrhea. I recommend that you temporarily avoid lactose-containing foods.  - Can try loperamide 4 mg initially, then 2 mg after each unformed stool for =2 days, with a maximum of 16 mg/day or 8 mg daily. .    Go to the ER if any severe abdominal pain, fever, or weakness  We may want to evaluate you for small intestinal bacterial overgrowth, this can cause increase gas, bloating, loose stools or constipation.  There is a test for this  we can do or sometimes we will treat a patient with an antibiotic to see if it helps.   _______________________________________________________  If your blood pressure at your visit was 140/90 or greater, please contact your primary care physician to follow up on this.  _______________________________________________________  If you are age 63 or older, your body mass index should be between 23-30. Your Body mass index is 31 kg/m. If this is out of the aforementioned range listed, please consider follow up with your Primary Care Provider.  If you are age 52 or younger, your body mass index should be between 19-25. Your Body mass index is 31 kg/m. If this is out of the aformentioned range listed, please consider follow up with your Primary Care Provider.   ________________________________________________________  The  GI providers would like to encourage you to use [facility] to communicate with providers for non-urgent requests or questions.  Due to long hold times on the telephone, sending your provider a message by [facility] may be a faster and more efficient way to get a response.  Please allow 48 business hours for a response.  Please remember that this is for non-urgent requests.  _______________________________________________________ It was a pleasure to see you today!  Thank you for trusting me with your gastrointestinal care!

## 2023-04-18 ENCOUNTER — Other Ambulatory Visit: Payer: Self-pay | Admitting: Nurse Practitioner

## 2023-04-19 ENCOUNTER — Ambulatory Visit: Payer: Medicare Other | Admitting: Internal Medicine

## 2023-04-19 ENCOUNTER — Encounter: Payer: Self-pay | Admitting: Internal Medicine

## 2023-04-19 ENCOUNTER — Other Ambulatory Visit: Payer: Self-pay

## 2023-04-19 VITALS — BP 131/82 | HR 97 | Temp 98.4°F | Wt 171.0 lb

## 2023-04-19 DIAGNOSIS — R197 Diarrhea, unspecified: Secondary | ICD-10-CM | POA: Diagnosis not present

## 2023-04-19 DIAGNOSIS — K580 Irritable bowel syndrome with diarrhea: Secondary | ICD-10-CM

## 2023-04-19 NOTE — Progress Notes (Signed)
____________________________________________________________  Attending physician addendum:  Thank you for sending this case to me. I have reviewed the entire note and agree with the plan.  Sounds like she has some residual post-infectious diarrhea. If escalates such that she feels like when she had C diff, then stool studies for C diff PCR, toxin and GDH antigen.  That would allow [place] to determine if needs ID clinic again or a colonoscopy to evaluate for IBD or microscopic colitis.  Amada Jupiter, MD  ____________________________________________________________

## 2023-04-19 NOTE — Patient Instructions (Signed)
Avoid big meals  Consider holding metformin for the next week (ask your pcp about other meds for diabetic control for now)   Stool test   See [place] again 1-2 weeks after test done

## 2023-04-19 NOTE — Progress Notes (Signed)
[facility]  Reason for Consult: C diff  Referring Provider: Quentin Mulling   HPI:    Desiree Anderson is a 67 y.o. female with PMHx as below who presents to the clinic for C diff.   She has hx of mixed IBS, cholecystectomy, diarrhea who tested positive for C dff 12/27/22 (C diff toxin A/B detected on GI panel pcr).  She was treated with Vancomycin 125mg  QID.  She had continued diarrhea and tested positive again on 01/30/23 via toxin PCR .  She was given Vancomycin taper and started having diarrhea again once she began to taper. GI prescribed Dificid 200mg  BID x 14 days on 03/07/23 and referred to ID.  She reports not taking Dificid due to co-pay of $1300.  She instead has been doing an over the counter plant based detox with imodium and dicyclomine.  She reports continued loose stool but overall some improvement.  She used this same process about 5 years ago when having diarrhea from radiation.    --------- 5/23 id clinic f/u 03/20/23 id clinic dr Earlene Plater tried to give her 10 days difficid via pharm assistance He did mention pcr testing can be difficult to interpret. As it is not toxin and no plaque neutralization assay was done  Of note she never had leukocytosis at time of dx 11/2022 or since and had appearred rather clinically well  Noted she is on metformin, s/p cholecystectomy as well  Patient's Medications  New Prescriptions   No medications on file  Previous Medications   ALPRAZOLAM (XANAX) 0.5 MG TABLET    TAKE 1/2 TO 1 (ONE-HALF TO ONE) TABLET BY MOUTH ONCE TIMES DAILY AS NEEDED FOR ANXIETY LIMIT  TO  5  DAYS  A  WEEK   ASCORBIC ACID (VITAMIN C) 1000 MG TABLET    Take 1,000 mg by mouth daily.   BLOOD GLUCOSE MONITORING SUPPL W/DEVICE KIT    Test blood sugar once daily or as directed.   CETIRIZINE (ZYRTEC) 10 MG TABLET    Take 10 mg by mouth daily as needed for allergies.   CHOLECALCIFEROL (VITAMIN D-3) 5000 UNITS TABS    Take 5,000 Units by mouth 2  (two) times daily.    COLESTIPOL (COLESTID) 1 G TABLET    Take 2 tablets by mouth twice daily   EZETIMIBE (ZETIA) 10 MG TABLET    Take  1 tablet  Daily  for Cholesterol                                              /                    TAKE                        BY                  MOUTH                                  ONCE DAILY   FAMOTIDINE (PEPCID) 40 MG TABLET    Take 40 mg by mouth daily as needed for heartburn or indigestion.   FLUTICASONE (FLONASE) 50 MCG/ACT NASAL SPRAY    USE TWO SPRAY(S)  IN EACH NOSTRIL ONCE DAILY AS NEEDED   GABAPENTIN (NEURONTIN) 600 MG TABLET    Take  1 tablet   3 x /day  for Chronic Pain                                       /                         TAKE                              BY                  MOUTH   GLUCOSE BLOOD (FREESTYLE LITE) TEST STRIP    Test blood sugar once daily or as directed.   HYOSCYAMINE (LEVSIN SL) 0.125 MG SL TABLET    DISSOLVE 1 TABLET IN MOUTH EVERY 6 HOURS AS NEEDED   LANCETS MISC    Test blood sugar once daily or as directed.   LEVOTHYROXINE (SYNTHROID) 100 MCG TABLET    Take  1 tablet  Daily  on an empty stomach with only water  for 30 minutes & no Antacid meds, Calcium or Magnesium for 4 hours & avoid Biotin   LOPERAMIDE (IMODIUM) 2 MG CAPSULE    Take by mouth as needed for diarrhea or loose stools.   MECLIZINE (ANTIVERT) 25 MG TABLET    TAKE ONE-HALF TO ONE TABLET BY MOUTH UP TO THREE TIMES DAILY FOR MOTION SICKNESS/DIZZINESS   METFORMIN (GLUCOPHAGE-XR) 500 MG 24 HR TABLET    Take 2 tablets 2 x/ day with meals  for Diabetes   METHOCARBAMOL (ROBAXIN) 500 MG TABLET    Take 1 tablet (500 mg total) by mouth every 6 (six) hours as needed for muscle spasms.   ONDANSETRON (ZOFRAN) 4 MG TABLET    Take 1 tablet (4 mg total) by mouth daily as needed for nausea or vomiting.   POTASSIUM CHLORIDE SA (KLOR-CON M) 20 MEQ TABLET    Take  1 tablet  2 x /day  for Potassium                                                  /                                                            TAKE                                             BY                                              MOUTH   RIZATRIPTAN (MAXALT) 10 MG TABLET    Take 10 mg by mouth as  needed for migraine. May repeat in 2 hours if needed   ROSUVASTATIN (CRESTOR) 10 MG TABLET    Take  1 tablet  Daily for Cholesterol                                           /                        TAKE                                    BY                        MOUTH                          ONCE DAILY   TIRZEPATIDE (MOUNJARO) 2.5 MG/0.5ML PEN    Inject 1 pen (2.5 mg)   into  Skin e  very 7 days   for Diabetes     ( e11.9 )   TOPIRAMATE (TOPAMAX) 50 MG TABLET    Take  1 tablet  2 x /day  at Suppertime & Bedtime for Dieting & Weight loss                                               /                     TAKE 1/2 TO 1 TABLET BY MOUTH   VENLAFAXINE XR (EFFEXOR-XR) 37.5 MG 24 HR CAPSULE    Take 1 capsule (37.5 mg total) by mouth daily with breakfast.   ZINC GLUCONATE 50 MG TABLET    Take 50 mg by mouth daily.  Modified Medications   No medications on file  Discontinued Medications   No medications on file      Past Medical History:  Diagnosis Date   Anxiety    Breast cancer (HCC)    Complication of anesthesia    hypotension   DDD (degenerative disc disease), lumbar    Diabetes mellitus without complication (HCC)    Erosion of vaginal mesh (HCC)    Fibromyalgia    GERD (gastroesophageal reflux disease)    History of gastric ulcer    History of radiation therapy 07/06/16- 08/22/16   Right Breast   Hypertension    Hypothyroidism    Personal history of radiation therapy    Rectocele     Social History   Tobacco Use   Smoking status: Former    Years: 20    Types: Cigarettes    Quit date: 11/28/1995    Years since quitting: 27.4   Smokeless tobacco: Current    Types: Snuff   Tobacco comments:    dips daily  Vaping Use   Vaping Use: Never used  Substance Use Topics   Alcohol use: No   Drug  use: No    Family History  Problem Relation Age of Onset   Diabetes Mother    Diverticulitis Mother    Intestinal polyp Sister    Diverticulitis Sister    Lung cancer Brother  High blood pressure Brother    Liver disease Neg Hx    Colon cancer Neg Hx    Esophageal cancer Neg Hx     Allergies  Allergen Reactions   Lipitor [Atorvastatin] Other (See Comments)    Muscle and legs aches   Shellfish Allergy Diarrhea and Nausea And Vomiting   Betadine [Povidone Iodine] Rash   Iodine     Pt unsure of reaction     Review of Systems  All other systems reviewed and are negative.   Except as noted above.   OBJECTIVE:    Vitals:   04/19/23 1353  BP: 131/82  Pulse: 97  Temp: 98.4 F (36.9 C)  TempSrc: Oral  SpO2: 98%  Weight: 171 lb (77.6 kg)     Body mass index is 31.28 kg/m.  Physical Exam Constitutional:      Appearance: Normal appearance.  HENT:     Head: Normocephalic and atraumatic.  Eyes:     Extraocular Movements: Extraocular movements intact.     Conjunctiva/sclera: Conjunctivae normal.  Abdominal:     General: There is no distension.     Palpations: Abdomen is soft.  Skin:    General: Skin is warm and dry.  Neurological:     General: No focal deficit present.     Mental Status: She is alert and oriented to person, place, and time.  Psychiatric:        Mood and Affect: Mood normal.        Behavior: Behavior normal.      Labs and Microbiology:     Latest Ref Rng & Units 03/28/2023   10:11 AM 12/21/2022   12:28 PM 12/13/2022   12:00 AM  CBC  WBC 3.8 - 10.8 Thousand/uL 8.4  8.3  7.8   Hemoglobin 11.7 - 15.5 g/dL 09.8  11.9  14.7   Hematocrit 35.0 - 45.0 % 39.9  41.2  40.9   Platelets 140 - 400 Thousand/uL 327  293.0  325       Latest Ref Rng & Units 03/28/2023   10:11 AM 12/21/2022   12:28 PM 12/13/2022   12:00 AM  CMP  Glucose 65 - 99 mg/dL 829  91  92   BUN 7 - 25 mg/dL 7  7  10    Creatinine 0.50 - 1.05 mg/dL 5.62  1.30  8.65   Sodium 135  - 146 mmol/L 141  141  139   Potassium 3.5 - 5.3 mmol/L 4.4  3.6  4.9   Chloride 98 - 110 mmol/L 106  104  103   CO2 20 - 32 mmol/L 24  27  27    Calcium 8.6 - 10.4 mg/dL 9.1  9.0  9.4   Total Protein 6.1 - 8.1 g/dL 6.1  6.7  6.5   Total Bilirubin 0.2 - 1.2 mg/dL 0.3  0.3  0.4   Alkaline Phos 39 - 117 U/L  79    AST 10 - 35 U/L 20  34  25   ALT 6 - 29 U/L 22  34  24       ASSESSMENT & PLAN:      04/19/23 id assessment Diarrhea Cholecystectomy, diabetes on metformin, ibs   Never had true toxin assay Query if really cdiff  Question if truly improved with po vanc or dificid (had finished 10 days difficid about 3 weeks ago)  Taking immodium. Stool is somewhat formed with this. But also described nausea/dyspepsia and abd cramping sx ?chronic   Consider  holding metformin for now and use an alternative Low fat, smaller meals, and avoid dairy for the next week and see if that helps as well    Will do cdiff toxin and cytotoxic assay. If negative would advise w/u for other caused of diarrhea/dyspepsia    If positive test, not sure if she'll be benefitting from stool transplant but could consider referring to [facility] who does that   F/u 2-3 weeks after testing done

## 2023-04-19 NOTE — Addendum Note (Signed)
Addended by: Harley Alto on: 04/19/2023 02:40 PM   Modules accepted: Orders

## 2023-05-05 ENCOUNTER — Other Ambulatory Visit (HOSPITAL_COMMUNITY): Payer: Self-pay

## 2023-06-05 ENCOUNTER — Other Ambulatory Visit: Payer: Self-pay | Admitting: Nurse Practitioner

## 2023-06-05 DIAGNOSIS — F419 Anxiety disorder, unspecified: Secondary | ICD-10-CM

## 2023-06-27 NOTE — Progress Notes (Deleted)
3 MONTH FOLLOW UP  Assessment / Plan:   Desiree Anderson was seen today for 3 month follow-up   Diagnoses and all orders for this visit:  Essential hypertension Currently taking only Olmesartan 40 mg 1/2 tab daily and BP log showed control- continue current dosage Monitor blood pressure at home; call if consistently over 130/80 Continue DASH diet.   Reminder to go to the ER if any CP, SOB, nausea, dizziness, severe HA, changes vision/speech, left arm numbness and tingling and jaw pain. -     CBC with Differential/Platelet -     COMPLETE METABOLIC PANEL WITH GFR -     Magnesium  Hyperlipidemia, mixed Continue medications: Crestor 10mg  and zetia 10mg  Continue low cholesterol diet and exercise.  Check lipid panel.  -     Lipid panel  Fibromyalgia Pt is complaining of worsening symptoms, states muscle relaxants have helped in past Using Robaxin as needed Encouraged to restart Gabapentin for pain  Vitamin D deficiency Continue supplementation   Hypothyroidism, unspecified type Currently taking levothyroxine daily Instructed to take thyroid medication greater than 30 min before breakfast, separated by at least 4 hours  from antacids, calcium, iron, and multivitamins.    Gastroesophageal reflux disease, esophagitis presence not specified Doing well at this time Continue PPI/H2 blocker, diet discussed  Hypokalemia Taking Rx supplementation Will check labs  Anxiety Doing well on current regiment Taking Alprazolam 0.5mg  half tablet Discussed taking only PRN and limiting to 5 days a week to avoid addiction  Abnormal glucose Continue medication Discussed dietary and exercise modifications -     Hemoglobin A1c   Medication management -     CBC with Differential/Platelet -     COMPLETE METABOLIC PANEL WITH GFR -     Lipid panel -     TSH -     Hemoglobin A1c   History of breast cancer/ Malignant neoplasm of upper-outer quadrant of right breast in female, estrogen  receptor positive (HCC) Regular mammograms, follows with oncology Dr Desiree Anderson Has completed Tamoxifen  Insomnia, unspecified type Doing well at this time Discussed good sleep hygiene, decrease stimulation prior to sleep Increase day time activity Avoid caffeine in evenings Try OTC Melatonin 10mg  or benedryl       Over 30 minutes of interview exam, counseling, chart review and critical decision making was performed  Future Appointments  Date Time Provider Department Center  06/28/2023 10:30 AM Raynelle Dick, NP [facility] None  10/01/2023 10:30 AM Lucky Cowboy, MD [facility] None  01/02/2024 10:30 AM Raynelle Dick, NP [facility] None  04/03/2024 10:00 AM Lucky Cowboy, MD [facility] None      Subjective:  Desiree Anderson is a 67 y.o. female who presents for 3 month follow up on HTN, HLD, hypothyroidism, GERD, weight and vitamin D Deficency.  She has had sinus congestion, pressure, productive cough of yellow mucus. Feels feverish, myalgias and headaches. Symptoms have been present for 4 days. Covid and flu are negative today.  Also has history of breast cancer, right, s/p mastectomy (04/2016) with radiation and chemo.  She has finished her Tamoxifen and follows with Dr Desiree Anderson.  Last appointment was 07/09/19.  She had an ultrasound of right breast reagrding palpable changes.  Oil cysts noted as well as fat changes. 08/25/21 mammogram and biopsy which showed no malignancy  She has history of fibromyalgia and chronic pain from DDD/DJD. She had cervical fusion of C 5&6 in 08/2021 and pain is improved.  Had limited ROM to the right side.  Getting up twice during the night to use the bathroom.  Denies any dysuria, fevers or hematuria.   BMI is There is no height or weight on file to calculate BMI., she has not been working on diet and exercise. 9/27 weight was at a different office Wt Readings from Last 3 Encounters:  04/19/23 171 lb (77.6 kg)  04/16/23 169 lb 8 oz (76.9 kg)   03/28/23 171 lb 12.8 oz (77.9 kg)    HTN predates 2004.  Her blood pressure has been controlled at home, today their BP is   She does workout. She denies chest pain, shortness of breath, dizziness.   She is on cholesterol medication and denies myalgias. Her cholesterol is not at goal. The cholesterol last visit was:   Lab Results  Component Value Date   CHOL 157 07/17/2019   HDL 51 07/17/2019   LDLCALC 84 07/17/2019   TRIG 123 07/17/2019   CHOLHDL 3.1 07/17/2019    She has been working on diet and exercise for abnormal glucose/prediabetes, and denies hyperglycemia, hypoglycemia , nausea, polydipsia, polyuria and visual disturbances. Last A1C in the office was:  Lab Results  Component Value Date   HGBA1C 5.9 (H) 01/02/2019   Last GFR:   Lab Results  Component Value Date   GFRNONAA 96 01/02/2019   Lab Results  Component Value Date   GFRAA 111 01/02/2019   Patient is on Vitamin D supplement.  Deficiency noted in 09/2007.  Last check she was at goal. Lab Results  Component Value Date   VD25OH 69 01/02/2019     She has been on thyroid replacement since 2002. Currently taking levothyroxine daily.  Her medication was changed last visit. Lab Results  Component Value Date   TSH 0.29 (L) 04/15/2019      Medication Review: Current Outpatient Medications on File Prior to Visit  Medication Sig Dispense Refill   ALPRAZolam (XANAX) 0.5 MG tablet TAKE 1/2 TO 1 (ONE-HALF TO ONE) TABLET BY MOUTH ONCE DAILY FOR ANXIETY LIMIT TO 5 DAYS A WEEK 30 tablet 0   Ascorbic Acid (VITAMIN C) 1000 MG tablet Take 1,000 mg by mouth daily.     Blood Glucose Monitoring Suppl w/Device KIT Test blood sugar once daily or as directed. 1 kit 0   cetirizine (ZYRTEC) 10 MG tablet Take 10 mg by mouth daily as needed for allergies.     Cholecalciferol (VITAMIN D-3) 5000 UNITS TABS Take 5,000 Units by mouth 2 (two) times daily.      colestipol (COLESTID) 1 g tablet Take 2 tablets by mouth twice daily  120 tablet 0   ezetimibe (ZETIA) 10 MG tablet Take  1 tablet  Daily  for Cholesterol                                              /                    TAKE                        BY                  MOUTH  ONCE DAILY 90 tablet 3   famotidine (PEPCID) 40 MG tablet Take 40 mg by mouth daily as needed for heartburn or indigestion. (Patient not taking: Reported on 04/16/2023)     fluticasone (FLONASE) 50 MCG/ACT nasal spray USE TWO SPRAY(S) IN EACH NOSTRIL ONCE DAILY AS NEEDED 48 g 3   gabapentin (NEURONTIN) 600 MG tablet Take  1 tablet   3 x /day  for Chronic Pain                                       /                         TAKE                              BY                  MOUTH 270 tablet 3   glucose blood (FREESTYLE LITE) test strip Test blood sugar once daily or as directed. 100 each 12   hyoscyamine (LEVSIN SL) 0.125 MG SL tablet DISSOLVE 1 TABLET IN MOUTH EVERY 6 HOURS AS NEEDED 60 tablet 0   Lancets MISC Test blood sugar once daily or as directed. 200 each 11   levothyroxine (SYNTHROID) 100 MCG tablet Take  1 tablet  Daily  on an empty stomach with only water  for 30 minutes & no Antacid meds, Calcium or Magnesium for 4 hours & avoid Biotin 90 tablet 1   loperamide (IMODIUM) 2 MG capsule Take by mouth as needed for diarrhea or loose stools.     meclizine (ANTIVERT) 25 MG tablet TAKE ONE-HALF TO ONE TABLET BY MOUTH UP TO THREE TIMES DAILY FOR MOTION SICKNESS/DIZZINESS 30 tablet 0   metFORMIN (GLUCOPHAGE-XR) 500 MG 24 hr tablet Take 2 tablets 2 x/ day with meals  for Diabetes 360 tablet 3   methocarbamol (ROBAXIN) 500 MG tablet Take 1 tablet (500 mg total) by mouth every 6 (six) hours as needed for muscle spasms. 60 tablet 0   ondansetron (ZOFRAN) 4 MG tablet Take 1 tablet (4 mg total) by mouth daily as needed for nausea or vomiting. 30 tablet 1   potassium chloride SA (KLOR-CON M) 20 MEQ tablet Take  1 tablet  2 x /day  for Potassium                                                   /                                                           TAKE                                             BY  MOUTH 180 tablet 3   rizatriptan (MAXALT) 10 MG tablet Take 10 mg by mouth as needed for migraine. May repeat in 2 hours if needed     rosuvastatin (CRESTOR) 10 MG tablet Take  1 tablet  Daily for Cholesterol                                           /                        TAKE                                    BY                        MOUTH                          ONCE DAILY 90 tablet 3   tirzepatide (MOUNJARO) 2.5 MG/0.5ML Pen Inject 1 pen (2.5 mg)   into  Skin e  very 7 days   for Diabetes     ( e11.9 ) 2 mL 0   topiramate (TOPAMAX) 50 MG tablet Take  1 tablet  2 x /day  at Suppertime & Bedtime for Dieting & Weight loss                                               /                     TAKE 1/2 TO 1 TABLET BY MOUTH 180 tablet 3   venlafaxine XR (EFFEXOR-XR) 37.5 MG 24 hr capsule Take 1 capsule (37.5 mg total) by mouth daily with breakfast. 90 capsule 4   zinc gluconate 50 MG tablet Take 50 mg by mouth daily.     No current facility-administered medications on file prior to visit.    Allergies  Allergen Reactions   Lipitor [Atorvastatin] Other (See Comments)    Muscle and legs aches   Shellfish Allergy Diarrhea and Nausea And Vomiting   Betadine [Povidone Iodine] Rash   Iodine     Pt unsure of reaction     Current Problems (verified) Patient Active Problem List   Diagnosis Date Noted   C. difficile diarrhea 03/20/2023   Cervical spinal stenosis 09/16/2021   Spinal stenosis of cervical region 07/04/2021   Other spondylosis with radiculopathy, cervical region 03/29/2021   Insomnia 03/25/2018   Situational anxiety 03/25/2018   Tobacco chew use 05/15/2016   Malignant neoplasm of upper-outer quadrant of right breast in female, estrogen receptor positive (HCC) 04/21/2016   Hypothyroidism 04/18/2016   Obesity  (BMI 30.0-34.9) 10/11/2015   Cystocele, midline 03/23/2014   Essential hypertension 12/10/2013   Mixed hyperlipidemia 12/10/2013   Prediabetes 12/10/2013   Vitamin D deficiency 12/10/2013   DJD  12/10/2013   DDD, lumbar 12/10/2013   Fibromyalgia Syndrome 12/10/2013    Screening Tests Immunization History  Administered Date(s) Administered   Influenza Inj Mdck Quad With Preservative 09/04/2017, 09/30/2018, 09/15/2020   Influenza Split 09/18/2013, 09/25/2014, 10/21/2019   Influenza, Seasonal, Injecte, Preservative Fre  10/11/2015   Influenza,inj,Quad PF,6+ Mos 08/31/2021, 09/11/2022   Influenza,inj,quad, With Preservative 08/28/2016, 08/27/2017   Influenza-Unspecified 08/27/2016   PFIZER(Purple Top)SARS-COV-2 Vaccination 02/20/2020, 03/12/2020   Pneumococcal-Unspecified 11/27/2000   Tdap 03/16/2011    Preventative care: Last colonoscopy:2021 repeat due 2031 Last mammogram: 07/2021 mammogram and U/S with biopsy 08/24/21 revealed scar tissue Last pap smear/pelvic exam: 2017   DEXA: At age age 62years  Prior vaccinations: TD or Tdap: 2012  Influenza: 09/2019, received today Pneumococcal: 2002 Prevnar13: Due at age 55 Shingles/Zostavax: DUE, discussed Shingrix   Names of Other Physician/Practitioners you currently use: 1. [facility] here for primary care 2. Eye Exam Dr Sherryll Burger 2021 3. Dental Exam Dr Jeanice Lim 2022  Patient Care Team: Lucky Cowboy, MD as PCP - General (Internal Medicine) Pricilla Riffle, MD as Consulting Physician (Cardiology) Eldred Manges, MD as Consulting Physician (Orthopedic Surgery) Waynard Reeds, MD as Consulting Physician (Obstetrics and Gynecology) Magrinat, Valentino Hue, MD (Inactive) as Consulting Physician (Oncology) Emelia Loron, MD as Consulting Physician (General Surgery) Lurline Hare, MD as Consulting Physician (Radiation Oncology)  SURGICAL HISTORY She  has a past surgical history that includes  Incontinence surgery (2009  &  2005); BLADDER TACK (2003); Vein bypass surgery (AGE 35); transthoracic echocardiogram (06-16-2013); Cardiovascular stress test (07-02-2013  [facility]); CYSTO/  HYDRODISTENTION/  BLADDER BX/   INSTILLATION THERAPY (08-29-2010); Vaginal hysterectomy (2000); Cataract extraction w/ intraocular lens implant (Left, 01/2014); Excision of mesh (N/A, 03/23/2014); Excision vaginal cyst (N/A, 04/06/2014); Radioactive seed guided mastectomy with axillary sentinel lymph node biopsy (Right, 05/18/2016); Breast lumpectomy (Right, 05/18/2016); Breast biopsy (Right, 04/18/2016); Cholecystectomy (N/A, 12/10/2017); Cataract extraction w/ intraocular lens implant (Right, 12/2017); Breast mass removal (Left, 1985); and Anterior cervical decomp/discectomy fusion (N/A, 09/16/2021). FAMILY HISTORY Her family history includes Diabetes in her mother; Diverticulitis in her mother and sister; High blood pressure in her brother; Intestinal polyp in her sister; Lung cancer in her brother. SOCIAL HISTORY She  reports that she quit smoking about 27 years ago. Her smoking use included cigarettes. She started smoking about 47 years ago. Her smokeless tobacco use includes snuff. She reports that she does not drink alcohol and does not use drugs.   Review of Systems  Constitutional:  Positive for chills. Negative for fever.  HENT:  Positive for congestion and sinus pain. Negative for hearing loss, sore throat and tinnitus.   Eyes:  Negative for blurred vision and double vision.  Respiratory:  Positive for cough (Productive of yellow mucus). Negative for hemoptysis, sputum production, shortness of breath and wheezing.   Cardiovascular:  Negative for chest pain, palpitations and leg swelling.  Gastrointestinal:  Negative for abdominal pain, constipation, diarrhea (rare), heartburn, nausea and vomiting.  Genitourinary:  Negative for dysuria and urgency.  Musculoskeletal:  Positive for back pain, myalgias  and neck pain. Negative for falls and joint pain.  Skin:  Negative for rash.  Neurological:  Positive for tingling (down arms bilaterally). Negative for dizziness, tremors, weakness and headaches.  Endo/Heme/Allergies:  Does not bruise/bleed easily.  Psychiatric/Behavioral:  Negative for depression and suicidal ideas. The patient has insomnia. The patient is not nervous/anxious.      Objective:     There were no vitals filed for this visit.   There is no height or weight on file to calculate BMI.  General appearance: alert, no distress, WD/WN, female HEENT: normocephalic, sclerae anicteric, TMs pearly, nares patent, erythema and yellow mucus noted, pharynx normal Oral cavity: MMM, no lesions Sinuses: positive frontal  sinus tenderness Neck: supple, no lymphadenopathy, no thyromegaly, no masses Heart: RRR, normal S1, S2, no murmurs Lungs: CTA bilaterally, no wheezes, rhonchi, or rales Abdomen: +bs, soft, non tender, non distended, no masses, no hepatomegaly, no splenomegaly Musculoskeletal: nontender, no swelling, no obvious deformity Extremities: no edema, no cyanosis, no clubbing Pulses: 2+ symmetric, upper and lower extremities, normal cap refill Neurological: alert, oriented x 3, CN2-12 intact, strength normal upper extremities and lower extremities, sensation normal throughout, DTRs 2+ throughout, no cerebellar signs, gait normal Psychiatric: normal affect, behavior normal, pleasant   [facility]  06/27/2023

## 2023-06-28 ENCOUNTER — Ambulatory Visit: Payer: Medicare Other | Admitting: Nurse Practitioner

## 2023-06-28 DIAGNOSIS — E669 Obesity, unspecified: Secondary | ICD-10-CM

## 2023-06-28 DIAGNOSIS — R7309 Other abnormal glucose: Secondary | ICD-10-CM

## 2023-06-28 DIAGNOSIS — G4700 Insomnia, unspecified: Secondary | ICD-10-CM

## 2023-06-28 DIAGNOSIS — E039 Hypothyroidism, unspecified: Secondary | ICD-10-CM

## 2023-06-28 DIAGNOSIS — Z170 Estrogen receptor positive status [ER+]: Secondary | ICD-10-CM

## 2023-06-28 DIAGNOSIS — M797 Fibromyalgia: Secondary | ICD-10-CM

## 2023-06-28 DIAGNOSIS — E782 Mixed hyperlipidemia: Secondary | ICD-10-CM

## 2023-06-28 DIAGNOSIS — Z79899 Other long term (current) drug therapy: Secondary | ICD-10-CM

## 2023-06-28 DIAGNOSIS — K219 Gastro-esophageal reflux disease without esophagitis: Secondary | ICD-10-CM

## 2023-06-28 DIAGNOSIS — I10 Essential (primary) hypertension: Secondary | ICD-10-CM

## 2023-06-28 DIAGNOSIS — E559 Vitamin D deficiency, unspecified: Secondary | ICD-10-CM

## 2023-07-03 ENCOUNTER — Other Ambulatory Visit: Payer: Self-pay | Admitting: Nurse Practitioner

## 2023-07-03 ENCOUNTER — Telehealth: Payer: Self-pay | Admitting: Nurse Practitioner

## 2023-07-03 DIAGNOSIS — G43109 Migraine with aura, not intractable, without status migrainosus: Secondary | ICD-10-CM

## 2023-07-03 DIAGNOSIS — G43009 Migraine without aura, not intractable, without status migrainosus: Secondary | ICD-10-CM

## 2023-07-03 MED ORDER — RIZATRIPTAN BENZOATE 10 MG PO TABS
10.0000 mg | ORAL_TABLET | ORAL | 1 refills | Status: AC | PRN
Start: 2023-07-03 — End: ?

## 2023-07-03 MED ORDER — MECLIZINE HCL 25 MG PO TABS: ORAL_TABLET | ORAL | 0 refills | Status: AC

## 2023-07-03 NOTE — Telephone Encounter (Signed)
Patient is requesting refills on Meclizine and Rizatriptan to [facility] on [address].

## 2023-07-05 NOTE — Progress Notes (Signed)
3 MONTH FOLLOW UP  Assessment / Plan:   Desiree Anderson was seen today for 3 month follow-up   Diagnoses and all orders for this visit:  Essential hypertension Currently controlled without medication Monitor blood pressure at home; call if consistently over 130/80 Continue DASH diet.   Reminder to go to the ER if any CP, SOB, nausea, dizziness, severe HA, changes vision/speech, left arm numbness and tingling and jaw pain. -     CBC with Differential/Platelet -     COMPLETE METABOLIC PANEL WITH GFR -     Magnesium  Hyperlipidemia, mixed Continue medications: Crestor 10mg  and zetia 10mg  Continue low cholesterol diet and exercise.  Check lipid panel.  -     Lipid panel  Fibromyalgia Pt is complaining of worsening symptoms, Using Robaxin and Gabapentin with moderate relief of pain   Vitamin D deficiency Continue supplementation   Hypothyroidism, unspecified type Currently taking levothyroxine daily Instructed to take thyroid medication greater than 30 min before breakfast, separated by at least 4 hours  from antacids, calcium, iron, and multivitamins.  -TSH  Gastroesophageal reflux disease, esophagitis presence not specified Doing well at this time Continue PPI/H2 blocker, diet discussed  Anxiety Doing well on current regiment Taking Alprazolam 0.5mg  half tablet Discussed taking only PRN and limiting to 5 days a week to avoid addiction. Last filled on 06/07/22 #20  Abnormal glucose Continue medication Discussed dietary and exercise modifications -     Hemoglobin A1c   Medication management -     CBC with Differential/Platelet -     COMPLETE METABOLIC PANEL WITH GFR -     Lipid panel -     TSH -     Magnesium  Obesity Long discussion about weight loss, diet, and exercise Recommended diet heavy in fruits and veggies and low in animal meats, cheeses, and dairy products, appropriate calorie intake Patient will work on decreasing saturated fats and simple carbs.   Increase lean proteins and exercise Follow up at next visit   History of breast cancer/ Malignant neoplasm of upper-outer quadrant of right breast in female, estrogen receptor positive (HCC) Regular mammograms, follows with oncology Dr Edison Simon Has completed Tamoxifen  Insomnia, unspecified type Doing well at this time Discussed good sleep hygiene, decrease stimulation prior to sleep Increase day time activity Avoid caffeine in evenings Try OTC Melatonin 10mg  or benedryl   Osteoarthritis of multiple joints Continue Tylenol , heat and Gabapentin as needed Continue to follow with Dr. Ophelia Charter, orthopedics    Over 30 minutes of interview exam, counseling, chart review and critical decision making was performed  Future Appointments  Date Time Provider Department Center  10/01/2023 10:30 AM Lucky Cowboy, MD [facility] None  01/02/2024 10:30 AM Raynelle Dick, NP [facility] None  04/03/2024 10:00 AM Lucky Cowboy, MD [facility] None      Subjective:  Desiree Anderson is a 67 y.o. female who presents for 3 month follow up on HTN, HLD, hypothyroidism, GERD, weight and vitamin D Deficency.   Also has history of breast cancer, right, s/p mastectomy (04/2016) with radiation and chemo.  She has finished her Tamoxifen, no longer follows with oncology.  Last appointment was 07/09/19.  She had an ultrasound of right breast reagrding palpable changes.  Oil cysts noted as well as fat changes. 10/04/22 mammogram  She has history of fibromyalgia and chronic pain from DDD/DJD. She had cervical fusion of C 5&6 in 08/2021 and pain is improved.  Had limited ROM to the right side. Pain  is controlled if does not do too much activity.   She has been having some knee and feet pain when going down stairs. Pain is not nearly as bad when walking on flat surfaces. Gabapentin does help. She does follow with orthopedics.  Getting up twice during the night to use the bathroom.  Denies any dysuria, fevers or  hematuria.  Stress is increased and is using Effexor 37.5 mg every day and Alprazolam 0.5 mg sparingly.  Symptoms are well controlled.  Her migraines are more frequent since the stress has increased in her life with Topamax for prevention and Rizatriptan as needed. She has also been using Tylenol.   She continues to have diarrhea frequently.  She is being evaluated through GI.   BMI is Body mass index is 32.56 kg/m., she has not been working on diet and exercise. She has not been watching her diet recently- under a lot of stress.  Wt Readings from Last 3 Encounters:  07/06/23 178 lb (80.7 kg)  04/19/23 171 lb (77.6 kg)  04/16/23 169 lb 8 oz (76.9 kg)    HTN predates 2004.  Her blood pressure has been controlled at home without medication, today their BP is BP: 124/76 BP Readings from Last 3 Encounters:  07/06/23 124/76  04/19/23 131/82  04/16/23 130/80  She does workout. She denies chest pain, shortness of breath, dizziness.    She is on cholesterol medication, rosuvastatin 10 mg every day and denies myalgias. Her cholesterol is at goal. The cholesterol last visit was:   Lab Results  Component Value Date   CHOL 141 03/28/2023   HDL 62 03/28/2023   LDLCALC 59 03/28/2023   TRIG 113 03/28/2023   CHOLHDL 2.3 03/28/2023     She has been working on diet and exercise for abnormal glucose/prediabetes,she is currently on Metformin 500 mg 2 tabs PO BID. and denies hyperglycemia, hypoglycemia , nausea, polydipsia, polyuria and visual disturbances. Last A1C in the office was:  Lab Results  Component Value Date   HGBA1C 6.1 (H) 03/28/2023    Last GFR: Lab Results  Component Value Date   EGFR 96 03/28/2023     Patient is on Vitamin D supplement.  Deficiency noted in 09/2007.  Last check she was at goal. Lab Results  Component Value Date   VD25OH 42 03/28/2023        She has been on thyroid replacement since 2002. Currently taking levothyroxine daily.  Her medication was  changed last visit. Lab Results  Component Value Date   TSH 0.36 (L) 03/28/2023       Medication Review: Current Outpatient Medications on File Prior to Visit  Medication Sig Dispense Refill   ALPRAZolam (XANAX) 0.5 MG tablet TAKE 1/2 TO 1 (ONE-HALF TO ONE) TABLET BY MOUTH ONCE DAILY FOR ANXIETY LIMIT TO 5 DAYS A WEEK 30 tablet 0   Ascorbic Acid (VITAMIN C) 1000 MG tablet Take 1,000 mg by mouth daily.     Blood Glucose Monitoring Suppl w/Device KIT Test blood sugar once daily or as directed. 1 kit 0   cetirizine (ZYRTEC) 10 MG tablet Take 10 mg by mouth daily as needed for allergies.     Cholecalciferol (VITAMIN D-3) 5000 UNITS TABS Take 5,000 Units by mouth 2 (two) times daily.      colestipol (COLESTID) 1 g tablet Take 2 tablets by mouth twice daily 120 tablet 0   ezetimibe (ZETIA) 10 MG tablet Take  1 tablet  Daily  for Cholesterol                                              /  TAKE                        BY                  MOUTH                                  ONCE DAILY 90 tablet 3   famotidine (PEPCID) 40 MG tablet Take 40 mg by mouth daily as needed for heartburn or indigestion. (Patient not taking: Reported on 04/16/2023)     fluticasone (FLONASE) 50 MCG/ACT nasal spray USE TWO SPRAY(S) IN EACH NOSTRIL ONCE DAILY AS NEEDED 48 g 3   gabapentin (NEURONTIN) 600 MG tablet Take  1 tablet   3 x /day  for Chronic Pain                                       /                         TAKE                              BY                  MOUTH 270 tablet 3   glucose blood (FREESTYLE LITE) test strip Test blood sugar once daily or as directed. 100 each 12   hyoscyamine (LEVSIN SL) 0.125 MG SL tablet DISSOLVE 1 TABLET IN MOUTH EVERY 6 HOURS AS NEEDED 60 tablet 0   Lancets MISC Test blood sugar once daily or as directed. 200 each 11   levothyroxine (SYNTHROID) 100 MCG tablet Take  1 tablet  Daily  on an empty stomach with only water  for 30 minutes & no Antacid meds, Calcium or  Magnesium for 4 hours & avoid Biotin 90 tablet 1   loperamide (IMODIUM) 2 MG capsule Take by mouth as needed for diarrhea or loose stools.     meclizine (ANTIVERT) 25 MG tablet TAKE ONE-HALF TO ONE TABLET BY MOUTH UP TO THREE TIMES DAILY FOR MOTION SICKNESS/DIZZINESS 30 tablet 0   metFORMIN (GLUCOPHAGE-XR) 500 MG 24 hr tablet Take 2 tablets 2 x/ day with meals  for Diabetes 360 tablet 3   methocarbamol (ROBAXIN) 500 MG tablet Take 1 tablet (500 mg total) by mouth every 6 (six) hours as needed for muscle spasms. 60 tablet 0   ondansetron (ZOFRAN) 4 MG tablet Take 1 tablet (4 mg total) by mouth daily as needed for nausea or vomiting. 30 tablet 1   potassium chloride SA (KLOR-CON M) 20 MEQ tablet Take  1 tablet  2 x /day  for Potassium                                                  /  TAKE                                             BY                                              MOUTH 180 tablet 3   rizatriptan (MAXALT) 10 MG tablet Take 1 tablet (10 mg total) by mouth as needed for migraine. May repeat in 2 hours if needed 10 tablet 1   rosuvastatin (CRESTOR) 10 MG tablet Take  1 tablet  Daily for Cholesterol                                           /                        TAKE                                    BY                        MOUTH                          ONCE DAILY 90 tablet 3   tirzepatide (MOUNJARO) 2.5 MG/0.5ML Pen Inject 1 pen (2.5 mg)   into  Skin e  very 7 days   for Diabetes     ( e11.9 ) 2 mL 0   topiramate (TOPAMAX) 50 MG tablet Take  1 tablet  2 x /day  at Suppertime & Bedtime for Dieting & Weight loss                                               /                     TAKE 1/2 TO 1 TABLET BY MOUTH 180 tablet 3   venlafaxine XR (EFFEXOR-XR) 37.5 MG 24 hr capsule Take 1 capsule (37.5 mg total) by mouth daily with breakfast. 90 capsule 4   zinc gluconate 50 MG tablet Take 50 mg by mouth daily.     No current  facility-administered medications on file prior to visit.    Allergies  Allergen Reactions   Lipitor [Atorvastatin] Other (See Comments)    Muscle and legs aches   Shellfish Allergy Diarrhea and Nausea And Vomiting   Betadine [Povidone Iodine] Rash   Iodine     Pt unsure of reaction     Current Problems (verified) Patient Active Problem List   Diagnosis Date Noted   C. difficile diarrhea 03/20/2023   Cervical spinal stenosis 09/16/2021   Spinal stenosis of cervical region 07/04/2021   Other spondylosis with radiculopathy, cervical region 03/29/2021   Insomnia 03/25/2018   Situational anxiety 03/25/2018   Tobacco chew use 05/15/2016   Malignant neoplasm of upper-outer quadrant of  right breast in female, estrogen receptor positive (HCC) 04/21/2016   Hypothyroidism 04/18/2016   Obesity (BMI 30.0-34.9) 10/11/2015   Cystocele, midline 03/23/2014   Essential hypertension 12/10/2013   Mixed hyperlipidemia 12/10/2013   Prediabetes 12/10/2013   Vitamin D deficiency 12/10/2013   DJD  12/10/2013   DDD, lumbar 12/10/2013   Fibromyalgia Syndrome 12/10/2013    Screening Tests Immunization History  Administered Date(s) Administered   Influenza Inj Mdck Quad With Preservative 09/04/2017, 09/30/2018, 09/15/2020   Influenza Split 09/18/2013, 09/25/2014, 10/21/2019   Influenza, Seasonal, Injecte, Preservative Fre 10/11/2015   Influenza,inj,Quad PF,6+ Mos 08/31/2021, 09/11/2022   Influenza,inj,quad, With Preservative 08/28/2016, 08/27/2017   Influenza-Unspecified 08/27/2016   PFIZER(Purple Top)SARS-COV-2 Vaccination 02/20/2020, 03/12/2020   Pneumococcal-Unspecified 11/27/2000   Tdap 03/16/2011    Preventative care: Last colonoscopy:2021 repeat due 2031 Last mammogram: 07/2021 mammogram and U/S with biopsy 08/24/21 revealed scar tissue Last pap smear/pelvic exam: 2017   DEXA: At age age 55years  Prior vaccinations: TD or Tdap: 2012  Influenza: 09/2019, received today Pneumococcal:  2002 Prevnar13: Due at age 67 Shingles/Zostavax: DUE, discussed Shingrix   Names of Other Physician/Practitioners you currently use: 1. [facility] here for primary care 2. Eye Exam Dr Sherryll Burger 2021 3. Dental Exam Dr Jeanice Lim 2022  Patient Care Team: Lucky Cowboy, MD as PCP - General (Internal Medicine) Pricilla Riffle, MD as Consulting Physician (Cardiology) Eldred Manges, MD as Consulting Physician (Orthopedic Surgery) Waynard Reeds, MD as Consulting Physician (Obstetrics and Gynecology) Magrinat, Valentino Hue, MD (Inactive) as Consulting Physician (Oncology) Emelia Loron, MD as Consulting Physician (General Surgery) Lurline Hare, MD as Consulting Physician (Radiation Oncology)  SURGICAL HISTORY She  has a past surgical history that includes Incontinence surgery (2009  &  2005); BLADDER TACK (2003); Vein bypass surgery (AGE 6); transthoracic echocardiogram (06-16-2013); Cardiovascular stress test (07-02-2013  [facility]); CYSTO/  HYDRODISTENTION/  BLADDER BX/   INSTILLATION THERAPY (08-29-2010); Vaginal hysterectomy (2000); Cataract extraction w/ intraocular lens implant (Left, 01/2014); Excision of mesh (N/A, 03/23/2014); Excision vaginal cyst (N/A, 04/06/2014); Radioactive seed guided mastectomy with axillary sentinel lymph node biopsy (Right, 05/18/2016); Breast lumpectomy (Right, 05/18/2016); Breast biopsy (Right, 04/18/2016); Cholecystectomy (N/A, 12/10/2017); Cataract extraction w/ intraocular lens implant (Right, 12/2017); Breast mass removal (Left, 1985); and Anterior cervical decomp/discectomy fusion (N/A, 09/16/2021). FAMILY HISTORY Her family history includes Diabetes in her mother; Diverticulitis in her mother and sister; High blood pressure in her brother; Intestinal polyp in her sister; Lung cancer in her brother. SOCIAL HISTORY She  reports that she quit smoking about 27 years ago. Her smoking use included cigarettes. She started  smoking about 47 years ago. Her smokeless tobacco use includes snuff. She reports that she does not drink alcohol and does not use drugs.   Review of Systems  Constitutional:  Negative for chills and fever.  HENT:  Negative for congestion, hearing loss, sinus pain, sore throat and tinnitus.   Eyes:  Negative for blurred vision and double vision.  Respiratory:  Negative for hemoptysis, sputum production, shortness of breath and wheezing.   Cardiovascular:  Negative for chest pain, palpitations and leg swelling.  Gastrointestinal:  Negative for abdominal pain, constipation, diarrhea (rare), heartburn, nausea and vomiting.  Genitourinary:  Negative for dysuria and urgency.  Musculoskeletal:  Positive for back pain, joint pain (knees), myalgias and neck pain. Negative for falls.  Skin:  Negative for rash.  Neurological:  Positive for tingling (down arms bilaterally). Negative for dizziness, tremors, weakness and headaches.  Endo/Heme/Allergies:  Does not bruise/bleed easily.  Psychiatric/Behavioral:  Negative for depression and suicidal ideas. The patient is nervous/anxious and has insomnia.      Objective:     Today's Vitals   07/06/23 0951  BP: 124/76  Pulse: 89  Resp: 16  Temp: 97.9 F (36.6 C)  SpO2: 99%  Weight: 178 lb (80.7 kg)  Height: 5\' 2"  (1.575 m)     Body mass index is 32.56 kg/m.  General appearance: alert, no distress, WD/WN, female HEENT: normocephalic, sclerae anicteric, TMs without bulging or erythema, Pharynx no erythema, swelling or exudate Oral cavity: MMM, no lesions Sinuses: No maxillary or frontal tenderness Neck: supple, no lymphadenopathy, no thyromegaly, no masses Heart: RRR, normal S1, S2, no murmurs Lungs: CTA bilaterally, no wheezes, rhonchi, or rales Abdomen: +bs, soft, non tender, non distended, no masses, no hepatomegaly, no splenomegaly Musculoskeletal: nontender, no swelling, no obvious deformity Extremities: no edema, no cyanosis, no  clubbing Pulses: 2+ symmetric, upper and lower extremities, normal cap refill Neurological: alert, oriented x 3, CN2-12 intact, strength normal upper extremities and lower extremities, sensation normal throughout, DTRs 2+ throughout, no cerebellar signs, gait normal Psychiatric: normal affect, behavior normal, pleasant   [facility]  07/06/2023

## 2023-07-06 ENCOUNTER — Encounter: Payer: Self-pay | Admitting: Nurse Practitioner

## 2023-07-06 ENCOUNTER — Ambulatory Visit (INDEPENDENT_AMBULATORY_CARE_PROVIDER_SITE_OTHER): Payer: Medicare Other | Admitting: Nurse Practitioner

## 2023-07-06 VITALS — BP 124/76 | HR 89 | Temp 97.9°F | Resp 16 | Ht 62.0 in | Wt 178.0 lb

## 2023-07-06 DIAGNOSIS — E559 Vitamin D deficiency, unspecified: Secondary | ICD-10-CM

## 2023-07-06 DIAGNOSIS — E782 Mixed hyperlipidemia: Secondary | ICD-10-CM | POA: Diagnosis not present

## 2023-07-06 DIAGNOSIS — R7309 Other abnormal glucose: Secondary | ICD-10-CM | POA: Diagnosis not present

## 2023-07-06 DIAGNOSIS — M797 Fibromyalgia: Secondary | ICD-10-CM

## 2023-07-06 DIAGNOSIS — I10 Essential (primary) hypertension: Secondary | ICD-10-CM | POA: Diagnosis not present

## 2023-07-06 DIAGNOSIS — C50411 Malignant neoplasm of upper-outer quadrant of right female breast: Secondary | ICD-10-CM

## 2023-07-06 DIAGNOSIS — M150 Primary generalized (osteo)arthritis: Secondary | ICD-10-CM

## 2023-07-06 DIAGNOSIS — Z170 Estrogen receptor positive status [ER+]: Secondary | ICD-10-CM

## 2023-07-06 DIAGNOSIS — F419 Anxiety disorder, unspecified: Secondary | ICD-10-CM

## 2023-07-06 DIAGNOSIS — K219 Gastro-esophageal reflux disease without esophagitis: Secondary | ICD-10-CM

## 2023-07-06 DIAGNOSIS — Z79899 Other long term (current) drug therapy: Secondary | ICD-10-CM

## 2023-07-06 DIAGNOSIS — G4700 Insomnia, unspecified: Secondary | ICD-10-CM

## 2023-07-06 DIAGNOSIS — E66811 Obesity, class 1: Secondary | ICD-10-CM

## 2023-07-06 DIAGNOSIS — E669 Obesity, unspecified: Secondary | ICD-10-CM

## 2023-07-06 DIAGNOSIS — E039 Hypothyroidism, unspecified: Secondary | ICD-10-CM | POA: Diagnosis not present

## 2023-07-06 DIAGNOSIS — M159 Polyosteoarthritis, unspecified: Secondary | ICD-10-CM

## 2023-07-06 LAB — CBC WITH DIFFERENTIAL/PLATELET
Absolute Monocytes: 494 cells/uL (ref 200–950)
Basophils Absolute: 49 cells/uL (ref 0–200)
Basophils Relative: 0.6 %
Eosinophils Absolute: 97 cells/uL (ref 15–500)
Eosinophils Relative: 1.2 %
HCT: 42.5 % (ref 35.0–45.0)
Hemoglobin: 13.8 g/dL (ref 11.7–15.5)
Lymphs Abs: 4366 cells/uL — ABNORMAL HIGH (ref 850–3900)
MCH: 26.7 pg — ABNORMAL LOW (ref 27.0–33.0)
MCHC: 32.5 g/dL (ref 32.0–36.0)
MCV: 82.4 fL (ref 80.0–100.0)
MPV: 10.3 fL (ref 7.5–12.5)
Monocytes Relative: 6.1 %
Neutro Abs: 3094 cells/uL (ref 1500–7800)
Neutrophils Relative %: 38.2 %
Platelets: 310 10*3/uL (ref 140–400)
RBC: 5.16 10*6/uL — ABNORMAL HIGH (ref 3.80–5.10)
RDW: 13.2 % (ref 11.0–15.0)
Total Lymphocyte: 53.9 %
WBC: 8.1 10*3/uL (ref 3.8–10.8)

## 2023-07-06 NOTE — Patient Instructions (Signed)

## 2023-07-28 ENCOUNTER — Other Ambulatory Visit: Payer: Self-pay | Admitting: Physician Assistant

## 2023-07-28 DIAGNOSIS — R197 Diarrhea, unspecified: Secondary | ICD-10-CM

## 2023-08-12 IMAGING — MG MM BREAST LOCALIZATION CLIP
4 series · 4 of 12 positions shown · non-contrast
Comparison: Previous exam(s).

CLINICAL DATA: Evaluate post biopsy marker clip placement following
stereotactic core needle biopsy of a small right breast mass.

EXAM:
3D DIAGNOSTIC RIGHT MAMMOGRAM POST STEREOTACTIC BIOPSY

[R ML synth-2D]
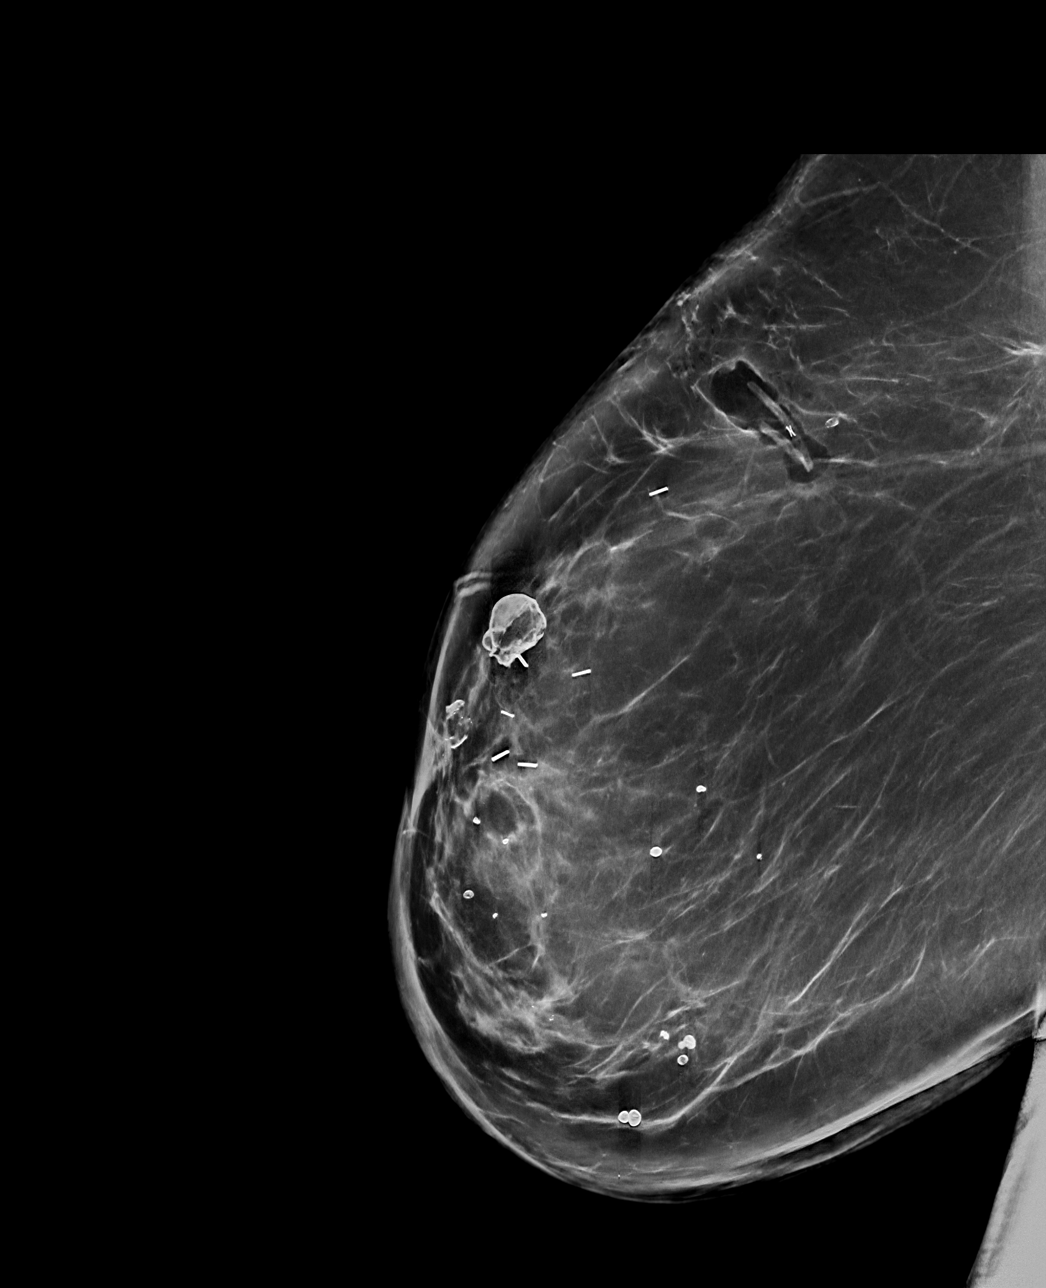

[R CC synth-2D]
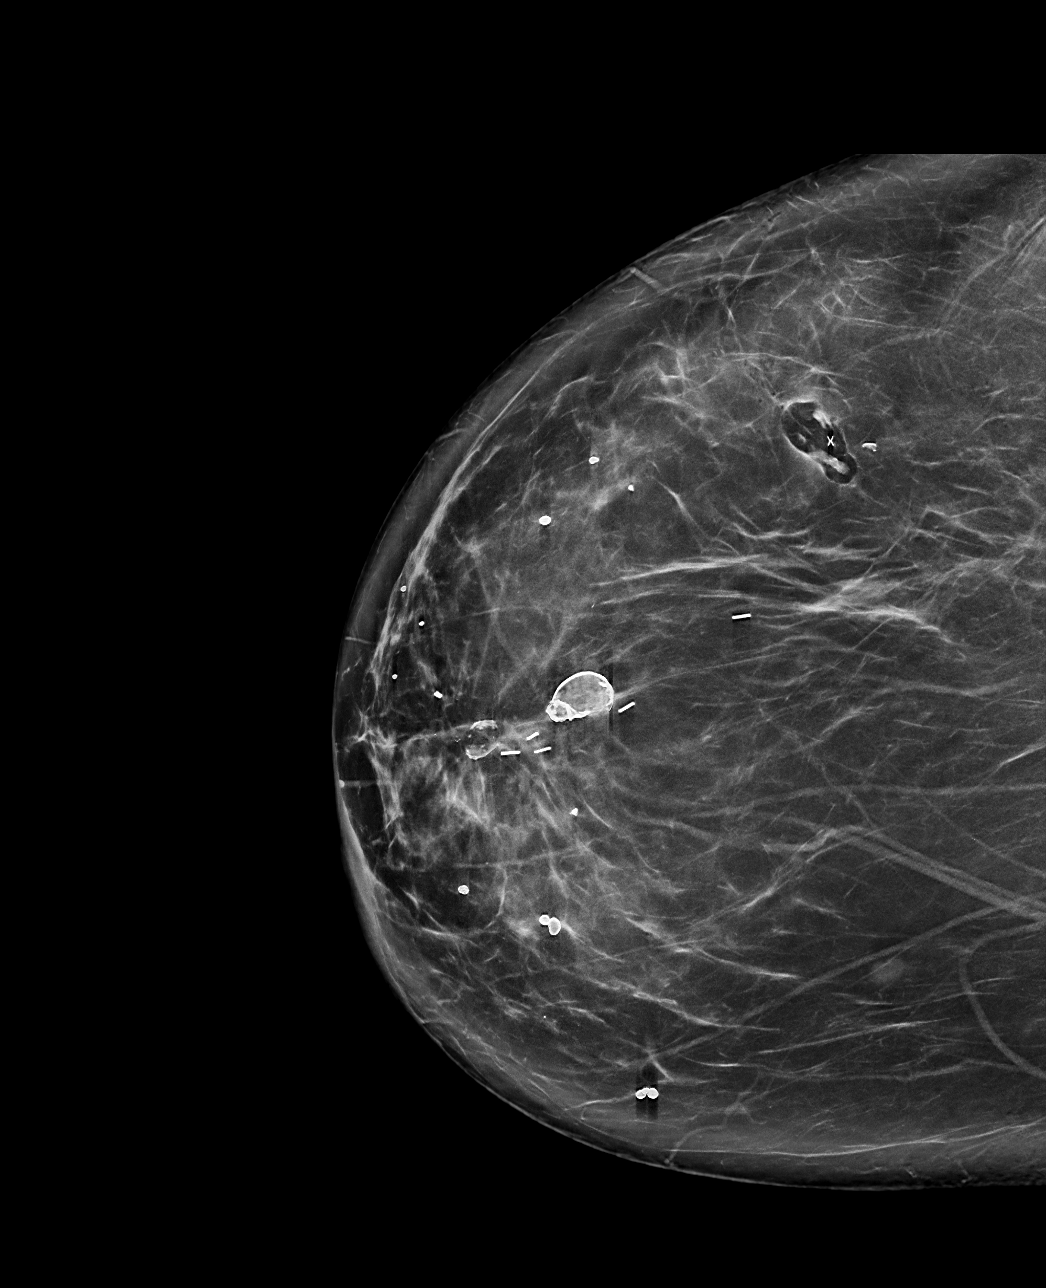

[R ML tomo · tomo slice 51/100.0]
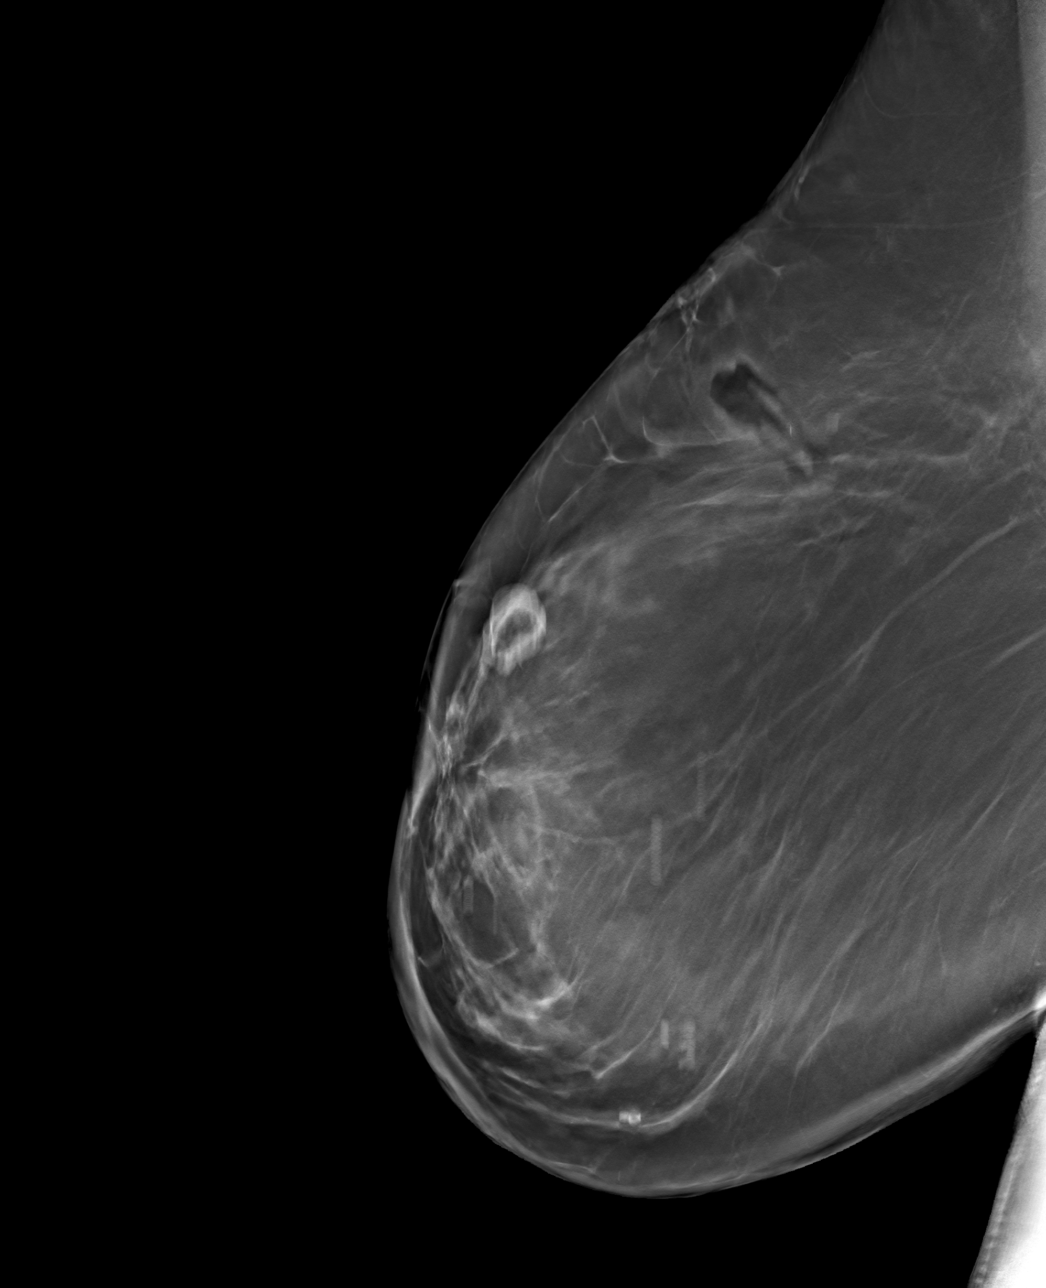

[R CC tomo · tomo slice 43/86.0]
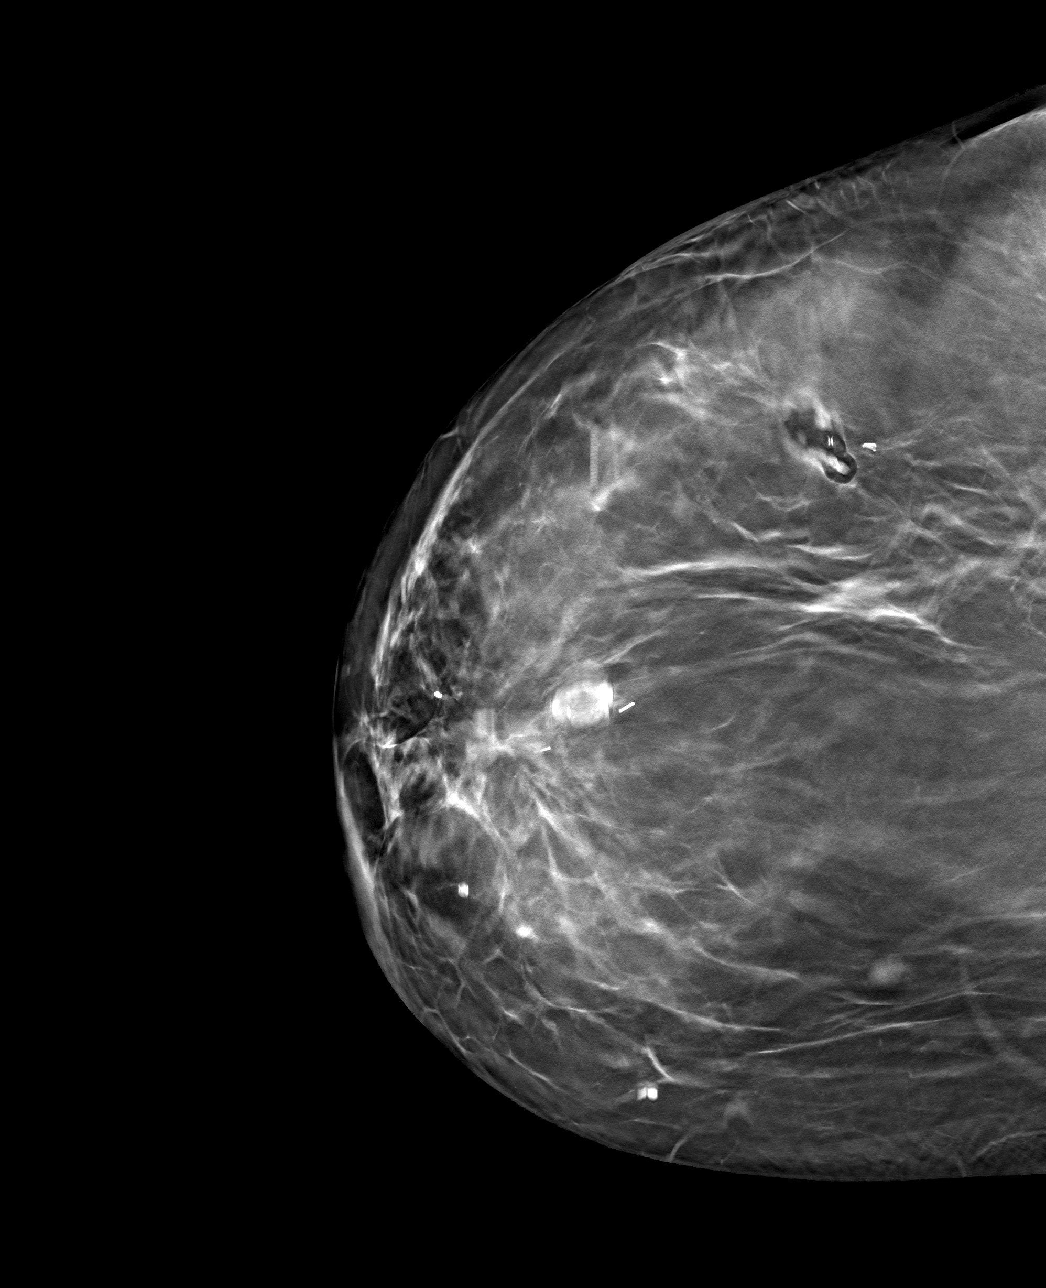

[4 of 12 positions shown; findings below may reference images not displayed]

FINDINGS: 3D Mammographic images were obtained following stereotactic guided
biopsy of a small right breast mass. The biopsy marking clip is in
expected position at the site of biopsy.
IMPRESSION: Appropriate positioning of the X shaped biopsy marking clip at the
site of biopsy in the in the upper outer quadrant of the right
breast in the expected location of the small mass, which is no
longer appreciated.

Final Assessment: Post Procedure Mammograms for Marker Placement

## 2023-08-22 ENCOUNTER — Other Ambulatory Visit: Payer: Self-pay | Admitting: Nurse Practitioner

## 2023-08-22 DIAGNOSIS — E039 Hypothyroidism, unspecified: Secondary | ICD-10-CM

## 2023-08-25 ENCOUNTER — Other Ambulatory Visit: Payer: Self-pay | Admitting: Internal Medicine

## 2023-08-25 DIAGNOSIS — E782 Mixed hyperlipidemia: Secondary | ICD-10-CM

## 2023-08-30 ENCOUNTER — Other Ambulatory Visit: Payer: Self-pay | Admitting: Internal Medicine

## 2023-08-30 DIAGNOSIS — E66811 Obesity, class 1: Secondary | ICD-10-CM

## 2023-09-02 IMAGING — RF DG CERVICAL SPINE 2 OR 3 VIEWS
1 series · 3 of 3 positions shown · non-contrast
Comparison: March 29, 2021.

CLINICAL DATA: C5-6 anterior fusion.

EXAM:
CERVICAL SPINE - 2-3 VIEW; DG C-ARM 1-60 MIN-NO REPORT
Radiation exposure index: 3.64 mGy.

[Series 1: run · 3 of 3 slices shown]
[im 1/3]
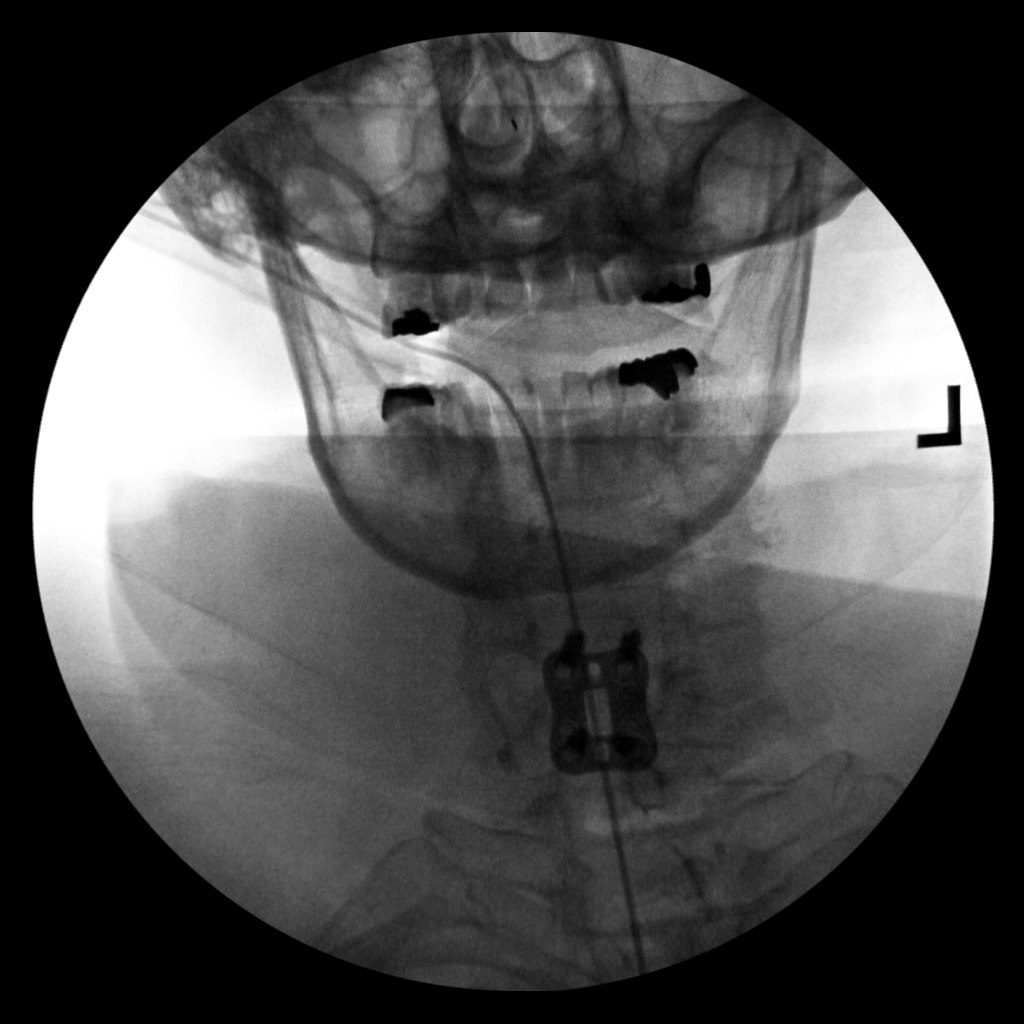
[im 2/3]
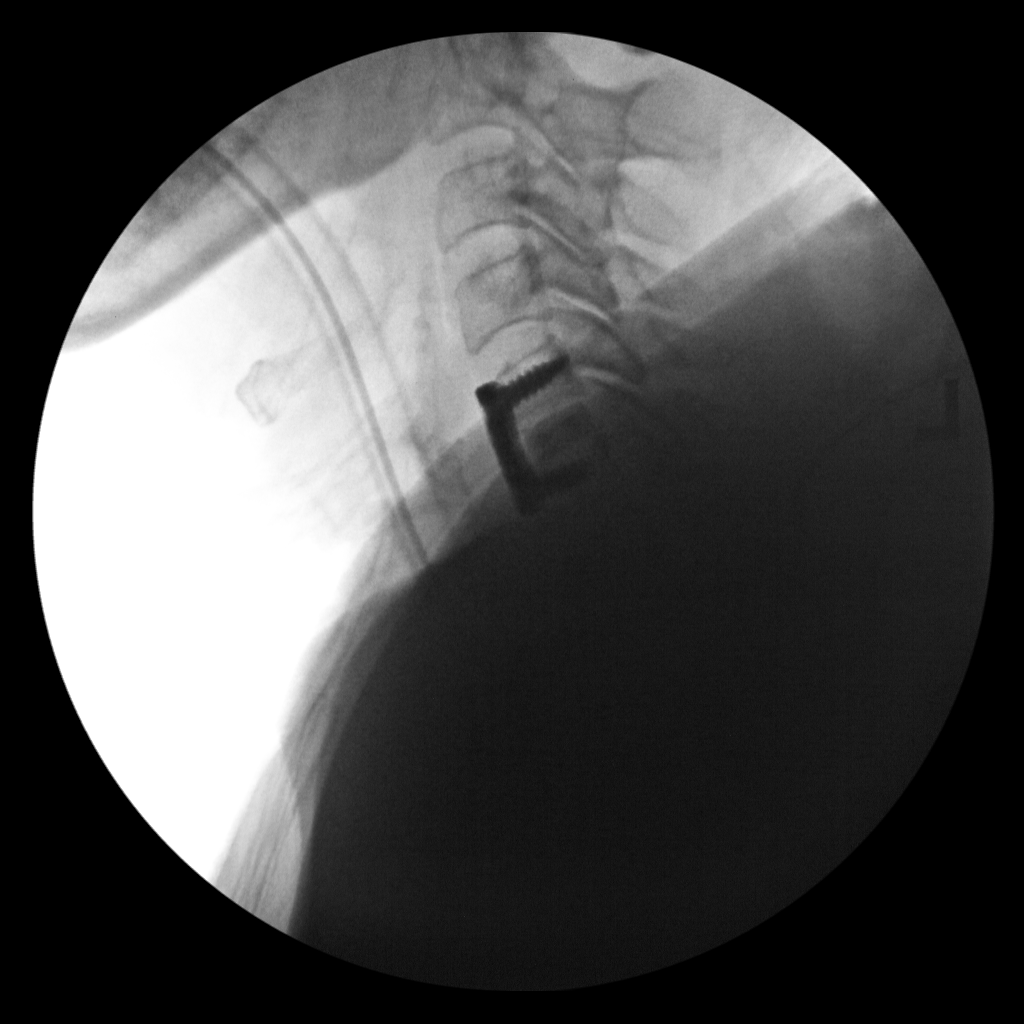
[im 3/3]
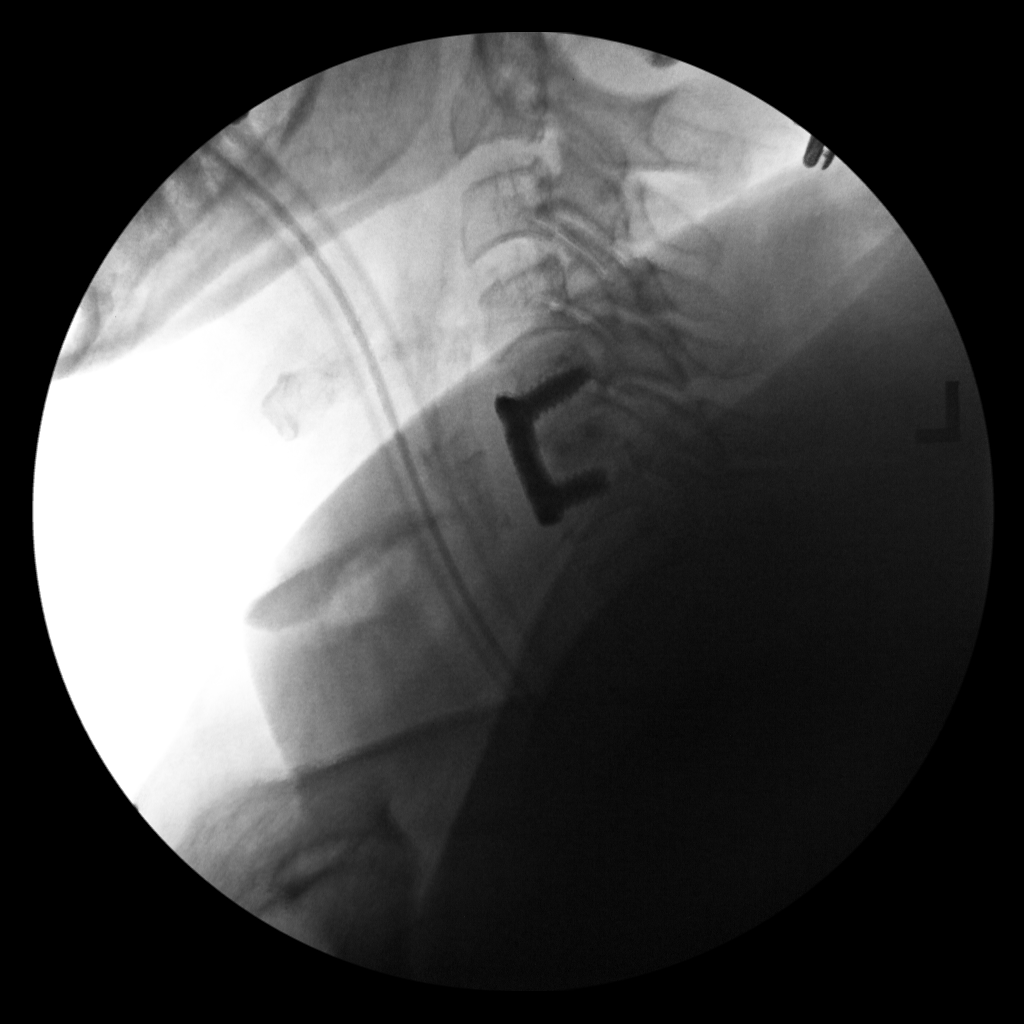

[3 of 3 positions shown; findings below may reference images not displayed]

FINDINGS: Three intraoperative fluoroscopic images were obtained of the
cervical spine. These images demonstrate the patient be status post
surgical anterior fusion of C5-6.
IMPRESSION: Fluoroscopic guidance provided during surgical anterior fusion of
C5-6.

## 2023-09-19 ENCOUNTER — Other Ambulatory Visit: Payer: Self-pay | Admitting: Nurse Practitioner

## 2023-09-19 DIAGNOSIS — G43109 Migraine with aura, not intractable, without status migrainosus: Secondary | ICD-10-CM

## 2023-09-19 DIAGNOSIS — F419 Anxiety disorder, unspecified: Secondary | ICD-10-CM

## 2023-09-19 MED ORDER — ALPRAZOLAM 0.5 MG PO TABS: ORAL_TABLET | ORAL | 0 refills | Status: DC

## 2023-09-19 NOTE — Progress Notes (Signed)
PDMP is reviewed and appropriate  

## 2023-10-01 ENCOUNTER — Ambulatory Visit: Payer: Medicare Other | Admitting: Internal Medicine

## 2023-10-11 ENCOUNTER — Ambulatory Visit (INDEPENDENT_AMBULATORY_CARE_PROVIDER_SITE_OTHER): Payer: Medicare Other | Admitting: Internal Medicine

## 2023-10-11 ENCOUNTER — Encounter: Payer: Self-pay | Admitting: Internal Medicine

## 2023-10-11 VITALS — BP 134/76 | HR 74 | Temp 97.9°F | Resp 16 | Ht 62.0 in | Wt 179.2 lb

## 2023-10-11 DIAGNOSIS — E782 Mixed hyperlipidemia: Secondary | ICD-10-CM

## 2023-10-11 DIAGNOSIS — Z79899 Other long term (current) drug therapy: Secondary | ICD-10-CM

## 2023-10-11 DIAGNOSIS — I10 Essential (primary) hypertension: Secondary | ICD-10-CM | POA: Diagnosis not present

## 2023-10-11 DIAGNOSIS — R7309 Other abnormal glucose: Secondary | ICD-10-CM | POA: Diagnosis not present

## 2023-10-11 DIAGNOSIS — Z23 Encounter for immunization: Secondary | ICD-10-CM | POA: Diagnosis not present

## 2023-10-11 DIAGNOSIS — E559 Vitamin D deficiency, unspecified: Secondary | ICD-10-CM

## 2023-10-11 DIAGNOSIS — E039 Hypothyroidism, unspecified: Secondary | ICD-10-CM

## 2023-10-11 DIAGNOSIS — K219 Gastro-esophageal reflux disease without esophagitis: Secondary | ICD-10-CM

## 2023-10-11 NOTE — Patient Instructions (Addendum)
Due to recent changes in healthcare laws, you may see the results of your imaging and laboratory studies on MyChart before your provider has had a chance to review them.  We understand that in some cases there may be results that are confusing or concerning to you. Not all laboratory results come back in the same time frame and the provider may be waiting for multiple results in order to interpret others.  Please give [place] 48 hours in order for your provider to thoroughly review all the results before contacting the office for clarification of your results.  ++++++++++++++++++++++++++  Vit D  & Vit C 1,000 mg   are recommended to help protect  against the Covid-19 and other Corona viruses.    Also it's recommended  to take  Zinc 50 mg x 1/2 tab = 25 mg                     to help  protect against the Covid-19   and best place to get  is also on [employer].com  and don't pay more than 6-8 cents /pill !   +++++++++++++++++++++++++++++++++++++++ Recommend Adult Low Dose Aspirin or  coated  Aspirin 81 mg daily  To reduce risk of Colon Cancer 40 %,  Skin Cancer 26 % ,  Melanoma 46%  and  Pancreatic cancer 60% +++++++++++++++++++++++++++++++++++++++++ Vitamin D goal  is between 70-100.  Please make sure that you are taking your Vitamin D as directed.  It is very important as a natural anti-inflammatory  helping hair, skin, and nails, as well as reducing stroke and heart attack risk.  It helps your bones and helps with mood. It also decreases numerous cancer risks so please take it as directed.  Low Vit D is associated with a 200-300% higher risk for CANCER  and 200-300% higher risk for HEART   ATTACK  &  STROKE.   .....................................Marland Kitchen It is also associated with higher death rate at younger ages,  autoimmune diseases like Rheumatoid arthritis, Lupus, Multiple Sclerosis.    Also many other serious conditions, like depression, Alzheimer's Dementia, infertility, muscle aches,  fatigue, fibromyalgia - just to name a few. +++++++++++++++++++++++++++++++++++++++++ Recommend the book "The END of DIETING" by Dr Monico Hoar  & the book "The END of DIABETES " by Dr Monico Hoar At [facility]com - get book & Audio CD's    Being diabetic has a  300% increased risk for heart attack, stroke, cancer, and alzheimer- type vascular dementia. It is very important that you work harder with diet by avoiding all foods that are white. Avoid white rice (brown & wild rice is OK), white potatoes (sweetpotatoes in moderation is OK), White bread or wheat bread or anything made out of white flour like bagels, donuts, rolls, buns, biscuits, cakes, pastries, cookies, pizza crust, and pasta (made from white flour & egg whites) - vegetarian pasta or spinach or wheat pasta is OK. Multigrain breads like Arnold's or [employer], or multigrain sandwich thins or flatbreads.  Diet, exercise and weight loss can reverse and cure diabetes in the early stages.  Diet, exercise and weight loss is very important in the control and prevention of complications of diabetes which affects every system in your body, ie. Brain - dementia/stroke, eyes - glaucoma/blindness, heart - heart attack/heart failure, kidneys - dialysis, stomach - gastric paralysis, intestines - malabsorption, nerves - severe painful neuritis, circulation - gangrene & loss of a leg(s), and finally cancer and Alzheimers.    I recommend  avoid fried & greasy foods,  sweets/candy, white rice (brown or wild rice or Quinoa is OK), white potatoes (sweet potatoes are OK) - anything made from white flour - bagels, doughnuts, rolls, buns, biscuits,white and wheat breads, pizza crust and traditional pasta made of white flour & egg white(vegetarian pasta or spinach or wheat pasta is OK).  Multi-grain bread is OK - like multi-grain flat bread or sandwich thins. Avoid alcohol in excess. Exercise is also important.    Eat all the vegetables you want - avoid meat,  especially red meat and dairy - especially cheese.  Cheese is the most concentrated form of trans-fats which is the worst thing to clog up our arteries. Veggie cheese is OK which can be found in the fresh produce section at [employer] or [employer] or [employer]  +++++++++++++++++++++++++++++++++++++++ DASH Eating Plan  DASH stands for "Dietary Approaches to Stop Hypertension."   The DASH eating plan is a healthy eating plan that has been shown to reduce high blood pressure (hypertension). Additional health benefits may include reducing the risk of type 2 diabetes mellitus, heart disease, and stroke. The DASH eating plan may also help with weight loss. WHAT DO I NEED TO KNOW ABOUT THE DASH EATING PLAN? For the DASH eating plan, you will follow these general guidelines: Choose foods with a percent daily value for sodium of less than 5% (as listed on the food label). Use salt-free seasonings or herbs instead of table salt or sea salt. Check with your health care provider or pharmacist before using salt substitutes. Eat lower-sodium products, often labeled as "lower sodium" or "no salt added." Eat fresh foods. Eat more vegetables, fruits, and low-fat dairy products. Choose whole grains. Look for the word "whole" as the first word in the ingredient list. Choose fish  Limit sweets, desserts, sugars, and sugary drinks. Choose heart-healthy fats. Eat veggie cheese  Eat more home-cooked food and less restaurant, buffet, and fast food. Limit fried foods. Cook foods using methods other than frying. Limit canned vegetables. If you do use them, rinse them well to decrease the sodium. When eating at a restaurant, ask that your food be prepared with less salt, or no salt if possible.                      WHAT FOODS CAN I EAT? Read Dr Francis Dowse Fuhrman's books on The End of Dieting & The End of Diabetes  Grains Whole grain or whole wheat bread. Brown rice. Whole grain or whole wheat pasta. Quinoa,  bulgur, and whole grain cereals. Low-sodium cereals. Corn or whole wheat flour tortillas. Whole grain cornbread. Whole grain crackers. Low-sodium crackers.  Vegetables Fresh or frozen vegetables (raw, steamed, roasted, or grilled). Low-sodium or reduced-sodium tomato and vegetable juices. Low-sodium or reduced-sodium tomato sauce and paste. Low-sodium or reduced-sodium canned vegetables.   Fruits All fresh, canned (in natural juice), or frozen fruits.  Protein Products  All fish and seafood.  Dried beans, peas, or lentils. Unsalted nuts and seeds. Unsalted canned beans.  Dairy Low-fat dairy products, such as skim or 1% milk, 2% or reduced-fat cheeses, low-fat ricotta or cottage cheese, or plain low-fat yogurt. Low-sodium or reduced-sodium cheeses.  Fats and Oils Tub margarines without trans fats. Light or reduced-fat mayonnaise and salad dressings (reduced sodium). Avocado. Safflower, olive, or canola oils. Natural peanut or almond butter.  Other Unsalted popcorn and pretzels. The items listed above may not be a complete list of recommended foods or beverages. Contact your  dietitian for more options.  +++++++++++++++  WHAT FOODS ARE NOT RECOMMENDED? Grains/ White flour or wheat flour White bread. White pasta. White rice. Refined cornbread. Bagels and croissants. Crackers that contain trans fat.  Vegetables  Creamed or fried vegetables. Vegetables in a . Regular canned vegetables. Regular canned tomato sauce and paste. Regular tomato and vegetable juices.  Fruits Dried fruits. Canned fruit in light or heavy syrup. Fruit juice.  Meat and Other Protein Products Meat in general - RED meat & White meat.  Fatty cuts of meat. Ribs, chicken wings, all processed meats as bacon, sausage, bologna, salami, fatback, hot dogs, bratwurst and packaged luncheon meats.  Dairy Whole or 2% milk, cream, half-and-half, and cream cheese. Whole-fat or sweetened yogurt. Full-fat cheeses or blue cheese.  Non-dairy creamers and whipped toppings. Processed cheese, cheese spreads, or cheese curds.  Condiments Onion and garlic salt, seasoned salt, table salt, and sea salt. Canned and packaged gravies. Worcestershire sauce. Tartar sauce. Barbecue sauce. Teriyaki sauce. Soy sauce, including reduced sodium. Steak sauce. Fish sauce. Oyster sauce. Cocktail sauce. Horseradish. Ketchup and mustard. Meat flavorings and tenderizers. Bouillon cubes. Hot sauce. Tabasco sauce. Marinades. Taco seasonings. Relishes.  Fats and Oils Butter, stick margarine, lard, shortening and bacon fat. Coconut, palm kernel, or palm oils. Regular salad dressings.  Pickles and olives. Salted popcorn and pretzels.  The items listed above may not be a complete list of foods and beverages to avoid.

## 2023-10-11 NOTE — Progress Notes (Signed)
Future Appointments  Date Time Provider Department  10/11/2023                   6 mo ov 11:30 AM Lucky Cowboy, MD [facility]  01/02/2024                   wellness 10:30 AM Raynelle Dick, NP [facility]  04/03/2024                   cpe 10:00 AM Lucky Cowboy, MD [facility]    History of Present Illness:       This very nice 67 y.o.  MWF with HTN, HLD, Pre-Diabetes, Hypothyroidism and Vitamin D Deficiency  presents for 6 month follow up .  Patient's GERD is controlled on her Meds. Patient has hx/o ER (+) invasive Rt Breast Ca (2017) & was followed by Dr Darnelle Catalan on Tamoxifen for 5 yrs thru 2022.         Patient has been on SS Disability since 2005 for Fibromyalgia & Chronic Pain Syndrome from DDD/DJD and Interstitial Cystitis .         Patient is treated for HTN (2004) & BP has been controlled at home. Today's BP is at goal -  134/76 . Patient has had no complaints of any cardiac type chest pain, palpitations, dyspnea Pollyann Kennedy /PND, dizziness, claudication or dependent edema.        Hyperlipidemia is not controlled with diet & Rosuvastatin. Patient denies myalgias or other med SE's. Last Lipids were not  at goal:  Lab Results  Component Value Date   CHOL 236 (H) 07/06/2023   HDL 56 07/06/2023   LDLCALC 151 (H) 07/06/2023   TRIG 159 (H) 07/06/2023   CHOLHDL 4.2 07/06/2023      Also, the patient has moderate obesity (BMI 31+) and history of  PreDiabetes (A1c 6.1% / 2011) and has had no symptoms of reactive hypoglycemia, diabetic polys, paresthesias or visual blurring.  Last A1c was not at goal:  Lab Results  Component Value Date   HGBA1C 6.0 (H) 02/09/2022           Patient was dx'd Hypothyroid in 2002  & has been on Thyroid Replacement since  then.         Further, the patient also has history of Vitamin D Deficiency ("11" / 2008)  and supplements vitamin D without any suspected side-effects. Last vitamin D was at goal:  Lab Results  Component Value  Date   VD25OH 102 (H) 02/09/2022    Current Outpatient Medications  Medication Instructions   ALPRAZolam (XANAX) 0.5 MG tablet TAKE 1/2 TO 1 DAILY FOR ANXIETY LIMIT TO 5 DAYS A WEEK   cetirizine  10 mg, Oral, Daily PRN   colestipol (COLESTID 2g     .                                                                                                                                                                                                                          ,  2 times daily   dicyclomine (BENTYL)20 mg 3 times daily PRN   ezetimibe  10 MG tablet Take  1 tablet Daily    famotidine  40 mg Daily PRN   FLONASE  nasal spray USE TWO SPRAY(S) IN EACH NOSTRIL ONCE DAILY AS NEEDED   gabapentin ( 600 MG tablet Take  1 tablet   3 x /day  for Chronic Pain     levothyroxine (100 MCG tablet Take  1 tablet  Daily     loperamide ( 2 MG capsule As needed   meclizine  25 MG tablet TAKE ONE-HALF TO ONE TABLET BY MOUTH UP TO THREE TIMES DAILY    metFORMIN-XR 500 MG  Take 2 tablets 2 x/ day with meals  for Diabetes   methocarbamol 500 mg, Oral, Every 6 hours PRN   ondansetron (ZOFRAN) 4 mg, Oral, Daily PRN   potassium chloride 20 MEQ tablet Take  1 tablet  2 x /day     rizatriptan (MAXALT) 10 mg, Oral, As needed, May repeat in 2 hours if needed   rosuvastatin (CRESTOR) 10 MG tablet TAKE  1 TABLET   DAILY    topiramate (TOPAMAX) 50 MG tablet Take 1/2 to 1 tablet 2 x / day - Suppertime & Bedtime   venlafaxine XR (EFFEXOR-XR) 37.5 mgDaily with breakfast   vitamin C 1,000 mg, Oral, Daily   Vitamin D-3 5,000 Units, Oral, 2 times daily   zinc gluconate 50 mg, Oral, Daily     Allergies  Allergen Reactions   Lipitor [Atorvastatin] Muscle and legs aches       Shellfish Allergy Diarrhea and Nausea And Vomiting   Betadine [Povidone Iodine] Rash   Iodine     Pt unsure of reaction     PMHx:   Past Medical History:  Diagnosis Date   Anxiety    Breast cancer (HCC)    Complication of anesthesia     hypotension   DDD (degenerative disc disease), lumbar    Diabetes mellitus without complication (HCC)    Erosion of vaginal mesh (HCC)    Fibromyalgia    GERD (gastroesophageal reflux disease)    History of gastric ulcer    History of radiation therapy 07/06/16- 08/22/16   Right Breast   Hypertension    Hypothyroidism    Personal history of radiation therapy    Rectocele     Immunization History  Administered Date(s) Administered   Influenza Inj Mdck Quad With Preservative 09/04/2017, 09/30/2018, 09/15/2020   Influenza Split 09/18/2013, 09/25/2014, 10/21/2019   Influenza, Seasonal, Injecte, Preservative Fre 10/11/2015   Influenza,inj,Quad PF,6+ Mos 08/31/2021   Influenza,inj,quad, With Preservative 08/28/2016, 08/27/2017   Influenza-Unspecified 08/27/2016   PFIZER(Purple Top)SARS-COV-2 Vaccination 02/20/2020, 03/12/2020   Pneumococcal-Unspecified 11/27/2000   Tdap 03/16/2011    Past Surgical History:  Procedure Laterality Date   ANTERIOR CERVICAL DECOMP/DISCECTOMY FUSION N/A 09/16/2021   Procedure: C5-6 ANTERIOR CERVICAL DISCECTOMY FUSION, ALLOGRAFT, PLATE;  Surgeon: Eldred Manges, MD;  Location: [facility] OR;  Service: Orthopedics;  Laterality: N/A;   BLADDER TACK  2003   BREAST BIOPSY Right 04/18/2016   BREAST LUMPECTOMY Right 05/18/2016   Breast mass removal Left 1985   Benign   CARDIOVASCULAR STRESS TEST  07-02-2013  DR DALTON MCLEAN   LOW RISK NUCLEAR STUDY WITH A SMALL, MILD APICAL SEPTAL FIXED PERFUSION DEFECT .  GIVEN NORMAL WALL FUNCTION , SUSPECT REPRESENTS ATTENUATION/  EF 74%/  NO ISCHEMIA   CATARACT EXTRACTION W/ INTRAOCULAR LENS IMPLANT Left 01/2014   CATARACT  EXTRACTION W/ INTRAOCULAR LENS IMPLANT Right 12/2017   Dr. Sherryll Burger   CHOLECYSTECTOMY N/A 12/10/2017   Procedure: LAPAROSCOPIC CHOLECYSTECTOMY;  Surgeon: Emelia Loron, MD;  Location: [facility] OR;  Service: General;  Laterality: N/A;   CYSTO/  HYDRODISTENTION/  BLADDER BX/   INSTILLATION THERAPY  08-29-2010    EXCISION OF MESH N/A 03/23/2014   Procedure: EXCISION OF VAGINAL AND PERI-URETHRAL MESH, EXTRACTION FROM URETHRA TO APEX, insertion of XENFORM in the vaginal vault;  Surgeon: Kathi Ludwig, MD;  Location: [facility];  Service: Urology;  Laterality: N/A;   EXCISION VAGINAL CYST N/A 04/06/2014   Procedure: VAGINAL SPECULUM EXAMINATION/POSSIBLE DRAINAGE OF HEMATOMA;  Surgeon: Kathi Ludwig, MD;  Location: [facility];  Service: Urology;  Laterality: N/A;   INCONTINENCE SURGERY  2009  &  2005   RADIOACTIVE SEED GUIDED PARTIAL MASTECTOMY WITH AXILLARY SENTINEL LYMPH NODE BIOPSY Right 05/18/2016   Procedure: RADIOACTIVE SEED GUIDED RIGHT BREAST LUMPECTOMY WITH AXILLARY SENTINEL LYMPH NODE BIOPSY;  Surgeon: Emelia Loron, MD;  Location: [facility];  Service: General;  Laterality: Right;  RADIOACTIVE SEED GUIDED RIGHT BREAST LUMPECTOMY WITH AXILLARY SENTINEL LYMPH NODE BIOPSY   TRANSTHORACIC ECHOCARDIOGRAM  06-16-2013   GRADE I DIASTOLIC DYSFUNCTION/  EF 55-60%/  MILD AR/  TRIVIAL MR  &  TR   VAGINAL HYSTERECTOMY  2000   w/  unilateral salpingoophorectomy   VEIN BYPASS SURGERY  AGE 28   RIGHT LEG    FHx:    Reviewed / unchanged  SHx:    Reviewed / unchanged   Systems Review:  Constitutional: Denies fever, chills, wt changes, headaches, insomnia, fatigue, night sweats, change in appetite. Eyes: Denies redness, blurred vision, diplopia, discharge, itchy, watery eyes.  ENT: Denies discharge, congestion, post nasal drip, epistaxis, sore throat, earache, hearing loss, dental pain, tinnitus, vertigo, sinus pain, snoring.  CV: Denies chest pain, palpitations, irregular heartbeat, syncope, dyspnea, diaphoresis, orthopnea, PND, claudication or edema. Respiratory: denies cough, dyspnea, DOE, pleurisy, hoarseness, laryngitis, wheezing.  Gastrointestinal: Denies dysphagia, odynophagia, heartburn, reflux, water brash, abdominal pain or cramps, nausea,  vomiting, bloating, diarrhea, constipation, hematemesis, melena, hematochezia  or hemorrhoids. Genitourinary: Denies dysuria, frequency, urgency, nocturia, hesitancy, discharge, hematuria or flank pain. Musculoskeletal: Denies arthralgias, myalgias, stiffness, jt. swelling, pain, limping or strain/sprain.  Skin: Denies pruritus, rash, hives, warts, acne, eczema or change in skin lesion(s). Neuro: No weakness, tremor, incoordination, spasms, paresthesia or pain. Psychiatric: Denies confusion, memory loss or sensory loss. Endo: Denies change in weight, skin or hair change.  Heme/Lymph: No excessive bleeding, bruising or enlarged lymph nodes.  Physical Exam  BP 134/76   Pulse 74   Temp 97.9 F (36.6 C)   Resp 16   Ht 5\' 2"  (1.575 m)   Wt 179 lb 3.2 oz (81.3 kg)   SpO2 99%   BMI 32.78 kg/m   Appears  well nourished, well groomed  and in no distress.  Eyes: PERRLA, EOMs, conjunctiva no swelling or erythema. Sinuses: No frontal/maxillary tenderness ENT/Mouth: EAC's clear, TM's nl w/o erythema, bulging. Nares clear w/o erythema, swelling, exudates. Oropharynx clear without erythema or exudates. Oral hygiene is good. Tongue normal, non obstructing. Hearing intact.  Neck: Supple. Thyroid not palpable. Car 2+/2+ without bruits, nodes or JVD. Chest: Respirations nl with BS clear & equal w/o rales, rhonchi, wheezing or stridor.  Cor: Heart sounds normal w/ regular rate and rhythm without sig. murmurs, gallops, clicks or rubs. Peripheral pulses normal and equal  without edema.  Abdomen: Soft & bowel  sounds normal. Non-tender w/o guarding, rebound, hernias, masses or organomegaly.  Lymphatics: Unremarkable.  Musculoskeletal: Full ROM all peripheral extremities, joint stability, 5/5 strength and normal gait.  (+) exquisite tender point tenderness of superior anterior joint line of Lt shoulder.  Skin: Warm, dry without exposed rashes, lesions or ecchymosis apparent.  Neuro: Cranial nerves intact,  reflexes equal bilaterally. Sensory-motor testing grossly intact. Tendon reflexes grossly intact.  Pysch: Alert & oriented x 3.  Insight and judgement nl & appropriate. No ideations.  Assessment and Plan:  1. Essential hypertension  - CBC with Differential/Platelet - COMPLETE METABOLIC PANEL WITH GFR - Magnesium - TSH  2. Hyperlipidemia, mixed  - Lipid panel - TSH  3. Abnormal glucose  - Hemoglobin A1c - Insulin, random  4. Vitamin D deficiency  - VITAMIN D 25 Hydroxy   5. Hypothyroidism  - Hemoglobin A1c   6. Gastroesophageal reflux disease  - CBC with Differential/Platelet   7. Medication management  - CBC with Differential/Platelet - COMPLETE METABOLIC PANEL WITH GFR - Magnesium - Lipid panel - TSH - Hemoglobin A1c - Insulin, random - VITAMIN D 25 Hydroxy           Discussed  regular exercise, BP monitoring, weight control to achieve/maintain BMI less than 25 and discussed med and SE's. Recommended labs to assess and monitor clinical status with further disposition pending results of labs.  I discussed the assessment and treatment plan with the patient. The patient was provided an opportunity to ask questions and all were answered. The patient agreed with the plan and demonstrated an understanding of the instructions.  I provided over 30 minutes of exam, counseling, chart review and  complex critical decision making.         The patient was advised to call back or seek an in-person evaluation if the symptoms worsen or if the condition fails to improve as anticipated.   Marinus Maw, MD

## 2023-10-12 LAB — COMPLETE METABOLIC PANEL WITH GFR
AG Ratio: 1.8 (calc) (ref 1.0–2.5)
ALT: 23 U/L (ref 6–29)
AST: 23 U/L (ref 10–35)
Albumin: 4 g/dL (ref 3.6–5.1)
Alkaline phosphatase (APISO): 95 U/L (ref 37–153)
BUN: 10 mg/dL (ref 7–25)
CO2: 27 mmol/L (ref 20–32)
Calcium: 9.2 mg/dL (ref 8.6–10.4)
Chloride: 105 mmol/L (ref 98–110)
Creat: 0.78 mg/dL (ref 0.50–1.05)
Globulin: 2.2 g/dL (ref 1.9–3.7)
Glucose, Bld: 87 mg/dL (ref 65–99)
Potassium: 4.3 mmol/L (ref 3.5–5.3)
Sodium: 140 mmol/L (ref 135–146)
Total Bilirubin: 0.4 mg/dL (ref 0.2–1.2)
Total Protein: 6.2 g/dL (ref 6.1–8.1)
eGFR: 84 mL/min/{1.73_m2} (ref >=60)

## 2023-10-12 LAB — CBC WITH DIFFERENTIAL/PLATELET
Absolute Lymphocytes: 4608 {cells}/uL — ABNORMAL HIGH (ref 850–3900)
Absolute Monocytes: 756 {cells}/uL (ref 200–950)
Basophils Absolute: 54 {cells}/uL (ref 0–200)
Basophils Relative: 0.6 %
Eosinophils Absolute: 72 {cells}/uL (ref 15–500)
Eosinophils Relative: 0.8 %
HCT: 42 % (ref 35.0–45.0)
Hemoglobin: 13.6 g/dL (ref 11.7–15.5)
MCH: 27.3 pg (ref 27.0–33.0)
MCHC: 32.4 g/dL (ref 32.0–36.0)
MCV: 84.3 fL (ref 80.0–100.0)
MPV: 10.5 fL (ref 7.5–12.5)
Monocytes Relative: 8.4 %
Neutro Abs: 3510 {cells}/uL (ref 1500–7800)
Neutrophils Relative %: 39 %
Platelets: 263 10*3/uL (ref 140–400)
RBC: 4.98 10*6/uL (ref 3.80–5.10)
RDW: 13.8 % (ref 11.0–15.0)
Total Lymphocyte: 51.2 %
WBC: 9 10*3/uL (ref 3.8–10.8)

## 2023-10-12 LAB — LIPID PANEL
Cholesterol: 120 mg/dL (ref <200)
HDL: 54 mg/dL (ref >=50)
LDL Cholesterol (Calc): 48 mg/dL
Non-HDL Cholesterol (Calc): 66 mg/dL (ref <130)
Total CHOL/HDL Ratio: 2.2 (calc) (ref <5.0)
Triglycerides: 93 mg/dL (ref <150)

## 2023-10-12 LAB — MAGNESIUM: Magnesium: 1.9 mg/dL (ref 1.5–2.5)

## 2023-10-12 LAB — HEMOGLOBIN A1C
Hgb A1c MFr Bld: 6.2 %{Hb} — ABNORMAL HIGH (ref <5.7)
Mean Plasma Glucose: 131 mg/dL
eAG (mmol/L): 7.3 mmol/L

## 2023-10-12 LAB — VITAMIN D 25 HYDROXY (VIT D DEFICIENCY, FRACTURES): Vit D, 25-Hydroxy: 41 ng/mL (ref 30–100)

## 2023-10-12 LAB — INSULIN, RANDOM: Insulin: 24.6 u[IU]/mL — ABNORMAL HIGH

## 2023-10-12 LAB — TSH: TSH: 1.33 m[IU]/L (ref 0.40–4.50)

## 2023-10-17 ENCOUNTER — Other Ambulatory Visit: Payer: Self-pay | Admitting: Internal Medicine

## 2023-10-17 ENCOUNTER — Telehealth: Payer: Self-pay | Admitting: Nurse Practitioner

## 2023-10-17 ENCOUNTER — Other Ambulatory Visit: Payer: Self-pay | Admitting: Nurse Practitioner

## 2023-10-17 DIAGNOSIS — R059 Cough, unspecified: Secondary | ICD-10-CM

## 2023-10-17 MED ORDER — PHENTERMINE HCL 37.5 MG PO TABS: ORAL_TABLET | ORAL | 1 refills | Status: AC

## 2023-10-17 MED ORDER — BENZONATATE 100 MG PO CAPS: 100.0000 mg | ORAL_CAPSULE | Freq: Four times a day (QID) | ORAL | 1 refills | Status: DC | PRN

## 2023-10-17 NOTE — Telephone Encounter (Signed)
Patient asked if you could refill Benzonatate for her cough. Due to the steel plate in her neck, coughing is painful. Pharm is [id number] [place] ([place]), [place] - [address]

## 2023-10-17 NOTE — Telephone Encounter (Signed)
I sent in the refill of Desiree Anderson for her

## 2023-12-18 ENCOUNTER — Ambulatory Visit: Payer: Medicare Other | Admitting: Nurse Practitioner

## 2024-01-02 ENCOUNTER — Ambulatory Visit: Payer: Medicare Other | Admitting: Nurse Practitioner

## 2024-01-08 ENCOUNTER — Encounter: Payer: Self-pay | Admitting: *Deleted

## 2024-01-09 ENCOUNTER — Other Ambulatory Visit: Payer: Self-pay | Admitting: Family

## 2024-01-10 ENCOUNTER — Other Ambulatory Visit: Payer: Self-pay

## 2024-01-10 MED ORDER — FLUTICASONE PROPIONATE 50 MCG/ACT NA SUSP: NASAL | 0 refills | Status: AC

## 2024-01-15 ENCOUNTER — Ambulatory Visit: Payer: Medicare Other | Admitting: Nurse Practitioner

## 2024-01-16 ENCOUNTER — Other Ambulatory Visit: Payer: Self-pay

## 2024-01-22 ENCOUNTER — Other Ambulatory Visit: Payer: Self-pay

## 2024-01-22 DIAGNOSIS — F419 Anxiety disorder, unspecified: Secondary | ICD-10-CM

## 2024-01-22 MED ORDER — ALPRAZOLAM 0.5 MG PO TABS: ORAL_TABLET | ORAL | 0 refills | Status: DC

## 2024-01-22 MED ORDER — HYOSCYAMINE SULFATE 0.125 MG SL SUBL: 0.1250 mg | SUBLINGUAL_TABLET | Freq: Four times a day (QID) | SUBLINGUAL | 0 refills | Status: AC | PRN

## 2024-01-28 ENCOUNTER — Other Ambulatory Visit: Payer: Self-pay

## 2024-01-28 MED ORDER — POTASSIUM CHLORIDE CRYS ER 20 MEQ PO TBCR: EXTENDED_RELEASE_TABLET | ORAL | 0 refills | Status: DC

## 2024-02-21 ENCOUNTER — Ambulatory Visit (INDEPENDENT_AMBULATORY_CARE_PROVIDER_SITE_OTHER): Payer: Medicare Other | Admitting: Family Medicine

## 2024-02-21 ENCOUNTER — Encounter: Payer: Self-pay | Admitting: Family Medicine

## 2024-02-21 VITALS — BP 131/84 | HR 72 | Temp 97.9°F | Resp 16 | Ht 63.0 in | Wt 172.2 lb

## 2024-02-21 DIAGNOSIS — R7303 Prediabetes: Secondary | ICD-10-CM | POA: Diagnosis not present

## 2024-02-21 DIAGNOSIS — E782 Mixed hyperlipidemia: Secondary | ICD-10-CM | POA: Diagnosis not present

## 2024-02-21 DIAGNOSIS — I10 Essential (primary) hypertension: Secondary | ICD-10-CM | POA: Diagnosis not present

## 2024-02-21 DIAGNOSIS — E66811 Obesity, class 1: Secondary | ICD-10-CM

## 2024-02-21 DIAGNOSIS — Z7689 Persons encountering health services in other specified circumstances: Secondary | ICD-10-CM

## 2024-02-21 DIAGNOSIS — E039 Hypothyroidism, unspecified: Secondary | ICD-10-CM

## 2024-02-21 MED ORDER — TOPIRAMATE 50 MG PO TABS: ORAL_TABLET | ORAL | 1 refills | Status: DC

## 2024-02-25 ENCOUNTER — Encounter: Payer: Self-pay | Admitting: Family Medicine

## 2024-02-25 NOTE — Progress Notes (Signed)
 New Patient Office Visit  Subjective    Patient ID: Desiree Anderson, female    DOB: 12-09-1955  Age: 68 y.o. MRN: [id number]  CC:  Chief Complaint  Patient presents with   Establish Care    Possible uti    HPI Desiree Anderson presents to establish care and for review of chronic med issues including hypertension, prediabetes, hypothyroid, and hyperlipidemia.    Outpatient Encounter Medications as of 02/21/2024  Medication Sig   ALPRAZolam (XANAX) 0.5 MG tablet TAKE 1/2 TO 1 (ONE-HALF TO ONE) TABLET BY MOUTH ONCE DAILY FOR ANXIETY LIMIT TO 5 DAYS A WEEK   Ascorbic Acid (VITAMIN C) 1000 MG tablet Take 1,000 mg by mouth daily.   Blood Glucose Monitoring Suppl w/Device KIT Test blood sugar once daily or as directed.   cetirizine (ZYRTEC) 10 MG tablet Take 10 mg by mouth daily as needed for allergies.   Cholecalciferol (VITAMIN D-3) 5000 UNITS TABS Take 5,000 Units by mouth 2 (two) times daily.    colestipol (COLESTID) 1 g tablet Take 2 tablets by mouth twice daily   dicyclomine (BENTYL) 20 MG tablet Take 1 tablet by mouth three times daily as needed   ezetimibe (ZETIA) 10 MG tablet Take  1 tablet Daily for Cholesterol                                                     /                                                                   TAKE                                         BY                                                 MOUTH   famotidine (PEPCID) 40 MG tablet Take 40 mg by mouth daily as needed for heartburn or indigestion.   fluticasone (FLONASE) 50 MCG/ACT nasal spray USE TWO SPRAY(S) IN EACH NOSTRIL ONCE DAILY AS NEEDED   gabapentin (NEURONTIN) 600 MG tablet Take  1 tablet   3 x /day  for Chronic Pain                                       /                         TAKE                              BY                  MOUTH  glucose blood (FREESTYLE LITE) test strip Test blood sugar once daily or as directed.   hyoscyamine (LEVSIN SL) 0.125 MG SL tablet Take 1 tablet (0.125 mg  total) by mouth every 6 (six) hours as needed.   Lancets MISC Test blood sugar once daily or as directed.   levothyroxine (SYNTHROID) 100 MCG tablet Take  1 tablet  Daily  on an empty stomach with only water for 30 minutes & no Antacid meds, Calcium or Magnesium for 4 hours & avoid Biotin                                /                                                                   TAKE                                         BY                                                 MOUTH   loperamide (IMODIUM) 2 MG capsule Take by mouth as needed for diarrhea or loose stools.   meclizine (ANTIVERT) 25 MG tablet TAKE ONE-HALF TO ONE TABLET BY MOUTH UP TO THREE TIMES DAILY FOR MOTION SICKNESS/DIZZINESS   metFORMIN (GLUCOPHAGE-XR) 500 MG 24 hr tablet Take 2 tablets 2 x/ day with meals  for Diabetes   methocarbamol (ROBAXIN) 500 MG tablet Take 1 tablet (500 mg total) by mouth every 6 (six) hours as needed for muscle spasms.   phentermine (ADIPEX-P) 37.5 MG tablet Take 1/2 to 1 tablet every Morning for Dieting & Weight Loss   potassium chloride SA (KLOR-CON M) 20 MEQ tablet Take  1 tablet  2 x /day  for Potassium                                                  /                                                           TAKE                                             BY                                              MOUTH  rizatriptan (MAXALT) 10 MG tablet Take 1 tablet (10 mg total) by mouth as needed for migraine. May repeat in 2 hours if needed   rosuvastatin (CRESTOR) 10 MG tablet TAKE  1 TABLET   DAILY   FOR CHOLESTEROL                                   /                                                                    TAKE                                         BY                                                 MOUTH                        ONCE ? ? ?  DAILY   venlafaxine XR (EFFEXOR-XR) 37.5 MG 24 hr capsule Take 1 capsule (37.5 mg total) by mouth daily with breakfast.   zinc gluconate 50 MG tablet  Take 50 mg by mouth daily.   [DISCONTINUED] topiramate (TOPAMAX) 50 MG tablet Take 1/2 to 1 tablet 2 x / day - Suppertime & Bedtime - for Dieting & Weight  Loss                                                  /                                                                   TAKE                                         BY                                                 MOUTH   topiramate (TOPAMAX) 50 MG tablet Take 1/2 to 1 tablet 2 x / day - Suppertime & Bedtime - for Dieting & Weight  Loss                                                  /  TAKE                                         BY                                                 MOUTH   No facility-administered encounter medications on file as of 02/21/2024.    Past Medical History:  Diagnosis Date   Anxiety    Breast cancer (HCC)    Complication of anesthesia    hypotension   DDD (degenerative disc disease), lumbar    Diabetes mellitus without complication (HCC)    Erosion of vaginal mesh (HCC)    Fibromyalgia    GERD (gastroesophageal reflux disease)    History of gastric ulcer    History of radiation therapy 07/06/16- 08/22/16   Right Breast   Hypertension    Hypothyroidism    Personal history of radiation therapy    Rectocele     Past Surgical History:  Procedure Laterality Date   ANTERIOR CERVICAL DECOMP/DISCECTOMY FUSION N/A 09/16/2021   Procedure: C5-6 ANTERIOR CERVICAL DISCECTOMY FUSION, ALLOGRAFT, PLATE;  Surgeon: Eldred Manges, MD;  Location: [facility] OR;  Service: Orthopedics;  Laterality: N/A;   BLADDER TACK  2003   BREAST BIOPSY Right 04/18/2016   BREAST LUMPECTOMY Right 05/18/2016   Breast mass removal Left 1985   Benign   CARDIOVASCULAR STRESS TEST  07-02-2013  DR DALTON MCLEAN   LOW RISK NUCLEAR STUDY WITH A SMALL, MILD APICAL SEPTAL FIXED PERFUSION DEFECT .  GIVEN NORMAL WALL FUNCTION , SUSPECT REPRESENTS ATTENUATION/  EF 74%/  NO ISCHEMIA    CATARACT EXTRACTION W/ INTRAOCULAR LENS IMPLANT Left 01/2014   CATARACT EXTRACTION W/ INTRAOCULAR LENS IMPLANT Right 12/2017   Dr. Sherryll Burger   CHOLECYSTECTOMY N/A 12/10/2017   Procedure: LAPAROSCOPIC CHOLECYSTECTOMY;  Surgeon: Emelia Loron, MD;  Location: [facility] OR;  Service: General;  Laterality: N/A;   CYSTO/  HYDRODISTENTION/  BLADDER BX/   INSTILLATION THERAPY  08-29-2010   EXCISION OF MESH N/A 03/23/2014   Procedure: EXCISION OF VAGINAL AND PERI-URETHRAL MESH, EXTRACTION FROM URETHRA TO APEX, insertion of XENFORM in the vaginal vault;  Surgeon: Kathi Ludwig, MD;  Location: [facility];  Service: Urology;  Laterality: N/A;   EXCISION VAGINAL CYST N/A 04/06/2014   Procedure: VAGINAL SPECULUM EXAMINATION/POSSIBLE DRAINAGE OF HEMATOMA;  Surgeon: Kathi Ludwig, MD;  Location: [facility];  Service: Urology;  Laterality: N/A;   INCONTINENCE SURGERY  2009  &  2005   RADIOACTIVE SEED GUIDED PARTIAL MASTECTOMY WITH AXILLARY SENTINEL LYMPH NODE BIOPSY Right 05/18/2016   Procedure: RADIOACTIVE SEED GUIDED RIGHT BREAST LUMPECTOMY WITH AXILLARY SENTINEL LYMPH NODE BIOPSY;  Surgeon: Emelia Loron, MD;  Location: [facility];  Service: General;  Laterality: Right;  RADIOACTIVE SEED GUIDED RIGHT BREAST LUMPECTOMY WITH AXILLARY SENTINEL LYMPH NODE BIOPSY   TRANSTHORACIC ECHOCARDIOGRAM  06-16-2013   GRADE I DIASTOLIC DYSFUNCTION/  EF 55-60%/  MILD AR/  TRIVIAL MR  &  TR   VAGINAL HYSTERECTOMY  2000   w/  unilateral salpingoophorectomy   VEIN BYPASS SURGERY  AGE 31   RIGHT LEG    Family History  Problem Relation Age of Onset   Diabetes Mother    Diverticulitis  Mother    Intestinal polyp Sister    Diverticulitis Sister    Lung cancer Brother    High blood pressure Brother    Liver disease Neg Hx    Colon cancer Neg Hx    Esophageal cancer Neg Hx     Social History   Socioeconomic History   Marital status: Married    Spouse name: Not on  file   Number of children: 2   Years of education: Not on file   Highest education level: Not on file  Occupational History   Occupation: retired  Tobacco Use   Smoking status: Former    Current packs/day: 0.00    Types: Cigarettes    Start date: 11/28/1975    Quit date: 11/28/1995    Years since quitting: 28.2   Smokeless tobacco: Current    Types: Snuff   Tobacco comments:    dips daily  Vaping Use   Vaping status: Never Used  Substance and Sexual Activity   Alcohol use: No   Drug use: No   Sexual activity: Not on file  Other Topics Concern   Not on file  Social History Narrative   Not on file   Social Drivers of Health   Financial Resource Strain: Low Risk  (02/21/2024)   Overall Financial Resource Strain (CARDIA)    Difficulty of Paying Living Expenses: Not hard at all  Food Insecurity: No Food Insecurity (02/21/2024)   Hunger Vital Sign    Worried About Running Out of Food in the Last Year: Never true    Ran Out of Food in the Last Year: Never true  Transportation Needs: No Transportation Needs (02/21/2024)   PRAPARE - Administrator, Civil Service (Medical): No    Lack of Transportation (Non-Medical): No  Physical Activity: Inactive (02/21/2024)   Exercise Vital Sign    Days of Exercise per Week: 0 days    Minutes of Exercise per Session: 0 min  Stress: No Stress Concern Present (02/21/2024)   [employer] of Occupational Health - Occupational Stress Questionnaire    Feeling of Stress : Only a little  Social Connections: Moderately Integrated (02/21/2024)   Social Connection and Isolation Panel [NHANES]    Frequency of Communication with Friends and Family: Not on file    Frequency of Social Gatherings with Friends and Family: More than three times a week    Attends Religious Services: More than 4 times per year    Active Member of [employer] or Organizations: No    Attends Banker Meetings: Never    Marital Status: Married  Catering manager  Violence: Not At Risk (02/21/2024)   Humiliation, Afraid, Rape, and Kick questionnaire    Fear of Current or Ex-Partner: No    Emotionally Abused: No    Physically Abused: No    Sexually Abused: No    Review of Systems  All other systems reviewed and are negative.       Objective   BP 131/84   Pulse 72   Temp 97.9 F (36.6 C) (Oral)   Resp 16   Ht 5\' 3"  (1.6 m)   Wt 172 lb 3.2 oz (78.1 kg)   SpO2 93%   BMI 30.50 kg/m   Physical Exam Vitals and nursing note reviewed.  Constitutional:      General: She is not in acute distress. Cardiovascular:     Rate and Rhythm: Normal rate and regular rhythm.  Pulmonary:     Effort: Pulmonary  effort is normal.     Breath sounds: Normal breath sounds.  Abdominal:     Palpations: Abdomen is soft.     Tenderness: There is no abdominal tenderness.  Musculoskeletal:     Cervical back: Normal range of motion and neck supple.  Neurological:     General: No focal deficit present.     Mental Status: She is alert and oriented to person, place, and time.         Assessment & Plan:   Essential hypertension  Hypothyroidism, unspecified type  Mixed hyperlipidemia  Prediabetes  Obesity (BMI 30.0-34.9) -     Topiramate; Take 1/2 to 1 tablet 2 x / day - Suppertime & Bedtime - for Dieting & Weight  Loss                                                  /                                                                   TAKE                                         BY                                                 MOUTH  Dispense: 180 tablet; Refill: 1  Encounter to establish care     Return in about 6 months (around 08/23/2024) for follow up, chronic med issues.   Tommie Raymond, MD

## 2024-03-20 ENCOUNTER — Telehealth: Payer: Self-pay | Admitting: Family Medicine

## 2024-03-20 NOTE — Telephone Encounter (Addendum)
 Patient needs refill for Alprazolam , and phentermine  and Gabapentin   sent to pharmacy.

## 2024-03-26 ENCOUNTER — Other Ambulatory Visit: Payer: Self-pay | Admitting: Family

## 2024-03-26 ENCOUNTER — Other Ambulatory Visit: Payer: Self-pay | Admitting: Family Medicine

## 2024-03-26 DIAGNOSIS — F419 Anxiety disorder, unspecified: Secondary | ICD-10-CM

## 2024-03-26 MED ORDER — ALPRAZOLAM 0.5 MG PO TABS: ORAL_TABLET | ORAL | 0 refills | Status: DC

## 2024-04-03 ENCOUNTER — Other Ambulatory Visit: Payer: Self-pay | Admitting: Family Medicine

## 2024-04-03 ENCOUNTER — Ambulatory Visit

## 2024-04-03 ENCOUNTER — Encounter: Payer: Medicare Other | Admitting: Internal Medicine

## 2024-04-03 VITALS — BP 132/84 | Ht 63.0 in | Wt 168.0 lb

## 2024-04-03 DIAGNOSIS — Z0000 Encounter for general adult medical examination without abnormal findings: Secondary | ICD-10-CM

## 2024-04-03 DIAGNOSIS — Z5320 Procedure and treatment not carried out because of patient's decision for unspecified reasons: Secondary | ICD-10-CM

## 2024-04-03 DIAGNOSIS — Z2821 Immunization not carried out because of patient refusal: Secondary | ICD-10-CM

## 2024-04-03 NOTE — Progress Notes (Signed)
 Because this visit was a virtual/telehealth visit,  certain criteria was not obtained, such a blood pressure, CBG if applicable, and timed get up and go. Any medications not marked as "taking" were not mentioned during the medication reconciliation part of the visit. Any vitals not documented were not able to be obtained due to this being a telehealth visit or patient was unable to self-report a recent blood pressure reading due to a lack of equipment at home via telehealth. Vitals that have been documented are verbally provided by the patient.   This visit was performed by a medical professional under my direct supervision. I was immediately available for consultation/collaboration. I have reviewed and agree with the Annual Wellness Visit documentation.   Subjective:   Desiree Anderson is a 68 y.o. who presents for a Medicare Wellness preventive visit.  Visit Complete: Virtual I connected with  Desiree Anderson on 04/03/24 by a audio enabled telemedicine application and verified that I am speaking with the correct person using two identifiers.  Patient Location: Home  Provider Location: Home Office  I discussed the limitations of evaluation and management by telemedicine. The patient expressed understanding and agreed to proceed.  Vital Signs: Because this visit was a virtual/telehealth visit, some criteria may be missing or patient reported. Any vitals not documented were not able to be obtained and vitals that have been documented are patient reported.  VideoDeclined- This patient declined Librarian, academic. Therefore the visit was completed with audio only.  Persons Participating in Visit: Patient.  AWV Questionnaire: No: Patient Medicare AWV questionnaire was not completed prior to this visit.  Cardiac Risk Factors include: advanced age (>62men, >18 women);hypertension;dyslipidemia     Objective:    Today's Vitals   04/03/24 1024  BP: 132/84  Weight: 168  lb (76.2 kg)  Height: 5\' 3"  (1.6 m)   Body mass index is 29.76 kg/m.     04/03/2024   10:23 AM 12/13/2022   12:15 PM 09/13/2021    2:30 PM 08/31/2021   11:33 AM 04/08/2021    1:48 PM 07/08/2020   12:31 PM 05/04/2020   11:10 AM  Advanced Directives  Does Patient Have a Medical Advance Directive? No No No No No No Yes  Type of Tax inspector;Living will  Copy of Healthcare Power of Attorney in Chart?       No - copy requested  Would patient like information on creating a medical advance directive? No - Patient declined No - Patient declined No - Patient declined No - Patient declined No - Patient declined No - Patient declined     Current Medications (verified) Outpatient Encounter Medications as of 04/03/2024  Medication Sig   ALPRAZolam  (XANAX ) 0.5 MG tablet TAKE 1/2 TO 1 (ONE-HALF TO ONE) TABLET BY MOUTH ONCE DAILY FOR ANXIETY LIMIT TO 5 DAYS A WEEK   Ascorbic Acid (VITAMIN C) 1000 MG tablet Take 1,000 mg by mouth daily.   Blood Glucose Monitoring Suppl w/Device KIT Test blood sugar once daily or as directed.   cetirizine (ZYRTEC) 10 MG tablet Take 10 mg by mouth daily as needed for allergies.   Cholecalciferol (VITAMIN D -3) 5000 UNITS TABS Take 5,000 Units by mouth 2 (two) times daily.    colestipol  (COLESTID ) 1 g tablet Take 2 tablets by mouth twice daily   dicyclomine  (BENTYL ) 20 MG tablet Take 1 tablet by mouth three times daily as needed   ezetimibe  (ZETIA )  10 MG tablet Take  1 tablet Daily for Cholesterol                                                     /                                                                   TAKE                                         BY                                                 MOUTH   famotidine  (PEPCID ) 40 MG tablet Take 40 mg by mouth daily as needed for heartburn or indigestion.   fluticasone  (FLONASE ) 50 MCG/ACT nasal spray USE TWO SPRAY(S) IN EACH NOSTRIL ONCE DAILY AS NEEDED   gabapentin  (NEURONTIN )  600 MG tablet Take  1 tablet   3 x /day  for Chronic Pain                                       /                         TAKE                              BY                  MOUTH   glucose blood (FREESTYLE LITE) test strip Test blood sugar once daily or as directed.   hyoscyamine  (LEVSIN SL) 0.125 MG SL tablet Take 1 tablet (0.125 mg total) by mouth every 6 (six) hours as needed.   Lancets MISC Test blood sugar once daily or as directed.   levothyroxine  (SYNTHROID ) 100 MCG tablet Take  1 tablet  Daily  on an empty stomach with only water  for 30 minutes & no Antacid meds, Calcium  or Magnesium for 4 hours & avoid Biotin                                /                                                                   TAKE  BY                                                 MOUTH   loperamide (IMODIUM) 2 MG capsule Take by mouth as needed for diarrhea or loose stools.   meclizine  (ANTIVERT ) 25 MG tablet TAKE ONE-HALF TO ONE TABLET BY MOUTH UP TO THREE TIMES DAILY FOR MOTION SICKNESS/DIZZINESS   metFORMIN  (GLUCOPHAGE -XR) 500 MG 24 hr tablet Take 2 tablets 2 x/ day with meals  for Diabetes   methocarbamol  (ROBAXIN ) 500 MG tablet Take 1 tablet (500 mg total) by mouth every 6 (six) hours as needed for muscle spasms.   phentermine  (ADIPEX-P ) 37.5 MG tablet Take 1/2 to 1 tablet every Morning for Dieting & Weight Loss   potassium chloride  SA (KLOR-CON  M) 20 MEQ tablet Take  1 tablet  2 x /day  for Potassium                                                  /                                                           TAKE                                             BY                                              MOUTH   rizatriptan  (MAXALT ) 10 MG tablet Take 1 tablet (10 mg total) by mouth as needed for migraine. May repeat in 2 hours if needed   rosuvastatin  (CRESTOR ) 10 MG tablet TAKE  1 TABLET   DAILY   FOR CHOLESTEROL                                   /                                                                     TAKE                                         BY  MOUTH                        ONCE ? ? ?  DAILY   topiramate  (TOPAMAX ) 50 MG tablet Take 1/2 to 1 tablet 2 x / day - Suppertime & Bedtime - for Dieting & Weight  Loss                                                  /                                                                   TAKE                                         BY                                                 MOUTH   venlafaxine  XR (EFFEXOR -XR) 37.5 MG 24 hr capsule Take 1 capsule (37.5 mg total) by mouth daily with breakfast.   zinc gluconate 50 MG tablet Take 50 mg by mouth daily.   No facility-administered encounter medications on file as of 04/03/2024.    Allergies (verified) Shellfish allergy, Betadine [povidone iodine], and Iodine   History: Past Medical History:  Diagnosis Date   Anxiety    Breast cancer (HCC)    Complication of anesthesia    hypotension   DDD (degenerative disc disease), lumbar    Diabetes mellitus without complication (HCC)    Erosion of vaginal mesh (HCC)    Fibromyalgia    GERD (gastroesophageal reflux disease)    History of gastric ulcer    History of radiation therapy 07/06/16- 08/22/16   Right Breast   Hypertension    Hypothyroidism    Personal history of radiation therapy    Rectocele    Past Surgical History:  Procedure Laterality Date   ANTERIOR CERVICAL DECOMP/DISCECTOMY FUSION N/A 09/16/2021   Procedure: C5-6 ANTERIOR CERVICAL DISCECTOMY FUSION, ALLOGRAFT, PLATE;  Surgeon: Adah Acron, MD;  Location: [facility] OR;  Service: Orthopedics;  Laterality: N/A;   BLADDER TACK  2003   BREAST BIOPSY Right 04/18/2016   BREAST LUMPECTOMY Right 05/18/2016   Breast mass removal Left 1985   Benign   CARDIOVASCULAR STRESS TEST  07-02-2013  DR DALTON MCLEAN   LOW RISK NUCLEAR STUDY WITH A SMALL, MILD APICAL SEPTAL FIXED PERFUSION DEFECT .  GIVEN NORMAL WALL  FUNCTION , SUSPECT REPRESENTS ATTENUATION/  EF 74%/  NO ISCHEMIA   CATARACT EXTRACTION W/ INTRAOCULAR LENS IMPLANT Left 01/2014   CATARACT EXTRACTION W/ INTRAOCULAR LENS IMPLANT Right 12/2017   Dr. Mason Sole   CHOLECYSTECTOMY N/A 12/10/2017   Procedure: LAPAROSCOPIC CHOLECYSTECTOMY;  Surgeon: Enid Harry, MD;  Location: [facility] OR;  Service: General;  Laterality: N/A;   CYSTO/  HYDRODISTENTION/  BLADDER BX/   INSTILLATION THERAPY  08-29-2010  EXCISION OF MESH N/A 03/23/2014   Procedure: EXCISION OF VAGINAL AND PERI-URETHRAL MESH, EXTRACTION FROM URETHRA TO APEX, insertion of XENFORM in the vaginal vault;  Surgeon: Edmund Gouge, MD;  Location: [facility];  Service: Urology;  Laterality: N/A;   EXCISION VAGINAL CYST N/A 04/06/2014   Procedure: VAGINAL SPECULUM EXAMINATION/POSSIBLE DRAINAGE OF HEMATOMA;  Surgeon: Edmund Gouge, MD;  Location: [facility];  Service: Urology;  Laterality: N/A;   INCONTINENCE SURGERY  2009  &  2005   RADIOACTIVE SEED GUIDED PARTIAL MASTECTOMY WITH AXILLARY SENTINEL LYMPH NODE BIOPSY Right 05/18/2016   Procedure: RADIOACTIVE SEED GUIDED RIGHT BREAST LUMPECTOMY WITH AXILLARY SENTINEL LYMPH NODE BIOPSY;  Surgeon: Enid Harry, MD;  Location: [facility];  Service: General;  Laterality: Right;  RADIOACTIVE SEED GUIDED RIGHT BREAST LUMPECTOMY WITH AXILLARY SENTINEL LYMPH NODE BIOPSY   TRANSTHORACIC ECHOCARDIOGRAM  06-16-2013   GRADE I DIASTOLIC DYSFUNCTION/  EF 55-60%/  MILD AR/  TRIVIAL MR  &  TR   VAGINAL HYSTERECTOMY  2000   w/  unilateral salpingoophorectomy   VEIN BYPASS SURGERY  AGE 18   RIGHT LEG   Family History  Problem Relation Age of Onset   Diabetes Mother    Diverticulitis Mother    Intestinal polyp Sister    Diverticulitis Sister    Lung cancer Brother    High blood pressure Brother    Liver disease Neg Hx    Colon cancer Neg Hx    Esophageal cancer Neg Hx    Social History    Socioeconomic History   Marital status: Married    Spouse name: Not on file   Number of children: 2   Years of education: Not on file   Highest education level: Not on file  Occupational History   Occupation: retired  Tobacco Use   Smoking status: Former    Current packs/day: 0.00    Types: Cigarettes    Start date: 11/28/1975    Quit date: 11/28/1995    Years since quitting: 28.3   Smokeless tobacco: Current    Types: Snuff   Tobacco comments:    dips daily  Vaping Use   Vaping status: Never Used  Substance and Sexual Activity   Alcohol use: No   Drug use: No   Sexual activity: Not on file  Other Topics Concern   Not on file  Social History Narrative   Not on file   Social Drivers of Health   Financial Resource Strain: Low Risk  (04/03/2024)   Overall Financial Resource Strain (CARDIA)    Difficulty of Paying Living Expenses: Not hard at all  Food Insecurity: No Food Insecurity (04/03/2024)   Hunger Vital Sign    Worried About Running Out of Food in the Last Year: Never true    Ran Out of Food in the Last Year: Never true  Transportation Needs: No Transportation Needs (04/03/2024)   PRAPARE - Administrator, Civil Service (Medical): No    Lack of Transportation (Non-Medical): No  Physical Activity: Sufficiently Active (04/03/2024)   Exercise Vital Sign    Days of Exercise per Week: 4 days    Minutes of Exercise per Session: 60 min  Recent Concern: Physical Activity - Inactive (02/21/2024)   Exercise Vital Sign    Days of Exercise per Week: 0 days    Minutes of Exercise per Session: 0 min  Stress: No Stress Concern Present (04/03/2024)   [employer] of Occupational Health -  Occupational Stress Questionnaire    Feeling of Stress : Only a little  Social Connections: Moderately Integrated (04/03/2024)   Social Connection and Isolation Panel [NHANES]    Frequency of Communication with Friends and Family: More than three times a week    Frequency of Social  Gatherings with Friends and Family: Three times a week    Attends Religious Services: More than 4 times per year    Active Member of Clubs or Organizations: No    Attends Banker Meetings: Never    Marital Status: Married    Tobacco Counseling Ready to quit: Not Answered Counseling given: Not Answered Tobacco comments: dips daily    Clinical Intake:  Pre-visit preparation completed: Yes  Pain : No/denies pain     BMI - recorded: 29.76 Nutritional Status: BMI 25 -29 Overweight Nutritional Risks: Other (Comment) Diabetes: No  Lab Results  Component Value Date   HGBA1C 6.2 (H) 10/11/2023   HGBA1C 6.1 (H) 03/28/2023   HGBA1C 6.1 (H) 09/11/2022     How often do you need to have someone help you when you read instructions, pamphlets, or other written materials from your doctor or pharmacy?: 1 - Never What is the last grade level you completed in school?: 5 years of college     Information entered by :: [employer]   Activities of Daily Living     04/03/2024   10:28 AM 10/11/2023    1:29 AM  In your present state of health, do you have any difficulty performing the following activities:  Hearing? 0 0  Vision? 0 0  Difficulty concentrating or making decisions? 0 0  Walking or climbing stairs? 0 0  Dressing or bathing? 0 0  Doing errands, shopping? 0 0  Preparing Food and eating ? N   Using the Toilet? N   In the past six months, have you accidently leaked urine? N   Do you have problems with loss of bowel control? N   Managing your Medications? N   Managing your Finances? N   Housekeeping or managing your Housekeeping? N     Patient Care Team: Abraham Abo, MD as PCP - General (Family Medicine) Elmyra Haggard, MD as Consulting Physician (Cardiology) Adah Acron, MD (Inactive) as Consulting Physician (Orthopedic Surgery) Reggy Capers, MD as Consulting Physician (Obstetrics and Gynecology) Magrinat, Rozella Cornfield, MD (Inactive) as Consulting  Physician (Oncology) Enid Harry, MD as Consulting Physician (General Surgery) Beverlee Bucco, MD as Consulting Physician (Radiation Oncology)  Indicate any recent Medical Services you may have received from other than [facility] providers in the past year (date may be approximate).     Assessment:    This is a routine wellness examination for Desiree Anderson.  Hearing/Vision screen Hearing Screening - Comments:: No hearing difficulties Vision Screening - Comments:: Patient wear readers   Goals Addressed             This Visit's Progress    Patient Stated       To get plenty of rest and vacations       Depression Screen     04/03/2024   10:29 AM 02/21/2024   10:03 AM 10/11/2023    1:28 AM 03/20/2023    2:48 PM 12/13/2022   12:15 PM 09/11/2022    1:58 PM 08/31/2021   11:35 AM  PHQ 2/9 Scores  PHQ - 2 Score 0 0 0 0 0 0 0  PHQ- 9 Score 2  Fall Risk     04/03/2024   10:27 AM 02/21/2024   10:05 AM 10/11/2023    1:28 AM 04/19/2023    1:54 PM 03/27/2023   10:28 PM  Fall Risk   Falls in the past year? 0 0 0 0 0  Number falls in past yr: 0 0  0   Injury with Fall? 0 0  0   Risk for fall due to : No Fall Risks No Fall Risks No Fall Risks No Fall Risks No Fall Risks  Follow up Falls prevention discussed;Falls evaluation completed Falls evaluation completed Falls prevention discussed;Education provided;Falls evaluation completed Falls evaluation completed Falls evaluation completed;Education provided;Falls prevention discussed    MEDICARE RISK AT HOME:  Medicare Risk at Home Any stairs in or around the home?: No If so, are there any without handrails?: No Home free of loose throw rugs in walkways, pet beds, electrical cords, etc?: Yes Adequate lighting in your home to reduce risk of falls?: Yes Life alert?: No Use of a cane, walker or w/c?: No Grab bars in the bathroom?: Yes Shower chair or bench in shower?: No Elevated toilet seat or a handicapped toilet?: Yes  TIMED  UP AND GO:  Was the test performed?  No  Cognitive Function: 6CIT completed    05/04/2020   11:15 AM  MMSE - Mini Mental State Exam  Orientation to time 5  Orientation to Place 5  Registration 3  Attention/ Calculation 5  Recall 3  Language- name 2 objects 2  Language- repeat 1  Language- follow 3 step command 3  Language- read & follow direction 1  Write a sentence 1  Copy design 1  Total score 30        04/03/2024   10:26 AM  6CIT Screen  What Year? 0 points  What month? 0 points  What time? 0 points  Count back from 20 0 points  Months in reverse 0 points  Repeat phrase 0 points  Total Score 0 points    Immunizations Immunization History  Administered Date(s) Administered   Influenza Inj Mdck Quad With Preservative 09/04/2017, 09/30/2018, 09/15/2020   Influenza Split 09/18/2013, 09/25/2014, 10/21/2019   Influenza, High Dose Seasonal PF 10/11/2023   Influenza, Seasonal, Injecte, Preservative Fre 10/11/2015   Influenza,inj,Quad PF,6+ Mos 08/31/2021, 09/11/2022   Influenza,inj,quad, With Preservative 08/28/2016, 08/27/2017   Influenza-Unspecified 08/27/2016   PFIZER(Purple Top)SARS-COV-2 Vaccination 02/20/2020, 03/12/2020   Pneumococcal-Unspecified 11/27/2000   Tdap 03/16/2011    Screening Tests Health Maintenance  Topic Date Due   COVID-19 Vaccine (3 - Pfizer risk series) 04/09/2020   DEXA SCAN  Never done   MAMMOGRAM  08/06/2023   Zoster Vaccines- Shingrix (1 of 2) 05/23/2024 (Originally 11/30/1974)   Pneumonia Vaccine 66+ Years old (1 of 2 - PCV) 02/20/2025 (Originally 11/30/1974)   INFLUENZA VACCINE  06/27/2024   Medicare Annual Wellness (AWV)  04/03/2025   HPV VACCINES  Aged Out   Meningococcal B Vaccine  Aged Out   DTaP/Tdap/Td  Discontinued   Hepatitis C Screening  Discontinued   Fecal DNA (Cologuard)  Discontinued    Health Maintenance  Health Maintenance Due  Topic Date Due   COVID-19 Vaccine (3 - Pfizer risk series) 04/09/2020   DEXA SCAN   Never done   MAMMOGRAM  08/06/2023   Health Maintenance Items Addressed:   Additional Screening:  Vision Screening: Recommended annual ophthalmology exams for early detection of glaucoma and other disorders of the eye.  Dental Screening: Recommended annual dental exams for  proper oral hygiene  Community Resource Referral / Chronic Care Management: CRR required this visit?  No   CCM required this visit?  No     Plan:     I have personally reviewed and noted the following in the patient's chart:   Medical and social history Use of alcohol, tobacco or illicit drugs  Current medications and supplements including opioid prescriptions. Patient is not currently taking opioid prescriptions. Functional ability and status Nutritional status Physical activity Advanced directives List of other physicians Hospitalizations, surgeries, and ER visits in previous 12 months Vitals Screenings to include cognitive, depression, and falls Referrals and appointments  In addition, I have reviewed and discussed with patient certain preventive protocols, quality metrics, and best practice recommendations. A written personalized care plan for preventive services as well as general preventive health recommendations were provided to patient.     Freeda Jerry, [place]   04/03/2024   After Visit Summary: (MyChart) Due to this being a telephonic visit, the after visit summary with patients personalized plan was offered to patient via MyChart   Notes: Nothing significant to report at this time.

## 2024-04-03 NOTE — Telephone Encounter (Signed)
 Copied from CRM [id number]. Topic: Clinical - Medication Refill >> Apr 03, 2024 10:41 AM Loreda Rodriguez T wrote: Medication: phentermine  (ADIPEX-P ) 37.5 MG tablet  Has the patient contacted their pharmacy? Yes Advised to get a new script from provider as he prescriber is deceased  This is the patient's preferred pharmacy:  [facility] [address] ([address]),  - [address] [place] [address] Phone: [phone] Fax: [phone]  Is this the correct pharmacy for this prescription? Yes  Has the prescription been filled recently? Yes  Is the patient out of the medication? Yes  Has the patient been seen for an appointment in the last year OR does the patient have an upcoming appointment? Yes  Can we respond through MyChart? Yes  Agent: Please be advised that Rx refills may take up to 3 business days. We ask that you follow-up with your pharmacy.

## 2024-04-03 NOTE — Patient Instructions (Signed)
 Ms. Onofrey , Thank you for taking time to come for your Medicare Wellness Visit. I appreciate your ongoing commitment to your health goals. Please review the following plan we discussed and let me know if I can assist you in the future.   Referrals/Orders/Follow-Ups/Clinician Recommendations: follow up as scheduled for next AWV  This is a list of the screening recommended for you and due dates:  Health Maintenance  Topic Date Due   COVID-19 Vaccine (3 - Pfizer risk series) 04/09/2020   DEXA scan (bone density measurement)  Never done   Mammogram  08/06/2023   Zoster (Shingles) Vaccine (1 of 2) 05/23/2024*   Pneumonia Vaccine (1 of 2 - PCV) 02/20/2025*   Flu Shot  06/27/2024   Medicare Annual Wellness Visit  04/03/2025   HPV Vaccine  Aged Out   Meningitis B Vaccine  Aged Out   DTaP/Tdap/Td vaccine  Discontinued   Hepatitis C Screening  Discontinued   Cologuard (Stool DNA test)  Discontinued  *Topic was postponed. The date shown is not the original due date.    Advanced directives: (Declined) Advance directive discussed with you today. Even though you declined this today, please call our office should you change your mind, and we can give you the proper paperwork for you to fill out.  Next Medicare Annual Wellness Visit scheduled for next year: Yes  Have you seen your provider in the last 6 months (3 months if uncontrolled diabetes)? yes

## 2024-04-07 NOTE — Telephone Encounter (Signed)
 Requested medications are due for refill today.  yes  Requested medications are on the active medications list.  yes  Last refill. 10/17/2023 #90 1 rf  Future visit scheduled.   yes  Notes to clinic.  Refill not delegated.    Requested Prescriptions  Pending Prescriptions Disp Refills   phentermine  (ADIPEX-P ) 37.5 MG tablet 90 tablet 1    Sig: Take 1/2 to 1 tablet every Morning for Dieting & Weight Loss     Not Delegated - Neurology: Anticonvulsants - Controlled - phentermine  hydrochloride Failed - 04/07/2024  7:46 AM      Failed - This refill cannot be delegated      Failed - Valid encounter within last 6 months    Recent Outpatient Visits           1 month ago Essential hypertension   [facility], MD       Future Appointments             In 4 weeks Vangie Genet, MD [facility]            Passed - eGFR in normal range and within 360 days    GFR, Est African American  Date Value Ref Range Status  02/02/2021 116 > OR = 60 mL/min/1.5m2 Final   GFR, Est Non African American  Date Value Ref Range Status  02/02/2021 100 > OR = 60 mL/min/1.40m2 Final   GFR, Estimated  Date Value Ref Range Status  07/11/2021 >60 >60 mL/min Final    Comment:    (NOTE) Calculated using the CKD-EPI Creatinine Equation (2021)    GFR  Date Value Ref Range Status  12/21/2022 94.06 >60.00 mL/min Final    Comment:    Calculated using the CKD-EPI Creatinine Equation (2021)   eGFR  Date Value Ref Range Status  10/11/2023 84 > OR = 60 mL/min/1.57m2 Final         Passed - Cr in normal range and within 360 days    Creatinine  Date Value Ref Range Status  07/03/2017 0.9 0.6 - 1.1 mg/dL Final   Creat  Date Value Ref Range Status  10/11/2023 0.78 0.50 - 1.05 mg/dL Final   Creatinine, Urine  Date Value Ref Range Status  03/28/2023 10 (L) 20 - 275 mg/dL Final         Passed - Last BP in normal  range    BP Readings from Last 1 Encounters:  04/03/24 132/84         Passed - Weight completed in the last 3 months    Wt Readings from Last 1 Encounters:  04/03/24 168 lb (76.2 kg)

## 2024-04-29 DIAGNOSIS — L03031 Cellulitis of right toe: Secondary | ICD-10-CM | POA: Diagnosis not present

## 2024-05-06 ENCOUNTER — Encounter: Payer: Medicare Other | Admitting: Internal Medicine

## 2024-05-22 ENCOUNTER — Other Ambulatory Visit: Payer: Self-pay | Admitting: Family Medicine

## 2024-05-22 DIAGNOSIS — F419 Anxiety disorder, unspecified: Secondary | ICD-10-CM

## 2024-06-02 ENCOUNTER — Telehealth: Payer: Self-pay | Admitting: Family Medicine

## 2024-06-02 NOTE — Telephone Encounter (Signed)
 Pt is requesting refill for gabapentin  and crestor . Pt stated she went to pharmacy to get gabapentin  and pharmacist stated pcp has to send in new order. Please advise.

## 2024-06-04 NOTE — Telephone Encounter (Signed)
New script needed

## 2024-06-05 ENCOUNTER — Ambulatory Visit: Admitting: Family Medicine

## 2024-06-05 ENCOUNTER — Other Ambulatory Visit: Payer: Self-pay | Admitting: Family Medicine

## 2024-06-05 VITALS — BP 135/83 | HR 84 | Ht 63.0 in | Wt 178.4 lb

## 2024-06-05 DIAGNOSIS — H6123 Impacted cerumen, bilateral: Secondary | ICD-10-CM | POA: Diagnosis not present

## 2024-06-05 DIAGNOSIS — F418 Other specified anxiety disorders: Secondary | ICD-10-CM | POA: Diagnosis not present

## 2024-06-05 DIAGNOSIS — R7303 Prediabetes: Secondary | ICD-10-CM

## 2024-06-05 DIAGNOSIS — I10 Essential (primary) hypertension: Secondary | ICD-10-CM | POA: Diagnosis not present

## 2024-06-05 DIAGNOSIS — E782 Mixed hyperlipidemia: Secondary | ICD-10-CM

## 2024-06-05 DIAGNOSIS — M797 Fibromyalgia: Secondary | ICD-10-CM

## 2024-06-05 LAB — POCT GLYCOSYLATED HEMOGLOBIN (HGB A1C): HbA1c, POC (controlled diabetic range): 6.1 % (ref 0.0–7.0)

## 2024-06-05 MED ORDER — ROSUVASTATIN CALCIUM 10 MG PO TABS: ORAL_TABLET | ORAL | 3 refills | Status: AC

## 2024-06-05 MED ORDER — GABAPENTIN 600 MG PO TABS: ORAL_TABLET | ORAL | 3 refills | Status: AC

## 2024-06-05 MED ORDER — TIRZEPATIDE 2.5 MG/0.5ML ~~LOC~~ SOAJ: 2.5000 mg | SUBCUTANEOUS | 0 refills | Status: AC

## 2024-06-05 NOTE — Telephone Encounter (Signed)
 Called patient unable to make contact, voicemail has been left

## 2024-06-05 NOTE — Progress Notes (Signed)
 Established Patient Office Visit  Subjective    Patient ID: Desiree Anderson, female    DOB: 07-Jul-1956  Age: 68 y.o. MRN: [id number]  CC:  Chief Complaint  Patient presents with   Ear Pain    Painful right ear.  Pt states she has not been taking her blood sugar medication because it makes her feel sick but she was on mounjarno at one point and is interested in taking that again    HPI Desiree Anderson presents for follow up of diabetes as well right ear pain. Patient also reports some sleep disturbance after losing her son unexpectedly last month to MI.   Outpatient Encounter Medications as of 06/05/2024  Medication Sig   ALPRAZolam  (XANAX ) 0.5 MG tablet TAKE 1/2 TO 1 (ONE-HALF TO ONE) TABLET BY MOUTH ONCE DAILY FOR ANXIETY LIMIT TO 5 DAYS A WEEK   Ascorbic Acid (VITAMIN C) 1000 MG tablet Take 1,000 mg by mouth daily.   Blood Glucose Monitoring Suppl w/Device KIT Test blood sugar once daily or as directed.   cetirizine (ZYRTEC) 10 MG tablet Take 10 mg by mouth daily as needed for allergies.   Cholecalciferol (VITAMIN D -3) 5000 UNITS TABS Take 5,000 Units by mouth 2 (two) times daily.    colestipol  (COLESTID ) 1 g tablet Take 2 tablets by mouth twice daily   dicyclomine  (BENTYL ) 20 MG tablet Take 1 tablet by mouth three times daily as needed   ezetimibe  (ZETIA ) 10 MG tablet Take  1 tablet Daily for Cholesterol                                                     /                                                                   TAKE                                         BY                                                 MOUTH   famotidine  (PEPCID ) 40 MG tablet Take 40 mg by mouth daily as needed for heartburn or indigestion.   fluticasone  (FLONASE ) 50 MCG/ACT nasal spray USE TWO SPRAY(S) IN EACH NOSTRIL ONCE DAILY AS NEEDED   gabapentin  (NEURONTIN ) 600 MG tablet Take  1 tablet   3 x /day  for Chronic Pain                                       /                         TAKE  BY                  MOUTH   glucose blood (FREESTYLE LITE) test strip Test blood sugar once daily or as directed.   hyoscyamine  (LEVSIN  SL) 0.125 MG SL tablet Take 1 tablet (0.125 mg total) by mouth every 6 (six) hours as needed.   Lancets MISC Test blood sugar once daily or as directed.   levothyroxine  (SYNTHROID ) 100 MCG tablet Take  1 tablet  Daily  on an empty stomach with only water  for 30 minutes & no Antacid meds, Calcium  or Magnesium for 4 hours & avoid Biotin                                /                                                                   TAKE                                         BY                                                 MOUTH   loperamide (IMODIUM) 2 MG capsule Take by mouth as needed for diarrhea or loose stools.   meclizine  (ANTIVERT ) 25 MG tablet TAKE ONE-HALF TO ONE TABLET BY MOUTH UP TO THREE TIMES DAILY FOR MOTION SICKNESS/DIZZINESS   metFORMIN  (GLUCOPHAGE -XR) 500 MG 24 hr tablet Take 2 tablets 2 x/ day with meals  for Diabetes   methocarbamol  (ROBAXIN ) 500 MG tablet Take 1 tablet (500 mg total) by mouth every 6 (six) hours as needed for muscle spasms.   phentermine  (ADIPEX-P ) 37.5 MG tablet Take 1/2 to 1 tablet every Morning for Dieting & Weight Loss   potassium chloride  SA (KLOR-CON  M) 20 MEQ tablet Take  1 tablet  2 x /day  for Potassium                                                  /                                                           TAKE                                             BY  MOUTH   rizatriptan  (MAXALT ) 10 MG tablet Take 1 tablet (10 mg total) by mouth as needed for migraine. May repeat in 2 hours if needed   topiramate  (TOPAMAX ) 50 MG tablet Take 1/2 to 1 tablet 2 x / day - Suppertime & Bedtime - for Dieting & Weight  Loss                                                  /                                                                   TAKE                                          BY                                                 MOUTH   venlafaxine  XR (EFFEXOR -XR) 37.5 MG 24 hr capsule Take 1 capsule (37.5 mg total) by mouth daily with breakfast.   zinc gluconate 50 MG tablet Take 50 mg by mouth daily.   [DISCONTINUED] rosuvastatin  (CRESTOR ) 10 MG tablet TAKE  1 TABLET   DAILY   FOR CHOLESTEROL                                   /                                                                    TAKE                                         BY                                                 MOUTH                        ONCE ? ? ?  DAILY   No facility-administered encounter medications on file as of 06/05/2024.    Past Medical History:  Diagnosis Date   Anxiety    Breast cancer (HCC)    Complication of anesthesia    hypotension   DDD (degenerative disc disease), lumbar    Diabetes mellitus without complication (HCC)    Erosion of vaginal mesh (HCC)  Fibromyalgia    GERD (gastroesophageal reflux disease)    History of gastric ulcer    History of radiation therapy 07/06/16- 08/22/16   Right Breast   Hypertension    Hypothyroidism    Personal history of radiation therapy    Rectocele     Past Surgical History:  Procedure Laterality Date   ANTERIOR CERVICAL DECOMP/DISCECTOMY FUSION N/A 09/16/2021   Procedure: C5-6 ANTERIOR CERVICAL DISCECTOMY FUSION, ALLOGRAFT, PLATE;  Surgeon: Barbarann Oneil BROCKS, MD;  Location: [facility] OR;  Service: Orthopedics;  Laterality: N/A;   BLADDER TACK  2003   BREAST BIOPSY Right 04/18/2016   BREAST LUMPECTOMY Right 05/18/2016   Breast mass removal Left 1985   Benign   CARDIOVASCULAR STRESS TEST  07-02-2013  DR DALTON MCLEAN   LOW RISK NUCLEAR STUDY WITH A SMALL, MILD APICAL SEPTAL FIXED PERFUSION DEFECT .  GIVEN NORMAL WALL FUNCTION , SUSPECT REPRESENTS ATTENUATION/  EF 74%/  NO ISCHEMIA   CATARACT EXTRACTION W/ INTRAOCULAR LENS IMPLANT Left 01/2014   CATARACT EXTRACTION W/ INTRAOCULAR LENS IMPLANT Right 12/2017   Dr. Maree    CHOLECYSTECTOMY N/A 12/10/2017   Procedure: LAPAROSCOPIC CHOLECYSTECTOMY;  Surgeon: Ebbie Cough, MD;  Location[facility] OR;  Service: General;  Laterality: N/A;   CYSTO/  HYDRODISTENTION/  BLADDER BX/   INSTILLATION THERAPY  08-29-2010   EXCISION OF MESH N/A 03/23/2014   Procedure: EXCISION OF VAGINAL AND PERI-URETHRAL MESH, EXTRACTION FROM URETHRA TO APEX, insertion of XENFORM in the vaginal vault;  Surgeon: Arlena LILLETTE Gal, MD;  Location: [facility];  Service: Urology;  Laterality: N/A;   EXCISION VAGINAL CYST N/A 04/06/2014   Procedure: VAGINAL SPECULUM EXAMINATION/POSSIBLE DRAINAGE OF HEMATOMA;  Surgeon: Arlena LILLETTE Gal, MD;  Location: [facility];  Service: Urology;  Laterality: N/A;   INCONTINENCE SURGERY  2009  &  2005   RADIOACTIVE SEED GUIDED PARTIAL MASTECTOMY WITH AXILLARY SENTINEL LYMPH NODE BIOPSY Right 05/18/2016   Procedure: RADIOACTIVE SEED GUIDED RIGHT BREAST LUMPECTOMY WITH AXILLARY SENTINEL LYMPH NODE BIOPSY;  Surgeon: Cough Ebbie, MD;  Location: [facility];  Service: General;  Laterality: Right;  RADIOACTIVE SEED GUIDED RIGHT BREAST LUMPECTOMY WITH AXILLARY SENTINEL LYMPH NODE BIOPSY   TRANSTHORACIC ECHOCARDIOGRAM  06-16-2013   GRADE I DIASTOLIC DYSFUNCTION/  EF 55-60%/  MILD AR/  TRIVIAL MR  &  TR   VAGINAL HYSTERECTOMY  2000   w/  unilateral salpingoophorectomy   VEIN BYPASS SURGERY  AGE 18   RIGHT LEG    Family History  Problem Relation Age of Onset   Diabetes Mother    Diverticulitis Mother    Intestinal polyp Sister    Diverticulitis Sister    Lung cancer Brother    High blood pressure Brother    Liver disease Neg Hx    Colon cancer Neg Hx    Esophageal cancer Neg Hx     Social History   Socioeconomic History   Marital status: Married    Spouse name: Not on file   Number of children: 2   Years of education: Not on file   Highest education level: Not on file  Occupational History   Occupation:  retired  Tobacco Use   Smoking status: Former    Current packs/day: 0.00    Types: Cigarettes    Start date: 11/28/1975    Quit date: 11/28/1995    Years since quitting: 28.5   Smokeless tobacco: Current    Types: Snuff   Tobacco comments:    dips daily  Vaping Use   Vaping status: Never Used  Substance and Sexual Activity   Alcohol use: No   Drug use: No   Sexual activity: Not on file  Other Topics Concern   Not on file  Social History Narrative   Not on file   Social Drivers of Health   Financial Resource Strain: Low Risk  (04/03/2024)   Overall Financial Resource Strain (CARDIA)    Difficulty of Paying Living Expenses: Not hard at all  Food Insecurity: No Food Insecurity (04/03/2024)   Hunger Vital Sign    Worried About Running Out of Food in the Last Year: Never true    Ran Out of Food in the Last Year: Never true  Transportation Needs: No Transportation Needs (04/03/2024)   PRAPARE - Administrator, Civil Service (Medical): No    Lack of Transportation (Non-Medical): No  Physical Activity: Sufficiently Active (04/03/2024)   Exercise Vital Sign    Days of Exercise per Week: 4 days    Minutes of Exercise per Session: 60 min  Recent Concern: Physical Activity - Inactive (02/21/2024)   Exercise Vital Sign    Days of Exercise per Week: 0 days    Minutes of Exercise per Session: 0 min  Stress: No Stress Concern Present (04/03/2024)   [employer] of Occupational Health - Occupational Stress Questionnaire    Feeling of Stress : Only a little  Social Connections: Moderately Integrated (04/03/2024)   Social Connection and Isolation Panel    Frequency of Communication with Friends and Family: More than three times a week    Frequency of Social Gatherings with Friends and Family: Three times a week    Attends Religious Services: More than 4 times per year    Active Member of Clubs or Organizations: No    Attends Banker Meetings: Never    Marital Status:  Married  Catering manager Violence: Not At Risk (04/03/2024)   Humiliation, Afraid, Rape, and Kick questionnaire    Fear of Current or Ex-Partner: No    Emotionally Abused: No    Physically Abused: No    Sexually Abused: No    Review of Systems  All other systems reviewed and are negative.       Objective    BP 135/83   Pulse 84   Ht 5' 3 (1.6 m)   Wt 178 lb 6.4 oz (80.9 kg)   SpO2 96%   BMI 31.60 kg/m   Physical Exam Vitals and nursing note reviewed.  Constitutional:      General: She is not in acute distress. HENT:     Right Ear: There is impacted cerumen.     Left Ear: There is impacted cerumen.  Cardiovascular:     Rate and Rhythm: Normal rate and regular rhythm.  Pulmonary:     Effort: Pulmonary effort is normal.     Breath sounds: Normal breath sounds.  Abdominal:     Palpations: Abdomen is soft.     Tenderness: There is no abdominal tenderness.  Musculoskeletal:     Cervical back: Normal range of motion and neck supple.  Neurological:     General: No focal deficit present.     Mental Status: She is alert and oriented to person, place, and time.  Psychiatric:        Mood and Affect: Mood normal.        Behavior: Behavior normal.         Assessment & Plan:  1. Prediabetes (  Primary) Monjauro prescribed - POCT glycosylated hemoglobin (Hb A1C)  2. Essential hypertension Appears stable. Continue   3. Hearing loss of both ears due to cerumen impaction Resolved after wax removal by irrigation.   4. Situational anxiety Patient continues xanax  prn    No follow-ups on file.   Tanda Raguel SQUIBB, MD

## 2024-06-06 ENCOUNTER — Encounter: Payer: Self-pay | Admitting: Family Medicine

## 2024-06-06 ENCOUNTER — Telehealth: Payer: Self-pay | Admitting: Family Medicine

## 2024-06-06 NOTE — Telephone Encounter (Signed)
 Pt stated Mounjaro  is not covered by insurance. Pt is requesting Wegovy or other weight loss medication that is covered by her insurance. Please advise.

## 2024-06-09 NOTE — Telephone Encounter (Signed)
 Pt called again and stated she called her insurance and they told her pt needs medical documentation from pcp as to why she needs the medication and that may be why it got denied for Mounjaro . Please advise.

## 2024-06-10 ENCOUNTER — Other Ambulatory Visit: Payer: Self-pay

## 2024-06-10 ENCOUNTER — Telehealth: Payer: Self-pay | Admitting: Family Medicine

## 2024-06-10 NOTE — Telephone Encounter (Signed)
 Desiree Anderson,  Have you seen a PA for this medication.

## 2024-06-10 NOTE — Telephone Encounter (Signed)
 Noted

## 2024-06-10 NOTE — Telephone Encounter (Unsigned)
 Copied from CRM [id number]. Topic: Clinical - Prescription Issue >> Jun 10, 2024  3:32 PM Geneva B wrote: Reason for CRM: [employer] is calling to say that an appeal can be sent for the wegovy if there are any questions please call [phone] option 5

## 2024-07-23 ENCOUNTER — Other Ambulatory Visit: Payer: Self-pay | Admitting: Family Medicine

## 2024-07-23 DIAGNOSIS — F419 Anxiety disorder, unspecified: Secondary | ICD-10-CM

## 2024-08-07 ENCOUNTER — Encounter: Payer: Self-pay | Admitting: Physician Assistant

## 2024-08-07 ENCOUNTER — Telehealth: Admitting: Physician Assistant

## 2024-08-07 VITALS — BP 128/91 | HR 83 | Temp 98.2°F | Ht 63.0 in | Wt 175.0 lb

## 2024-08-07 DIAGNOSIS — U071 COVID-19: Secondary | ICD-10-CM | POA: Diagnosis not present

## 2024-08-07 DIAGNOSIS — Z20822 Contact with and (suspected) exposure to covid-19: Secondary | ICD-10-CM | POA: Diagnosis not present

## 2024-08-07 LAB — POC COVID19/FLU A&B COMBO
Covid Antigen, POC: NEGATIVE
Influenza A Antigen, POC: POSITIVE — AB
Influenza B Antigen, POC: NEGATIVE

## 2024-08-07 MED ORDER — METHYLPREDNISOLONE 4 MG PO TBPK: ORAL_TABLET | ORAL | 0 refills | Status: AC

## 2024-08-07 MED ORDER — BENZONATATE 100 MG PO CAPS: ORAL_CAPSULE | ORAL | 0 refills | Status: AC

## 2024-08-07 NOTE — Progress Notes (Signed)
 Virtual Visit Consent   Desiree Anderson, you are scheduled for a virtual visit with a [facility] provider today. Just as with appointments in the office, your consent must be obtained to participate. Your consent will be active for this visit and any virtual visit you may have with one of our providers in the next 365 days. If you have a MyChart account, a copy of this consent can be sent to you electronically.  As this is a virtual visit, video technology does not allow for your provider to perform a traditional examination. This may limit your provider's ability to fully assess your condition. If your provider identifies any concerns that need to be evaluated in person or the need to arrange testing (such as labs, EKG, etc.), we will make arrangements to do so. Although advances in technology are sophisticated, we cannot ensure that it will always work on either your end or our end. If the connection with a video visit is poor, the visit may have to be switched to a telephone visit. With either a video or telephone visit, we are not always able to ensure that we have a secure connection.  By engaging in this virtual visit, you consent to the provision of healthcare and authorize for your insurance to be billed (if applicable) for the services provided during this visit. Depending on your insurance coverage, you may receive a charge related to this service.  I need to obtain your verbal consent now. Are you willing to proceed with your visit today? Desiree Anderson has provided verbal consent on 08/07/2024 for a virtual visit (video or telephone). Kirk GORMAN Sage, PA-C  Date: 08/07/2024 2:09 PM   Virtual Visit via Video Note   I, Desiree Anderson, connected with  Desiree Anderson  ([id number], 07-30-1956) on 08/07/24 at  2:20 PM EDT by a video-enabled telemedicine application and verified that I am speaking with the correct person using two identifiers.  Location: Patient: Virtual Visit Location Patient:  Home Provider: Virtual Visit Location Provider: [facility]    I discussed the limitations of evaluation and management by telemedicine and the availability of in person appointments. The patient expressed understanding and agreed to proceed.    History of Present Illness: Desiree Anderson is a 68 y.o. who identifies as a female who was assigned female at birth, and is being seen today for  with COVID-19 symptoms. She is accompanied by her husband, Desiree Anderson.  She has experienced symptoms for the past 4 days, beginning with a cough and choking sensation on phlegm, initially thought to be allergies. Symptoms progressed to include chills, body aches, lightheadedness, and malaise. She uses Zyrtec, Sudafed, Flonase , Mucinex, Tylenol , and zinc for symptom management. Occasionally, she produces green phlegm, which she expels by brushing her teeth and tongue. Her COVID-19 test is positive. She is concerned about transmitting the virus to her Desiree Anderson, whom she visited recently while maintaining distance. Her blood sugars remain stable.   Problems:  Patient Active Problem List   Diagnosis Date Noted   C. difficile diarrhea 03/20/2023   Cervical spinal stenosis 09/16/2021   Spinal stenosis of cervical region 07/04/2021   Other spondylosis with radiculopathy, cervical region 03/29/2021   Insomnia 03/25/2018   Situational anxiety 03/25/2018   Tobacco chew use 05/15/2016   Malignant neoplasm of upper-outer quadrant of right breast in female, estrogen receptor positive (HCC) 04/21/2016   Hypothyroidism 04/18/2016   Obesity (BMI 30.0-34.9) 10/11/2015   Cystocele, midline 03/23/2014  Essential hypertension 12/10/2013   Mixed hyperlipidemia 12/10/2013   Prediabetes 12/10/2013   Vitamin D  deficiency 12/10/2013   DJD  12/10/2013   DDD, lumbar 12/10/2013   Fibromyalgia Syndrome 12/10/2013    Allergies:  Allergies  Allergen Reactions   Shellfish Allergy Diarrhea and Nausea And Vomiting    Betadine [Povidone Iodine] Rash   Iodine     Pt unsure of reaction    Medications:  Current Outpatient Medications:    ALPRAZolam  (XANAX ) 0.5 MG tablet, TAKE 1/2 TO 1 (ONE-HALF TO ONE) TABLET BY MOUTH ONCE DAILY FOR  ANXIETY  LIMIT  TO  5  DAYS  A  WEEK, Disp: 20 tablet, Rfl: 0   Ascorbic Acid (VITAMIN C) 1000 MG tablet, Take 1,000 mg by mouth daily., Disp: , Rfl:    Blood Glucose Monitoring Suppl w/Device KIT, Test blood sugar once daily or as directed., Disp: 1 kit, Rfl: 0   cetirizine (ZYRTEC) 10 MG tablet, Take 10 mg by mouth daily as needed for allergies., Disp: , Rfl:    Cholecalciferol (VITAMIN D -3) 5000 UNITS TABS, Take 5,000 Units by mouth 2 (two) times daily. , Disp: , Rfl:    colestipol  (COLESTID ) 1 g tablet, Take 2 tablets by mouth twice daily, Disp: 120 tablet, Rfl: 0   dicyclomine  (BENTYL ) 20 MG tablet, Take 1 tablet by mouth three times daily as needed, Disp: 90 tablet, Rfl: 0   ezetimibe  (ZETIA ) 10 MG tablet, Take  1 tablet Daily for Cholesterol                                                     /                                                                   TAKE                                         BY                                                 MOUTH, Disp: 90 tablet, Rfl: 3   famotidine  (PEPCID ) 40 MG tablet, Take 40 mg by mouth daily as needed for heartburn or indigestion., Disp: , Rfl:    fluticasone  (FLONASE ) 50 MCG/ACT nasal spray, USE TWO SPRAY(S) IN EACH NOSTRIL ONCE DAILY AS NEEDED, Disp: 48 g, Rfl: 0   gabapentin  (NEURONTIN ) 600 MG tablet, Take  1 tablet   3 x /day  for Chronic Pain                                       /                         TAKE  BY                  MOUTH, Disp: 270 tablet, Rfl: 3   glucose blood (FREESTYLE LITE) test strip, Test blood sugar once daily or as directed., Disp: 100 each, Rfl: 12   hyoscyamine  (LEVSIN  SL) 0.125 MG SL tablet, Take 1 tablet (0.125 mg total) by mouth every 6 (six) hours as needed.,  Disp: 60 tablet, Rfl: 0   Lancets MISC, Test blood sugar once daily or as directed., Disp: 200 each, Rfl: 11   levothyroxine  (SYNTHROID ) 100 MCG tablet, Take  1 tablet  Daily  on an empty stomach with only water  for 30 minutes & no Antacid meds, Calcium  or Magnesium for 4 hours & avoid Biotin                                /                                                                   TAKE                                         BY                                                 MOUTH, Disp: 90 tablet, Rfl: 3   loperamide (IMODIUM) 2 MG capsule, Take by mouth as needed for diarrhea or loose stools., Disp: , Rfl:    meclizine  (ANTIVERT ) 25 MG tablet, TAKE ONE-HALF TO ONE TABLET BY MOUTH UP TO THREE TIMES DAILY FOR MOTION SICKNESS/DIZZINESS, Disp: 30 tablet, Rfl: 0   metFORMIN  (GLUCOPHAGE -XR) 500 MG 24 hr tablet, Take 2 tablets 2 x/ day with meals  for Diabetes, Disp: 360 tablet, Rfl: 3   methocarbamol  (ROBAXIN ) 500 MG tablet, Take 1 tablet (500 mg total) by mouth every 6 (six) hours as needed for muscle spasms., Disp: 60 tablet, Rfl: 0   phentermine  (ADIPEX-P ) 37.5 MG tablet, Take 1/2 to 1 tablet every Morning for Dieting & Weight Loss, Disp: 90 tablet, Rfl: 1   potassium chloride  SA (KLOR-CON  M) 20 MEQ tablet, Take  1 tablet  2 x /day  for Potassium                                                  /                                                           TAKE  BY                                              MOUTH, Disp: 180 tablet, Rfl: 0   rizatriptan  (MAXALT ) 10 MG tablet, Take 1 tablet (10 mg total) by mouth as needed for migraine. May repeat in 2 hours if needed, Disp: 10 tablet, Rfl: 1   rosuvastatin  (CRESTOR ) 10 MG tablet, TAKE  1 TABLET   DAILY   FOR CHOLESTEROL                                   /                                                                    TAKE                                         BY                                                  MOUTH                        ONCE ? ? ?  DAILY, Disp: 90 tablet, Rfl: 3   tirzepatide  (MOUNJARO ) 2.5 MG/0.5ML Pen, Inject 2.5 mg into the skin once a week., Disp: 2 mL, Rfl: 0   topiramate  (TOPAMAX ) 50 MG tablet, Take 1/2 to 1 tablet 2 x / day - Suppertime & Bedtime - for Dieting & Weight  Loss                                                  /                                                                   TAKE                                         BY                                                 MOUTH, Disp: 180 tablet, Rfl: 1   venlafaxine  XR (EFFEXOR -XR) 37.5 MG 24 hr capsule, Take  1 capsule (37.5 mg total) by mouth daily with breakfast., Disp: 90 capsule, Rfl: 4   zinc gluconate 50 MG tablet, Take 50 mg by mouth daily., Disp: , Rfl:   Observations/Objective: Patient is well-developed, well-nourished in no acute distress.  Resting comfortably  at home.  Head is normocephalic, atraumatic.  No labored breathing.  Speech is clear and coherent with logical content.  Patient is alert and oriented at baseline.   Today's Vitals   08/07/24 1252 08/07/24 1254  BP: (!) 128/91   Pulse: 83   Temp: 98.2 F (36.8 C)   TempSrc: Temporal   Weight: 175 lb (79.4 kg)   Height: 5' 3 (1.6 m)   PainSc: 5  5   PainLoc: Generalized    Body mass index is 31 kg/m.   Assessment and Plan: 1. Close exposure to COVID-19 virus (Primary) - POC Covid19/Flu A&B Antigen   COVID-19 infection with symptoms persisting for four days. Positive test. No significant improvement, risk of pneumonia. Antiviral treatment not indicated due to symptom onset timing. - Prescribe short course of steroid taper. - Prescribe cough medicine. - Advise continuation of supportive care: hydration, healthy diet, zinc. . - Advise notifying Desiree Anderson's care facility about exposure,  Proper masking guidelines given.  Patient does not have access to MyChart, patient education given through teach back method.  Patient  was seen at mobile clinic prior to lunch hour, swabs were taken and patient was given video visit in the afternoon.     Follow Up Instructions: I discussed the assessment and treatment plan with the patient. The patient was provided an opportunity to ask questions and all were answered. The patient agreed with the plan and demonstrated an understanding of the instructions.  A copy of instructions were sent to the patient via MyChart unless otherwise noted below.    The patient was advised to call back or seek an in-person evaluation if the symptoms worsen or if the condition fails to improve as anticipated.    Kirk RAMAN Mayers, PA-C

## 2024-08-25 ENCOUNTER — Ambulatory Visit: Admitting: Family Medicine

## 2024-08-25 ENCOUNTER — Ambulatory Visit: Attending: Family Medicine

## 2024-08-25 ENCOUNTER — Encounter: Payer: Self-pay | Admitting: Family Medicine

## 2024-08-25 VITALS — BP 124/83 | HR 89 | Ht 63.0 in | Wt 174.4 lb

## 2024-08-25 DIAGNOSIS — R7303 Prediabetes: Secondary | ICD-10-CM

## 2024-08-25 DIAGNOSIS — I10 Essential (primary) hypertension: Secondary | ICD-10-CM

## 2024-08-25 DIAGNOSIS — R55 Syncope and collapse: Secondary | ICD-10-CM

## 2024-08-25 DIAGNOSIS — U099 Post covid-19 condition, unspecified: Secondary | ICD-10-CM

## 2024-08-25 DIAGNOSIS — E782 Mixed hyperlipidemia: Secondary | ICD-10-CM | POA: Diagnosis not present

## 2024-08-25 NOTE — Progress Notes (Unsigned)
 EP to read.

## 2024-08-25 NOTE — Progress Notes (Unsigned)
 Established Patient Office Visit  Subjective    Patient ID: Desiree Anderson, female    DOB: Jul 15, 1956  Age: 68 y.o. MRN: [id number]  CC:  Chief Complaint  Patient presents with   Medical Management of Chronic Issues    Pt reports she tested postive for covid 9/11 and she has felt bad ever since     HPI Desiree Anderson presents for routine follow up of chronic med issues including hypertension. Patient reports that she had a recent bout of covid and although most symptoms have resolved she still has malaise and fatigue. Lastly she reports episodes of passing out or near passing out  for several months. Denies known precipitant. Has no resulting injury or trauma.  Outpatient Encounter Medications as of 08/25/2024  Medication Sig   ALPRAZolam  (XANAX ) 0.5 MG tablet TAKE 1/2 TO 1 (ONE-HALF TO ONE) TABLET BY MOUTH ONCE DAILY FOR  ANXIETY  LIMIT  TO  5  DAYS  A  WEEK   Blood Glucose Monitoring Suppl w/Device KIT Test blood sugar once daily or as directed.   cetirizine (ZYRTEC) 10 MG tablet Take 10 mg by mouth daily as needed for allergies.   Cholecalciferol (VITAMIN D -3) 5000 UNITS TABS Take 5,000 Units by mouth 2 (two) times daily.    colestipol  (COLESTID ) 1 g tablet Take 2 tablets by mouth twice daily   dicyclomine  (BENTYL ) 20 MG tablet Take 1 tablet by mouth three times daily as needed   ezetimibe  (ZETIA ) 10 MG tablet Take  1 tablet Daily for Cholesterol                                                     /                                                                   TAKE                                         BY                                                 MOUTH   famotidine  (PEPCID ) 40 MG tablet Take 40 mg by mouth daily as needed for heartburn or indigestion.   fluticasone  (FLONASE ) 50 MCG/ACT nasal spray USE TWO SPRAY(S) IN EACH NOSTRIL ONCE DAILY AS NEEDED   gabapentin  (NEURONTIN ) 600 MG tablet Take  1 tablet   3 x /day  for Chronic Pain                                       /                          TAKE  BY                  MOUTH   glucose blood (FREESTYLE LITE) test strip Test blood sugar once daily or as directed.   hyoscyamine  (LEVSIN  SL) 0.125 MG SL tablet Take 1 tablet (0.125 mg total) by mouth every 6 (six) hours as needed.   Lancets MISC Test blood sugar once daily or as directed.   levothyroxine  (SYNTHROID ) 100 MCG tablet Take  1 tablet  Daily  on an empty stomach with only water  for 30 minutes & no Antacid meds, Calcium  or Magnesium for 4 hours & avoid Biotin                                /                                                                   TAKE                                         BY                                                 MOUTH   loperamide (IMODIUM) 2 MG capsule Take by mouth as needed for diarrhea or loose stools.   meclizine  (ANTIVERT ) 25 MG tablet TAKE ONE-HALF TO ONE TABLET BY MOUTH UP TO THREE TIMES DAILY FOR MOTION SICKNESS/DIZZINESS   methocarbamol  (ROBAXIN ) 500 MG tablet Take 1 tablet (500 mg total) by mouth every 6 (six) hours as needed for muscle spasms.   potassium chloride  SA (KLOR-CON  M) 20 MEQ tablet Take  1 tablet  2 x /day  for Potassium                                                  /                                                           TAKE                                             BY                                              MOUTH   rizatriptan  (MAXALT ) 10 MG tablet Take 1 tablet (10 mg total) by mouth as needed for migraine. May  repeat in 2 hours if needed   rosuvastatin  (CRESTOR ) 10 MG tablet TAKE  1 TABLET   DAILY   FOR CHOLESTEROL                                   /                                                                    TAKE                                         BY                                                 MOUTH                        ONCE ? ? ?  DAILY   topiramate  (TOPAMAX ) 50 MG tablet Take 1/2 to 1 tablet 2 x / day - Suppertime & Bedtime - for Dieting &  Weight  Loss                                                  /                                                                   TAKE                                         BY                                                 MOUTH   zinc gluconate 50 MG tablet Take 50 mg by mouth daily.   Ascorbic Acid (VITAMIN C) 1000 MG tablet Take 1,000 mg by mouth daily. (Patient not taking: Reported on 08/25/2024)   benzonatate  (TESSALON ) 100 MG capsule Take 1-2 caps PO TID PRN (Patient not taking: Reported on 08/25/2024)   metFORMIN  (GLUCOPHAGE -XR) 500 MG 24 hr tablet Take 2 tablets 2 x/ day with meals  for Diabetes (Patient not taking: Reported on 08/25/2024)   methylPREDNISolone  (MEDROL  DOSEPAK) 4 MG TBPK tablet Use per instructions on package (Patient not taking: Reported on 08/25/2024)   phentermine  (ADIPEX-P ) 37.5 MG tablet Take 1/2 to 1 tablet every Morning for Dieting & Weight Loss (Patient  not taking: Reported on 08/25/2024)   tirzepatide  (MOUNJARO ) 2.5 MG/0.5ML Pen Inject 2.5 mg into the skin once a week. (Patient not taking: Reported on 08/25/2024)   venlafaxine  XR (EFFEXOR -XR) 37.5 MG 24 hr capsule Take 1 capsule (37.5 mg total) by mouth daily with breakfast. (Patient not taking: Reported on 08/25/2024)   No facility-administered encounter medications on file as of 08/25/2024.    Past Medical History:  Diagnosis Date   Anxiety    Breast cancer (HCC)    Complication of anesthesia    hypotension   DDD (degenerative disc disease), lumbar    Diabetes mellitus without complication (HCC)    Erosion of vaginal mesh    Fibromyalgia    GERD (gastroesophageal reflux disease)    History of gastric ulcer    History of radiation therapy 07/06/16- 08/22/16   Right Breast   Hypertension    Hypothyroidism    Personal history of radiation therapy    Rectocele     Past Surgical History:  Procedure Laterality Date   ANTERIOR CERVICAL DECOMP/DISCECTOMY FUSION N/A 09/16/2021   Procedure: C5-6 ANTERIOR CERVICAL  DISCECTOMY FUSION, ALLOGRAFT, PLATE;  Surgeon: Barbarann Oneil BROCKS, MD;  Location: [facility] OR;  Service: Orthopedics;  Laterality: N/A;   BLADDER TACK  2003   BREAST BIOPSY Right 04/18/2016   BREAST LUMPECTOMY Right 05/18/2016   Breast mass removal Left 1985   Benign   CARDIOVASCULAR STRESS TEST  07-02-2013  DR DALTON MCLEAN   LOW RISK NUCLEAR STUDY WITH A SMALL, MILD APICAL SEPTAL FIXED PERFUSION DEFECT .  GIVEN NORMAL WALL FUNCTION , SUSPECT REPRESENTS ATTENUATION/  EF 74%/  NO ISCHEMIA   CATARACT EXTRACTION W/ INTRAOCULAR LENS IMPLANT Left 01/2014   CATARACT EXTRACTION W/ INTRAOCULAR LENS IMPLANT Right 12/2017   Dr. Maree   CHOLECYSTECTOMY N/A 12/10/2017   Procedure: LAPAROSCOPIC CHOLECYSTECTOMY;  Surgeon: Ebbie Cough, MD;  Location[facility];  Service: General;  Laterality: N/A;   CYSTO/  HYDRODISTENTION/  BLADDER BX/   INSTILLATION THERAPY  08-29-2010   EXCISION OF MESH N/A 03/23/2014   Procedure: EXCISION OF VAGINAL AND PERI-URETHRAL MESH, EXTRACTION FROM URETHRA TO APEX, insertion of XENFORM in the vaginal vault;  Surgeon: Arlena LILLETTE Gal, MD;  Location: [facility];  Service: Urology;  Laterality: N/A;   EXCISION VAGINAL CYST N/A 04/06/2014   Procedure: VAGINAL SPECULUM EXAMINATION/POSSIBLE DRAINAGE OF HEMATOMA;  Surgeon: Arlena LILLETTE Gal, MD;  Location: [facility];  Service: Urology;  Laterality: N/A;   INCONTINENCE SURGERY  2009  &  2005   RADIOACTIVE SEED GUIDED PARTIAL MASTECTOMY WITH AXILLARY SENTINEL LYMPH NODE BIOPSY Right 05/18/2016   Procedure: RADIOACTIVE SEED GUIDED RIGHT BREAST LUMPECTOMY WITH AXILLARY SENTINEL LYMPH NODE BIOPSY;  Surgeon: Cough Ebbie, MD;  Location: [facility];  Service: General;  Laterality: Right;  RADIOACTIVE SEED GUIDED RIGHT BREAST LUMPECTOMY WITH AXILLARY SENTINEL LYMPH NODE BIOPSY   TRANSTHORACIC ECHOCARDIOGRAM  06-16-2013   GRADE I DIASTOLIC DYSFUNCTION/  EF 55-60%/  MILD AR/  TRIVIAL MR  &  TR    VAGINAL HYSTERECTOMY  2000   w/  unilateral salpingoophorectomy   VEIN BYPASS SURGERY  AGE 53   RIGHT LEG    Family History  Problem Relation Age of Onset   Diabetes Mother    Diverticulitis Mother    Intestinal polyp Sister    Diverticulitis Sister    Lung cancer Brother    High blood pressure Brother    Liver disease Neg Hx    Colon cancer  Neg Hx    Esophageal cancer Neg Hx     Social History   Socioeconomic History   Marital status: Married    Spouse name: Not on file   Number of children: 2   Years of education: Not on file   Highest education level: Not on file  Occupational History   Occupation: retired  Tobacco Use   Smoking status: Former    Current packs/day: 0.00    Types: Cigarettes    Start date: 11/28/1975    Quit date: 11/28/1995    Years since quitting: 28.7   Smokeless tobacco: Current    Types: Snuff   Tobacco comments:    dips daily  Vaping Use   Vaping status: Never Used  Substance and Sexual Activity   Alcohol use: No   Drug use: No   Sexual activity: Not on file  Other Topics Concern   Not on file  Social History Narrative   Not on file   Social Drivers of Health   Financial Resource Strain: Low Risk  (04/03/2024)   Overall Financial Resource Strain (CARDIA)    Difficulty of Paying Living Expenses: Not hard at all  Food Insecurity: No Food Insecurity (04/03/2024)   Hunger Vital Sign    Worried About Running Out of Food in the Last Year: Never true    Ran Out of Food in the Last Year: Never true  Transportation Needs: No Transportation Needs (04/03/2024)   PRAPARE - Administrator, Civil Service (Medical): No    Lack of Transportation (Non-Medical): No  Physical Activity: Sufficiently Active (04/03/2024)   Exercise Vital Sign    Days of Exercise per Week: 4 days    Minutes of Exercise per Session: 60 min  Recent Concern: Physical Activity - Inactive (02/21/2024)   Exercise Vital Sign    Days of Exercise per Week: 0 days     Minutes of Exercise per Session: 0 min  Stress: No Stress Concern Present (04/03/2024)   [employer] of Occupational Health - Occupational Stress Questionnaire    Feeling of Stress : Only a little  Social Connections: Moderately Integrated (04/03/2024)   Social Connection and Isolation Panel    Frequency of Communication with Friends and Family: More than three times a week    Frequency of Social Gatherings with Friends and Family: Three times a week    Attends Religious Services: More than 4 times per year    Active Member of Clubs or Organizations: No    Attends Banker Meetings: Never    Marital Status: Married  Catering manager Violence: Not At Risk (04/03/2024)   Humiliation, Afraid, Rape, and Kick questionnaire    Fear of Current or Ex-Partner: No    Emotionally Abused: No    Physically Abused: No    Sexually Abused: No    Review of Systems  All other systems reviewed and are negative.       Objective    BP 124/83   Pulse 89   Ht 5' 3 (1.6 m)   Wt 174 lb 6.4 oz (79.1 kg)   SpO2 96%   BMI 30.89 kg/m   Physical Exam Vitals and nursing note reviewed.  Constitutional:      General: She is not in acute distress. Cardiovascular:     Rate and Rhythm: Normal rate and regular rhythm.  Pulmonary:     Effort: Pulmonary effort is normal.     Breath sounds: Normal breath sounds.  Abdominal:  Palpations: Abdomen is soft.     Tenderness: There is no abdominal tenderness.  Neurological:     General: No focal deficit present.     Mental Status: She is alert and oriented to person, place, and time.  Psychiatric:        Mood and Affect: Mood normal.        Behavior: Behavior normal.         Assessment & Plan:  1. Post covid-19 condition, unspecified (Primary) Fatigue and general malaise persists - monitor  2. Essential hypertension Appears stable. continue  3. Prediabetes Continue   4. Hyperlipidemia, mixed Continue   5. Syncope,  unspecified syncope type  - LONG TERM MONITOR XT (3-14 DAYS); Future    Return in about 4 weeks (around 09/22/2024).   Tanda Raguel SQUIBB, MD

## 2024-08-26 ENCOUNTER — Encounter: Payer: Self-pay | Admitting: Family Medicine

## 2024-09-19 DIAGNOSIS — R55 Syncope and collapse: Secondary | ICD-10-CM | POA: Diagnosis not present

## 2024-09-22 DIAGNOSIS — R55 Syncope and collapse: Secondary | ICD-10-CM

## 2024-09-23 ENCOUNTER — Ambulatory Visit: Payer: Self-pay | Admitting: Family Medicine

## 2024-09-23 NOTE — Progress Notes (Signed)
 Called and spoke to pt's husband Beryl. Bobby informed me [facility] called them yesterday and scheduled pt for 11/11/24

## 2024-09-28 ENCOUNTER — Other Ambulatory Visit: Payer: Self-pay | Admitting: Family Medicine

## 2024-09-28 DIAGNOSIS — F419 Anxiety disorder, unspecified: Secondary | ICD-10-CM

## 2024-09-29 ENCOUNTER — Other Ambulatory Visit: Payer: Self-pay | Admitting: Family

## 2024-09-29 ENCOUNTER — Other Ambulatory Visit: Payer: Self-pay | Admitting: Family Medicine

## 2024-09-29 DIAGNOSIS — E66811 Obesity, class 1: Secondary | ICD-10-CM

## 2024-10-03 ENCOUNTER — Other Ambulatory Visit: Payer: Self-pay | Admitting: Family Medicine

## 2024-10-03 DIAGNOSIS — E039 Hypothyroidism, unspecified: Secondary | ICD-10-CM

## 2024-10-03 NOTE — Telephone Encounter (Unsigned)
 Copied from CRM [id number]. Topic: Clinical - Medication Refill >> Oct 03, 2024  8:50 AM Ivette P wrote: Medication: levothyroxine  (SYNTHROID ) 100 MCG tablet  Has the patient contacted their pharmacy? Yes (Agent: If no, request that the patient contact the pharmacy for the refill. If patient does not wish to contact the pharmacy document the reason why and proceed with request.) (Agent: If yes, when and what did the pharmacy advise?)  This is the patient's preferred pharmacy:  [facility] [address] ([address] [place] - [address] [place] ([place]) [place] [address] Phone: [phone] Fax: [phone]  Is this the correct pharmacy for this prescription? Yes If no, delete pharmacy and type the correct one.   Has the prescription been filled recently? No  Is the patient out of the medication? Yes  Has the patient been seen for an appointment in the last year OR does the patient have an upcoming appointment? Yes  Can we respond through MyChart? No  Agent: Please be advised that Rx refills may take up to 3 business days. We ask that you follow-up with your pharmacy.

## 2024-10-04 NOTE — Telephone Encounter (Signed)
 Requested medication (s) are due for refill today: Yes  Requested medication (s) are on the active medication list: Yes  Last refill:  08/02/23  Future visit scheduled: Yes  Notes to clinic:  Unable to refill per protocol, last refill by another provider.      Requested Prescriptions  Pending Prescriptions Disp Refills   levothyroxine  (SYNTHROID ) 100 MCG tablet 90 tablet 3    Sig: Take  1 tablet  Daily  on an empty stomach with only water  for 30 minutes & no Antacid meds, Calcium  or Magnesium for 4 hours & avoid Biotin                                /                                                                   TAKE                                         BY                                                 MOUTH     Endocrinology:  Hypothyroid Agents Passed - 10/04/2024  9:11 AM      Passed - TSH in normal range and within 360 days    TSH  Date Value Ref Range Status  10/11/2023 1.33 0.40 - 4.50 mIU/L Final         Passed - Valid encounter within last 12 months    Recent Outpatient Visits           1 month ago Post covid-19 condition, unspecified   [facility], MD   4 months ago Prediabetes   [facility], MD   7 months ago Essential hypertension   [facility], MD       Future Appointments             In 1 month Okey Vina GAILS, MD [facility] [employer] [employer]

## 2024-10-06 ENCOUNTER — Ambulatory Visit: Admitting: Family Medicine

## 2024-10-06 ENCOUNTER — Other Ambulatory Visit: Payer: Self-pay | Admitting: Family Medicine

## 2024-10-06 ENCOUNTER — Encounter: Payer: Self-pay | Admitting: Family Medicine

## 2024-10-06 VITALS — BP 128/83 | HR 67 | Ht 63.0 in | Wt 177.4 lb

## 2024-10-06 DIAGNOSIS — I10 Essential (primary) hypertension: Secondary | ICD-10-CM

## 2024-10-06 DIAGNOSIS — F419 Anxiety disorder, unspecified: Secondary | ICD-10-CM

## 2024-10-06 DIAGNOSIS — E66811 Obesity, class 1: Secondary | ICD-10-CM

## 2024-10-06 DIAGNOSIS — G4700 Insomnia, unspecified: Secondary | ICD-10-CM

## 2024-10-06 DIAGNOSIS — Z23 Encounter for immunization: Secondary | ICD-10-CM | POA: Diagnosis not present

## 2024-10-06 DIAGNOSIS — E782 Mixed hyperlipidemia: Secondary | ICD-10-CM | POA: Diagnosis not present

## 2024-10-06 DIAGNOSIS — E039 Hypothyroidism, unspecified: Secondary | ICD-10-CM

## 2024-10-06 DIAGNOSIS — R0982 Postnasal drip: Secondary | ICD-10-CM

## 2024-10-06 MED ORDER — POTASSIUM CHLORIDE CRYS ER 20 MEQ PO TBCR: EXTENDED_RELEASE_TABLET | ORAL | 3 refills | Status: AC

## 2024-10-06 MED ORDER — LEVOTHYROXINE SODIUM 100 MCG PO TABS: ORAL_TABLET | ORAL | 3 refills | Status: AC

## 2024-10-06 MED ORDER — DOXEPIN HCL 3 MG PO TABS: 3.0000 mg | ORAL_TABLET | Freq: Every evening | ORAL | 3 refills | Status: AC | PRN

## 2024-10-06 MED ORDER — OZEMPIC (0.25 OR 0.5 MG/DOSE) 2 MG/3ML ~~LOC~~ SOPN: 0.2500 mg | PEN_INJECTOR | SUBCUTANEOUS | 0 refills | Status: AC

## 2024-10-07 ENCOUNTER — Encounter: Payer: Self-pay | Admitting: Family Medicine

## 2024-10-07 NOTE — Progress Notes (Signed)
 Established Patient Office Visit  Subjective    Patient ID: Desiree Anderson, female    DOB: Jun 13, 1956  Age: 68 y.o. MRN: [id number]  CC:  Chief Complaint  Patient presents with   Medical Management of Chronic Issues    HPI Desiree Anderson presents for routine follow up of chronic med issues including hypertension and hypothyroidism. Patient reports med compliance. Patient also reports insomnia.    Outpatient Encounter Medications as of 10/06/2024  Medication Sig   ALPRAZolam  (XANAX ) 0.5 MG tablet TAKE 1/2 (ONE-HALF) TABLET BY MOUTH ONCE DAILY FOR ANXIETY LIMIT TO 5 DAYS A WEEK   Ascorbic Acid (VITAMIN C) 1000 MG tablet Take 1,000 mg by mouth daily.   Blood Glucose Monitoring Suppl w/Device KIT Test blood sugar once daily or as directed.   cetirizine (ZYRTEC) 10 MG tablet Take 10 mg by mouth daily as needed for allergies.   Cholecalciferol (VITAMIN D -3) 5000 UNITS TABS Take 5,000 Units by mouth 2 (two) times daily.    colestipol  (COLESTID ) 1 g tablet Take 2 tablets by mouth twice daily   dicyclomine  (BENTYL ) 20 MG tablet Take 1 tablet by mouth three times daily as needed   Doxepin HCl 3 MG TABS Take 1 tablet (3 mg total) by mouth at bedtime as needed.   ezetimibe  (ZETIA ) 10 MG tablet Take  1 tablet Daily for Cholesterol                                                     /                                                                   TAKE                                         BY                                                 MOUTH   famotidine  (PEPCID ) 40 MG tablet Take 40 mg by mouth daily as needed for heartburn or indigestion.   fluticasone  (FLONASE ) 50 MCG/ACT nasal spray USE TWO SPRAY(S) IN EACH NOSTRIL ONCE DAILY AS NEEDED   gabapentin  (NEURONTIN ) 600 MG tablet Take  1 tablet   3 x /day  for Chronic Pain                                       /                         TAKE                              BY  MOUTH   glucose blood (FREESTYLE LITE) test strip Test  blood sugar once daily or as directed.   hyoscyamine  (LEVSIN  SL) 0.125 MG SL tablet Take 1 tablet (0.125 mg total) by mouth every 6 (six) hours as needed.   Lancets MISC Test blood sugar once daily or as directed.   loperamide (IMODIUM) 2 MG capsule Take by mouth as needed for diarrhea or loose stools.   meclizine  (ANTIVERT ) 25 MG tablet TAKE ONE-HALF TO ONE TABLET BY MOUTH UP TO THREE TIMES DAILY FOR MOTION SICKNESS/DIZZINESS   methocarbamol  (ROBAXIN ) 500 MG tablet Take 1 tablet (500 mg total) by mouth every 6 (six) hours as needed for muscle spasms.   rizatriptan  (MAXALT ) 10 MG tablet Take 1 tablet (10 mg total) by mouth as needed for migraine. May repeat in 2 hours if needed   rosuvastatin  (CRESTOR ) 10 MG tablet TAKE  1 TABLET   DAILY   FOR CHOLESTEROL                                   /                                                                    TAKE                                         BY                                                 MOUTH                        ONCE ? ? ?  DAILY   Semaglutide ,0.25 or 0.5MG /DOS, (OZEMPIC, 0.25 OR 0.5 MG/DOSE,) 2 MG/3ML SOPN Inject 0.25 mg into the skin once a week.   topiramate  (TOPAMAX ) 50 MG tablet TAKE 1/2 TO 1 (ONE-HALF TO ONE) TABLET BY MOUTH TWICE DAILY (SUPPERTIME  &  BEDTIME)  FOR  DIETING  &  WEIGHT  LOSS   [DISCONTINUED] levothyroxine  (SYNTHROID ) 100 MCG tablet Take  1 tablet  Daily  on an empty stomach with only water  for 30 minutes & no Antacid meds, Calcium  or Magnesium for 4 hours & avoid Biotin                                /                                                                   TAKE  BY                                                 MOUTH   benzonatate  (TESSALON ) 100 MG capsule Take 1-2 caps PO TID PRN (Patient not taking: Reported on 10/06/2024)   levothyroxine  (SYNTHROID ) 100 MCG tablet Take  1 tablet  Daily  on an empty stomach with only water  for 30 minutes & no Antacid meds,  Calcium  or Magnesium for 4 hours & avoid Biotin                                /                                                                   TAKE                                         BY                                                 MOUTH   metFORMIN  (GLUCOPHAGE -XR) 500 MG 24 hr tablet Take 2 tablets 2 x/ day with meals  for Diabetes (Patient not taking: Reported on 10/06/2024)   methylPREDNISolone  (MEDROL  DOSEPAK) 4 MG TBPK tablet Use per instructions on package (Patient not taking: Reported on 10/06/2024)   phentermine  (ADIPEX-P ) 37.5 MG tablet Take 1/2 to 1 tablet every Morning for Dieting & Weight Loss (Patient not taking: Reported on 10/06/2024)   potassium chloride  SA (KLOR-CON  M) 20 MEQ tablet Take  1 tablet  2 x /day  for Potassium                                                  /                                                           TAKE                                             BY                                              MOUTH   tirzepatide  (MOUNJARO ) 2.5 MG/0.5ML Pen Inject 2.5 mg into the  skin once a week. (Patient not taking: Reported on 10/06/2024)   venlafaxine  XR (EFFEXOR -XR) 37.5 MG 24 hr capsule Take 1 capsule (37.5 mg total) by mouth daily with breakfast. (Patient not taking: Reported on 10/06/2024)   zinc gluconate 50 MG tablet Take 50 mg by mouth daily. (Patient not taking: Reported on 10/06/2024)   [DISCONTINUED] potassium chloride  SA (KLOR-CON  M) 20 MEQ tablet Take  1 tablet  2 x /day  for Potassium                                                  /                                                           TAKE                                             BY                                              MOUTH   No facility-administered encounter medications on file as of 10/06/2024.    Past Medical History:  Diagnosis Date   Anxiety    Breast cancer (HCC)    Complication of anesthesia    hypotension   DDD (degenerative disc disease), lumbar    Diabetes  mellitus without complication (HCC)    Erosion of vaginal mesh    Fibromyalgia    GERD (gastroesophageal reflux disease)    History of gastric ulcer    History of radiation therapy 07/06/16- 08/22/16   Right Breast   Hypertension    Hypothyroidism    Personal history of radiation therapy    Rectocele     Past Surgical History:  Procedure Laterality Date   ANTERIOR CERVICAL DECOMP/DISCECTOMY FUSION N/A 09/16/2021   Procedure: C5-6 ANTERIOR CERVICAL DISCECTOMY FUSION, ALLOGRAFT, PLATE;  Surgeon: Barbarann Oneil BROCKS, MD;  Location: [facility] OR;  Service: Orthopedics;  Laterality: N/A;   BLADDER TACK  2003   BREAST BIOPSY Right 04/18/2016   BREAST LUMPECTOMY Right 05/18/2016   Breast mass removal Left 1985   Benign   CARDIOVASCULAR STRESS TEST  07-02-2013  DR DALTON MCLEAN   LOW RISK NUCLEAR STUDY WITH A SMALL, MILD APICAL SEPTAL FIXED PERFUSION DEFECT .  GIVEN NORMAL WALL FUNCTION , SUSPECT REPRESENTS ATTENUATION/  EF 74%/  NO ISCHEMIA   CATARACT EXTRACTION W/ INTRAOCULAR LENS IMPLANT Left 01/2014   CATARACT EXTRACTION W/ INTRAOCULAR LENS IMPLANT Right 12/2017   Dr. Maree   CHOLECYSTECTOMY N/A 12/10/2017   Procedure: LAPAROSCOPIC CHOLECYSTECTOMY;  Surgeon: Ebbie Cough, MD;  [facility];  Service: General;  Laterality: N/A;   CYSTO/  HYDRODISTENTION/  BLADDER BX/   INSTILLATION THERAPY  08-29-2010   EXCISION OF MESH N/A 03/23/2014   Procedure: EXCISION OF VAGINAL AND PERI-URETHRAL MESH, EXTRACTION FROM URETHRA TO APEX, insertion of XENFORM in the vaginal vault;  Surgeon: Arlena FERNS  Chales, MD;  Location[facility];  Service: Urology;  Laterality: N/A;   EXCISION VAGINAL CYST N/A 04/06/2014   Procedure: VAGINAL SPECULUM EXAMINATION/POSSIBLE DRAINAGE OF HEMATOMA;  Surgeon: Arlena LILLETTE Chales, MD;  Location: [facility];  Service: Urology;  Laterality: N/A;   INCONTINENCE SURGERY  2009  &  2005   RADIOACTIVE SEED GUIDED PARTIAL MASTECTOMY WITH AXILLARY  SENTINEL LYMPH NODE BIOPSY Right 05/18/2016   Procedure: RADIOACTIVE SEED GUIDED RIGHT BREAST LUMPECTOMY WITH AXILLARY SENTINEL LYMPH NODE BIOPSY;  Surgeon: Donnice Bury, MD;  Location: [facility];  Service: General;  Laterality: Right;  RADIOACTIVE SEED GUIDED RIGHT BREAST LUMPECTOMY WITH AXILLARY SENTINEL LYMPH NODE BIOPSY   TRANSTHORACIC ECHOCARDIOGRAM  06-16-2013   GRADE I DIASTOLIC DYSFUNCTION/  EF 55-60%/  MILD AR/  TRIVIAL MR  &  TR   VAGINAL HYSTERECTOMY  2000   w/  unilateral salpingoophorectomy   VEIN BYPASS SURGERY  AGE 49   RIGHT LEG    Family History  Problem Relation Age of Onset   Diabetes Mother    Diverticulitis Mother    Intestinal polyp Sister    Diverticulitis Sister    Lung cancer Brother    High blood pressure Brother    Liver disease Neg Hx    Colon cancer Neg Hx    Esophageal cancer Neg Hx     Social History   Socioeconomic History   Marital status: Married    Spouse name: Not on file   Number of children: 2   Years of education: Not on file   Highest education level: Not on file  Occupational History   Occupation: retired  Tobacco Use   Smoking status: Former    Current packs/day: 0.00    Types: Cigarettes    Start date: 11/28/1975    Quit date: 11/28/1995    Years since quitting: 28.8   Smokeless tobacco: Current    Types: Snuff   Tobacco comments:    dips daily  Vaping Use   Vaping status: Never Used  Substance and Sexual Activity   Alcohol use: No   Drug use: No   Sexual activity: Not on file  Other Topics Concern   Not on file  Social History Narrative   Not on file   Social Drivers of Health   Financial Resource Strain: Low Risk  (04/03/2024)   Overall Financial Resource Strain (CARDIA)    Difficulty of Paying Living Expenses: Not hard at all  Food Insecurity: No Food Insecurity (04/03/2024)   Hunger Vital Sign    Worried About Running Out of Food in the Last Year: Never true    Ran Out of Food in the Last Year:  Never true  Transportation Needs: No Transportation Needs (04/03/2024)   PRAPARE - Administrator, Civil Service (Medical): No    Lack of Transportation (Non-Medical): No  Physical Activity: Sufficiently Active (04/03/2024)   Exercise Vital Sign    Days of Exercise per Week: 4 days    Minutes of Exercise per Session: 60 min  Recent Concern: Physical Activity - Inactive (02/21/2024)   Exercise Vital Sign    Days of Exercise per Week: 0 days    Minutes of Exercise per Session: 0 min  Stress: No Stress Concern Present (04/03/2024)   [employer] of Occupational Health - Occupational Stress Questionnaire    Feeling of Stress : Only a little  Social Connections: Moderately Integrated (04/03/2024)   Social Connection and Isolation Panel  Frequency of Communication with Friends and Family: More than three times a week    Frequency of Social Gatherings with Friends and Family: Three times a week    Attends Religious Services: More than 4 times per year    Active Member of Clubs or Organizations: No    Attends Banker Meetings: Never    Marital Status: Married  Catering Manager Violence: Not At Risk (04/03/2024)   Humiliation, Afraid, Rape, and Kick questionnaire    Fear of Current or Ex-Partner: No    Emotionally Abused: No    Physically Abused: No    Sexually Abused: No    Review of Systems  All other systems reviewed and are negative.       Objective    BP 128/83   Pulse 67   Ht 5' 3 (1.6 m)   Wt 177 lb 6.4 oz (80.5 kg)   SpO2 97%   BMI 31.42 kg/m   Physical Exam Vitals and nursing note reviewed.  Constitutional:      General: She is not in acute distress. Cardiovascular:     Rate and Rhythm: Normal rate and regular rhythm.  Pulmonary:     Effort: Pulmonary effort is normal.     Breath sounds: Normal breath sounds.  Abdominal:     Palpations: Abdomen is soft.     Tenderness: There is no abdominal tenderness.  Musculoskeletal:     Cervical  back: Normal range of motion and neck supple.  Neurological:     General: No focal deficit present.     Mental Status: She is alert and oriented to person, place, and time.         Assessment & Plan:   1. Hypothyroidism, unspecified type (Primary)  - levothyroxine  (SYNTHROID ) 100 MCG tablet; Take  1 tablet  Daily  on an empty stomach with only water  for 30 minutes & no Antacid meds, Calcium  or Magnesium for 4 hours & avoid Biotin                                /                                                                   TAKE                                         BY                                                 MOUTH  Dispense: 90 tablet; Refill: 3  2. Essential hypertension Appears stable. Continue   3. Anxiety Appears stable. Continue   4. Hyperlipidemia, mixed Continue   5. Post-nasal drainage Continue flonase    6. Insomnia, unspecified type Doxepin prescribed.   7. Obesity (BMI 30.0-34.9)   8. Encounter for immunization  - Flu vaccine HIGH DOSE PF(Fluzone Trivalent)  Return in about 3 months (around 01/06/2025) for follow up.  Tanda Raguel SQUIBB, MD

## 2024-10-08 NOTE — Telephone Encounter (Signed)
 Requested medication (s) are due for refill today: yes  Requested medication (s) are on the active medication list: yes  Last refill:  10/06/24  Future visit scheduled: no  Notes to clinic:   Pharmacy comment: NDC not covered by insurance.        Requested Prescriptions  Pending Prescriptions Disp Refills   Doxepin HCl 3 MG TABS [Pharmacy Med Name: DOXEPIN 3MG          TAB] 30 tablet 3    Sig: TAKE 1 TABLET BY MOUTH AT BEDTIME AS NEEDED     Psychiatry:  Antidepressants - Heterocyclics (TCAs) Passed - 10/08/2024 12:00 PM      Passed - Valid encounter within last 6 months    Recent Outpatient Visits           2 days ago Hypothyroidism, unspecified type   [facility], MD   1 month ago Post covid-19 condition, unspecified   [facility], MD   4 months ago Prediabetes   [facility] [facility], MD   7 months ago Essential hypertension   [facility], MD       Future Appointments             In 1 month Okey, Vina GAILS, MD [facility], H&V           Psychiatry:  Antidepressants - Heterocyclics (TCAs) - doxepin Passed - 10/08/2024 12:00 PM      Passed - Valid encounter within last 12 months    Recent Outpatient Visits           2 days ago Hypothyroidism, unspecified type   [facility], MD   1 month ago Post covid-19 condition, unspecified   [place] [facility], MD   4 months ago Prediabetes   [place] [facility], MD   7 months ago Essential hypertension   [facility] [facility], MD       Future Appointments             In 1 month Okey Vina GAILS, MD [facility].  [facility] [employer], H&V

## 2024-10-29 ENCOUNTER — Other Ambulatory Visit: Payer: Self-pay | Admitting: Physician Assistant

## 2024-10-29 DIAGNOSIS — R197 Diarrhea, unspecified: Secondary | ICD-10-CM

## 2024-11-09 NOTE — Progress Notes (Unsigned)
 Desiree Anderson is a 68 yo with hx of CP   I saw her back in 2014 for CP    2014 Echo showed normal LV function MIld AI 2014 Myoview showed normal perfusion   Sept 2025 the Anderson said she started having spells of dizziness, like going to pass out   Episodes come on all of a sudden, not with any particular activity or with change in position.   Denies syncope  Episodes last about a minute  In October she wore a Zio patch   One spell of dizziness was signficant  Swe a at fair  Walking   Went to bathroom   There she felt like she couldn't breathe  Felt like going to pas out   Episode subsided   She felt so bad that she did not press button to trigger    She thinks it was first day of fair  ? THursday in early October  Resolved in a few minutes   Anderson had a Zio patch in OCt 2025   Had less than 1% afib / flutter   Longest episode 3 min 31 seconds    After wearing patch she had COVID  SHe says since then she has not had any spells of severe dizziness  SHe still gets SOB with exertion and occasionally weak  She has cut back on activity since October     Has GI symptoms  FOod gets stuck, also has a lot of reflux  Will take hyocyamine as well as nexium    Diet: Br  Skip Lunch  Vegetables, baked chicken Loves bread, loves rice   little pasta Dinner  Very litte due to GI issues and reflux at night if eats  Drinks water  No snacks  Allergies  Allergen Reactions   Shellfish Allergy Diarrhea and Nausea And Vomiting   Betadine [Povidone Iodine] Rash   Iodine     Anderson unsure of reaction     Current Outpatient Medications  Medication Sig Dispense Refill   ALPRAZolam  (XANAX ) 0.5 MG tablet TAKE 1/2 (ONE-HALF) TABLET BY MOUTH ONCE DAILY FOR ANXIETY LIMIT TO 5 DAYS A WEEK 20 tablet 0   Ascorbic Acid (VITAMIN C) 1000 MG tablet Take 1,000 mg by mouth daily.     benzonatate  (TESSALON ) 100 MG capsule Take 1-2 caps PO TID PRN 20 capsule 0   Blood Glucose Monitoring Suppl w/Device KIT Test blood sugar once daily or as  directed. 1 kit 0   cetirizine (ZYRTEC) 10 MG tablet Take 10 mg by mouth daily as needed for allergies.     Cholecalciferol (VITAMIN D -3) 5000 UNITS TABS Take 5,000 Units by mouth 2 (two) times daily.      colestipol  (COLESTID ) 1 g tablet Take 2 tablets by mouth twice daily 120 tablet 0   dicyclomine  (BENTYL ) 20 MG tablet Take 1 tablet by mouth three times daily as needed 90 tablet 0   Doxepin  HCl 3 MG TABS Take 1 tablet (3 mg total) by mouth at bedtime as needed. 30 tablet 3   ezetimibe  (ZETIA ) 10 MG tablet Take  1 tablet Daily for Cholesterol                                                     /  TAKE                                         BY                                                 MOUTH 90 tablet 3   famotidine  (PEPCID ) 40 MG tablet Take 40 mg by mouth daily as needed for heartburn or indigestion.     fluticasone  (FLONASE ) 50 MCG/ACT nasal spray USE TWO SPRAY(S) IN EACH NOSTRIL ONCE DAILY AS NEEDED 48 g 0   gabapentin  (NEURONTIN ) 600 MG tablet Take  1 tablet   3 x /day  for Chronic Pain                                       /                         TAKE                              BY                  MOUTH 270 tablet 3   glucose blood (FREESTYLE LITE) test strip Test blood sugar once daily or as directed. 100 each 12   hyoscyamine  (LEVSIN  SL) 0.125 MG SL tablet Take 1 tablet (0.125 mg total) by mouth every 6 (six) hours as needed. 60 tablet 0   Lancets MISC Test blood sugar once daily or as directed. 200 each 11   levothyroxine  (SYNTHROID ) 100 MCG tablet Take  1 tablet  Daily  on an empty stomach with only water  for 30 minutes & no Antacid meds, Calcium  or Magnesium for 4 hours & avoid Biotin                                /                                                                   TAKE                                         BY                                                 MOUTH 90 tablet 3   loperamide (IMODIUM)  2 MG capsule Take by mouth as needed for diarrhea or loose stools.     meclizine  (ANTIVERT ) 25 MG tablet TAKE ONE-HALF TO ONE TABLET BY MOUTH UP TO THREE TIMES DAILY FOR  MOTION SICKNESS/DIZZINESS 30 tablet 0   metFORMIN  (GLUCOPHAGE -XR) 500 MG 24 hr tablet Take 2 tablets 2 x/ day with meals  for Diabetes 360 tablet 3   methocarbamol  (ROBAXIN ) 500 MG tablet Take 1 tablet (500 mg total) by mouth every 6 (six) hours as needed for muscle spasms. 60 tablet 0   potassium chloride  SA (KLOR-CON  M) 20 MEQ tablet Take  1 tablet  2 x /day  for Potassium                                                  /                                                           TAKE                                             BY                                              MOUTH 180 tablet 3   rizatriptan  (MAXALT ) 10 MG tablet Take 1 tablet (10 mg total) by mouth as needed for migraine. May repeat in 2 hours if needed 10 tablet 1   rosuvastatin  (CRESTOR ) 10 MG tablet TAKE  1 TABLET   DAILY   FOR CHOLESTEROL                                   /                                                                    TAKE                                         BY                                                 MOUTH                        ONCE ? ? ?  DAILY 90 tablet 3   topiramate  (TOPAMAX ) 50 MG tablet TAKE 1/2 TO 1 (ONE-HALF TO ONE) TABLET BY MOUTH TWICE DAILY (SUPPERTIME  &  BEDTIME)  FOR  DIETING  &  WEIGHT  LOSS 180 tablet 0   venlafaxine  XR (EFFEXOR -XR) 37.5 MG 24 hr capsule Take 1  capsule (37.5 mg total) by mouth daily with breakfast. 90 capsule 4   zinc gluconate 50 MG tablet Take 50 mg by mouth daily.     methylPREDNISolone  (MEDROL  DOSEPAK) 4 MG TBPK tablet Use per instructions on package (Patient not taking: Reported on 11/11/2024) 21 tablet 0   phentermine  (ADIPEX-P ) 37.5 MG tablet Take 1/2 to 1 tablet every Morning for Dieting & Weight Loss (Patient not taking: Reported on 11/11/2024) 90 tablet 1   Semaglutide ,0.25 or 0.5MG /DOS,  (OZEMPIC , 0.25 OR 0.5 MG/DOSE,) 2 MG/3ML SOPN Inject 0.25 mg into the skin once a week. (Patient not taking: Reported on 11/11/2024) 3 mL 0   tirzepatide  (MOUNJARO ) 2.5 MG/0.5ML Pen Inject 2.5 mg into the skin once a week. (Patient not taking: Reported on 11/11/2024) 2 mL 0   No current facility-administered medications for this visit.    Past Medical History:  Diagnosis Date   Anxiety    Breast cancer (HCC)    Complication of anesthesia    hypotension   DDD (degenerative disc disease), lumbar    Diabetes mellitus without complication (HCC)    Erosion of vaginal mesh    Fibromyalgia    GERD (gastroesophageal reflux disease)    History of gastric ulcer    History of radiation therapy 07/06/16- 08/22/16   Right Breast   Hypertension    Hypothyroidism    Personal history of radiation therapy    Rectocele     Past Surgical History:  Procedure Laterality Date   ANTERIOR CERVICAL DECOMP/DISCECTOMY FUSION N/A 09/16/2021   Procedure: C5-6 ANTERIOR CERVICAL DISCECTOMY FUSION, ALLOGRAFT, PLATE;  Surgeon: Barbarann Oneil BROCKS, MD;  Location: [facility] OR;  Service: Orthopedics;  Laterality: N/A;   BLADDER TACK  2003   BREAST BIOPSY Right 04/18/2016   BREAST LUMPECTOMY Right 05/18/2016   Breast mass removal Left 1985   Benign   CARDIOVASCULAR STRESS TEST  07-02-2013  DR DALTON MCLEAN   LOW RISK NUCLEAR STUDY WITH A SMALL, MILD APICAL SEPTAL FIXED PERFUSION DEFECT .  GIVEN NORMAL WALL FUNCTION , SUSPECT REPRESENTS ATTENUATION/  EF 74%/  NO ISCHEMIA   CATARACT EXTRACTION W/ INTRAOCULAR LENS IMPLANT Left 01/2014   CATARACT EXTRACTION W/ INTRAOCULAR LENS IMPLANT Right 12/2017   Dr. Maree   CHOLECYSTECTOMY N/A 12/10/2017   Procedure: LAPAROSCOPIC CHOLECYSTECTOMY;  Surgeon: Ebbie Cough, MD;  Location: [facility] OR;  Service: General;  Laterality: N/A;   CYSTO/  HYDRODISTENTION/  BLADDER BX/   INSTILLATION THERAPY  08-29-2010   EXCISION OF MESH N/A 03/23/2014   Procedure: EXCISION OF VAGINAL AND PERI-URETHRAL  MESH, EXTRACTION FROM URETHRA TO APEX, insertion of XENFORM in the vaginal vault;  Surgeon: Arlena LILLETTE Gal, MD;  Location: [facility];  Service: Urology;  Laterality: N/A;   EXCISION VAGINAL CYST N/A 04/06/2014   Procedure: VAGINAL SPECULUM EXAMINATION/POSSIBLE DRAINAGE OF HEMATOMA;  Surgeon: Arlena LILLETTE Gal, MD;  Location: [facility];  Service: Urology;  Laterality: N/A;   INCONTINENCE SURGERY  2009  &  2005   RADIOACTIVE SEED GUIDED PARTIAL MASTECTOMY WITH AXILLARY SENTINEL LYMPH NODE BIOPSY Right 05/18/2016   Procedure: RADIOACTIVE SEED GUIDED RIGHT BREAST LUMPECTOMY WITH AXILLARY SENTINEL LYMPH NODE BIOPSY;  Surgeon: Cough Ebbie, MD;  Location: [facility];  Service: General;  Laterality: Right;  RADIOACTIVE SEED GUIDED RIGHT BREAST LUMPECTOMY WITH AXILLARY SENTINEL LYMPH NODE BIOPSY   TRANSTHORACIC ECHOCARDIOGRAM  06-16-2013   GRADE I DIASTOLIC DYSFUNCTION/  EF 55-60%/  MILD AR/  TRIVIAL MR  &  TR  VAGINAL HYSTERECTOMY  2000   w/  unilateral salpingoophorectomy   VEIN BYPASS SURGERY  AGE 32   RIGHT LEG    Family History  Problem Relation Age of Onset   Diabetes Mother    Diverticulitis Mother    Intestinal polyp Sister    Diverticulitis Sister    Lung cancer Brother    High blood pressure Brother    Liver disease Neg Hx    Colon cancer Neg Hx    Esophageal cancer Neg Hx     Social History   Socioeconomic History   Marital status: Married    Spouse name: Not on file   Number of children: 2   Years of education: Not on file   Highest education level: Not on file  Occupational History   Occupation: retired  Tobacco Use   Smoking status: Former    Current packs/day: 0.00    Types: Cigarettes    Start date: 11/28/1975    Quit date: 11/28/1995    Years since quitting: 28.9   Smokeless tobacco: Current    Types: Snuff   Tobacco comments:    dips daily  Vaping Use   Vaping status: Never Used  Substance and Sexual  Activity   Alcohol use: No   Drug use: No   Sexual activity: Not on file  Other Topics Concern   Not on file  Social History Narrative   Not on file   Social Drivers of Health   Tobacco Use: High Risk (11/11/2024)   Patient History    Smoking Tobacco Use: Former    Smokeless Tobacco Use: Current    Passive Exposure: Not on Actuary Strain: Low Risk (04/03/2024)   Overall Financial Resource Strain (CARDIA)    Difficulty of Paying Living Expenses: Not hard at all  Food Insecurity: No Food Insecurity (04/03/2024)   Hunger Vital Sign    Worried About Running Out of Food in the Last Year: Never true    Ran Out of Food in the Last Year: Never true  Transportation Needs: No Transportation Needs (04/03/2024)   PRAPARE - Administrator, Civil Service (Medical): No    Lack of Transportation (Non-Medical): No  Physical Activity: Sufficiently Active (04/03/2024)   Exercise Vital Sign    Days of Exercise per Week: 4 days    Minutes of Exercise per Session: 60 min  Recent Concern: Physical Activity - Inactive (02/21/2024)   Exercise Vital Sign    Days of Exercise per Week: 0 days    Minutes of Exercise per Session: 0 min  Stress: No Stress Concern Present (04/03/2024)   [employer] of Occupational Health - Occupational Stress Questionnaire    Feeling of Stress : Only a little  Social Connections: Moderately Integrated (04/03/2024)   Social Connection and Isolation Panel    Frequency of Communication with Friends and Family: More than three times a week    Frequency of Social Gatherings with Friends and Family: Three times a week    Attends Religious Services: More than 4 times per year    Active Member of Clubs or Organizations: No    Attends Banker Meetings: Never    Marital Status: Married  Catering Manager Violence: Not At Risk (04/03/2024)   Humiliation, Afraid, Rape, and Kick questionnaire    Fear of Current or Ex-Partner: No    Emotionally  Abused: No    Physically Abused: No    Sexually Abused: No  Depression (PHQ2-9): Low  Risk (08/07/2024)   Depression (PHQ2-9)    PHQ-2 Score: 0  Alcohol Screen: Low Risk (04/03/2024)   Alcohol Screen    Last Alcohol Screening Score (AUDIT): 0  Housing: Low Risk (04/03/2024)   Housing Stability Vital Sign    Unable to Pay for Housing in the Last Year: No    Number of Times Moved in the Last Year: 0    Homeless in the Last Year: No  Utilities: Not At Risk (04/03/2024)   AHC Utilities    Threatened with loss of utilities: No  Health Literacy: Adequate Health Literacy (04/03/2024)   B1300 Health Literacy    Frequency of need for help with medical instructions: Never    Review of Systems:  All systems reviewed.  They are negative to the above problem except as previously stated.  Vital Signs: BP 130/70   Pulse 65   Ht 5' 3 (1.6 m)   Wt 175 lb (79.4 kg)   SpO2 98%   BMI 31.00 kg/m   Physical Exam Obese 68 yo in NAD   Neck: JVP is normal.  No bruits.  Lungs: clear to auscultation.  Heart: RRR  No murmurs    Abdomen:  Supple, nontender.No masses. No hepatomegaly.  Extremities:   Good distal pulses throughout. No lower extremity edema.  .  EKG  NSR  65 bpm Assessment and Plan:  1  Dizziness /presyncope  Anderson with severe spells early in fall, none since after COVID Wore a monitor which showed afib   ONe bad spell occurred in early October  On 10/3 had afib with RVR in later afternoon  Not triggered   Anderson can't remember if this was the day   Will ask husband when went to fair Follow   2 PAF  Very short spells on monitor   Denies symptosm since COVID    Will get echo to evaluate atria size, LV size/function Not on anticoagulation  now but will follow closely given risk factors (age/female/HTN)  3 Dyspnea on exertion / fatigue   COncerning  ? Angina  WIll set up for CCTA     4  HTN  BP controlled    4 HL Anderson is rx with Crestor    LDL 48  HDK 54 in Nov 2024  Will recheck  5  DM  REcheck A1C and insulin   P  6  Snoring  Signif snoring   With PAF will set up for [facility] sleep study   7 Thyroid   On supplement  Will check TSH

## 2024-11-11 ENCOUNTER — Encounter: Payer: Self-pay | Admitting: Internal Medicine

## 2024-11-11 ENCOUNTER — Ambulatory Visit: Attending: Internal Medicine | Admitting: Internal Medicine

## 2024-11-11 VITALS — BP 130/70 | HR 65 | Ht 63.0 in | Wt 175.0 lb

## 2024-11-11 DIAGNOSIS — R55 Syncope and collapse: Secondary | ICD-10-CM | POA: Diagnosis not present

## 2024-11-11 DIAGNOSIS — R072 Precordial pain: Secondary | ICD-10-CM | POA: Diagnosis not present

## 2024-11-11 DIAGNOSIS — I4891 Unspecified atrial fibrillation: Secondary | ICD-10-CM | POA: Diagnosis not present

## 2024-11-11 DIAGNOSIS — Z79899 Other long term (current) drug therapy: Secondary | ICD-10-CM | POA: Diagnosis not present

## 2024-11-11 LAB — BASIC METABOLIC PANEL WITH GFR
BUN/Creatinine Ratio: 17 (ref 12–28)
BUN: 10 mg/dL (ref 8–27)
CO2: 24 mmol/L (ref 20–29)
Calcium: 9.2 mg/dL (ref 8.7–10.3)
Chloride: 106 mmol/L (ref 96–106)
Creatinine, Ser: 0.6 mg/dL (ref 0.57–1.00)
Glucose: 96 mg/dL (ref 70–99)
Potassium: 4.3 mmol/L (ref 3.5–5.2)
Sodium: 144 mmol/L (ref 134–144)
eGFR: 99 mL/min/1.73 (ref >59)

## 2024-11-11 NOTE — Patient Instructions (Addendum)
 Medication Instructions:  Your physician recommends that you continue on your current medications as directed. Please refer to the Current Medication list given to you today.  *If you need a refill on your cardiac medications before your next appointment, please call your pharmacy*  Lab Work: CBC, BMET, Hemoglobin A1-C, TSH, NMR panel, Lpa and Fasting Insulin   If you have labs (blood work) drawn today and your tests are completely normal, you will receive your results only by: MyChart Message (if you have MyChart) OR A paper copy in the mail If you have any lab test that is abnormal or we need to change your treatment, we will call you to review the results.  Testing/Procedures:  Your physician has requested that you have an echocardiogram. Echocardiography is a painless test that uses sound waves to create images of your heart. It provides your doctor with information about the size and shape of your heart and how well your hearts chambers and valves are working. This procedure takes approximately one hour. There are no restrictions for this procedure. Please do NOT wear cologne, perfume, aftershave, or lotions (deodorant is allowed). Please arrive 15 minutes prior to your appointment time.  Please note: We ask at that you not bring children with you during ultrasound (echo/ vascular) testing. Due to room size and safety concerns, children are not allowed in the ultrasound rooms during exams. Our front office staff cannot provide observation of children in our lobby area while testing is being conducted. An adult accompanying a patient to their appointment will only be allowed in the ultrasound room at the discretion of the ultrasound technician under special circumstances. We apologize for any inconvenience.  Your physician has recommended that you have a sleep study. This test records several body functions during sleep, including: brain activity, eye movement, oxygen and carbon dioxide  blood levels, heart rate and rhythm, breathing rate and rhythm, the flow of air through your mouth and nose, snoring, body muscle movements, and chest and belly movement.   Follow-Up: At [facility], you and your health needs are our priority.  As part of our continuing mission to provide you with exceptional heart care, our providers are all part of one team.  This team includes your primary Cardiologist (physician) and Advanced Practice Providers or APPs (Physician Assistants and Nurse Practitioners) who all work together to provide you with the care you need, when you need it.  Your next appointment:   To be determined  We recommend signing up for the patient portal called MyChart.  Sign up information is provided on this After Visit Summary.  MyChart is used to connect with patients for Virtual Visits (Telemedicine).  Patients are able to view lab/test results, encounter notes, upcoming appointments, etc.  Non-urgent messages can be sent to your provider as well.   To learn more about what you can do with MyChart, go to [email or web address].   Other Instructions   Your cardiac CT will be scheduled at one of the below locations:     [facility] [address]  [place], [place] [address]    If scheduled at the Heart and Vascular Tower at [address], please enter the parking lot using the [address] entrance and use the FREE valet service at the patient drop-off area. Enter the building and check-in with registration on the main floor.    Please follow these instructions carefully (unless otherwise directed):  An IV will be required for this test and  Nitroglycerin will be given.  Hold all erectile dysfunction medications at least 3 days (72 hrs) prior to test. (Ie viagra, cialis, sildenafil, tadalafil, etc)   On the Night Before the Test: Be sure to Drink plenty of water . Do not consume any caffeinated/decaffeinated beverages or  chocolate 12 hours prior to your test. Do not take any antihistamines 12 hours prior to your test.  On the Day of the Test: Drink plenty of water  until 1 hour prior to the test. Do not eat any food 1 hour prior to test. You may take your regular medications prior to the test.  Take metoprolol (Lopressor) two hours prior to test. If you take Furosemide/Hydrochlorothiazide /Spironolactone/Chlorthalidone, please HOLD on the morning of the test. Patients who wear a continuous glucose monitor MUST remove the device prior to scanning. FEMALES- please wear underwire-free bra if available, avoid dresses & tight clothing        After the Test: Drink plenty of water . After receiving IV contrast, you may experience a mild flushed feeling. This is normal. On occasion, you may experience a mild rash up to 24 hours after the test. This is not dangerous. If this occurs, you can take Benadryl  25 mg, Zyrtec, Claritin , or Allegra and increase your fluid intake. (Patients taking Tikosyn should avoid Benadryl , and may take Zyrtec, Claritin , or Allegra) If you experience trouble breathing, this can be serious. If it is severe call 911 IMMEDIATELY. If it is mild, please call our office.  We will call to schedule your test 2-4 weeks out understanding that some insurance companies will need an authorization prior to the service being performed.   For more information and frequently asked questions, please visit our website : [email or web address]  For non-scheduling related questions, please contact the cardiac imaging nurse navigator should you have any questions/concerns: Cardiac Imaging Nurse Navigators Direct Office Dial: [phone]   For scheduling needs, including cancellations and rescheduling, please call Brittany, [phone].

## 2024-11-13 ENCOUNTER — Ambulatory Visit: Payer: Self-pay | Admitting: Internal Medicine

## 2024-11-19 LAB — NMR, LIPOPROFILE
Cholesterol, Total: 129 mg/dL (ref 100–199)
HDL Particle Number: 35.3 umol/L
HDL-C: 51 mg/dL
LDL Particle Number: 755 nmol/L
LDL Size: 20.4 nm — ABNORMAL LOW
LDL-C (NIH Calc): 61 mg/dL (ref 0–99)
LP-IR Score: 62 — ABNORMAL HIGH
Small LDL Particle Number: 439 nmol/L
Triglycerides: 92 mg/dL (ref 0–149)

## 2024-11-19 LAB — HEMOGLOBIN A1C
Est. average glucose Bld gHb Est-mCnc: 126 mg/dL
Hgb A1c MFr Bld: 6 % — ABNORMAL HIGH (ref 4.8–5.6)

## 2024-11-19 LAB — CBC
Hematocrit: 43.8 % (ref 34.0–46.6)
Hemoglobin: 14 g/dL (ref 11.1–15.9)
MCH: 28 pg (ref 26.6–33.0)
MCHC: 32 g/dL (ref 31.5–35.7)
MCV: 88 fL (ref 79–97)
Platelets: 301 x10E3/uL (ref 150–450)
RBC: 5 x10E6/uL (ref 3.77–5.28)
RDW: 12.7 % (ref 11.7–15.4)
WBC: 10.7 x10E3/uL (ref 3.4–10.8)

## 2024-11-19 LAB — INSULIN, FREE AND TOTAL
Free Insulin: 21 uU/mL — ABNORMAL HIGH
Total Insulin: 21 uU/mL

## 2024-11-19 LAB — LIPOPROTEIN A (LPA): Lipoprotein (a): 215.3 nmol/L — ABNORMAL HIGH

## 2024-11-19 LAB — TSH: TSH: 0.069 u[IU]/mL — ABNORMAL LOW (ref 0.450–4.500)

## 2024-11-24 ENCOUNTER — Telehealth (HOSPITAL_COMMUNITY): Payer: Self-pay | Admitting: *Deleted

## 2024-11-24 NOTE — Telephone Encounter (Signed)
 Reaching out to patient to offer assistance regarding upcoming cardiac imaging study; pt verbalizes understanding of appt date/time, parking situation and where to check in, pre-test NPO status, and verified current allergies; name and call back number provided for further questions should they arise  Chantal Requena RN Navigator Cardiac Imaging [facility] [phone] office [phone] cell  Patient states she has contrast before and doesn't recall having any allergic reactions.

## 2024-11-24 NOTE — Telephone Encounter (Signed)
 Attempted to call patient regarding upcoming cardiac CT appointment. Left message on voicemail with name and callback number Sid Seats RN Navigator Cardiac Imaging [facility] [phone] Office

## 2024-11-25 ENCOUNTER — Ambulatory Visit (HOSPITAL_COMMUNITY)
Admission: RE | Admit: 2024-11-25 | Discharge: 2024-11-25 | Disposition: A | Discharge status: Home / Self Care | Source: Ambulatory Visit | Attending: Internal Medicine | Admitting: Internal Medicine

## 2024-11-25 DIAGNOSIS — R072 Precordial pain: Secondary | ICD-10-CM | POA: Diagnosis not present

## 2024-11-25 MED ORDER — METOPROLOL TARTRATE 5 MG/5ML IV SOLN
10.0000 mg | INTRAVENOUS | Status: DC | PRN
  Administered 2024-11-25: 10 mg via INTRAVENOUS

## 2024-11-25 MED ORDER — NITROGLYCERIN 0.4 MG SL SUBL
0.8000 mg | SUBLINGUAL_TABLET | Freq: Once | SUBLINGUAL | Status: AC
  Administered 2024-11-25: 0.8 mg via SUBLINGUAL

## 2024-11-25 MED ORDER — DILTIAZEM HCL 25 MG/5ML IV SOLN: 10.0000 mg | INTRAVENOUS | Status: DC | PRN

## 2024-11-25 MED ORDER — IOHEXOL 350 MG/ML SOLN
100.0000 mL | Freq: Once | INTRAVENOUS | Status: AC | PRN
  Administered 2024-11-25: 100 mL via INTRAVENOUS

## 2024-11-28 NOTE — Telephone Encounter (Signed)
 S/w the patient and went over results. She verbalized understanding.

## 2024-11-30 ENCOUNTER — Encounter (HOSPITAL_BASED_OUTPATIENT_CLINIC_OR_DEPARTMENT_OTHER): Payer: Self-pay | Admitting: Cardiology

## 2024-11-30 DIAGNOSIS — G4733 Obstructive sleep apnea (adult) (pediatric): Secondary | ICD-10-CM

## 2024-12-02 NOTE — Procedures (Signed)
" ° ° ° °  SLEEP STUDY REPORT Patient Information Study Date: 11/30/2024 Patient Name: Desiree Anderson Patient ID: [id number] Birth Date: 05/14/1956 Age: 69 Gender: Female BMI: 30.9 (W=174 lb, H=5' 3'') Referring Physician: Vina Gull, MD  TEST DESCRIPTION: Home sleep apnea testing was completed using the WatchPat, a Type 1 device, utilizing  peripheral arterial tonometry (PAT), chest movement, actigraphy, pulse oximetry, pulse rate, body position and snore.  AHI was calculated with apnea and hypopnea using valid sleep time as the denominator. RDI includes apneas,  hypopneas, and RERAs. The data acquired and the scoring of sleep and all associated events were performed in  accordance with the recommended standards and specifications as outlined in the AASM Manual for the Scoring of  Sleep and Associated Events 2.2.0 (2015).   FINDINGS:   1. Mild Obstructive Sleep Apnea with AHI 10.5/h overall and moderate during REM sleep with REM AHI 28.8/hr.   2. No Central Sleep Apnea with pAHIc 0/hr.   3. Oxygen desaturations as low as 76%.   4. Severe snoring was present. O2 sats were < 88% for 19.2 min.   5. Total sleep time was 8 hrs and 58 min.   6. 34.6% of total sleep time was spent in REM sleep.   7. sleep onset latency at 22 min.   8. REM sleep onset latency at 80 min.   9. Total awakenings were 2.  10. Arrhythmia detection: None  DIAGNOSIS: Mild Obstructive Sleep Apnea (G47.33) overall and Moderate during REM sleep Nocturnal Hypoxemia  RECOMMENDATIONS: 1. Clinical correlation of these findings is necessary. The decision to treat obstructive sleep apnea (OSA) is usually  based on the presence of apnea symptoms or the presence of associated medical conditions such as Hypertension,  Congestive Heart Failure, Atrial Fibrillation or Obesity. The most common symptoms of OSA are snoring, gasping for  breath while sleeping, daytime sleepiness and fatigue.  2. Initiating apnea therapy is  recommended given the presence of symptoms and/or associated conditions.  Recommend proceeding with one of the following:  a. Auto-CPAP therapy with a pressure range of 5-20cm H2O.  b. An oral appliance (OA) that can be obtained from certain dentists with expertise in sleep medicine. These are  primarily of use in non-obese patients with mild and moderate disease.  c. An ENT consultation which may be useful to look for specific causes of obstruction and possible treatment  options.  d. If patient is intolerant to PAP therapy, consider referral to ENT for evaluation for hypoglossal nerve stimulator.  3. Close follow-up is necessary to ensure success with CPAP or oral appliance therapy for maximum benefit . 4. A follow-up oximetry study on CPAP is recommended to assess the adequacy of therapy and determine the need  for supplemental oxygen or the potential need for Bi-level therapy. An arterial blood gas to determine the adequacy of  baseline ventilation and oxygenation should also be considered. 5. Healthy sleep recommendations include: adequate nightly sleep (normal 7-9 hrs/night), avoidance of caffeine after  noon and alcohol near bedtime, and maintaining a sleep environment that is cool, dark and quiet. 6. Weight loss for overweight patients is recommended. Even modest amounts of weight loss can significantly  improve the severity of sleep apnea. 7. Snoring recommendations include: weight loss where appropriate, side sleeping, and avoidance of alcohol before  bed. 8. Operation of motor vehicle should be avoided when sleepy.  Signature: Wilbert Bihari, MD; [facility]; Diplomat, American Board of Sleep  Medicine Electronically Signed: 12/02/2024 10:33:43 AM  "

## 2024-12-22 ENCOUNTER — Ambulatory Visit (HOSPITAL_COMMUNITY)

## 2024-12-29 ENCOUNTER — Other Ambulatory Visit: Payer: Self-pay | Admitting: Family Medicine

## 2024-12-29 DIAGNOSIS — E66811 Obesity, class 1: Secondary | ICD-10-CM

## 2025-01-22 ENCOUNTER — Ambulatory Visit (HOSPITAL_COMMUNITY)

## 2025-02-10 ENCOUNTER — Ambulatory Visit: Admitting: Internal Medicine

## 2025-03-09 ENCOUNTER — Ambulatory Visit: Admitting: Family Medicine

## 2025-04-16 ENCOUNTER — Ambulatory Visit
